# Patient Record
Sex: Female | Born: 1955
Health system: Southern US, Community
[De-identification: ages and names within clinical notes are randomized; demographics above are authoritative.]

## PROBLEM LIST (undated history)

## (undated) DIAGNOSIS — F329 Major depressive disorder, single episode, unspecified: Secondary | ICD-10-CM

## (undated) DIAGNOSIS — K219 Gastro-esophageal reflux disease without esophagitis: Secondary | ICD-10-CM

## (undated) DIAGNOSIS — I509 Heart failure, unspecified: Secondary | ICD-10-CM

## (undated) DIAGNOSIS — E785 Hyperlipidemia, unspecified: Secondary | ICD-10-CM

## (undated) DIAGNOSIS — G8929 Other chronic pain: Secondary | ICD-10-CM

## (undated) DIAGNOSIS — I2510 Atherosclerotic heart disease of native coronary artery without angina pectoris: Secondary | ICD-10-CM

## (undated) DIAGNOSIS — Z720 Tobacco use: Secondary | ICD-10-CM

## (undated) DIAGNOSIS — D649 Anemia, unspecified: Secondary | ICD-10-CM

## (undated) DIAGNOSIS — Z9981 Dependence on supplemental oxygen: Secondary | ICD-10-CM

## (undated) DIAGNOSIS — C55 Malignant neoplasm of uterus, part unspecified: Secondary | ICD-10-CM

## (undated) DIAGNOSIS — J40 Bronchitis, not specified as acute or chronic: Secondary | ICD-10-CM

## (undated) DIAGNOSIS — I429 Cardiomyopathy, unspecified: Secondary | ICD-10-CM

## (undated) DIAGNOSIS — R55 Syncope and collapse: Secondary | ICD-10-CM

## (undated) DIAGNOSIS — I739 Peripheral vascular disease, unspecified: Secondary | ICD-10-CM

## (undated) DIAGNOSIS — I10 Essential (primary) hypertension: Secondary | ICD-10-CM

## (undated) DIAGNOSIS — J189 Pneumonia, unspecified organism: Secondary | ICD-10-CM

## (undated) DIAGNOSIS — E039 Hypothyroidism, unspecified: Secondary | ICD-10-CM

## (undated) DIAGNOSIS — Z9889 Other specified postprocedural states: Secondary | ICD-10-CM

## (undated) DIAGNOSIS — F419 Anxiety disorder, unspecified: Secondary | ICD-10-CM

## (undated) DIAGNOSIS — G43909 Migraine, unspecified, not intractable, without status migrainosus: Secondary | ICD-10-CM

## (undated) DIAGNOSIS — J449 Chronic obstructive pulmonary disease, unspecified: Secondary | ICD-10-CM

## (undated) DIAGNOSIS — C801 Malignant (primary) neoplasm, unspecified: Secondary | ICD-10-CM

## (undated) DIAGNOSIS — F32A Depression, unspecified: Secondary | ICD-10-CM

## (undated) HISTORY — PX: NASAL SINUS SURGERY: SHX719

## (undated) HISTORY — DX: Depression, unspecified: F32.A

## (undated) HISTORY — DX: Syncope and collapse: R55

## (undated) HISTORY — DX: Tobacco use: Z72.0

## (undated) HISTORY — DX: Hyperlipidemia, unspecified: E78.5

## (undated) HISTORY — PX: CARPAL TUNNEL RELEASE: SHX101

## (undated) HISTORY — DX: Gastro-esophageal reflux disease without esophagitis: K21.9

## (undated) HISTORY — PX: DILATION AND CURETTAGE OF UTERUS: SHX78

## (undated) HISTORY — PX: LUMBAR LAMINECTOMY: SHX95

## (undated) HISTORY — PX: LAPAROTOMY: SHX154

## (undated) HISTORY — PX: BACK SURGERY: SHX140

## (undated) HISTORY — DX: Essential (primary) hypertension: I10

## (undated) HISTORY — DX: Anxiety disorder, unspecified: F41.9

## (undated) HISTORY — DX: Malignant neoplasm of uterus, part unspecified: C55

## (undated) HISTORY — DX: Other chronic pain: G89.29

## (undated) HISTORY — DX: Peripheral vascular disease, unspecified: I73.9

## (undated) HISTORY — DX: Cardiomyopathy, unspecified: I42.9

## (undated) HISTORY — DX: Other specified postprocedural states: Z98.890

## (undated) HISTORY — DX: Hypothyroidism, unspecified: E03.9

## (undated) HISTORY — DX: Anemia, unspecified: D64.9

## (undated) HISTORY — DX: Heart failure, unspecified: I50.9

## (undated) HISTORY — DX: Malignant (primary) neoplasm, unspecified: C80.1

## (undated) HISTORY — DX: Major depressive disorder, single episode, unspecified: F32.9

## (undated) HISTORY — DX: Migraine, unspecified, not intractable, without status migrainosus: G43.909

## (undated) HISTORY — PX: ABDOMINAL HYSTERECTOMY: SHX81

---
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
## 1997-09-01 ENCOUNTER — Ambulatory Visit (HOSPITAL_COMMUNITY): Admission: RE | Admit: 1997-09-01 | Discharge: 1997-09-01 | Payer: Self-pay | Admitting: Neurological Surgery

## 1997-09-08 ENCOUNTER — Ambulatory Visit (HOSPITAL_COMMUNITY): Admission: RE | Admit: 1997-09-08 | Discharge: 1997-09-08 | Payer: Self-pay | Admitting: Neurological Surgery

## 1997-09-22 ENCOUNTER — Encounter: Payer: Self-pay | Admitting: Neurological Surgery

## 1997-09-22 ENCOUNTER — Ambulatory Visit (HOSPITAL_COMMUNITY): Admission: RE | Admit: 1997-09-22 | Discharge: 1997-09-22 | Payer: Self-pay | Admitting: Neurological Surgery

## 1999-01-03 ENCOUNTER — Ambulatory Visit (HOSPITAL_COMMUNITY): Admission: RE | Admit: 1999-01-03 | Discharge: 1999-01-03 | Payer: Self-pay | Admitting: Neurological Surgery

## 1999-01-03 ENCOUNTER — Encounter: Payer: Self-pay | Admitting: Neurological Surgery

## 2000-09-03 ENCOUNTER — Ambulatory Visit (HOSPITAL_COMMUNITY): Admission: RE | Admit: 2000-09-03 | Discharge: 2000-09-03 | Payer: Self-pay | Admitting: Internal Medicine

## 2001-11-28 ENCOUNTER — Encounter: Payer: Self-pay | Admitting: Neurological Surgery

## 2001-11-28 ENCOUNTER — Ambulatory Visit (HOSPITAL_COMMUNITY): Admission: RE | Admit: 2001-11-28 | Discharge: 2001-11-28 | Payer: Self-pay | Admitting: Neurological Surgery

## 2002-10-13 ENCOUNTER — Ambulatory Visit (HOSPITAL_COMMUNITY): Admission: RE | Admit: 2002-10-13 | Discharge: 2002-10-13 | Payer: Self-pay | Admitting: Internal Medicine

## 2002-10-13 ENCOUNTER — Encounter: Payer: Self-pay | Admitting: Internal Medicine

## 2003-03-28 ENCOUNTER — Ambulatory Visit (HOSPITAL_COMMUNITY): Admission: RE | Admit: 2003-03-28 | Discharge: 2003-03-28 | Payer: Self-pay | Admitting: Internal Medicine

## 2003-04-27 ENCOUNTER — Encounter (HOSPITAL_COMMUNITY): Admission: RE | Admit: 2003-04-27 | Discharge: 2003-05-27 | Payer: Self-pay | Admitting: Orthopedic Surgery

## 2003-05-30 ENCOUNTER — Ambulatory Visit (HOSPITAL_COMMUNITY): Admission: RE | Admit: 2003-05-30 | Discharge: 2003-05-30 | Payer: Self-pay | Admitting: Orthopedic Surgery

## 2003-07-22 ENCOUNTER — Ambulatory Visit (HOSPITAL_COMMUNITY): Admission: RE | Admit: 2003-07-22 | Discharge: 2003-07-22 | Payer: Self-pay | Admitting: Anesthesiology

## 2003-08-24 ENCOUNTER — Ambulatory Visit (HOSPITAL_COMMUNITY): Admission: RE | Admit: 2003-08-24 | Discharge: 2003-08-24 | Payer: Self-pay | Admitting: Anesthesiology

## 2004-11-21 ENCOUNTER — Ambulatory Visit (HOSPITAL_COMMUNITY): Admission: RE | Admit: 2004-11-21 | Discharge: 2004-11-21 | Payer: Self-pay | Admitting: Internal Medicine

## 2005-01-02 ENCOUNTER — Ambulatory Visit: Payer: Self-pay | Admitting: Psychiatry

## 2005-01-14 ENCOUNTER — Ambulatory Visit: Payer: Self-pay | Admitting: Psychiatry

## 2005-02-04 HISTORY — PX: CHOLECYSTECTOMY: SHX55

## 2005-04-01 ENCOUNTER — Emergency Department (HOSPITAL_COMMUNITY): Admission: EM | Admit: 2005-04-01 | Discharge: 2005-04-01 | Payer: Self-pay | Admitting: Emergency Medicine

## 2005-04-09 ENCOUNTER — Ambulatory Visit (HOSPITAL_COMMUNITY): Payer: Self-pay | Admitting: Psychiatry

## 2005-08-15 ENCOUNTER — Emergency Department (HOSPITAL_COMMUNITY): Admission: EM | Admit: 2005-08-15 | Discharge: 2005-08-15 | Payer: Self-pay | Admitting: Emergency Medicine

## 2005-11-13 ENCOUNTER — Encounter (INDEPENDENT_AMBULATORY_CARE_PROVIDER_SITE_OTHER): Payer: Self-pay | Admitting: *Deleted

## 2005-11-13 ENCOUNTER — Observation Stay (HOSPITAL_COMMUNITY): Admission: RE | Admit: 2005-11-13 | Discharge: 2005-11-14 | Payer: Self-pay | Admitting: General Surgery

## 2006-01-30 ENCOUNTER — Ambulatory Visit: Payer: Self-pay | Admitting: Gastroenterology

## 2006-03-20 ENCOUNTER — Ambulatory Visit (HOSPITAL_COMMUNITY): Admission: RE | Admit: 2006-03-20 | Discharge: 2006-03-20 | Payer: Self-pay | Admitting: Gastroenterology

## 2006-11-03 ENCOUNTER — Ambulatory Visit (HOSPITAL_COMMUNITY): Admission: RE | Admit: 2006-11-03 | Discharge: 2006-11-03 | Payer: Self-pay | Admitting: Internal Medicine

## 2006-11-11 ENCOUNTER — Ambulatory Visit (HOSPITAL_COMMUNITY): Admission: RE | Admit: 2006-11-11 | Discharge: 2006-11-11 | Payer: Self-pay | Admitting: Neurological Surgery

## 2006-11-11 ENCOUNTER — Encounter (INDEPENDENT_AMBULATORY_CARE_PROVIDER_SITE_OTHER): Payer: Self-pay | Admitting: Neurological Surgery

## 2006-11-11 ENCOUNTER — Ambulatory Visit: Payer: Self-pay | Admitting: Vascular Surgery

## 2006-11-18 ENCOUNTER — Emergency Department (HOSPITAL_COMMUNITY): Admission: EM | Admit: 2006-11-18 | Discharge: 2006-11-18 | Payer: Self-pay | Admitting: Emergency Medicine

## 2007-06-02 ENCOUNTER — Other Ambulatory Visit: Payer: Self-pay

## 2007-06-02 ENCOUNTER — Ambulatory Visit: Payer: Self-pay | Admitting: *Deleted

## 2007-06-02 ENCOUNTER — Inpatient Hospital Stay (HOSPITAL_COMMUNITY): Admission: RE | Admit: 2007-06-02 | Discharge: 2007-06-05 | Payer: Self-pay | Admitting: Psychiatry

## 2007-06-19 ENCOUNTER — Ambulatory Visit (HOSPITAL_COMMUNITY): Payer: Self-pay | Admitting: Endocrinology

## 2007-06-19 ENCOUNTER — Encounter (HOSPITAL_COMMUNITY): Admission: RE | Admit: 2007-06-19 | Discharge: 2007-07-19 | Payer: Self-pay | Admitting: Oncology

## 2007-09-02 ENCOUNTER — Ambulatory Visit (HOSPITAL_COMMUNITY): Admission: RE | Admit: 2007-09-02 | Discharge: 2007-09-02 | Payer: Self-pay | Admitting: Internal Medicine

## 2007-12-18 ENCOUNTER — Ambulatory Visit (HOSPITAL_COMMUNITY): Admission: RE | Admit: 2007-12-18 | Discharge: 2007-12-18 | Payer: Self-pay | Admitting: Internal Medicine

## 2008-03-23 ENCOUNTER — Ambulatory Visit (HOSPITAL_COMMUNITY): Admission: RE | Admit: 2008-03-23 | Discharge: 2008-03-23 | Payer: Self-pay | Admitting: Internal Medicine

## 2008-08-14 ENCOUNTER — Emergency Department (HOSPITAL_COMMUNITY): Admission: EM | Admit: 2008-08-14 | Discharge: 2008-08-14 | Payer: Self-pay | Admitting: Emergency Medicine

## 2008-08-23 ENCOUNTER — Emergency Department (HOSPITAL_COMMUNITY): Admission: EM | Admit: 2008-08-23 | Discharge: 2008-08-23 | Payer: Self-pay | Admitting: Emergency Medicine

## 2008-10-03 ENCOUNTER — Emergency Department (HOSPITAL_COMMUNITY): Admission: EM | Admit: 2008-10-03 | Discharge: 2008-10-03 | Payer: Self-pay | Admitting: Emergency Medicine

## 2010-04-20 ENCOUNTER — Ambulatory Visit: Payer: Self-pay | Admitting: Cardiology

## 2010-04-20 ENCOUNTER — Encounter: Payer: Self-pay | Admitting: Cardiology

## 2010-05-12 LAB — DIFFERENTIAL
Basophils Absolute: 0.1 10*3/uL (ref 0.0–0.1)
Basophils Relative: 1 % (ref 0–1)
Eosinophils Absolute: 0.2 10*3/uL (ref 0.0–0.7)
Eosinophils Relative: 2 % (ref 0–5)
Lymphocytes Relative: 24 % (ref 12–46)
Lymphs Abs: 2.5 10*3/uL (ref 0.7–4.0)
Monocytes Absolute: 0.9 10*3/uL (ref 0.1–1.0)
Monocytes Relative: 9 % (ref 3–12)
Neutro Abs: 6.8 10*3/uL (ref 1.7–7.7)
Neutrophils Relative %: 65 % (ref 43–77)

## 2010-05-12 LAB — URINALYSIS, ROUTINE W REFLEX MICROSCOPIC
Bilirubin Urine: NEGATIVE
Hgb urine dipstick: NEGATIVE
Ketones, ur: NEGATIVE mg/dL
Nitrite: NEGATIVE
Protein, ur: NEGATIVE mg/dL
Specific Gravity, Urine: 1.015 (ref 1.005–1.030)
Urobilinogen, UA: 0.2 mg/dL (ref 0.0–1.0)
pH: 6 (ref 5.0–8.0)

## 2010-05-12 LAB — BASIC METABOLIC PANEL
BUN: 8 mg/dL (ref 6–23)
CO2: 32 mEq/L (ref 19–32)
Calcium: 8.6 mg/dL (ref 8.4–10.5)
Chloride: 98 mEq/L (ref 96–112)
Creatinine, Ser: 0.9 mg/dL (ref 0.4–1.2)
GFR calc Af Amer: >60 mL/min (ref >60)
GFR calc non Af Amer: >60 mL/min (ref >60)
Glucose, Bld: 92 mg/dL (ref 70–99)
Potassium: 3.5 mEq/L (ref 3.5–5.1)
Sodium: 137 mEq/L (ref 135–145)

## 2010-05-12 LAB — RAPID URINE DRUG SCREEN, HOSP PERFORMED
Amphetamines: NOT DETECTED
Barbiturates: NOT DETECTED
Benzodiazepines: POSITIVE — AB
Cocaine: NOT DETECTED
Opiates: POSITIVE — AB
Tetrahydrocannabinol: NOT DETECTED

## 2010-05-12 LAB — PROTIME-INR
INR: 1 (ref 0.00–1.49)
Prothrombin Time: 13.1 seconds (ref 11.6–15.2)

## 2010-05-12 LAB — CBC
HCT: 33.2 % — ABNORMAL LOW (ref 36.0–46.0)
Hemoglobin: 11.8 g/dL — ABNORMAL LOW (ref 12.0–15.0)
MCHC: 35.5 g/dL (ref 30.0–36.0)
MCV: 93.3 fL (ref 78.0–100.0)
Platelets: 290 10*3/uL (ref 150–400)
RBC: 3.56 MIL/uL — ABNORMAL LOW (ref 3.87–5.11)
RDW: 12.8 % (ref 11.5–15.5)
WBC: 10.4 10*3/uL (ref 4.0–10.5)

## 2010-05-12 LAB — ETHANOL: Alcohol, Ethyl (B): <5 mg/dL (ref 0–10)

## 2010-05-13 LAB — RAPID URINE DRUG SCREEN, HOSP PERFORMED
Amphetamines: NOT DETECTED
Barbiturates: NOT DETECTED
Benzodiazepines: POSITIVE — AB
Cocaine: NOT DETECTED
Opiates: NOT DETECTED
Tetrahydrocannabinol: NOT DETECTED

## 2010-05-13 LAB — URINALYSIS, ROUTINE W REFLEX MICROSCOPIC
Bilirubin Urine: NEGATIVE
Glucose, UA: NEGATIVE mg/dL
Hgb urine dipstick: NEGATIVE
Ketones, ur: NEGATIVE mg/dL
Nitrite: NEGATIVE
Protein, ur: NEGATIVE mg/dL
Specific Gravity, Urine: 1.01 (ref 1.005–1.030)
pH: 5.5 (ref 5.0–8.0)

## 2010-05-13 LAB — DIFFERENTIAL
Basophils Absolute: 0.1 10*3/uL (ref 0.0–0.1)
Basophils Relative: 1 % (ref 0–1)
Eosinophils Absolute: 0.1 10*3/uL (ref 0.0–0.7)
Eosinophils Relative: 0 % (ref 0–5)
Lymphocytes Relative: 35 % (ref 12–46)
Lymphs Abs: 4.9 10*3/uL — ABNORMAL HIGH (ref 0.7–4.0)
Monocytes Absolute: 1.4 10*3/uL — ABNORMAL HIGH (ref 0.1–1.0)
Monocytes Relative: 10 % (ref 3–12)
Neutro Abs: 7.8 10*3/uL — ABNORMAL HIGH (ref 1.7–7.7)
Neutrophils Relative %: 55 % (ref 43–77)

## 2010-05-13 LAB — BASIC METABOLIC PANEL
BUN: 5 mg/dL — ABNORMAL LOW (ref 6–23)
CO2: 33 mEq/L — ABNORMAL HIGH (ref 19–32)
Calcium: 9.4 mg/dL (ref 8.4–10.5)
Chloride: 100 mEq/L (ref 96–112)
Creatinine, Ser: 0.9 mg/dL (ref 0.4–1.2)
GFR calc Af Amer: >60 mL/min (ref >60)
GFR calc non Af Amer: >60 mL/min (ref >60)
Glucose, Bld: 87 mg/dL (ref 70–99)
Potassium: 3.9 mEq/L (ref 3.5–5.1)
Sodium: 139 mEq/L (ref 135–145)

## 2010-05-13 LAB — CBC
HCT: 36.5 % (ref 36.0–46.0)
Hemoglobin: 12.8 g/dL (ref 12.0–15.0)
MCHC: 35 g/dL (ref 30.0–36.0)
MCV: 93.9 fL (ref 78.0–100.0)
Platelets: 350 10*3/uL (ref 150–400)
RBC: 3.89 MIL/uL (ref 3.87–5.11)
RDW: 14 % (ref 11.5–15.5)
WBC: 14.3 10*3/uL — ABNORMAL HIGH (ref 4.0–10.5)

## 2010-05-13 LAB — ETHANOL: Alcohol, Ethyl (B): <5 mg/dL (ref 0–10)

## 2010-05-14 ENCOUNTER — Ambulatory Visit (INDEPENDENT_AMBULATORY_CARE_PROVIDER_SITE_OTHER): Payer: Medicare Other | Admitting: Cardiology

## 2010-05-14 ENCOUNTER — Encounter: Payer: Self-pay | Admitting: *Deleted

## 2010-05-14 ENCOUNTER — Encounter: Payer: Self-pay | Admitting: Cardiology

## 2010-05-14 ENCOUNTER — Other Ambulatory Visit: Payer: Self-pay | Admitting: Cardiology

## 2010-05-14 DIAGNOSIS — Z720 Tobacco use: Secondary | ICD-10-CM

## 2010-05-14 DIAGNOSIS — R0989 Other specified symptoms and signs involving the circulatory and respiratory systems: Secondary | ICD-10-CM

## 2010-05-14 DIAGNOSIS — I700 Atherosclerosis of aorta: Secondary | ICD-10-CM

## 2010-05-14 DIAGNOSIS — I10 Essential (primary) hypertension: Secondary | ICD-10-CM

## 2010-05-14 DIAGNOSIS — E039 Hypothyroidism, unspecified: Secondary | ICD-10-CM

## 2010-05-14 DIAGNOSIS — E785 Hyperlipidemia, unspecified: Secondary | ICD-10-CM

## 2010-05-14 DIAGNOSIS — R55 Syncope and collapse: Secondary | ICD-10-CM | POA: Insufficient documentation

## 2010-05-14 DIAGNOSIS — F17210 Nicotine dependence, cigarettes, uncomplicated: Secondary | ICD-10-CM | POA: Insufficient documentation

## 2010-05-14 MED ORDER — FLUOXETINE HCL 20 MG PO TABS: 20.0000 mg | ORAL_TABLET | Freq: Every day | ORAL | Status: DC

## 2010-05-14 NOTE — Patient Instructions (Addendum)
Your physician recommends that you schedule a follow-up appointment in:1 month  Your physician has requested that you have a carotid duplex. This test is an ultrasound of the carotid arteries in your neck. It looks at blood flow through these arteries that supply the brain with blood. Allow one hour for this exam. There are no restrictions or special instructions.  Your physician has requested that you have an abdominal aorta duplex. During this test, an ultrasound is used to evaluate the aorta. Allow 30 minutes for this exam. Do not eat after midnight the day before and avoid carbonated beverages  Your physician recommends that you return for lab work in: next 1 month Your physician has recommended that you wear an event monitor. Event monitors are medical devices that record the heart's electrical activity. Doctors most often [place] these monitors to diagnose arrhythmias. Arrhythmias are problems with the speed or rhythm of the heartbeat. The monitor is a small, portable device. You can wear one while you do your normal daily activities. This is usually used to diagnose what is causing palpitations/syncope (passing out).   Your physician has recommended you make the following change in your medication: decrease prozac to 20mg  daily

## 2010-05-14 NOTE — Progress Notes (Signed)
HPI:  Ms. Seiler is seen at the kind request of Dr. Sherwood Gambler for evaluation of apparent episodes of syncope.  This nice woman has suffered from anxiety and depression for quite some time and was followed by a psychiatrist, but has not been seen by a behavioral health specialist for some time.  She experiences sudden falls with apparent loss of consciousness, typically without warning, although there is a suggestion of dizziness, at least some of the time.  She has not yet sustained significant injury.  Episodes occur on the average of perhaps once per week.  She associates spells with concomitant "panic attacks" and dates the onset as around the time of the death of her mother one year ago.  She has no known cardiovascular disease and does not recall any significant past cardiovascular testing.  Current Outpatient Prescriptions on File Prior to Visit  Medication Sig Dispense Refill  . ALPRAZolam (XANAX) 1 MG tablet Take 1 mg by mouth 4 (four) times daily.        Marland Kitchen levothyroxine (LEVOTHROID) 25 MCG tablet Take 25 mcg by mouth daily.        . Magnesium 250 MG TABS Take 1 tablet by mouth daily.        Marland Kitchen oxycodone (OXYCONTIN) 30 MG TB12 Take 30 mg by mouth every 12 (twelve) hours.        Marland Kitchen DISCONTD: FLUoxetine (PROZAC) 20 MG tablet Take 20 mg by mouth daily.       Marland Kitchen DISCONTD: mirtazapine (REMERON) 15 MG tablet Take 15 mg by mouth at bedtime.          Allergies  Allergen Reactions  . Aspirin   . Celecoxib   . Latex   . Tylenol-Codeine       Past Medical History  Diagnosis Date  . Near syncope   . Anxiety and depression   . Hyperlipidemia   . Hypertension   . Hypothyroid      Past Surgical History  Procedure Date  . Cholecystectomy 2007  . Abdominal hysterectomy   . Nasal sinus surgery   . Dilation and curettage of uterus   . Lumbar laminectomy x3    Left; complicated by neurologic dysfunction  . Laparotomy      History reviewed. No pertinent family history.   History   Social  History  . Marital Status: Legally Separated    Spouse Name: N/A    Number of Children: 2  . Years of Education: N/A   Occupational History  . Not on file.   Social History Main Topics  . Smoking status: Current Everyday Smoker  . Smokeless tobacco: Never Used  . Alcohol Use: No  . Drug Use: No  . Sexually Active:    Other Topics Concern  . Not on file   Social History Narrative  . No narrative on file     ROS:  Frequent and intermittently severe headaches; corrective lenses required for near vision; upper and lower dentures; intermittent mild palpitations; diffuse arthritic discomfort; intermittent mild pedal edema.  All other systems reviewed and are negative.  PHYSICAL EXAM: BP 89/59  Pulse 74  Ht 5\' 2"  (1.575 m)  Wt 131 lb (59.421 kg)  BMI 23.96 kg/m2  SpO2 94%  No consistent orthostatic change in blood pressure General-Well-developed; no acute distress HEENT-[place]/AT; PERRL; EOM intact; conjunctiva and lids nl Neck-No JVD; no carotid bruits Endocrine-No thyromegaly Lungs-No tachypnea, clear without rales, rhonchi or wheezes Cardiovascular- normal PMI; normal S1 and S2 Abdomen-BS normal; soft and  non-tender without masses or organomegaly Musculoskeletal-No deformities, cyanosis or clubbing Neurologic-Tremulous; nl cranial nerves; symmetric strength and tone Skin- Warm, no sig. Lesions Extremities-Nl distal pulses; no edema  EKG:   Normal sinus rhythm; delayed R wave progression-cannot exclude prior anteroseptal MI; no previous tracing for comparison.  ASSESSMENT AND PLAN:

## 2010-05-14 NOTE — Assessment & Plan Note (Signed)
No recent lipid profile is available for review; one will be ordered.

## 2010-05-14 NOTE — Assessment & Plan Note (Signed)
Association of episodes of apparent loss of consciousness with episodes of severe anxiety including palpitations raises the question of a psychogenic cause, although arrhythmia is certainly possible.  Blood pressure is quite low at this visit, an additional possible etiology.  Dose of fluoxetine will be decreased as noted above with reassessment orthostatic blood pressures and 3 days.  Patient will carry an event recorder for 3 weeks and return to see me in one month.

## 2010-05-14 NOTE — Assessment & Plan Note (Signed)
Despite a history of hypertension, blood pressure is on the low side in the absence of antihypertensive medication.  Fluoxetine can cause orthostatic hypotension; dosage will be reduced to 20 mg q.d. For the time being.

## 2010-05-14 NOTE — Assessment & Plan Note (Signed)
Patient advised to discontinue cigarette smoking completely.

## 2010-05-21 ENCOUNTER — Ambulatory Visit (INDEPENDENT_AMBULATORY_CARE_PROVIDER_SITE_OTHER): Payer: Medicare Other

## 2010-05-21 ENCOUNTER — Ambulatory Visit (HOSPITAL_COMMUNITY)
Admission: RE | Admit: 2010-05-21 | Discharge: 2010-05-21 | Disposition: A | Discharge status: Home / Self Care | Payer: Medicare Other | Source: Ambulatory Visit | Attending: Cardiology | Admitting: Cardiology

## 2010-05-21 DIAGNOSIS — R55 Syncope and collapse: Secondary | ICD-10-CM

## 2010-05-21 DIAGNOSIS — R0989 Other specified symptoms and signs involving the circulatory and respiratory systems: Secondary | ICD-10-CM

## 2010-05-21 DIAGNOSIS — E785 Hyperlipidemia, unspecified: Secondary | ICD-10-CM

## 2010-05-21 DIAGNOSIS — F17200 Nicotine dependence, unspecified, uncomplicated: Secondary | ICD-10-CM | POA: Insufficient documentation

## 2010-05-21 DIAGNOSIS — R42 Dizziness and giddiness: Secondary | ICD-10-CM | POA: Insufficient documentation

## 2010-05-21 NOTE — Progress Notes (Signed)
**Note De-Identified  Obfuscation** S: Pt. arrives in office for a 1 week BP check/nurse visit B: On last OV with Dr. Dietrich Pates on 05-14-10 pt. was advised to decrease Fluoxetine from 80mg   to 20mg  daily, to have a carotid U.S.,  Abdominal Aorta duplex and wear event monitor for 21 days. A: Pt. decreased dose of Fluoxetine to 40mg  (not 20mg  as advised). Her BP today is 92/64, at last OV BP = 89/59. She c/o dizziness (orthostatic BP recorded in chart).  R: Pt. Advised to decrease Fluoxetine to 20mg  daily and that we will call her with Dr. Marvel Plan recommendations, if any.  05/26/10 Recheck BP and symptoms in 2 weeks--no charge.  Solano Bing, M.D.

## 2010-05-24 DIAGNOSIS — R55 Syncope and collapse: Secondary | ICD-10-CM

## 2010-05-27 ENCOUNTER — Encounter: Payer: Self-pay | Admitting: *Deleted

## 2010-05-28 ENCOUNTER — Telehealth: Payer: Self-pay

## 2010-05-28 NOTE — Progress Notes (Signed)
Pt. has f/u appt. scheduled with Dr. Dietrich Pates on 06-13-10 @ 2:45. She states that if she has to come to town for any reason between now and then she will stop by office to have BP checked.

## 2010-05-30 ENCOUNTER — Encounter: Payer: Self-pay | Admitting: *Deleted

## 2010-06-06 ENCOUNTER — Telehealth: Payer: Self-pay | Admitting: Cardiology

## 2010-06-06 NOTE — Telephone Encounter (Signed)
Patient states that she is allergic to adhesive on patches for event monitor / states that she was advised by [employer] to take monitor off and call nurse Knute Neu

## 2010-06-06 NOTE — Telephone Encounter (Addendum)
S: pt is having allergic reaction to the patches, advised by [employer] to call our office for advise      Pt was given the sensitive skin patches but it did not improve. B:pt was baselined on 05/24/10 for a 21 day mcot monitor dx: syncope A: tracing are in your box R: Per Dr. Dietrich Pates, pt may discontinue heart  monitor, we have enough tracing for him to make a dx  Spoke with pt to return monitor, cardiology has enough tracing.

## 2010-06-13 ENCOUNTER — Other Ambulatory Visit: Payer: Self-pay | Admitting: Cardiology

## 2010-06-13 ENCOUNTER — Ambulatory Visit: Payer: Medicare Other | Admitting: Cardiology

## 2010-06-13 DIAGNOSIS — R52 Pain, unspecified: Secondary | ICD-10-CM

## 2010-06-13 DIAGNOSIS — R0989 Other specified symptoms and signs involving the circulatory and respiratory systems: Secondary | ICD-10-CM

## 2010-06-19 ENCOUNTER — Ambulatory Visit (HOSPITAL_COMMUNITY)
Admission: RE | Admit: 2010-06-19 | Discharge: 2010-06-19 | Payer: Medicare Other | Source: Ambulatory Visit | Attending: Cardiology | Admitting: Cardiology

## 2010-06-19 NOTE — Discharge Summary (Signed)
[name], [name]             ACCOUNT NO.:  [id number]   MEDICAL RECORD NO.:  [id number]          PATIENT TYPE:  IPS   LOCATION:  0301                          FACILITY:  [facility]   PHYSICIAN:  Jasmine Pang, M.D. DATE OF BIRTH:  04-Jan-1956   DATE OF ADMISSION:  06/02/2007  DATE OF DISCHARGE:  06/05/2007                               DISCHARGE SUMMARY   IDENTIFICATION:  This is the 55 year old married  and separated white  female who was admitted on a voluntary basis.   HISTORY OF PRESENT ILLNESS:  The patient presented with suicidal  thoughts of possibly running into a tree with her car.  She had fleeting  thoughts of harming her adult daughter.  She has chronic conflict with  her daughter and husband.  She denies actual intent to harm herself or  anyone else.  Her appetite has been decreased for the past 3 weeks.  Her  sleep has been poor.  Thoughts are racing.   PAST PSYCHIATRIC HISTORY:  The patient sees Dr. Delight Smith of the [facility].  This is her first [facility] admission for the  patient.  She has a history of an OD at age 35 on pills.   MEDICAL HISTORY:  History of multiple back surgeries.   MEDICATIONS:  1. Alprazolam 1 mg q.i.d.  2. Fluoxetine 40 mg q.a.m.  3. Albuterol 2 mg p.o. t.i.d.  4. Oxycodone 30 mg q.i.d.  5. Levothyroxine 25 mcg daily.  6. Nexium 20 mg daily.   DRUG ALLERGIES:  No known drug allergies.   PHYSICAL FINDINGS:  Physical exam was done in the emergency department.  There were no acute physical or medical problems noted.   ADMISSION LABORATORIES:  The CBC was grossly within normal limits.  Chemistry panel was within normal limits with a calcium of 9.7.  Alcohol  level was less than 5.  UDS was positive for benzodiazepines.   HOSPITAL COURSE:  Upon admission, the patient was restarted on  fluoxetine 40 mg daily, albuterol sulfate 2 mg p.o. t.i.d., oxycodone 30  mg p.o. q.i.d. p.r.n., levothyroxine 25 mcg p.o. q.day,  omeprazole 20 mg  p.o. q.day.  The patient was also started on alprazolam 1 mg p.o. q.i.d.  p.r.n. anxiety.  The patient had a rash on chest, which was treated with  some Benadryl 50 mg and triamcinolone 1% cream b.i.d.  The patient  tolerated her medications well with no significant side effects.  In  individual sessions with me, the patient was reserved, but cooperative.  Her speech was soft and slow.  There was psychomotor retardation.  She  was separated from her husband who is a substance abuser.  There is  conflict with her daughter.  She did participate appropriately in unit  therapeutic groups and activities.  As hospitalization progressed, she  became less depressed and less anxious.  She began to anticipate  discharge.  On Jun 05, 2007, mental status had improved markedly from  admission status.  Sleep was good.  Appetite was good.  Mood was less  depressed and less anxious.  Affect consistent with mood.  There was no  suicidal or homicidal ideation.  No auditory or visual hallucinations.  No paranoia or delusions.  Thoughts were logical and goal-directed.  Thought content, no predominant theme.  Cognitive was grossly back to  baseline.  She was felt to be ready for discharge and safe to leave the  hospital.   DISCHARGE DIAGNOSES:  Axis I:  Major depression, recurrent severe.  Axis II:  None.  Axis III:  Rash on chest, not otherwise specified.  Asthma and  gastroesophageal reflux disease.  Axis IV:  Moderate (chronic marital discord and burden of psychiatric  illness).  Axis V:  Global assessment of functioning was 55 upon discharge.  GAF  was 49 upon admission.  GAF highest past year was 97.   DISCHARGE PLANS:  There was no specific activity level or dietary  restrictions.   POSTHOSPITAL CARE PLANS:  The patient will go to the [facility] on Jun 17, 2007, at 2 p.m.   DISCHARGE MEDICATIONS:  1. Benadryl 25 mg use according to package directions  for itching from      rash on chest.  2. Fluoxetine 40 mg daily.  3. Albuterol 2 mg take tablets 3 times a day.  4. Omeprazole 20 mg daily.  5. Levothyroxine 25 mcg daily.  6. Oxycodone 30 mg 4 times a day as needed for pain.  7. Alprazolam 1 mg up to 4 times a day, if needed for anxiety.  8. Triamcinolone cream twice daily to rash.   The followup will be with her primary care doctor for medical problems.      Jasmine Pang, M.D.  Electronically Signed     BHS/MEDQ  D:  07/08/2007  T:  07/09/2007  Job:  [id number]

## 2010-06-20 LAB — LIPID PANEL
Cholesterol: 254 mg/dL — ABNORMAL HIGH (ref 0–200)
HDL: 66 mg/dL (ref >39)
LDL Cholesterol: 160 mg/dL — ABNORMAL HIGH (ref 0–99)
Triglycerides: 142 mg/dL (ref <150)
VLDL: 28 mg/dL (ref 0–40)

## 2010-06-22 ENCOUNTER — Ambulatory Visit (HOSPITAL_COMMUNITY)
Admission: RE | Admit: 2010-06-22 | Discharge: 2010-06-22 | Disposition: A | Discharge status: Home / Self Care | Payer: Medicare Other | Source: Ambulatory Visit | Attending: Cardiology | Admitting: Cardiology

## 2010-06-22 ENCOUNTER — Other Ambulatory Visit: Payer: Self-pay | Admitting: Cardiology

## 2010-06-22 ENCOUNTER — Ambulatory Visit (HOSPITAL_COMMUNITY): Admission: RE | Admit: 2010-06-22 | Payer: Medicare Other | Source: Ambulatory Visit

## 2010-06-22 DIAGNOSIS — I714 Abdominal aortic aneurysm, without rupture, unspecified: Secondary | ICD-10-CM

## 2010-06-22 NOTE — Op Note (Signed)
[name], [name]             ACCOUNT NO.:  [id number]   MEDICAL RECORD NO.:  [id number]          PATIENT TYPE:  OBV   LOCATION:  A339                          FACILITY:  [facility]   PHYSICIAN:  Barbaraann Barthel, M.D. DATE OF BIRTH:  [birth year]   DATE OF PROCEDURE:  11/13/2005  DATE OF DISCHARGE:  11/14/2005                                 OPERATIVE REPORT   NOTE:  This is a 55 year old white female who was admitted through the  outpatient department for an elective laparoscopic cholecystectomy.  She had  presented with right upper quadrant discomfort with some nausea and  vomiting.  No real right upper quadrant pain. The sonogram revealed the  polyp within the gallbladder possibly a stone. We discussed surgery with  her. She had undergone upper GI endoscopy in 2000 and she had other  complaints for chronic pain.  We discussed the need for a laparoscopic  gallbladder with her to rule out the stone and also this finding of polyp  within her gallbladder.  We discussed complications not limited to but  including bleeding, infection, damage to bile duct, perforation of organs  and transitory diarrhea.  Informed consent was obtained.   GROSS OPERATIVE FINDINGS:  Minimal adhesions about the gallbladder, a small  cystic duct which was not cannulated.  No other abnormalities in the right  upper quadrant.   Gallbladder was the specimen.   TECHNIQUE:  The patient was placed in supine position.  We positioned her  legs for comfort as this patient has some radiculopathy from previous back  surgeries.  We placed a Foley aseptically and then after prepping with  Betadine solution and draping in the usual manner.  A periumbilical incision  was carried out over the superior aspect of the umbilicus and with the  patient in Trendelenburg the fascia was grasped with a sharp towel clip and  Veress needle was inserted and confirmed in position with a saline drop  test.  We then insufflated the abdomen  with approximately 3.5 liters of CO2  and then placed 11 mm cannula using the Visiport technique in the area of  the umbilicus and then under direct vision 11 mm cannula was placed in the  epigastrium and two 5-mm cannulas in the right upper quadrant laterally.  The gallbladder was grasped.  Adhesions were taken down. The cystic artery  and cystic duct were clearly visualized.  These were triply silver clipped  and divided. The gallbladder was then removed uneventfully from the liver  bed using the cautery device.  I irrigated with normal saline solution.  After removing the gallbladder through with the Endo sac device and elected  to leave a Jackson-Pratt drain and Surgicel on the liver bed.  The abdomen  was then desufflated.  Prior to closure all sponge, needle, instrument  counts found to be correct.  Estimated blood loss was minimal.  The patient  received 900 mL of crystalloids intraoperatively and there were no  complications.  The fascia was closed in the area of the epigastrium and the  umbilicus with an 0 Polysorb and I used approximately 18 mL  of 0.5%  Sensorcaine around all port sites to add to postoperative comfort.  The  drain was  sutured in place with 3-0 nylon.  Prior to closure all sponge, needle,  instrument counts found to be correct.  Estimated blood loss was minimal.  The patient received 900 mL of crystalloids intraoperatively.  There were no  complications.      Barbaraann Barthel, M.D.  Electronically Signed     WB/MEDQ  D:  11/13/2005  T:  11/14/2005  Job:  [id number]   cc:   Madelin Rear. Sherwood Gambler, MD  Fax: [phone]

## 2010-06-22 NOTE — Consult Note (Signed)
[name], [name]             ACCOUNT NO.:  [id number]   MEDICAL RECORD NO.:  [id number]            PATIENT TYPE:   LOCATION:                                 FACILITY:   PHYSICIAN:  Kassie Mends, M.D.      DATE OF BIRTH:  17-Apr-1955   DATE OF CONSULTATION:  01/30/2006  DATE OF DISCHARGE:                                 CONSULTATION   REFERRING PHYSICIAN:  Dr. Sherwood Gambler.   REASON FOR VISIT:  Rectal bleeding and diarrhea.   HISTORY OF PRESENT ILLNESS:  [name] is a 55 year old female with a  significant past surgical history of a cholecystectomy 2-1/2 months ago.  Cholecystectomy was performed because she was having persistent vomiting  and was found to have a poorly functioning gallbladder.  She states that  since her gallbladder was taken out 2-1/2 months ago she periodically  has explosive diarrhea.  She has no ability to predict when she has the  diarrhea.  She does occasionally see blood when she wipes on the tissue.  Prior to her gallbladder being taken out she used to have problems with  constipation and required stool softeners because she is on high doses  of narcotics for her back pain.  She has diarrhea once every 2-3 weeks.  Sometimes she may have diarrhea twice a week.  It is watery and she has  a big blowout.  It may last up to 2-3 days.  She denies any fever.  She is not lightheaded or dizzy.  She denies any labs drawn, but she is  supposed to have them drawn on tomorrow, January 31, 2006.  She bakes a  lot, but her husband prefers fried food.  Sometimes her episodes are  triggered by fried foods.  She occasionally drinks a cold glass of milk,  but denies any significant consumption of cheese or ice cream.  A  typical episode occurs when she gets a sharp abdominal pain and then she  has to go to the bathroom.  She gets a big whoosh and then watery  diarrhea.  She may have to keep going back to the bathroom back and  forth for 20-30 minutes or she may remain on  the toilet for 20-30  minutes.  She denies any nausea, vomiting, difficulty swallowing.  She  does not have well water.  She has not been on any antibiotics.  She may  still have problems with constipation once twice a month.  She reports  an unintentional 7 pound weight loss.   PAST MEDICAL HISTORY:  1..  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  1. Anxiety and panic attacks.  2. Acid reflux.  3. Depression.  4. Sleep apnea requiring CPAP.  5. Asthma.   PAST SURGICAL HISTORY:  1. Back surgery x3.  2. Hysterectomy.  3. Sinus surgery.  4. D and C.  5. Exploratory surgery.   ALLERGIES:  ASPIRIN, CELEBREX, TYLENOL #3, ANTIBIOTIC CREAM, AND LATEX.   MEDICATIONS:  1. Oxycodone 30 mg one every 4 hours as needed.  2. Xanax 1 mg one 4x a day as needed, usually  2 mg at night time 4      days out of the week.  3. Nexium 40 mg twice a day most days of the week.  4. Effexor XR 75 mg twice a day.  5. Albuterol 2 mg 3x a day as needed.  6. Black cohosh.  7. B-12, 500 mcg, two daily.  8. Diphenhydramine 25 mg tablets, two q.h.s. most days of the week.   FAMILY HISTORY:  She has no family history of colon cancer.  Her mother  had colon polyps at an unknown age.  She is currently 72.   SOCIAL HISTORY:  She is married and has two children.  She has been  undergoing a lot of psychosocial stressors because her husband was  addicted to pain killers.  Her marriage was under great deal of stress  because of that.  She also had a very close relative who died because of  his being on methadone.  She has a grandchild who may go and live with  her daughter-in-law's parents loss who may have a questionable social  environment.  She smokes, but does not drink any alcohol.  She is  disabled.   REVIEW OF SYSTEMS:  Per the HPI, otherwise all systems are negative.   PHYSICAL EXAMINATION:  Weight 140.5 pounds, height 5 feet 2 inches, BMI  25.6 (slightly overweight), temperature 98.3, blood pressure  142/60,  pulse 84.  GENERAL:  She is in no apparent distress, alert and oriented  x4.  HEENT:  Atraumatic, normocephalic.  Pupils equal and react to  light.  Mouth:  No oral lesions.  Posterior pharynx without erythema or  exudate.  NECK:  Full range of motion.  No lymphadenopathy.  LUNGS:  Clear to auscultation bilaterally.  CARDIOVASCULAR:  Shows regular  rhythm, no murmur.  Normal S1 and S2.  ABDOMEN:  Bowel sounds are  present, soft, nontender, nondistended.  No rebound or guarding.  She  has a right renal bruit.  No hepatosplenomegaly.  EXTREMITIES:  Have no  cyanosis, clubbing or edema.  No femoral bruit.  NEUROLOGIC:  No focal neurologic deficits.   ASSESSMENT:  [name] is a 55 year old female with chronic watery  diarrhea with blood on the tissue when she wipes.  She reports a 7 pound  weight loss over the last month but from her notes she has had an 8  pound weight gain in the last 10 days.  The most likely cause for her  diarrhea is irritable bowel with a post cholecystectomy component.  The  occasional blood that she sees on the tissue may be due to hemorrhoids  or a colorectal polyp.  She has a low likelihood of having inflammatory  bowel disease.   Thank you for allowing me to see Ms. Lovelace in consultation.  My  recommendations follow.   RECOMMENDATIONS:  1. [name] should be scheduled for a colonoscopy.  I will      schedule her with propofol because of her chronic use of high dose      of oxycodone and Xanax.  2. I have asked her to chew two Tums if she is going to consume a      fatty meal.  This will help alleviate bile salt      induced diarrhea.  3. She has a return patient visit to see me in 6 weeks.  If her      symptoms are not improved with the Tums then we may discuss the  addition of fiber and an anti cholinergic.      Kassie Mends, M.D.  Electronically Signed    SM/MEDQ  D:  01/30/2006  T:  01/31/2006  Job:  [id number]   cc:    Madelin Rear. Sherwood Gambler, MD  Fax: [phone]

## 2010-06-25 ENCOUNTER — Encounter: Payer: Self-pay | Admitting: *Deleted

## 2010-06-29 DIAGNOSIS — F329 Major depressive disorder, single episode, unspecified: Secondary | ICD-10-CM

## 2010-06-29 DIAGNOSIS — F419 Anxiety disorder, unspecified: Secondary | ICD-10-CM | POA: Insufficient documentation

## 2010-07-04 ENCOUNTER — Encounter: Payer: Medicare Other | Admitting: Cardiology

## 2010-07-04 ENCOUNTER — Encounter: Payer: Self-pay | Admitting: Cardiology

## 2010-07-12 NOTE — Progress Notes (Signed)
This encounter was created in error - please disregard.

## 2010-07-16 ENCOUNTER — Encounter: Payer: Self-pay | Admitting: Cardiology

## 2010-07-24 ENCOUNTER — Encounter: Payer: Self-pay | Admitting: Cardiology

## 2010-07-24 ENCOUNTER — Ambulatory Visit (INDEPENDENT_AMBULATORY_CARE_PROVIDER_SITE_OTHER): Payer: Medicare Other | Admitting: Cardiology

## 2010-07-24 DIAGNOSIS — E039 Hypothyroidism, unspecified: Secondary | ICD-10-CM

## 2010-07-24 DIAGNOSIS — E785 Hyperlipidemia, unspecified: Secondary | ICD-10-CM

## 2010-07-24 DIAGNOSIS — Z720 Tobacco use: Secondary | ICD-10-CM

## 2010-07-24 DIAGNOSIS — R42 Dizziness and giddiness: Secondary | ICD-10-CM

## 2010-07-24 DIAGNOSIS — I679 Cerebrovascular disease, unspecified: Secondary | ICD-10-CM

## 2010-07-24 DIAGNOSIS — R55 Syncope and collapse: Secondary | ICD-10-CM

## 2010-07-24 DIAGNOSIS — F17200 Nicotine dependence, unspecified, uncomplicated: Secondary | ICD-10-CM

## 2010-07-24 MED ORDER — PRAVASTATIN SODIUM 80 MG PO TABS: 80.0000 mg | ORAL_TABLET | Freq: Every evening | ORAL | Status: DC

## 2010-07-24 NOTE — Assessment & Plan Note (Signed)
Patient has no neurologic symptoms, no significant obstruction but has early atherosclerotic changes.  Continued risk factor management will be stressed.

## 2010-07-24 NOTE — Assessment & Plan Note (Signed)
There has been no recurrence of syncope, and blood pressure measurements are improved with adjustment of her medication.  She will call for any recurrent loss of consciousness.

## 2010-07-24 NOTE — Assessment & Plan Note (Signed)
Patient reports feeling cold when others are warm.  I find no recent thyroid function studies, and a TSH level will be obtained.

## 2010-07-24 NOTE — Assessment & Plan Note (Signed)
Patient has moderately elevated cholesterol with nonobstructive atherosclerotic disease on carotid ultrasound.  She has been treated with atorvastatin the past, but could no longer afford that medication.  We will start pravastatin and a dose of 80 mg q.d. And recheck a lipid profile in one month.

## 2010-07-24 NOTE — Assessment & Plan Note (Addendum)
Patient is proud that she has reduced consumption to 1/2 pack per day.  She initially attempted complete abstinence, but resume smoking after after a day or 2 due to anxiety.  I suspect that she will ultimately require nicotine replacement therapy.  She is unable to tolerate the patches due to skin irritation, but an alternate dosage form could be used.  I will address that issue at her next office visit in 6 months.

## 2010-07-24 NOTE — Progress Notes (Signed)
HPI : [name] returns to the office for continued assessment and treatment of syncope.  Since her last visit, she has had no falls nor loss of consciousness, but has had spells of dizziness.  She immediately assumes a sitting or lying position with resolution of symptoms.  She has tolerated a decrease in her dose of fluoxetine without mood disturbance.  She feels cold all the time and has noticed the onset of a pruritic rash over her arms and legs.  Current Outpatient Prescriptions on File Prior to Visit  Medication Sig Dispense Refill  . albuterol (PROVENTIL) 2 MG tablet Take 2 mg by mouth as needed.        . ALPRAZolam (XANAX) 1 MG tablet Take 1 mg by mouth 4 (four) times daily.        Marland Kitchen FLUoxetine (PROZAC) 20 MG tablet Take 20 mg by mouth daily.        Marland Kitchen levothyroxine (LEVOTHROID) 25 MCG tablet Take 25 mcg by mouth daily.        . Magnesium 250 MG TABS Take 1 tablet by mouth daily.        Marland Kitchen oxycodone (OXYCONTIN) 30 MG TB12 Take 30 mg by mouth every 12 (twelve) hours.        . pravastatin (PRAVACHOL) 80 MG tablet Take 1 tablet (80 mg total) by mouth every evening.  30 tablet  6     Allergies  Allergen Reactions  . Aspirin   . Celecoxib   . Latex   . Tylenol-Codeine       Past medical history, social history, and family history reviewed and updated.  ROS: Denies orthopnea, PND or pedal edema.  Occasional mild chest discomfort.  No dyspnea with a sedentary lifestyle.  PHYSICAL EXAM: BP 110/75  Pulse 84  Ht 5\' 2"  (1.575 m)  Wt 126 lb (57.153 kg)  BMI 23.05 kg/m2  SpO2 95%  General-Well developed; no acute distress Body habitus-proportionate weight and height Neck-No JVD; no carotid bruits Lungs-clear lung fields; resonant to percussion Cardiovascular-normal PMI; normal S1 and S2 Abdomen-normal bowel sounds; soft and non-tender without masses or organomegaly Musculoskeletal-No deformities, no cyanosis or clubbing Neurologic-Normal cranial nerves; symmetric strength and  tone Skin-Warm, erythema over her inner arms without frank skin eruption Extremities-distal pulses intact; no edema  Event Recorder:  No significant arrhythmias.  During 6 symptomatic spells there was no correlation between symptoms and cardiac rhythm.  ASSESSMENT AND PLAN:

## 2010-07-24 NOTE — Patient Instructions (Signed)
**Note De-Identified  Obfuscation** Your physician has recommended you make the following change in your medication: start taking Pravachol 80mg  at bedtime  Your physician discussed the hazards of tobacco use. Tobacco use cessation is recommended and techniques and options to help you quit were discussed.  Your physician recommends that you return for lab work in: today and in 1 month  Your physician recommends that you schedule a follow-up appointment in: 6 months

## 2010-09-03 IMAGING — CT CT HEAD W/O CM
1 series · 16 of 30 positions shown, 20 images · non-contrast
Comparison: None

CLINICAL DATA: Blunt head trauma.  Headache and visual
disturbance.  Blurred vision.

CT HEAD WITHOUT CONTRAST
TECHNIQUE: Contiguous axial images were obtained from the base of
the skull through the vertex without contrast

[Series 2: headtrauma 4.8 h37s · axial · 0.43mm/px · z∈[+136,+268]mm · 16 of 30 slices shown, 20 images]
[im 2/30  brain]
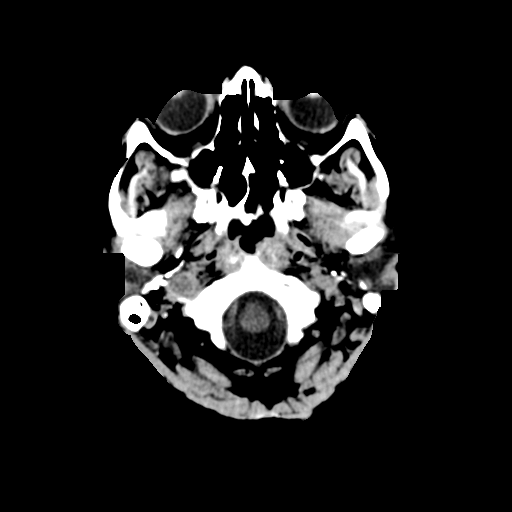
[im 2/30  bone]
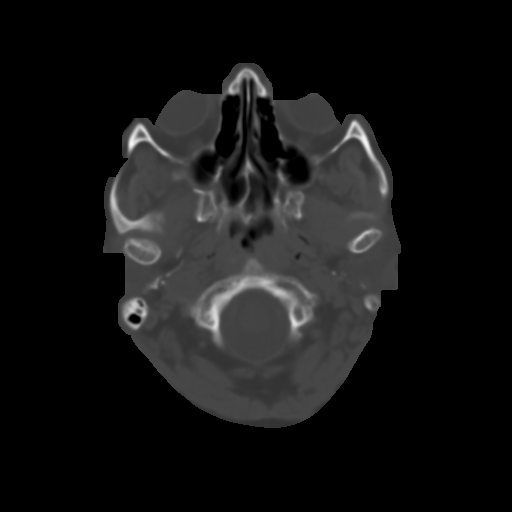
[im 4/30  brain]
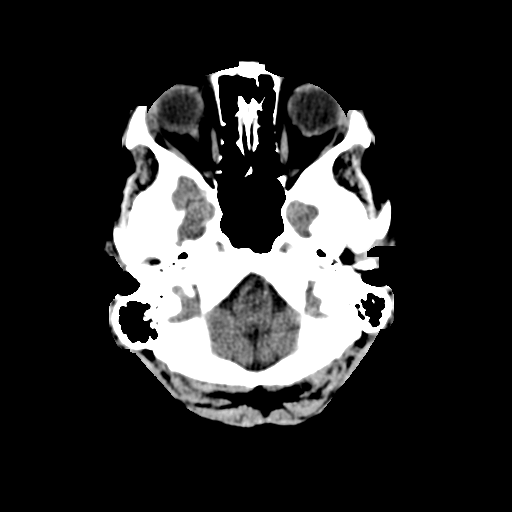
[im 6/30  brain]
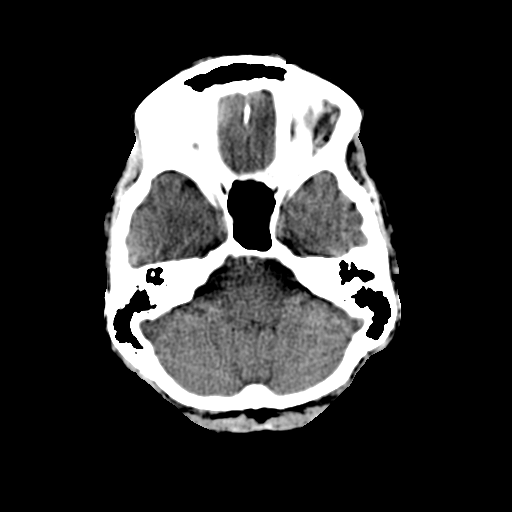
[im 8/30  brain]
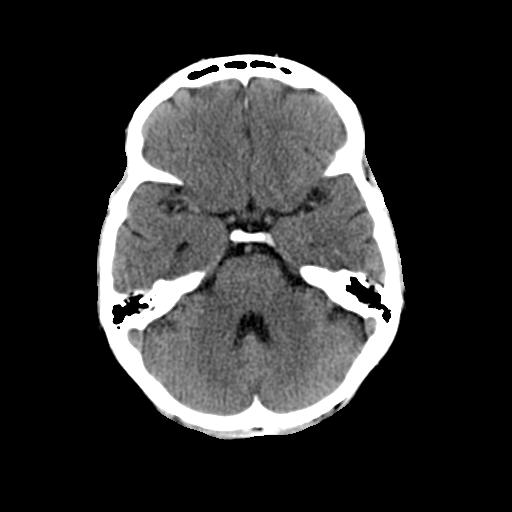
[im 9/30  brain]
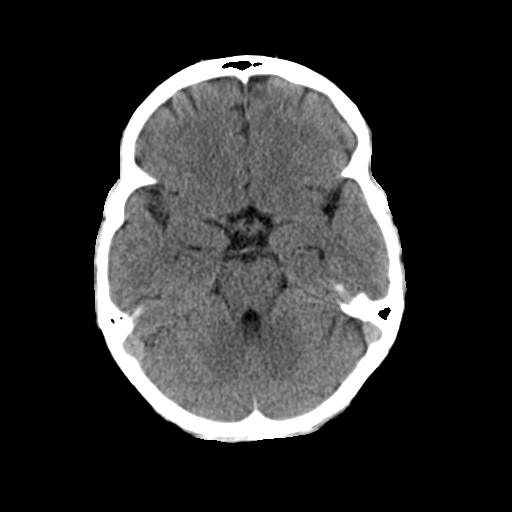
[im 9/30  bone]
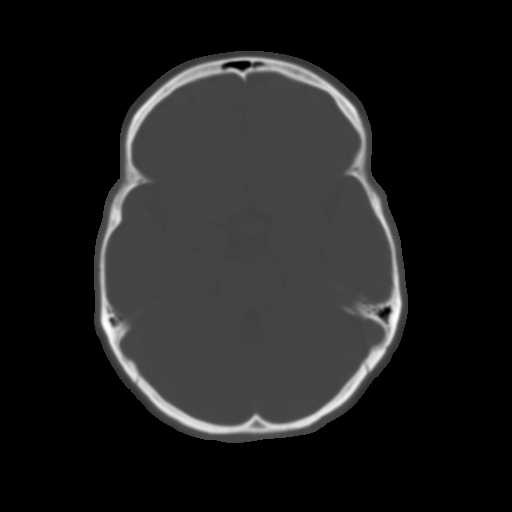
[im 11/30  brain]
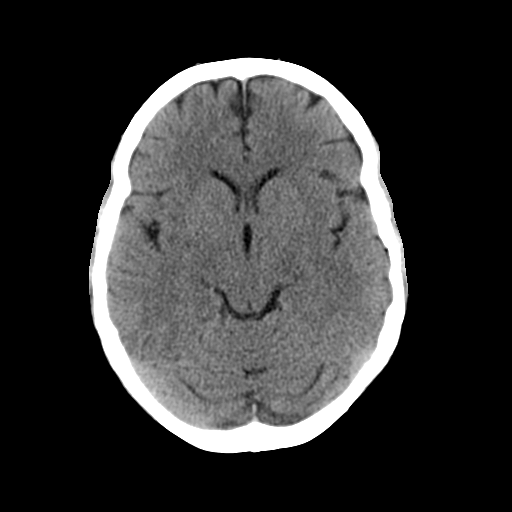
[im 13/30  brain]
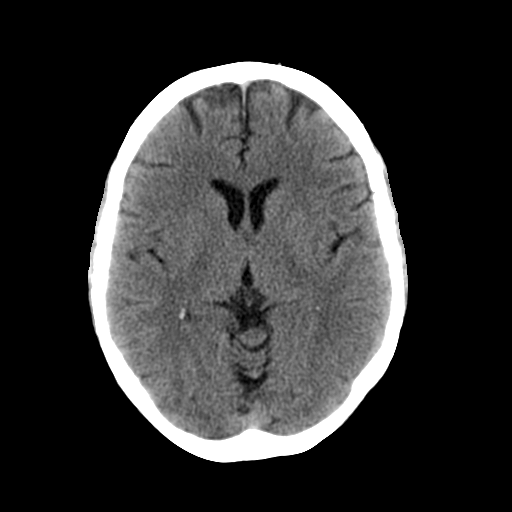
[im 15/30  brain]
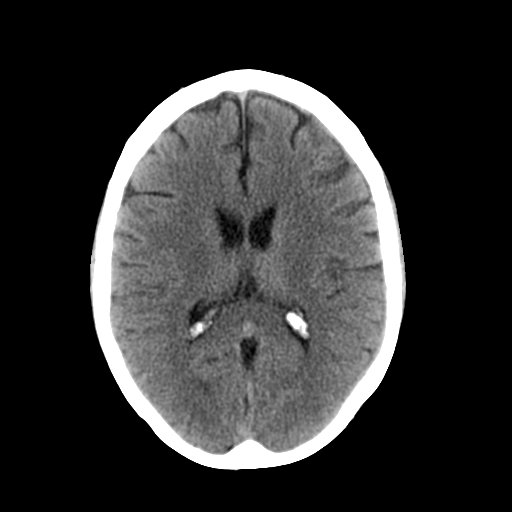
[im 16/30  brain]
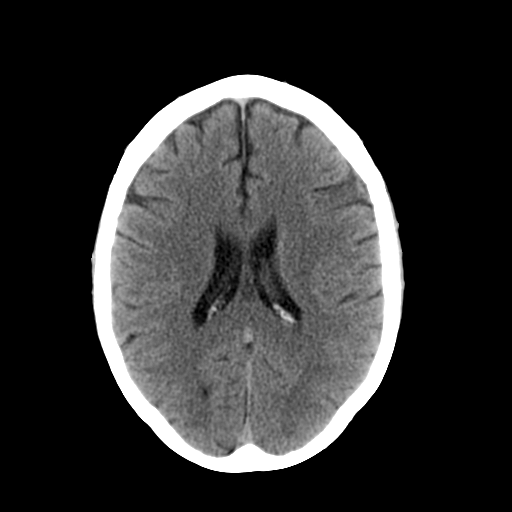
[im 16/30  bone]
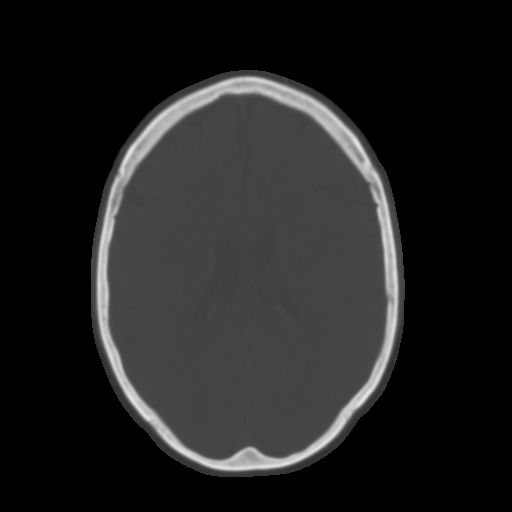
[im 18/30  brain]
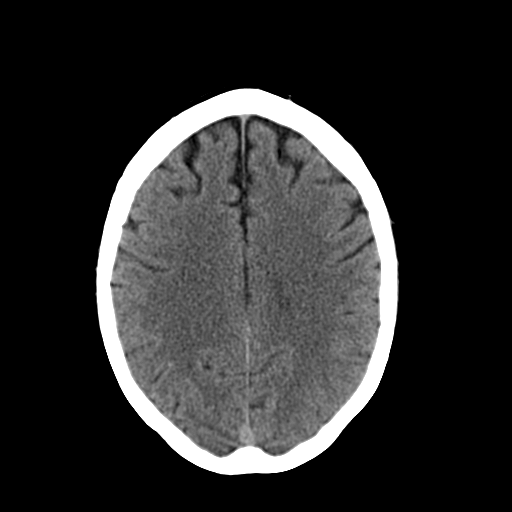
[im 20/30  brain]
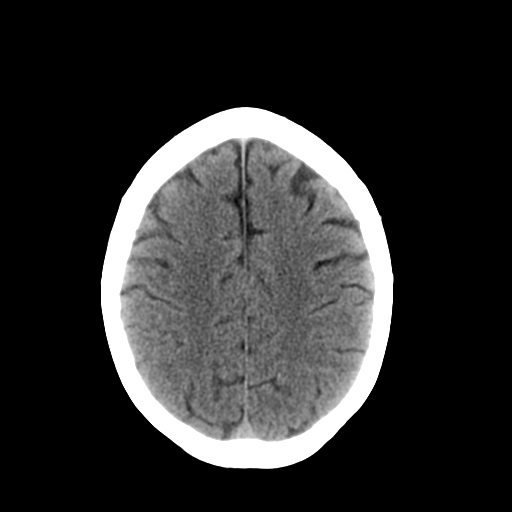
[im 22/30  brain]
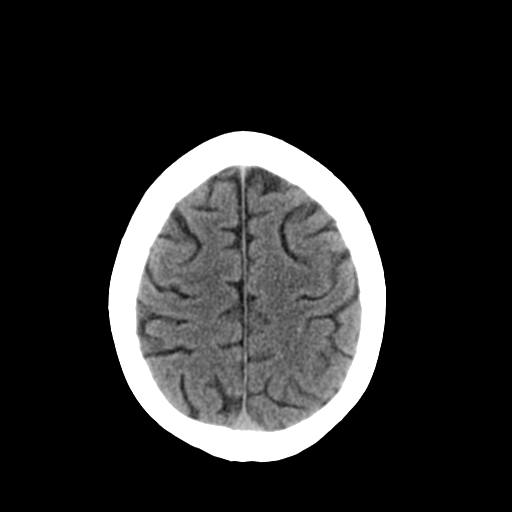
[im 23/30  brain]
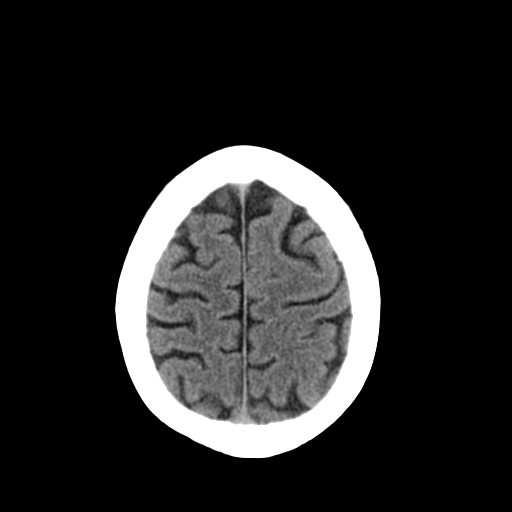
[im 23/30  bone]
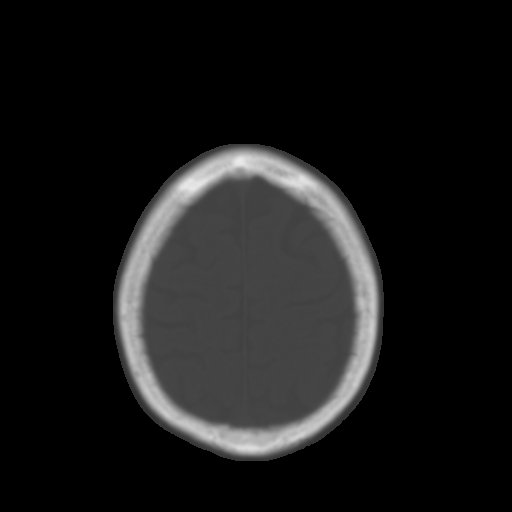
[im 25/30  brain]
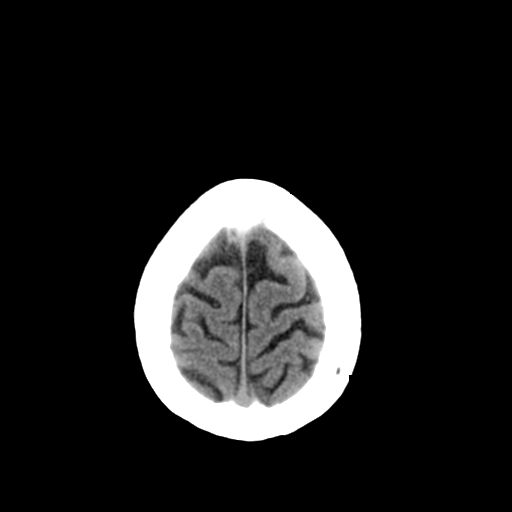
[im 27/30  brain]
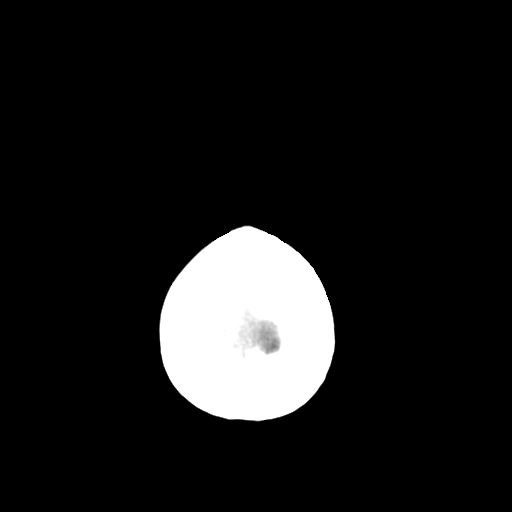
[im 29/30  brain]
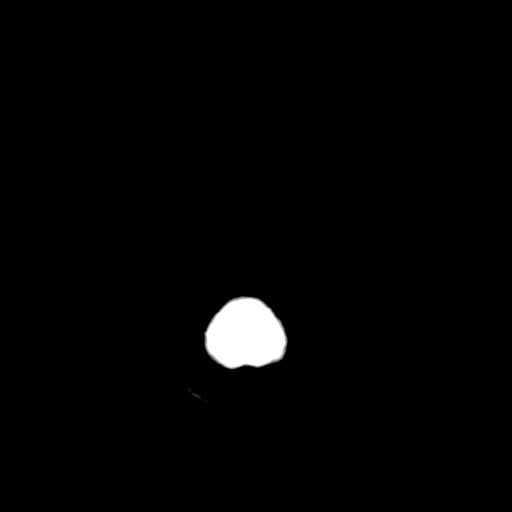

[16 of 30 positions shown; findings below may reference images not displayed]

FINDINGS: There is no evidence of intracranial hemorrhage, brain
edema, or other signs of acute infarction.  There is no evidence of
intracranial mass lesions, or mass effect.  No abnormal extraaxial
fluid collections are identified.  There is no evidence of
hydrocephalus, or other significant intracranial abnormality.  No
skull abnormality identified.
IMPRESSION: Negative non-contrast head CT.

## 2010-09-06 ENCOUNTER — Ambulatory Visit (HOSPITAL_COMMUNITY)
Admission: RE | Admit: 2010-09-06 | Discharge: 2010-09-06 | Disposition: A | Discharge status: Home / Self Care | Payer: Medicare Other | Source: Ambulatory Visit | Attending: Internal Medicine | Admitting: Internal Medicine

## 2010-09-06 ENCOUNTER — Other Ambulatory Visit (HOSPITAL_COMMUNITY): Payer: Self-pay | Admitting: Internal Medicine

## 2010-09-06 DIAGNOSIS — M7120 Synovial cyst of popliteal space [Baker], unspecified knee: Secondary | ICD-10-CM | POA: Insufficient documentation

## 2010-09-06 DIAGNOSIS — M7989 Other specified soft tissue disorders: Secondary | ICD-10-CM | POA: Insufficient documentation

## 2010-09-06 DIAGNOSIS — R609 Edema, unspecified: Secondary | ICD-10-CM

## 2010-09-06 DIAGNOSIS — M79609 Pain in unspecified limb: Secondary | ICD-10-CM | POA: Insufficient documentation

## 2010-10-30 LAB — CBC
HCT: 41.3
Hemoglobin: 13.9
MCHC: 33.6
MCV: 92.8
Platelets: 345
RDW: 13.3
WBC: 11.1 — ABNORMAL HIGH

## 2010-10-30 LAB — DIFFERENTIAL
Basophils Absolute: 0.1
Basophils Relative: 1
Eosinophils Absolute: 0.1
Eosinophils Relative: 1
Lymphocytes Relative: 41
Lymphs Abs: 4.6 — ABNORMAL HIGH
Monocytes Absolute: 0.7
Monocytes Relative: 6
Neutro Abs: 5.6
Neutrophils Relative %: 50

## 2010-10-30 LAB — ETHANOL: Alcohol, Ethyl (B): <5

## 2010-10-30 LAB — BASIC METABOLIC PANEL
BUN: 2 — ABNORMAL LOW
CO2: 36 — ABNORMAL HIGH
Calcium: 9.7
Chloride: 96
Creatinine, Ser: 0.84
GFR calc non Af Amer: >60
Glucose, Bld: 98
Potassium: 3.7
Sodium: 137

## 2010-10-30 LAB — RAPID URINE DRUG SCREEN, HOSP PERFORMED
Amphetamines: NOT DETECTED
Barbiturates: NOT DETECTED
Benzodiazepines: POSITIVE — AB
Cocaine: NOT DETECTED
Opiates: NOT DETECTED
Tetrahydrocannabinol: NOT DETECTED

## 2010-10-30 LAB — TRICYCLICS SCREEN, URINE: TCA Scrn: NOT DETECTED

## 2010-10-31 LAB — CORTISOL
Cortisol, Plasma: 25.6
Cortisol, Plasma: 30.6
Cortisol, Plasma: 8.3

## 2010-11-15 LAB — RAPID URINE DRUG SCREEN, HOSP PERFORMED
Amphetamines: NOT DETECTED
Barbiturates: NOT DETECTED
Benzodiazepines: POSITIVE — AB
Cocaine: NOT DETECTED
Opiates: POSITIVE — AB
Tetrahydrocannabinol: NOT DETECTED

## 2010-11-15 LAB — DIFFERENTIAL
Basophils Absolute: 0
Basophils Relative: 1
Eosinophils Absolute: 0.1
Eosinophils Relative: 2
Lymphocytes Relative: 36
Lymphs Abs: 3.4 — ABNORMAL HIGH
Monocytes Absolute: 0.7
Monocytes Relative: 7
Neutro Abs: 5.1
Neutrophils Relative %: 54

## 2010-11-15 LAB — BASIC METABOLIC PANEL
BUN: 6
CO2: 34 — ABNORMAL HIGH
Calcium: 10
Chloride: 101
Creatinine, Ser: 0.83
GFR calc Af Amer: >60
GFR calc non Af Amer: >60
Glucose, Bld: 91
Potassium: 3.4 — ABNORMAL LOW
Sodium: 138

## 2010-11-15 LAB — URINALYSIS, ROUTINE W REFLEX MICROSCOPIC
Bilirubin Urine: NEGATIVE
Glucose, UA: NEGATIVE
Ketones, ur: NEGATIVE
Leukocytes, UA: NEGATIVE
Nitrite: NEGATIVE
Protein, ur: NEGATIVE
Specific Gravity, Urine: <1.005 — ABNORMAL LOW
Urobilinogen, UA: 0.2
pH: 5.5

## 2010-11-15 LAB — ETHANOL: Alcohol, Ethyl (B): <5

## 2010-11-15 LAB — CBC
HCT: 36.8
Hemoglobin: 12.6
MCHC: 34.1
MCV: 92.3
Platelets: 394
RBC: 3.98
RDW: 12.5
WBC: 9.3

## 2010-11-15 LAB — URINE MICROSCOPIC-ADD ON

## 2010-11-22 ENCOUNTER — Encounter: Payer: Self-pay | Admitting: Cardiology

## 2010-11-22 DIAGNOSIS — Z7189 Other specified counseling: Secondary | ICD-10-CM | POA: Insufficient documentation

## 2010-11-22 DIAGNOSIS — G8929 Other chronic pain: Secondary | ICD-10-CM | POA: Insufficient documentation

## 2010-11-22 DIAGNOSIS — Z0189 Encounter for other specified special examinations: Secondary | ICD-10-CM | POA: Insufficient documentation

## 2010-11-22 DIAGNOSIS — G43909 Migraine, unspecified, not intractable, without status migrainosus: Secondary | ICD-10-CM

## 2010-11-22 DIAGNOSIS — K219 Gastro-esophageal reflux disease without esophagitis: Secondary | ICD-10-CM

## 2010-11-22 HISTORY — DX: Other chronic pain: G89.29

## 2010-11-22 HISTORY — DX: Migraine, unspecified, not intractable, without status migrainosus: G43.909

## 2010-11-22 HISTORY — DX: Gastro-esophageal reflux disease without esophagitis: K21.9

## 2011-05-08 ENCOUNTER — Other Ambulatory Visit (HOSPITAL_COMMUNITY): Payer: Self-pay | Admitting: Internal Medicine

## 2011-05-08 DIAGNOSIS — Z139 Encounter for screening, unspecified: Secondary | ICD-10-CM

## 2011-05-13 ENCOUNTER — Ambulatory Visit (HOSPITAL_COMMUNITY): Payer: Medicare Other

## 2011-05-20 ENCOUNTER — Ambulatory Visit (HOSPITAL_COMMUNITY)
Admission: RE | Admit: 2011-05-20 | Discharge: 2011-05-20 | Disposition: A | Discharge status: Home / Self Care | Payer: Medicare Other | Source: Ambulatory Visit | Attending: Internal Medicine | Admitting: Internal Medicine

## 2011-05-20 DIAGNOSIS — Z1231 Encounter for screening mammogram for malignant neoplasm of breast: Secondary | ICD-10-CM | POA: Insufficient documentation

## 2011-05-20 DIAGNOSIS — Z139 Encounter for screening, unspecified: Secondary | ICD-10-CM

## 2011-05-22 ENCOUNTER — Telehealth: Payer: Self-pay

## 2011-05-22 NOTE — Telephone Encounter (Signed)
LMOM for pt to call. ( See med list) might need OV first.

## 2011-06-03 NOTE — Telephone Encounter (Signed)
VM not set up on [phone].  LMOM at [phone] to call.

## 2011-06-04 NOTE — Telephone Encounter (Signed)
LMOM on [phone] for a return call.

## 2011-08-19 ENCOUNTER — Other Ambulatory Visit (HOSPITAL_COMMUNITY): Payer: Self-pay | Admitting: Internal Medicine

## 2011-08-19 ENCOUNTER — Ambulatory Visit (HOSPITAL_COMMUNITY)
Admission: RE | Admit: 2011-08-19 | Discharge: 2011-08-19 | Disposition: A | Discharge status: Home / Self Care | Payer: Medicare Other | Source: Ambulatory Visit | Attending: Internal Medicine | Admitting: Internal Medicine

## 2011-08-19 DIAGNOSIS — M545 Low back pain, unspecified: Secondary | ICD-10-CM | POA: Insufficient documentation

## 2011-08-19 DIAGNOSIS — M549 Dorsalgia, unspecified: Secondary | ICD-10-CM

## 2011-09-05 ENCOUNTER — Ambulatory Visit: Payer: Medicare Other | Admitting: Adult Health

## 2012-01-08 ENCOUNTER — Telehealth: Payer: Self-pay

## 2012-01-08 NOTE — Telephone Encounter (Signed)
Pt has hemorrhoids and was scheduled OV appt on 01/13/2012 at 1:30 PM with Lorenza Burton, NP.

## 2012-01-13 ENCOUNTER — Telehealth: Payer: Self-pay | Admitting: Urgent Care

## 2012-01-13 ENCOUNTER — Ambulatory Visit: Payer: Medicare Other | Admitting: Urgent Care

## 2012-01-13 NOTE — Telephone Encounter (Signed)
Please offer reschedule Thanks 

## 2012-01-13 NOTE — Telephone Encounter (Signed)
Pt was a no show

## 2012-02-06 ENCOUNTER — Other Ambulatory Visit (HOSPITAL_COMMUNITY): Payer: Self-pay | Admitting: Family Medicine

## 2012-02-06 ENCOUNTER — Ambulatory Visit (HOSPITAL_COMMUNITY)
Admission: RE | Admit: 2012-02-06 | Discharge: 2012-02-06 | Disposition: A | Discharge status: Home / Self Care | Payer: Medicare Other | Source: Ambulatory Visit | Attending: Family Medicine | Admitting: Family Medicine

## 2012-02-06 DIAGNOSIS — R05 Cough: Secondary | ICD-10-CM | POA: Insufficient documentation

## 2012-02-06 DIAGNOSIS — R059 Cough, unspecified: Secondary | ICD-10-CM | POA: Insufficient documentation

## 2012-02-06 DIAGNOSIS — J159 Unspecified bacterial pneumonia: Secondary | ICD-10-CM

## 2012-02-06 DIAGNOSIS — R0989 Other specified symptoms and signs involving the circulatory and respiratory systems: Secondary | ICD-10-CM | POA: Insufficient documentation

## 2012-02-20 ENCOUNTER — Inpatient Hospital Stay (HOSPITAL_COMMUNITY)
Admission: EM | Admit: 2012-02-20 | Discharge: 2012-02-22 | DRG: 194 | Disposition: A | Discharge status: Home / Self Care | Payer: Medicare Other | Attending: Emergency Medicine | Admitting: Emergency Medicine

## 2012-02-20 ENCOUNTER — Emergency Department (HOSPITAL_COMMUNITY): Payer: Medicare Other

## 2012-02-20 ENCOUNTER — Encounter (HOSPITAL_COMMUNITY): Payer: Self-pay | Admitting: *Deleted

## 2012-02-20 DIAGNOSIS — F17200 Nicotine dependence, unspecified, uncomplicated: Secondary | ICD-10-CM | POA: Diagnosis present

## 2012-02-20 DIAGNOSIS — Z886 Allergy status to analgesic agent status: Secondary | ICD-10-CM

## 2012-02-20 DIAGNOSIS — F17210 Nicotine dependence, cigarettes, uncomplicated: Secondary | ICD-10-CM | POA: Diagnosis present

## 2012-02-20 DIAGNOSIS — I679 Cerebrovascular disease, unspecified: Secondary | ICD-10-CM

## 2012-02-20 DIAGNOSIS — Z9089 Acquired absence of other organs: Secondary | ICD-10-CM

## 2012-02-20 DIAGNOSIS — E860 Dehydration: Secondary | ICD-10-CM | POA: Diagnosis present

## 2012-02-20 DIAGNOSIS — Z79899 Other long term (current) drug therapy: Secondary | ICD-10-CM

## 2012-02-20 DIAGNOSIS — Z882 Allergy status to sulfonamides status: Secondary | ICD-10-CM

## 2012-02-20 DIAGNOSIS — A419 Sepsis, unspecified organism: Secondary | ICD-10-CM

## 2012-02-20 DIAGNOSIS — I10 Essential (primary) hypertension: Secondary | ICD-10-CM | POA: Diagnosis present

## 2012-02-20 DIAGNOSIS — N19 Unspecified kidney failure: Secondary | ICD-10-CM

## 2012-02-20 DIAGNOSIS — I959 Hypotension, unspecified: Secondary | ICD-10-CM | POA: Diagnosis present

## 2012-02-20 DIAGNOSIS — Z9181 History of falling: Secondary | ICD-10-CM

## 2012-02-20 DIAGNOSIS — IMO0002 Reserved for concepts with insufficient information to code with codable children: Secondary | ICD-10-CM

## 2012-02-20 DIAGNOSIS — F32A Depression, unspecified: Secondary | ICD-10-CM

## 2012-02-20 DIAGNOSIS — R079 Chest pain, unspecified: Secondary | ICD-10-CM | POA: Diagnosis present

## 2012-02-20 DIAGNOSIS — G8929 Other chronic pain: Secondary | ICD-10-CM

## 2012-02-20 DIAGNOSIS — N179 Acute kidney failure, unspecified: Secondary | ICD-10-CM | POA: Diagnosis present

## 2012-02-20 DIAGNOSIS — Z8543 Personal history of malignant neoplasm of ovary: Secondary | ICD-10-CM

## 2012-02-20 DIAGNOSIS — E785 Hyperlipidemia, unspecified: Secondary | ICD-10-CM

## 2012-02-20 DIAGNOSIS — Z8701 Personal history of pneumonia (recurrent): Secondary | ICD-10-CM

## 2012-02-20 DIAGNOSIS — E039 Hypothyroidism, unspecified: Secondary | ICD-10-CM

## 2012-02-20 DIAGNOSIS — F3289 Other specified depressive episodes: Secondary | ICD-10-CM | POA: Diagnosis present

## 2012-02-20 DIAGNOSIS — Z0189 Encounter for other specified special examinations: Secondary | ICD-10-CM

## 2012-02-20 DIAGNOSIS — Z90710 Acquired absence of both cervix and uterus: Secondary | ICD-10-CM

## 2012-02-20 DIAGNOSIS — Z23 Encounter for immunization: Secondary | ICD-10-CM

## 2012-02-20 DIAGNOSIS — F411 Generalized anxiety disorder: Secondary | ICD-10-CM | POA: Diagnosis present

## 2012-02-20 DIAGNOSIS — E2740 Unspecified adrenocortical insufficiency: Secondary | ICD-10-CM

## 2012-02-20 DIAGNOSIS — F419 Anxiety disorder, unspecified: Secondary | ICD-10-CM

## 2012-02-20 DIAGNOSIS — R579 Shock, unspecified: Secondary | ICD-10-CM

## 2012-02-20 DIAGNOSIS — Z885 Allergy status to narcotic agent status: Secondary | ICD-10-CM

## 2012-02-20 DIAGNOSIS — G458 Other transient cerebral ischemic attacks and related syndromes: Secondary | ICD-10-CM

## 2012-02-20 DIAGNOSIS — Z888 Allergy status to other drugs, medicaments and biological substances status: Secondary | ICD-10-CM

## 2012-02-20 DIAGNOSIS — F329 Major depressive disorder, single episode, unspecified: Secondary | ICD-10-CM | POA: Diagnosis present

## 2012-02-20 DIAGNOSIS — E2749 Other adrenocortical insufficiency: Secondary | ICD-10-CM | POA: Diagnosis present

## 2012-02-20 DIAGNOSIS — Z91040 Latex allergy status: Secondary | ICD-10-CM

## 2012-02-20 DIAGNOSIS — G43909 Migraine, unspecified, not intractable, without status migrainosus: Secondary | ICD-10-CM

## 2012-02-20 DIAGNOSIS — K219 Gastro-esophageal reflux disease without esophagitis: Secondary | ICD-10-CM

## 2012-02-20 DIAGNOSIS — J189 Pneumonia, unspecified organism: Principal | ICD-10-CM

## 2012-02-20 DIAGNOSIS — Z720 Tobacco use: Secondary | ICD-10-CM

## 2012-02-20 HISTORY — DX: Pneumonia, unspecified organism: J18.9

## 2012-02-20 HISTORY — DX: Bronchitis, not specified as acute or chronic: J40

## 2012-02-20 LAB — BASIC METABOLIC PANEL
BUN: 15 mg/dL (ref 6–23)
CO2: 29 mEq/L (ref 19–32)
Chloride: 89 mEq/L — ABNORMAL LOW (ref 96–112)
Creatinine, Ser: 1.45 mg/dL — ABNORMAL HIGH (ref 0.50–1.10)
GFR calc Af Amer: 46 mL/min — ABNORMAL LOW (ref >90)
Glucose, Bld: 102 mg/dL — ABNORMAL HIGH (ref 70–99)
Potassium: 3.8 mEq/L (ref 3.5–5.1)

## 2012-02-20 LAB — CBC WITH DIFFERENTIAL/PLATELET
HCT: 36.7 % (ref 36.0–46.0)
Hemoglobin: 12.4 g/dL (ref 12.0–15.0)
Lymphocytes Relative: 12 % (ref 12–46)
Lymphs Abs: 3 10*3/uL (ref 0.7–4.0)
Monocytes Absolute: 1.9 10*3/uL — ABNORMAL HIGH (ref 0.1–1.0)
Monocytes Relative: 8 % (ref 3–12)
Neutro Abs: 19.8 10*3/uL — ABNORMAL HIGH (ref 1.7–7.7)
Neutrophils Relative %: 80 % — ABNORMAL HIGH (ref 43–77)
RBC: 3.91 MIL/uL (ref 3.87–5.11)

## 2012-02-20 MED ORDER — IBUPROFEN 800 MG PO TABS
800.0000 mg | ORAL_TABLET | Freq: Once | ORAL | Status: AC
  Administered 2012-02-20: 800 mg via ORAL
  Filled 2012-02-20: qty 1

## 2012-02-20 MED ORDER — SODIUM CHLORIDE 0.9 % IV SOLN: INTRAVENOUS | Status: DC

## 2012-02-20 MED ORDER — SODIUM CHLORIDE 0.9 % IV SOLN
Freq: Once | INTRAVENOUS | Status: AC
  Administered 2012-02-20: 23:00:00 via INTRAVENOUS

## 2012-02-20 NOTE — ED Notes (Signed)
Pt with fever "103", pt with recent pna, denies taking any tylenol or motrin

## 2012-02-20 NOTE — ED Notes (Signed)
Pt not in waiting room x 1 

## 2012-02-21 ENCOUNTER — Encounter (HOSPITAL_COMMUNITY): Payer: Self-pay | Admitting: *Deleted

## 2012-02-21 ENCOUNTER — Inpatient Hospital Stay (HOSPITAL_COMMUNITY): Payer: Medicare Other

## 2012-02-21 DIAGNOSIS — E2740 Unspecified adrenocortical insufficiency: Secondary | ICD-10-CM | POA: Diagnosis present

## 2012-02-21 DIAGNOSIS — N19 Unspecified kidney failure: Secondary | ICD-10-CM

## 2012-02-21 DIAGNOSIS — A419 Sepsis, unspecified organism: Secondary | ICD-10-CM | POA: Diagnosis present

## 2012-02-21 DIAGNOSIS — J189 Pneumonia, unspecified organism: Principal | ICD-10-CM

## 2012-02-21 DIAGNOSIS — F17200 Nicotine dependence, unspecified, uncomplicated: Secondary | ICD-10-CM

## 2012-02-21 DIAGNOSIS — G458 Other transient cerebral ischemic attacks and related syndromes: Secondary | ICD-10-CM | POA: Diagnosis present

## 2012-02-21 LAB — URINALYSIS, MICROSCOPIC ONLY
Glucose, UA: NEGATIVE mg/dL
Ketones, ur: NEGATIVE mg/dL
Leukocytes, UA: NEGATIVE
Nitrite: NEGATIVE
Protein, ur: NEGATIVE mg/dL
Urobilinogen, UA: 0.2 mg/dL (ref 0.0–1.0)

## 2012-02-21 LAB — PHOSPHORUS: Phosphorus: 3.4 mg/dL (ref 2.3–4.6)

## 2012-02-21 LAB — CBC
HCT: 31.3 % — ABNORMAL LOW (ref 36.0–46.0)
Hemoglobin: 10.4 g/dL — ABNORMAL LOW (ref 12.0–15.0)
MCH: 31.5 pg (ref 26.0–34.0)
MCHC: 33.2 g/dL (ref 30.0–36.0)
RDW: 13.1 % (ref 11.5–15.5)

## 2012-02-21 LAB — CREATININE, SERUM
Creatinine, Ser: 1.23 mg/dL — ABNORMAL HIGH (ref 0.50–1.10)
GFR calc non Af Amer: 48 mL/min — ABNORMAL LOW (ref >90)

## 2012-02-21 LAB — MAGNESIUM: Magnesium: 1.5 mg/dL (ref 1.5–2.5)

## 2012-02-21 MED ORDER — SODIUM CHLORIDE 0.9 % IV SOLN
INTRAVENOUS | Status: AC
  Administered 2012-02-22: 05:00:00 via INTRAVENOUS

## 2012-02-21 MED ORDER — ALBUTEROL SULFATE (5 MG/ML) 0.5% IN NEBU
2.5000 mg | INHALATION_SOLUTION | RESPIRATORY_TRACT | Status: DC | PRN
  Administered 2012-02-21: 2.5 mg via RESPIRATORY_TRACT
  Filled 2012-02-21: qty 0.5

## 2012-02-21 MED ORDER — DULOXETINE HCL 60 MG PO CPEP
90.0000 mg | ORAL_CAPSULE | Freq: Every day | ORAL | Status: DC
  Administered 2012-02-21 – 2012-02-22 (×2): 90 mg via ORAL
  Filled 2012-02-21 (×2): qty 1

## 2012-02-21 MED ORDER — ALUM & MAG HYDROXIDE-SIMETH 200-200-20 MG/5ML PO SUSP: 30.0000 mL | ORAL | Status: DC | PRN

## 2012-02-21 MED ORDER — ONDANSETRON HCL 4 MG/2ML IJ SOLN: 4.0000 mg | Freq: Four times a day (QID) | INTRAMUSCULAR | Status: DC | PRN

## 2012-02-21 MED ORDER — PNEUMOCOCCAL VAC POLYVALENT 25 MCG/0.5ML IJ INJ
0.5000 mL | INJECTION | INTRAMUSCULAR | Status: AC
  Administered 2012-02-22: 0.5 mL via INTRAMUSCULAR
  Filled 2012-02-21: qty 0.5

## 2012-02-21 MED ORDER — DEXTROSE 5 % IV SOLN
INTRAVENOUS | Status: AC
  Filled 2012-02-21: qty 10

## 2012-02-21 MED ORDER — DEXTROSE 5 % IV SOLN
INTRAVENOUS | Status: AC
  Filled 2012-02-21: qty 500

## 2012-02-21 MED ORDER — PIPERACILLIN-TAZOBACTAM 3.375 G IVPB
3.3750 g | Freq: Once | INTRAVENOUS | Status: DC
  Filled 2012-02-21: qty 50

## 2012-02-21 MED ORDER — LEVOTHYROXINE SODIUM 25 MCG PO TABS
25.0000 ug | ORAL_TABLET | Freq: Every day | ORAL | Status: DC
  Administered 2012-02-21 – 2012-02-22 (×2): 25 ug via ORAL
  Filled 2012-02-21 (×2): qty 1

## 2012-02-21 MED ORDER — ACETAMINOPHEN 325 MG PO TABS: 650.0000 mg | ORAL_TABLET | Freq: Four times a day (QID) | ORAL | Status: DC | PRN

## 2012-02-21 MED ORDER — SODIUM CHLORIDE 0.9 % IV BOLUS (SEPSIS)
1000.0000 mL | Freq: Once | INTRAVENOUS | Status: AC
  Administered 2012-02-21: 1000 mL via INTRAVENOUS

## 2012-02-21 MED ORDER — DEXTROSE 5 % IV SOLN
1.0000 g | INTRAVENOUS | Status: DC
  Administered 2012-02-21 – 2012-02-22 (×2): 1 g via INTRAVENOUS
  Filled 2012-02-21 (×4): qty 10

## 2012-02-21 MED ORDER — OXYCODONE HCL 5 MG PO TABS
10.0000 mg | ORAL_TABLET | Freq: Four times a day (QID) | ORAL | Status: DC | PRN
  Administered 2012-02-21: 10 mg via ORAL
  Filled 2012-02-21: qty 2

## 2012-02-21 MED ORDER — ASPIRIN 325 MG PO TABS: 325.0000 mg | ORAL_TABLET | Freq: Every day | ORAL | Status: DC

## 2012-02-21 MED ORDER — LEVOFLOXACIN IN D5W 750 MG/150ML IV SOLN
750.0000 mg | Freq: Once | INTRAVENOUS | Status: AC
  Administered 2012-02-21: 750 mg via INTRAVENOUS
  Filled 2012-02-21: qty 150

## 2012-02-21 MED ORDER — OXYCODONE HCL 5 MG PO TABS
20.0000 mg | ORAL_TABLET | Freq: Four times a day (QID) | ORAL | Status: DC | PRN
  Administered 2012-02-21 – 2012-02-22 (×4): 20 mg via ORAL
  Filled 2012-02-21 (×3): qty 4
  Filled 2012-02-21: qty 6
  Filled 2012-02-21: qty 4

## 2012-02-21 MED ORDER — CLOPIDOGREL BISULFATE 75 MG PO TABS
75.0000 mg | ORAL_TABLET | Freq: Every day | ORAL | Status: DC
  Administered 2012-02-21 – 2012-02-22 (×2): 75 mg via ORAL
  Filled 2012-02-21 (×2): qty 1

## 2012-02-21 MED ORDER — PANTOPRAZOLE SODIUM 40 MG PO TBEC
40.0000 mg | DELAYED_RELEASE_TABLET | Freq: Every day | ORAL | Status: DC
  Administered 2012-02-21 – 2012-02-22 (×2): 40 mg via ORAL
  Filled 2012-02-21 (×2): qty 1

## 2012-02-21 MED ORDER — SIMVASTATIN 20 MG PO TABS
20.0000 mg | ORAL_TABLET | Freq: Every day | ORAL | Status: DC
  Administered 2012-02-21: 20 mg via ORAL
  Filled 2012-02-21: qty 1

## 2012-02-21 MED ORDER — DIPHENHYDRAMINE HCL 25 MG PO CAPS: 25.0000 mg | ORAL_CAPSULE | ORAL | Status: DC | PRN

## 2012-02-21 MED ORDER — DULOXETINE HCL 60 MG PO CPEP
60.0000 mg | ORAL_CAPSULE | Freq: Every morning | ORAL | Status: DC
Start: 2012-02-21 — End: 2012-02-21

## 2012-02-21 MED ORDER — OXYCODONE HCL ER 15 MG PO T12A
30.0000 mg | EXTENDED_RELEASE_TABLET | Freq: Two times a day (BID) | ORAL | Status: DC
  Administered 2012-02-21: 30 mg via ORAL
  Filled 2012-02-21: qty 2

## 2012-02-21 MED ORDER — VANCOMYCIN HCL IN DEXTROSE 1-5 GM/200ML-% IV SOLN
1000.0000 mg | Freq: Once | INTRAVENOUS | Status: AC
  Administered 2012-02-21: 1000 mg via INTRAVENOUS
  Filled 2012-02-21: qty 200

## 2012-02-21 MED ORDER — DIPHENHYDRAMINE HCL 50 MG/ML IJ SOLN
INTRAMUSCULAR | Status: AC
  Administered 2012-02-21: 25 mg
  Filled 2012-02-21: qty 1

## 2012-02-21 MED ORDER — VANCOMYCIN HCL 500 MG IV SOLR
500.0000 mg | Freq: Two times a day (BID) | INTRAVENOUS | Status: DC
  Administered 2012-02-21 – 2012-02-22 (×2): 500 mg via INTRAVENOUS
  Filled 2012-02-21 (×7): qty 500

## 2012-02-21 MED ORDER — OXYCODONE HCL ER 15 MG PO T12A
30.0000 mg | EXTENDED_RELEASE_TABLET | Freq: Three times a day (TID) | ORAL | Status: DC
  Administered 2012-02-21 – 2012-02-22 (×2): 30 mg via ORAL
  Filled 2012-02-21 (×2): qty 2

## 2012-02-21 MED ORDER — ALPRAZOLAM 1 MG PO TABS
1.0000 mg | ORAL_TABLET | Freq: Three times a day (TID) | ORAL | Status: DC | PRN
  Administered 2012-02-21 – 2012-02-22 (×4): 1 mg via ORAL
  Filled 2012-02-21 (×2): qty 1
  Filled 2012-02-21: qty 2
  Filled 2012-02-21: qty 1

## 2012-02-21 MED ORDER — DULOXETINE HCL 30 MG PO CPEP
30.0000 mg | ORAL_CAPSULE | Freq: Every morning | ORAL | Status: DC
Start: 2012-02-21 — End: 2012-02-21

## 2012-02-21 MED ORDER — DEXTROSE 5 % IV SOLN
500.0000 mg | INTRAVENOUS | Status: DC
  Administered 2012-02-21 – 2012-02-22 (×2): 500 mg via INTRAVENOUS
  Filled 2012-02-21 (×4): qty 500

## 2012-02-21 MED ORDER — ENOXAPARIN SODIUM 40 MG/0.4ML ~~LOC~~ SOLN
40.0000 mg | SUBCUTANEOUS | Status: DC
  Administered 2012-02-21 – 2012-02-22 (×2): 40 mg via SUBCUTANEOUS
  Filled 2012-02-21 (×2): qty 0.4

## 2012-02-21 MED ORDER — HYDROCORTISONE SOD SUCCINATE 100 MG IJ SOLR
50.0000 mg | Freq: Three times a day (TID) | INTRAMUSCULAR | Status: DC
  Administered 2012-02-21: 50 mg via INTRAVENOUS
  Filled 2012-02-21: qty 2

## 2012-02-21 MED ORDER — IPRATROPIUM BROMIDE 0.02 % IN SOLN
0.5000 mg | RESPIRATORY_TRACT | Status: DC | PRN
  Administered 2012-02-21: 0.5 mg via RESPIRATORY_TRACT
  Filled 2012-02-21: qty 2.5

## 2012-02-21 NOTE — ED Notes (Signed)
Blood pressure still low. Of 74/57. Second bolus of fluids given

## 2012-02-21 NOTE — ED Notes (Signed)
Pt eating

## 2012-02-21 NOTE — ED Provider Notes (Signed)
57 year old female who presents approximately 2 weeks after being diagnosed with and treated for a pneumonia. She states that she has had fevers over 103F, increased shortness of breath and cough she feels like this is mildly improving. She has had multiple different treatment regimens prior to arrival including 2 rounds of steroids and antibiotics but has continued to decline. Her symptoms are severe, nothing seems to make it better or worseand she denies any rashes, diarrhea, swelling or headaches. She does admit to feeling severely weak and to having dysuria.  Review of systems is positive for shortness of breath, cough, fever and myalgias, negative for diarrhea, rashes, swelling, headaches. A full 10 review of systems was reviewed and negative other than that stated above.  Physical exam  General: The patient appears in mild distress with increased work of breathing, tachypnea but speaking in full sentence.  The patient's neck is supple without any lymphadenopathy, erythema or stiffness. No meningismus and no torticollis, no thyromegaly.  Heart sounds are regular, pulse is in the normal range, no murmurs rubs or gallops, strong pulses at the radial arteries bilaterally, normal capillary refill, no JVD but the patient is significantly hypotensive with blood pressures between 70 systolic and 90 systolic.Soft and nontender abdomen without any focal tenderness to palpation. No peritoneal signs, normal bowel sounds, no guarding, no masses palpated. No hernias visualized or palpatedThe patient is in mild acute distress, has clear lung sounds, no wheezing rhonchi or rales, increased work of breathing,  No peripheral edema, normal range of motion of the major joints without pain or tenderness. No obvious deformity or trauma, The patient has normal speech, normal memory, normal coordination of all 4 extremities without any limb ataxia. Normal strength  Laboratory workup shows that the patient has a significant  leukocytosis greater than 20,000, persistently hypotensive despite receiving multiple boluses of IV fluids. I suspect that the patient is significantly dehydrated as well as being septic. I have personally interpreted the chest x-ray and find her to be a right middle lobe pneumonia. The patient was treated for healthcare associated pneumonia as she recently had a community-acquired pneumonia. She was given broad-spectrum antibiotics in the emergency department but remained hypotensive. The hospitalist was consult and for admission, the patient will be placed in a step down unit\ICU overnight. She appears hemodynamically compromised and will require ongoing IV fluids and broad-spectrum antibiotics. Because of the patient's fever prior to arrival, hypotension, leukocytosis and the source of the fever being in the lung I suspect that the patient has early sepsis with septic shock.  Critical care has been provided secondary to broad-spectrum antibiotics, multiple IV fluid boluses were persistent hypotension, consultation with the internist for admission to the hospital to a high acuity area. When the patient was released from the emergency department entrance to the floor she had good mental status, was responding to questions appropriately.  CRITICAL CARE Performed by: Vida Roller   Total critical care time: 30  Critical care time was exclusive of separately billable procedures and treating other patients.  Critical care was necessary to treat or prevent imminent or life-threatening deterioration.  Critical care was time spent personally by me on the following activities: development of treatment plan with patient and/or surrogate as well as nursing, discussions with consultants, evaluation of patient's response to treatment, examination of patient, obtaining history from patient or surrogate, ordering and performing treatments and interventions, ordering and review of laboratory studies, ordering and  review of radiographic studies, pulse oximetry and re-evaluation of patient's  condition.   Filed Vitals:   02/21/12 0436 02/21/12 0442 02/21/12 0443 02/21/12 0509  BP: 110/53 78/45 113/52 107/50  Pulse: 73 70 74 68  Temp:    98.2 F (36.8 C)  TempSrc:    Oral  Resp:    16  Height:    5\' 2"  (1.575 m)  Weight:    138 lb 3.2 oz (62.687 kg)  SpO2: 98% 98% 98% 95%   Medical screening examination/treatment/procedure(s) were conducted as a shared visit with non-physician practitioner(s) and myself.  I personally evaluated the patient during the encounter    Vida Roller, MD 02/21/12 [phone]

## 2012-02-21 NOTE — Progress Notes (Signed)
Called by RN re: persistently low blood pressure. RN moved cuff to patient's right arm and obtained a significantly different blood pressure reading- 113/52 which would be much more appropriate given her clinical presentation which did not appear to be that of someone in florid sepsis. She nonetheless has a significant RML PNA. I also reviewed her past records in detail and she had a CT scan in 2010 that showed a possible left Subclavian stenosis. Her story of hanging a shower curtain (arms above her head) is consistent with symptomatic subclavian steal syndrome and she may have actually had a syncopal episode at the time of her fall -it also explains the well demarcated hyperemia on her anterior upper chest and neck when she "gets stressed" or upset.  Assessment- addendum to original H&P  1. Subclavian Steal Syndrome, High Probability  Check Left upper extremity venous and arterial dopplers and carotid dopplers, may need to order MRA or CT angio to confirm diagnosis once renal failure has improved.  Start Plavix-she has an Aspirin allergy?  Start Statin  Need full evaluation for CAD, high risk for advanced atherosclerotic disease  Tobacco cessation counseling  Will need evaluation by vascular surgery once diagnosis confirmed with duplex studies for surgical and endovascular intervention.

## 2012-02-21 NOTE — Progress Notes (Signed)
     Subjective: This lady was admitted with a clinical diagnosis of right middle lobe pneumonia and she has symptoms consistent with this. She also was hypotensive and in acute renal failure, now this is improved somewhat. Blood pressure in both her arms were different. My partner Dr. Phillips Odor thinks she may have subclavian steal  syndrome and she is going to get bilateral carotid Dopplers to further evaluate this.           Physical Exam: Blood pressure 107/50, pulse 68, temperature 98.2 F (36.8 C), temperature source Oral, resp. rate 16, height 5\' 2"  (1.575 m), weight 62.687 kg (138 lb 3.2 oz), SpO2 95.00%. She looks systemically well. She is not toxic or septic. Lung fields show reduced air entry in the right middle and lower zones. There is no bronchial breathing there scattered crackles. She does have bilateral carotid bruits. Heart sounds are present without murmurs. She is alert and oriented.   Investigations:  Recent Results (from the past 240 hour(s))  CULTURE, BLOOD (ROUTINE X 2)     Status: Normal (Preliminary result)   Collection Time   02/20/12 11:40 PM      Component Value Range Status Comment   Specimen Description Blood LEFT ANTECUBITAL   Final    Special Requests BOTTLES DRAWN AEROBIC AND ANAEROBIC 8CC EACH   Final    Culture PENDING   Incomplete    Report Status PENDING   Incomplete   CULTURE, BLOOD (ROUTINE X 2)     Status: Normal (Preliminary result)   Collection Time   02/20/12 11:41 PM      Component Value Range Status Comment   Specimen Description BLOOD LEFT ARM   Final    Special Requests BOTTLES DRAWN AEROBIC AND ANAEROBIC 8CC EACH   Final    Culture PENDING   Incomplete    Report Status PENDING   Incomplete      Basic Metabolic Panel:  Basename 02/21/12 0433 02/20/12 2259  NA -- 126*  K -- 3.8  CL -- 89*  CO2 -- 29  GLUCOSE -- 102*  BUN -- 15  CREATININE 1.23* 1.45*  CALCIUM -- 10.2  MG 1.5 --  PHOS 3.4 --       CBC:  Basename  02/21/12 0433 02/20/12 2259  WBC 16.1* 24.8*  NEUTROABS -- 19.8*  HGB 10.4* 12.4  HCT 31.3* 36.7  MCV 94.8 93.9  PLT 327 407*    Dg Chest 2 View  02/20/2012  *RADIOLOGY REPORT*  Clinical Data: Fever, cough.  CHEST - 2 VIEW  Comparison: 02/06/2012  Findings: New right middle lobe airspace disease.  Coarse lower lobe interstitial markings with mild hyperinflation.  No effusion. Heart size normal.  Vascular clips in the upper abdomen.  Regional bones unremarkable.  Atheromatous aorta.  IMPRESSION:  New right middle lobe airspace consolidation suggesting pneumonia.   Original Report Authenticated By: D. Andria Rhein, MD       Medications: I have reviewed the patient's current medications.  Impression: 1. Right middle lobe pneumonia. 2. Ongoing tobacco abuse. 3. Possibility of subclavian steal syndrome. 4. Hypothyroidism. 5. Dehydration/acute renal failure, improving.     Plan: 1. Continue with intravenous antibiotics. 2. Continue with intravenous fluids. 3. Bilateral carotid Dopplers.     LOS: 1 day   Wilson Singer Pager [phone]  02/21/2012, 8:10 AM

## 2012-02-21 NOTE — H&P (Addendum)
[facility] History and Physical  [name] ZOX:[id number] DOB: Oct 31, 1955 DOA: 02/20/2012  Referring physician: Antonieta Smith PCP: [name]., MD  Specialists: none  Chief Complaint: Fever, Cough, Rib pain  HPI: [name] is a 57 y.o. female with a PMH significant for anxiety/depression, HTN, HLD and ongoing tobacco abuse who presented to ED complaining of two-three week history of progressively worsening respiratory symptoms with development of fever of 103 today and severe weakness. Pertinent history goes back 4 weeks ago when she fell on the edge of her tub while hanging a shower curtain and injured her left side-she has trouble with the history details, but she states she was seen by her PCP and was told she had a rib fracture. She has had severe pain and was fairly immobile after her injury-was "splinting" and has had to hold a pillow to stabilize her left rib region with movement and coughing since her fall. About 1 week after her fall she developed URI symptoms, cough with productive sputum and was treated with "3 different antibiotics" by her PCP-she also states that she has been on steroids for the past month but stopped taking them about 1 week ago because she completed them.   In the ED she has a low grade fever, RML PNA on CXR, significant WBC elevation, hypotension and acute renal failure. Admission requested for sepsis, hypotension and PNA.  Review of Systems:   Past Medical History  Diagnosis Date  . Near syncope   . Anxiety and depression   . Hyperlipidemia   . Hypertension   . Hypothyroid   . Tobacco abuse     1/2 pack per day  . Chronic pain 11/22/2010  . Migraine headache 11/22/2010  . Gastroesophageal reflux disease 11/22/2010  . Pneumonia   . Bronchitis    Past Surgical History  Procedure Date  . Cholecystectomy 2007  . Abdominal hysterectomy     without oophorectomy for neoplastic disease  . Nasal sinus surgery   . Dilation and  curettage of uterus   . Lumbar laminectomy x3    Left; complicated by neurologic dysfunction  . Laparotomy   . Carpal tunnel release     Left   Social History:  reports that she has been smoking Cigarettes.  She has a 20 pack-year smoking history. She has never used smokeless tobacco. She reports that she does not drink alcohol or use illicit drugs.  Allergies  Allergen Reactions  . Acetaminophen-Codeine Nausea And Vomiting  . Aspirin Nausea And Vomiting  . Celecoxib   . Sulfa Antibiotics   . Latex Rash    Family History  Problem Relation Age of Onset  . Colon polyps Mother     Prior to Admission medications   Medication Sig Start Date End Date Taking? Authorizing Provider  albuterol (PROVENTIL) 2 MG tablet Take 2 mg by mouth daily as needed. For shortness of breath   Yes Historical Provider, MD  ALPRAZolam Prudy Feeler) 1 MG tablet Take 1 mg by mouth 4 (four) times daily as needed.    Yes Historical Provider, MD  CYMBALTA 30 MG capsule Take 30 mg by mouth every morning. Takes one capsule with Cymbalta 60mg  for a total of 90mg  daily in the morning 12/27/11  Yes Historical Provider, MD  DULoxetine (CYMBALTA) 60 MG capsule Take 60 mg by mouth every morning. Takes one capsule with Cymbalta 30mg  for a total of 90mg  daily in the morning   Yes Historical Provider, MD  levothyroxine (LEVOTHROID) 25 MCG  tablet Take 25 mcg by mouth every morning.    Yes Historical Provider, MD  Magnesium 250 MG TABS Take 1 tablet by mouth daily.     Yes Historical Provider, MD  NEXIUM 40 MG capsule Take 40 mg by mouth every morning.  12/23/11  Yes Historical Provider, MD  oxycodone (OXYCONTIN) 30 MG TB12 Take 30 mg by mouth every 12 (twelve) hours.     Yes Historical Provider, MD   Physical Exam: Filed Vitals:   02/20/12 2033 02/21/12 0023  BP: 86/64 86/55  Pulse: 109 77  Temp: 99.1 F (37.3 C) 98.3 F (36.8 C)  TempSrc: Oral Oral  Resp: 22   Height: 5\' 2"  (1.575 m)   Weight: 56.7 kg (125 lb)   SpO2:  96% 98%     General:  Not toxic appearing, talkative AOX3-appears much better than her labs, inaging and vitals suggest. She is holding a stuffed animal pillow tightly to her left ribs when she moves.  Eyes: normal  ENT: poor dentition, dry MM  Neck: no adenopathy or JVD  Cardiovascular: Tachycardic, regular no mrg  Respiratory: Scattered rhonchi, crackles >R than L, no echymosis on CW  Abdomen: soft nontender  Skin: Hyperemia on her neck and chest, no petichia or purpura, scabs on her face-Chest wall extremely tender with light palpation of left lateral rib cage, but no skin eruptions or history of shingles.  Musculoskeletal: OA changes  Psychiatric: anxious but appropriate  Neurologic: non focal  Labs on Admission:  Basic Metabolic Panel:  Lab 02/20/12 0981  NA 126*  K 3.8  CL 89*  CO2 29  GLUCOSE 102*  BUN 15  CREATININE 1.45*  CALCIUM 10.2  MG --  PHOS --    Lab 02/20/12 2259  WBC 24.8*  NEUTROABS 19.8*  HGB 12.4  HCT 36.7  MCV 93.9  PLT 407*    Radiological Exams on Admission: Dg Chest 2 View  02/20/2012  *RADIOLOGY REPORT*  Clinical Data: Fever, cough.  CHEST - 2 VIEW  Comparison: 02/06/2012  Findings: New right middle lobe airspace disease.  Coarse lower lobe interstitial markings with mild hyperinflation.  No effusion. Heart size normal.  Vascular clips in the upper abdomen.  Regional bones unremarkable.  Atheromatous aorta.  IMPRESSION:  New right middle lobe airspace consolidation suggesting pneumonia.   Original Report Authenticated By: D. Andria Rhein, MD     [id number] on admission, order placed   Assessment/Plan 1. Sepsis, secondary to RML PNA refractory to outpatient treatment-fever, leukocytosis, hypotension, CXR pos. 2. Acute Renal Failure, pre-renal due to dehydration in acute illness 3. Hypotension secondary to volume depletion, sepsis and possible adrenal insufficiancy 4. Hyperlipidemia 5. Hypothyroid 6. Tobacco abuse 7.  Anxiety  and depression 8. Chronic pain 9. Gastroesophageal reflux disease 10. S/P Fall accidental- no evidence of rib fracture on CXR but need rib detail done-on exam it is allodynia/hyperalgesic pain- almost like zoster pain but there is no skin erruption or history of skin eruption and has been going on since her fall- may be trauma related and given her chronic pain issues the potential for RSD may be present.  Ms. Bow has several chronic medical problems including HTN, HLD, tobacco abuse and chronic pain issues but is primarily being admitted for a Right Middle Lobe PNA that has failed outpatient treatment with oral antibiotics. She is now septic with hypotension, dehydrated and has acute renal failure -No records from outpatient pracice available for review. She gives a history of recent steroid use. She  has also been staying in the hospital with sick relatives recently.  Plan: 1. Admit to inpatient, telemetry v. No step down beds available-stable in ED 2. PNA core measures initiated-await sputum and blood culture data 3. Azithro/Rocephin and Vancomycin given severity of her presentation. 4. Hydrocortisone stress dose steroids for possible AI (checked cortisol-hyponatremia present) related hypotension. 5. Aggressive Volume- suspect pre-renal failure from dehydration/sepsis, repeat BMET in AM 6. If renal function improves may consider CT chest to assess her PNA given heavy tobacco history and also can determine is there are rib fractures or bony/soft tissue trauma to the chest wall on her left side. 7. For her chronic pain I continued her home regimen of Oxycontin but added on Oxy IR for breakthrough pain.     Code Status:Full Code Family Communication: Discussed plan with patient at bedside Disposition Plan: 2-3 days, home when medically stable Time spent: 70 minutes  [facility] Pager [phone]  If 7PM-7AM, please contact night-coverage [email or web address] Password  [facility] 02/21/2012, 2:43 AM

## 2012-02-21 NOTE — ED Notes (Signed)
Fluids wide open per dr.golding due to low blood pressure

## 2012-02-21 NOTE — Progress Notes (Signed)
UR Chart Review Completed  

## 2012-02-21 NOTE — Progress Notes (Signed)
INITIAL NUTRITION ASSESSMENT  DOCUMENTATION CODES Per approved criteria  -Not Applicable   INTERVENTION: Regular diet offer snacks and hydration between meals  NUTRITION DIAGNOSIS: None at this time  Goal: Pt to meet >/= 90% of their estimated nutrition needs;  met  Monitor:  Meals, snacks, labs and wt changes  Reason for Assessment: Malnutrition Screen  57 y.o. female  Admitting Dx: Sepsis  ASSESSMENT: Pt receiving Regular diet good appetite and po intake 90% recent meal. She does not meet criteria for malnutrition at this time.  Height: Ht Readings from Last 1 Encounters:  02/21/12 5\' 2"  (1.575 m)    Weight: Wt Readings from Last 1 Encounters:  02/21/12 138 lb 3.2 oz (62.687 kg)    Ideal Body Weight: 110# (50 kg)  % Ideal Body Weight: 125%  Wt Readings from Last 10 Encounters:  02/21/12 138 lb 3.2 oz (62.687 kg)  07/24/10 126 lb (57.153 kg)  07/04/10 125 lb (56.7 kg)  05/21/10 126 lb (57.153 kg)  05/14/10 131 lb (59.421 kg)    Usual Body Weight: 125-135#  % Usual Body Weight: 102%  BMI:  Body mass index is 25.28 kg/(m^2). Overweight  Estimated Nutritional Needs: Kcal: 1400-1650 Protein: 60-70 gr Fluid: 1 ml/kcal  Skin: no issues noted  Diet Order: General  EDUCATION NEEDS: -No education needs identified at this time   Intake/Output Summary (Last 24 hours) at 02/21/12 0945 Last data filed at 02/21/12 0800  Gross per 24 hour  Intake    360 ml  Output      0 ml  Net    360 ml    Last BM:  02/20/12  Labs:   Lab 02/21/12 0433 02/20/12 2259  NA -- 126*  K -- 3.8  CL -- 89*  CO2 -- 29  BUN -- 15  CREATININE 1.23* 1.45*  CALCIUM -- 10.2  MG 1.5 --  PHOS 3.4 --  GLUCOSE -- 102*    CBG (last 3)  No results found for this basename: GLUCAP:3 in the last 72 hours  Scheduled Meds:   . azithromycin  500 mg Intravenous Q24H  . cefTRIAXone (ROCEPHIN)  IV  1 g Intravenous Q24H  . clopidogrel  75 mg Oral Q breakfast  . DULoxetine  90  mg Oral Daily  . enoxaparin (LOVENOX) injection  40 mg Subcutaneous Q24H  . levothyroxine  25 mcg Oral QAC breakfast  . OxyCODONE  30 mg Oral Q12H  . pantoprazole  40 mg Oral Daily  . piperacillin-tazobactam  3.375 g Intravenous Once  . pneumococcal 23 valent vaccine  0.5 mL Intramuscular Tomorrow-1000  . simvastatin  20 mg Oral q1800  . vancomycin  500 mg Intravenous Q12H    Continuous Infusions:   . sodium chloride 999 mL/hr at 02/21/12 0426  . sodium chloride 200 mL/hr at 02/21/12 1610    Past Medical History  Diagnosis Date  . Near syncope   . Anxiety and depression   . Hyperlipidemia   . Hypertension   . Hypothyroid   . Tobacco abuse     1/2 pack per day  . Chronic pain 11/22/2010  . Migraine headache 11/22/2010  . Gastroesophageal reflux disease 11/22/2010  . Pneumonia   . Bronchitis     Past Surgical History  Procedure Date  . Cholecystectomy 2007  . Abdominal hysterectomy     without oophorectomy for neoplastic disease  . Nasal sinus surgery   . Dilation and curettage of uterus   . Lumbar laminectomy x3  Left; complicated by neurologic dysfunction  . Laparotomy   . Carpal tunnel release     Left    [phone]

## 2012-02-21 NOTE — Progress Notes (Signed)
ANTIBIOTIC CONSULT NOTE - INITIAL  Pharmacy Consult for Vancomycin and Renal Dosing of Antibiotics Indication: pneumonia  Allergies  Allergen Reactions  . Acetaminophen-Codeine Nausea And Vomiting  . Aspirin Nausea And Vomiting  . Celecoxib   . Levaquin (Levofloxacin In D5w) Itching  . Sulfa Antibiotics   . Latex Rash    Patient Measurements: Height: 5\' 2"  (157.5 cm) Weight: 138 lb 3.2 oz (62.687 kg) IBW/kg (Calculated) : 50.1   Vital Signs: Temp: 98.2 F (36.8 C) (01/17 0509) Temp src: Oral (01/17 0509) BP: 107/50 mmHg (01/17 0509) Pulse Rate: 68  (01/17 0509) Intake/Output from previous day:   Intake/Output from this shift:    Labs:  Basename 02/21/12 0433 02/20/12 2259  WBC 16.1* 24.8*  HGB 10.4* 12.4  PLT 327 407*  LABCREA -- --  CREATININE 1.23* 1.45*   Estimated Creatinine Clearance: 45 ml/min (by C-G formula based on Cr of 1.23). No results found for this basename: VANCOTROUGH:2,VANCOPEAK:2,VANCORANDOM:2,GENTTROUGH:2,GENTPEAK:2,GENTRANDOM:2,TOBRATROUGH:2,TOBRAPEAK:2,TOBRARND:2,AMIKACINPEAK:2,AMIKACINTROU:2,AMIKACIN:2, in the last 72 hours   Microbiology: Recent Results (from the past 720 hour(s))  CULTURE, BLOOD (ROUTINE X 2)     Status: Normal (Preliminary result)   Collection Time   02/20/12 11:40 PM      Component Value Range Status Comment   Specimen Description Blood LEFT ANTECUBITAL   Final    Special Requests BOTTLES DRAWN AEROBIC AND ANAEROBIC 8CC EACH   Final    Culture PENDING   Incomplete    Report Status PENDING   Incomplete   CULTURE, BLOOD (ROUTINE X 2)     Status: Normal (Preliminary result)   Collection Time   02/20/12 11:41 PM      Component Value Range Status Comment   Specimen Description BLOOD LEFT ARM   Final    Special Requests BOTTLES DRAWN AEROBIC AND ANAEROBIC [facility] EACH   Final    Culture PENDING   Incomplete    Report Status PENDING   Incomplete     Medical History: Past Medical History  Diagnosis Date  . Near syncope     . Anxiety and depression   . Hyperlipidemia   . Hypertension   . Hypothyroid   . Tobacco abuse     1/2 pack per day  . Chronic pain 11/22/2010  . Migraine headache 11/22/2010  . Gastroesophageal reflux disease 11/22/2010  . Pneumonia   . Bronchitis     Medications:  Scheduled:    . [COMPLETED] sodium chloride   Intravenous Once  . azithromycin  500 mg Intravenous Q24H  . cefTRIAXone (ROCEPHIN)  IV  1 g Intravenous Q24H  . clopidogrel  75 mg Oral Q breakfast  . [COMPLETED] diphenhydrAMINE      . DULoxetine  90 mg Oral Daily  . enoxaparin (LOVENOX) injection  40 mg Subcutaneous Q24H  . [COMPLETED] ibuprofen  800 mg Oral Once  . [COMPLETED] levofloxacin (LEVAQUIN) IV  750 mg Intravenous Once  . levothyroxine  25 mcg Oral QAC breakfast  . OxyCODONE  30 mg Oral Q12H  . pantoprazole  40 mg Oral Daily  . piperacillin-tazobactam  3.375 g Intravenous Once  . pneumococcal 23 valent vaccine  0.5 mL Intramuscular Tomorrow-1000  . simvastatin  20 mg Oral q1800  . [COMPLETED] sodium chloride  1,000 mL Intravenous Once  . [COMPLETED] vancomycin  1,000 mg Intravenous Once  . [DISCONTINUED] aspirin  325 mg Oral Daily  . [DISCONTINUED] DULoxetine  30 mg Oral q morning - 10a  . [DISCONTINUED] DULoxetine  60 mg Oral q morning - 10a  . [  DISCONTINUED] hydrocortisone sod succinate (SOLU-CORTEF) injection  50 mg Intravenous Q8H   Assessment: Okay for Protocol Also on Azithromycin and Rocephin Estimated Creatinine Clearance: 45 ml/min (by C-G formula based on Cr of 1.23). Received Vancomycin 1000mg  IV in ED early this AM.  Goal of Therapy:  Vancomycin trough level 15-20 mcg/ml  Plan:  Vancomycin 500mg  IV every 12 hours. Measure antibiotic drug levels at steady state Follow up culture results  Mady Gemma 02/21/2012,8:28 AM

## 2012-02-21 NOTE — ED Provider Notes (Signed)
History     CSN: [id number]  Arrival date & time 02/20/12  2014   First MD Initiated Contact with Patient 02/20/12 2252      Chief Complaint  Patient presents with  . Fever    (Consider location/radiation/quality/duration/timing/severity/associated sxs/prior treatment) HPI Comments: Patient is a 57 year old female patient of Dr. Regino Schultze who presents to the emergency department with cough fever and generally not feeling well. The patient states that approximately 2-1/2 weeks ago she was diagnosed with pneumonia. She was treated in the pneumonia cleared within approximately a week but she was left with" bronchitis". The patient was treated with 2 different rounds of steroids. The patient states she developed a rib fracture on the left because of coughing so hard. She is treated for that. Today the patient was noted to have a temperature elevation of 103. She has not taken any Tylenol or Motrin for this her temperature upon arrival to the emergency department was 99.1. Patient denies any lung disease, there's been no diarrhea, no hematemesis, and no rash. The patient states that when she coughed she has a" milky looking mucus". Patient also states that she has to" suppress her bladder" in order to urinate and at times has some discomfort with this. The patient presents to the emergency department at this time for additional evaluation of these problems.  Patient is a 57 y.o. female presenting with fever. The history is provided by the patient and a friend.  Fever Primary symptoms of the febrile illness include fever, fatigue, cough and dysuria. Primary symptoms do not include wheezing, shortness of breath, abdominal pain or arthralgias. The current episode started more than 1 week ago. This is a new problem. The problem has been gradually worsening.  The dysuria is not associated with hematuria or frequency.  Risk factors: Smoker, recent pneumonia.   Past Medical History  Diagnosis Date  .  Near syncope   . Anxiety and depression   . Hyperlipidemia   . Hypertension   . Hypothyroid   . Tobacco abuse     1/2 pack per day  . Chronic pain 11/22/2010  . Migraine headache 11/22/2010  . Gastroesophageal reflux disease 11/22/2010  . Pneumonia   . Bronchitis     Past Surgical History  Procedure Date  . Cholecystectomy 2007  . Abdominal hysterectomy     without oophorectomy for neoplastic disease  . Nasal sinus surgery   . Dilation and curettage of uterus   . Lumbar laminectomy x3    Left; complicated by neurologic dysfunction  . Laparotomy   . Carpal tunnel release     Left    Family History  Problem Relation Age of Onset  . Colon polyps Mother     History  Substance Use Topics  . Smoking status: Current Every Day Smoker -- 0.5 packs/day for 40 years    Types: Cigarettes  . Smokeless tobacco: Never Used  . Alcohol Use: No    OB History    Grav Para Term Preterm Abortions TAB SAB Ect Mult Living                  Review of Systems  Constitutional: Positive for fever and fatigue. Negative for activity change.       All ROS Neg except as noted in HPI  HENT: Negative for nosebleeds and neck pain.   Eyes: Negative for photophobia and discharge.  Respiratory: Positive for cough. Negative for shortness of breath and wheezing.   Cardiovascular: Negative for chest pain  and palpitations.  Gastrointestinal: Negative for abdominal pain and blood in stool.  Genitourinary: Positive for dysuria. Negative for frequency and hematuria.  Musculoskeletal: Negative for back pain and arthralgias.  Skin: Negative.   Neurological: Negative for dizziness, seizures and speech difficulty.  Psychiatric/Behavioral: Negative for hallucinations and confusion. The patient is nervous/anxious.     Allergies  Acetaminophen-codeine; Aspirin; Celecoxib; Sulfa antibiotics; and Latex  Home Medications   Current Outpatient Rx  Name  Route  Sig  Dispense  Refill  . ALBUTEROL SULFATE 2  MG PO TABS   Oral   Take 2 mg by mouth daily as needed. For shortness of breath         . ALPRAZOLAM 1 MG PO TABS   Oral   Take 1 mg by mouth 4 (four) times daily as needed.          . CYMBALTA 30 MG PO CPEP   Oral   Take 30 mg by mouth every morning. Takes one capsule with Cymbalta 60mg  for a total of 90mg  daily in the morning         . DULOXETINE HCL 60 MG PO CPEP   Oral   Take 60 mg by mouth every morning. Takes one capsule with Cymbalta 30mg  for a total of 90mg  daily in the morning         . LEVOTHYROXINE SODIUM 25 MCG PO TABS   Oral   Take 25 mcg by mouth every morning.          Marland Kitchen MAGNESIUM 250 MG PO TABS   Oral   Take 1 tablet by mouth daily.           Marland Kitchen NEXIUM 40 MG PO CPDR   Oral   Take 40 mg by mouth every morning.          . OXYCODONE HCL ER 30 MG PO TB12   Oral   Take 30 mg by mouth every 12 (twelve) hours.             BP 86/55  Pulse 77  Temp 98.3 F (36.8 C) (Oral)  Resp 22  Ht 5\' 2"  (1.575 m)  Wt 125 lb (56.7 kg)  BMI 22.86 kg/m2  SpO2 98%  Physical Exam  Nursing note and vitals reviewed. Constitutional: She is oriented to person, place, and time. She appears well-developed and well-nourished.  Non-toxic appearance.  HENT:  Head: Normocephalic.  Right Ear: Tympanic membrane and external ear normal.  Left Ear: Tympanic membrane and external ear normal.  Eyes: EOM and lids are normal. Pupils are equal, round, and reactive to light.  Neck: Normal range of motion. Neck supple. Carotid bruit is not present.  Cardiovascular: Regular rhythm, normal heart sounds, intact distal pulses and normal pulses.  Tachycardia present.   Pulmonary/Chest: No respiratory distress. She has no rales.       Left mid rib area tender to palpation and change of position. Course breath sounds bilat. Decrease breath soun ad at the right base.  Abdominal: Soft. Bowel sounds are normal. There is no tenderness. There is no guarding.  Musculoskeletal: Normal  range of motion.  Lymphadenopathy:       Head (right side): No submandibular adenopathy present.       Head (left side): No submandibular adenopathy present.    She has no cervical adenopathy.  Neurological: She is alert and oriented to person, place, and time. She has normal strength. No cranial nerve deficit or sensory deficit. She exhibits normal muscle  tone. Coordination normal.  Skin: Skin is warm and dry. No rash noted.  Psychiatric: She has a normal mood and affect. Her speech is normal.    ED Course  CRITICAL CARE Performed by: Kathie Dike Authorized by: Kathie Dike Total critical care time: 45 minutes Critical care was necessary to treat or prevent imminent or life-threatening deterioration of the following conditions: sepsis and shock. Critical care was time spent personally by me on the following activities: development of treatment plan with patient or surrogate, evaluation of patient's response to treatment, examination of patient, obtaining history from patient or surrogate, ordering and performing treatments and interventions, ordering and review of laboratory studies, ordering and review of radiographic studies, pulse oximetry, re-evaluation of patient's condition and review of old charts.   (including critical care time)  Labs Reviewed  CBC WITH DIFFERENTIAL - Abnormal; Notable for the following:    WBC 24.8 (*)     Platelets 407 (*)     Neutrophils Relative 80 (*)     Neutro Abs 19.8 (*)     Monocytes Absolute 1.9 (*)     All other components within normal limits  BASIC METABOLIC PANEL - Abnormal; Notable for the following:    Sodium 126 (*)     Chloride 89 (*)     Glucose, Bld 102 (*)     Creatinine, Ser 1.45 (*)     GFR calc non Af Amer 40 (*)     GFR calc Af Amer 46 (*)     All other components within normal limits  LACTIC ACID, PLASMA  CULTURE, BLOOD (ROUTINE X 2)  CULTURE, BLOOD (ROUTINE X 2)  URINALYSIS, MICROSCOPIC ONLY   Dg Chest 2  View  02/20/2012  *RADIOLOGY REPORT*  Clinical Data: Fever, cough.  CHEST - 2 VIEW  Comparison: 02/06/2012  Findings: New right middle lobe airspace disease.  Coarse lower lobe interstitial markings with mild hyperinflation.  No effusion. Heart size normal.  Vascular clips in the upper abdomen.  Regional bones unremarkable.  Atheromatous aorta.  IMPRESSION:  New right middle lobe airspace consolidation suggesting pneumonia.   Original Report Authenticated By: D. Andria Rhein, MD      No diagnosis found.    MDM  I have reviewed nursing notes, vital signs, and all appropriate lab and imaging results for this patient. Patient reports a temperature max today of 103. Patient has not taken Tylenol or Motrin.Pt states she was diagnosed with pneumonia that cleared after treatment. Pt states she has a left rib fx due to cough. I have reviewed old records and recent xrays, but not documented rib fracture noted. Temperature upon arrival in the emergency department was 99.1, with a pulse rate of 109, and a blood pressure of" 86/64". IV fluids started. Blood pressure came up to 91 systolic. Labs obtained. Upon return from x-ray the patient's systolic blood pressure is now 79. IV fluid bolus given. Pt seen wth me by Dr Hyacinth Meeker.   Heart rate has improved to 92 after IV fluids. The complete blood count shows an elevated WBC of 24,800 with 80 neutrophils. The basic metabolic panel shows a slight decrease in sodium of 126, the creatinine is elevated at 145, the glomerular filtration rate is low at 40, and the glucose is elevated at 102.  At this point a second IV is obtained. Blood cultures have been ordered. Patient has been made aware of lab results up to this point. The chest x-ray returns and shows a new right  middle lobe airspace disease. IV vancomycin, Zosyn, and Levaquin have been ordered. The lactic acid is normal at 1. UA wnl.  Case discussed with Dr Phillips Odor (hospitalist). Pt to be admitted. Blood pressure  remains 86 systolic after 1st bolus of IV fluids. 2nd liter ordered. Pt having itching with levaquin. IV benadryl given.      Kathie Dike, [place] 02/21/12 [phone]

## 2012-02-22 DIAGNOSIS — G458 Other transient cerebral ischemic attacks and related syndromes: Secondary | ICD-10-CM

## 2012-02-22 DIAGNOSIS — E039 Hypothyroidism, unspecified: Secondary | ICD-10-CM

## 2012-02-22 LAB — CBC
Hemoglobin: 10.5 g/dL — ABNORMAL LOW (ref 12.0–15.0)
MCH: 31.3 pg (ref 26.0–34.0)
Platelets: 324 10*3/uL (ref 150–400)
RBC: 3.35 MIL/uL — ABNORMAL LOW (ref 3.87–5.11)

## 2012-02-22 LAB — COMPREHENSIVE METABOLIC PANEL
ALT: 9 U/L (ref 0–35)
AST: 11 U/L (ref 0–37)
Alkaline Phosphatase: 75 U/L (ref 39–117)
CO2: 28 mEq/L (ref 19–32)
Calcium: 8.5 mg/dL (ref 8.4–10.5)
GFR calc non Af Amer: 57 mL/min — ABNORMAL LOW (ref >90)
Glucose, Bld: 79 mg/dL (ref 70–99)
Potassium: 3.7 mEq/L (ref 3.5–5.1)
Sodium: 142 mEq/L (ref 135–145)
Total Protein: 5.3 g/dL — ABNORMAL LOW (ref 6.0–8.3)

## 2012-02-22 LAB — EXPECTORATED SPUTUM ASSESSMENT W GRAM STAIN, RFLX TO RESP C

## 2012-02-22 LAB — STREP PNEUMONIAE URINARY ANTIGEN: Strep Pneumo Urinary Antigen: NEGATIVE

## 2012-02-22 MED ORDER — CEFUROXIME AXETIL 500 MG PO TABS: 500.0000 mg | ORAL_TABLET | Freq: Two times a day (BID) | ORAL | Status: DC

## 2012-02-22 MED ORDER — SIMVASTATIN 20 MG PO TABS: 20.0000 mg | ORAL_TABLET | Freq: Every day | ORAL | Status: DC

## 2012-02-22 MED ORDER — AZITHROMYCIN 500 MG PO TABS: 500.0000 mg | ORAL_TABLET | Freq: Every day | ORAL | Status: DC

## 2012-02-22 MED ORDER — CLOPIDOGREL BISULFATE 75 MG PO TABS: 75.0000 mg | ORAL_TABLET | Freq: Every day | ORAL | Status: DC

## 2012-02-22 NOTE — Progress Notes (Signed)
Patient states her pain never drops or is lowered because she is staying in the bed that walking around helps. I did offer her to walk the halls several times and she refused because she did not want to walk eith her foley although i offered to carry it. She is also wanting the foley tobe removed today and plans to ask about it.

## 2012-02-22 NOTE — Discharge Summary (Signed)
Physician Discharge Summary  ABCDE ONEIL JXB:[id number] DOB: December 10, 1955 DOA: 02/20/2012  PCP: Cassell Smiles., MD  Admit date: 02/20/2012 Discharge date: 02/22/2012  Time spent: Greater than 30 minutes  Recommendations for Outpatient Follow-up:  1. Followup with primary care physician in one to 2 weeks. 2. Follow with vascular surgery for subclavian steal syndrome.   Discharge Diagnoses:  1. Community-acquired pneumonia, right middle lobe, clinically improving. 2. Subclavian steal syndrome. 3. Hypothyroidism. 4. Ongoing tobacco abuse.   Discharge Condition: Stable and improved.  Diet recommendation: Heart healthy.  Filed Weights   02/20/12 2033 02/21/12 0509  Weight: 56.7 kg (125 lb) 62.687 kg (138 lb 3.2 oz)    History of present illness:  This lady presented to the hospital with symptoms of fever, cough and rib pain on the left side. Please see initial history as outlined below: HPI: [name] is a 57 y.o. female with a PMH significant for anxiety/depression, HTN, HLD and ongoing tobacco abuse who presented to ED complaining of two-three week history of progressively worsening respiratory symptoms with development of fever of 103 today and severe weakness. Pertinent history goes back 4 weeks ago when she fell on the edge of her tub while hanging a shower curtain and injured her left side-she has trouble with the history details, but she states she was seen by her PCP and was told she had a rib fracture. She has had severe pain and was fairly immobile after her injury-was "splinting" and has had to hold a pillow to stabilize her left rib region with movement and coughing since her fall. About 1 week after her fall she developed URI symptoms, cough with productive sputum and was treated with "3 different antibiotics" by her PCP-she also states that she has been on steroids for the past month but stopped taking them about 1 week ago because she completed them.  In the ED she  has a low grade fever, RML PNA on CXR, significant WBC elevation, hypotension and acute renal failure. Admission requested for sepsis, hypotension and PNA.  Hospital Course:  The patient was admitted and treated for community-acquired pneumonia with intravenous Rocephin and Zithromax. During hospitalization she was noted to have different blood pressures on both her arms and this led to the possibility of subclavian steal syndrome. Therefore she underwent bilateral carotid Dopplers and indeed this showed a she probably did have this condition. I've spoken with vascular surgery, who want to see her as an outpatient. In the meantime we will initiate risk modification and she has been counseled against tobacco abuse. Plavix has been added as well as a statin. Vascular surgery will call her to schedule an appointment. She is doing well, does not require supplemental oxygen in the hospital. She still has left-sided rib pain from her fall, this is helped by analgesics.  Procedures:  None.   Consultations:  None.  Discharge Exam: Filed Vitals:   02/21/12 0509 02/21/12 1512 02/21/12 2218 02/22/12 0500  BP: 107/50 94/60 137/68 131/63  Pulse: 68 83 88 86  Temp: 98.2 F (36.8 C) 98.2 F (36.8 C) 98 F (36.7 C) 97.7 F (36.5 C)  TempSrc: Oral Oral Oral Oral  Resp: 16 18 18 18   Height: 5\' 2"  (1.575 m)     Weight: 62.687 kg (138 lb 3.2 oz)     SpO2: 95% 99% 98%     General: She looks systemically well. She is not toxic or septic. Cardiovascular: Heart sounds are present without murmurs or gallop rhythm. She  does have bilateral carotid bruits. Respiratory: Lung fields are entirely clear with no bronchial breathing or crackles.  Discharge Instructions  Discharge Orders    Future Orders Please Complete By Expires   Diet - low sodium heart healthy      Increase activity slowly          Medication List     As of 02/22/2012 11:06 AM    TAKE these medications         albuterol 2 MG tablet     Commonly known as: PROVENTIL   Take 2 mg by mouth daily as needed. For shortness of breath      ALPRAZolam 1 MG tablet   Commonly known as: XANAX   Take 1 mg by mouth 4 (four) times daily as needed.      azithromycin 500 MG tablet   Commonly known as: ZITHROMAX   Take 1 tablet (500 mg total) by mouth daily.      cefUROXime 500 MG tablet   Commonly known as: CEFTIN   Take 1 tablet (500 mg total) by mouth 2 (two) times daily.      clopidogrel 75 MG tablet   Commonly known as: PLAVIX   Take 1 tablet (75 mg total) by mouth daily with breakfast.      DULoxetine 60 MG capsule   Commonly known as: CYMBALTA   Take 60 mg by mouth every morning. Takes one capsule with Cymbalta 30mg  for a total of 90mg  daily in the morning      CYMBALTA 30 MG capsule   Generic drug: DULoxetine   Take 30 mg by mouth every morning. Takes one capsule with Cymbalta 60mg  for a total of 90mg  daily in the morning      LEVOTHROID 25 MCG tablet   Generic drug: levothyroxine   Take 25 mcg by mouth every morning.      Magnesium 250 MG Tabs   Take 1 tablet by mouth daily.      NEXIUM 40 MG capsule   Generic drug: esomeprazole   Take 40 mg by mouth every morning.      oxycodone 30 MG Tb12   Commonly known as: OXYCONTIN   Take 30 mg by mouth every 12 (twelve) hours.      simvastatin 20 MG tablet   Commonly known as: ZOCOR   Take 1 tablet (20 mg total) by mouth daily at 6 PM.          The results of significant diagnostics from this hospitalization (including imaging, microbiology, ancillary and laboratory) are listed below for reference.    Significant Diagnostic Studies: Dg Chest 2 View  02/20/2012  *RADIOLOGY REPORT*  Clinical Data: Fever, cough.  CHEST - 2 VIEW  Comparison: 02/06/2012  Findings: New right middle lobe airspace disease.  Coarse lower lobe interstitial markings with mild hyperinflation.  No effusion. Heart size normal.  Vascular clips in the upper abdomen.  Regional bones  unremarkable.  Atheromatous aorta.  IMPRESSION:  New right middle lobe airspace consolidation suggesting pneumonia.   Original Report Authenticated By: D. Andria Rhein, MD    Dg Chest 2 View  02/06/2012  *RADIOLOGY REPORT*  Clinical Data: Cough and congestion.  CHEST - 2 VIEW  Comparison: 10/03/2008.  Findings: The cardiac silhouette, mediastinal and hilar contours are within normal limits and stable.  There are chronic emphysematous and bronchitic type lung changes but no acute pulmonary findings.  Minimal streaky basilar scarring changes.  The bony thorax is intact.  IMPRESSION: Chronic  lung changes but no acute pulmonary findings.   Original Report Authenticated By: Rudie Meyer, M.D.    US Carotid Duplex Bilateral  02/21/2012  *RADIOLOGY REPORT*  Clinical Data: Possible subclavian steal syndrome.  BILATERAL CAROTID DUPLEX ULTRASOUND  Technique: Wallace Cullens scale imaging, color Doppler and duplex ultrasound was performed of bilateral carotid and vertebral arteries in the neck.  Comparison:  None.  Criteria:  Quantification of carotid stenosis is based on velocity parameters that correlate the residual internal carotid diameter with NASCET-based stenosis levels, using the diameter of the distal internal carotid lumen as the denominator for stenosis measurement.  The following velocity measurements were obtained:                   PEAK SYSTOLIC/END DIASTOLIC RIGHT ICA:                        120cm/sec CCA:                        136cm/sec SYSTOLIC ICA/CCA RATIO:     0.9 DIASTOLIC ICA/CCA RATIO:    1.0 ECA:                        282cm/sec  LEFT ICA:                        185cm/sec CCA:                        140cm/sec SYSTOLIC ICA/CCA RATIO:     1.3 DIASTOLIC ICA/CCA RATIO:    1.5 ECA:                        232cm/sec  Findings:  RIGHT CAROTID ARTERY: Intimal thickening in the distal common carotid artery and carotid bulb.  Elevated velocity in the external carotid artery.  Normal peak systolic velocity waveforms in  the right internal carotid artery.  RIGHT VERTEBRAL ARTERY:  Antegrade flow and normal waveform in the right vertebral artery.  LEFT CAROTID ARTERY: Intimal thickening in the left common carotid artery.  There is plaque at the left carotid bulb along with intimal thickening.  Slightly elevated velocity in the external carotid artery.  There is mild plaque in the proximal internal carotid artery.  Peak systolic velocity in the distal internal carotid artery is elevated measuring 185 cm/sec.  LEFT VERTEBRAL ARTERY:  There is retrograde flow in the left vertebral artery.  IMPRESSION:  Retrograde flow in the left vertebral artery.  This finding can be associated with central left subclavian artery stenosis and subclavian steal syndrome.  Mild-moderate plaque in the carotid arteries.  The estimated degree of stenosis in the right internal carotid artery is less than 50%.  The estimated degree of stenosis in the left distal internal carotid artery may be 50-69% based on the peak systolic velocity. However, the estimated degree of stenosis in the left internal carotid artery is likely an overestimated because the left common carotid artery velocity is also elevated.  Probable stenosis involving the external carotid arteries.   Original Report Authenticated By: Richarda Overlie, M.D.     Microbiology: Recent Results (from the past 240 hour(s))  CULTURE, BLOOD (ROUTINE X 2)     Status: Normal (Preliminary result)   Collection Time   02/20/12 11:40 PM      Component Value Range Status Comment  Specimen Description Blood LEFT ANTECUBITAL   Final    Special Requests BOTTLES DRAWN AEROBIC AND ANAEROBIC 8CC EACH   Final    Culture NO GROWTH 2 DAYS   Final    Report Status PENDING   Incomplete   CULTURE, BLOOD (ROUTINE X 2)     Status: Normal (Preliminary result)   Collection Time   02/20/12 11:41 PM      Component Value Range Status Comment   Specimen Description BLOOD LEFT ARM   Final    Special Requests BOTTLES DRAWN  AEROBIC AND ANAEROBIC 8CC EACH   Final    Culture NO GROWTH 2 DAYS   Final    Report Status PENDING   Incomplete   CULTURE, BLOOD (ROUTINE X 2)     Status: Normal (Preliminary result)   Collection Time   02/21/12  4:33 AM      Component Value Range Status Comment   Specimen Description Blood   Final    Special Requests NONE   Final    Culture NO GROWTH 1 DAY   Final    Report Status PENDING   Incomplete   CULTURE, BLOOD (ROUTINE X 2)     Status: Normal (Preliminary result)   Collection Time   02/21/12  4:41 AM      Component Value Range Status Comment   Specimen Description Blood   Final    Special Requests NONE   Final    Culture NO GROWTH 1 DAY   Final    Report Status PENDING   Incomplete      Labs: Basic Metabolic Panel:  Lab 02/22/12 4540 02/21/12 0433 02/20/12 2259  NA 142 -- 126*  K 3.7 -- 3.8  CL 108 -- 89*  CO2 28 -- 29  GLUCOSE 79 -- 102*  BUN 11 -- 15  CREATININE 1.08 1.23* 1.45*  CALCIUM 8.5 -- 10.2  MG -- 1.5 --  PHOS -- 3.4 --   Liver Function Tests:  Lab 02/22/12 0419  AST 11  ALT 9  ALKPHOS 75  BILITOT 0.1*  PROT 5.3*  ALBUMIN 2.3*     CBC:  Lab 02/22/12 0419 02/21/12 0433 02/20/12 2259  WBC 11.9* 16.1* 24.8*  NEUTROABS -- -- 19.8*  HGB 10.5* 10.4* 12.4  HCT 32.4* 31.3* 36.7  MCV 96.7 94.8 93.9  PLT 324 327 407*       Signed:  Phoebie Shad C  [facility] 02/22/2012, 11:06 AM

## 2012-02-22 NOTE — Progress Notes (Signed)
Patient discharged with instructions, prescriptions and care notes.  Pt verbalized understanding.  Dennie Bible has voided since foley has been d/c'd today and her O2 sats were 95% room air after O2 of x40 mins.  She has ambulated greater than 200 feet without any problems voiced or difficulty.  She left the floor via w/c with staff and family in stable condition.

## 2012-02-25 LAB — LEGIONELLA ANTIGEN, URINE

## 2012-02-25 LAB — CULTURE, BLOOD (ROUTINE X 2): Culture: NO GROWTH

## 2012-02-26 LAB — CULTURE, BLOOD (ROUTINE X 2): Culture: NO GROWTH

## 2012-03-05 ENCOUNTER — Encounter: Payer: Self-pay | Admitting: Vascular Surgery

## 2012-03-06 ENCOUNTER — Encounter: Payer: Self-pay | Admitting: Vascular Surgery

## 2012-03-06 ENCOUNTER — Ambulatory Visit (INDEPENDENT_AMBULATORY_CARE_PROVIDER_SITE_OTHER): Payer: Medicare Other | Admitting: Vascular Surgery

## 2012-03-06 ENCOUNTER — Encounter: Payer: Medicare Other | Admitting: Vascular Surgery

## 2012-03-06 VITALS — BP 150/70 | HR 91 | Resp 18 | Ht 62.5 in | Wt 130.0 lb

## 2012-03-06 DIAGNOSIS — G458 Other transient cerebral ischemic attacks and related syndromes: Secondary | ICD-10-CM

## 2012-03-06 DIAGNOSIS — I658 Occlusion and stenosis of other precerebral arteries: Secondary | ICD-10-CM

## 2012-03-06 DIAGNOSIS — I6523 Occlusion and stenosis of bilateral carotid arteries: Secondary | ICD-10-CM

## 2012-03-06 NOTE — Progress Notes (Signed)
[facility]  New Carotid Patient  Referred by:  Wilson Singer, MD [address] [place], [place] [address]  Reason for referral: L subclavian steal and B carotid stenosis  History of Present Illness  [name] is a 57 y.o. (05/02/1955) female who presents with chief complaint: shortness of breath.  This patient was recently hospitalized for pneumonia.  During her admission differential blood pressures were noted in both arms.  Subsequently a carotid duplex was obtained: RICA <50% stenosis, LICA 50-69% stenosis.  Patient has also had retrograde flow in L [place].  The patient has no history of TIA or stroke symptom.  The patient has never had amaurosis fugax or monocular blindness.  The patient has never had facial drooping or hemiplegia.  The patient has never had receptive or expressive aphasia.   The patient's risks factors for carotid disease include: HTN, hyperlipidemia, and smoking.  She notes mild numbness in L hand that is intermittent, but notes no difficulties with completing ADLs with her left hand.  She has no claudication analogous sx in the L hand.  Past Medical History  Diagnosis Date  . Near syncope   . Anxiety and depression   . Hyperlipidemia   . Hypertension   . Hypothyroid   . Tobacco abuse     1/2 pack per day  . Chronic pain 11/22/2010  . Migraine headache 11/22/2010  . Gastroesophageal reflux disease 11/22/2010  . Pneumonia   . Bronchitis     Past Surgical History  Procedure Date  . Cholecystectomy 2007  . Abdominal hysterectomy     without oophorectomy for neoplastic disease  . Nasal sinus surgery   . Dilation and curettage of uterus   . Lumbar laminectomy x3    Left; complicated by neurologic dysfunction  . Laparotomy   . Carpal tunnel release     Left    History   Social History  . Marital Status: Married    Spouse Name: N/A    Number of Children: 2  . Years of Education: N/A   Occupational History  .       Disabled   Social History Main Topics  . Smoking status: Current Every Day Smoker -- 0.5 packs/day for 40 years    Types: Cigarettes  . Smokeless tobacco: Never Used  . Alcohol Use: No  . Drug Use: No  . Sexually Active: Not Currently   Other Topics Concern  . Not on file   Social History Narrative  . No narrative on file    Family History  Problem Relation Age of Onset  . Colon polyps Mother     Current Outpatient Prescriptions on File Prior to Visit  Medication Sig Dispense Refill  . albuterol (PROVENTIL) 2 MG tablet Take 2 mg by mouth daily as needed. For shortness of breath      . ALPRAZolam (XANAX) 1 MG tablet Take 1 mg by mouth 4 (four) times daily as needed.       Marland Kitchen azithromycin (ZITHROMAX) 500 MG tablet Take 1 tablet (500 mg total) by mouth daily.  5 tablet  0  . cefUROXime (CEFTIN) 500 MG tablet Take 1 tablet (500 mg total) by mouth 2 (two) times daily.  14 tablet  0  . clopidogrel (PLAVIX) 75 MG tablet Take 1 tablet (75 mg total) by mouth daily with breakfast.  30 tablet  0  . CYMBALTA 30 MG capsule Take 30 mg by mouth every morning. Takes one capsule  with Cymbalta 60mg  for a total of 90mg  daily in the morning      . DULoxetine (CYMBALTA) 60 MG capsule Take 60 mg by mouth every morning. Takes one capsule with Cymbalta 30mg  for a total of 90mg  daily in the morning      . levothyroxine (LEVOTHROID) 25 MCG tablet Take 25 mcg by mouth every morning.       . Magnesium 250 MG TABS Take 1 tablet by mouth daily.        Marland Kitchen NEXIUM 40 MG capsule Take 40 mg by mouth every morning.       Marland Kitchen oxycodone (OXYCONTIN) 30 MG TB12 Take 30 mg by mouth every 12 (twelve) hours.        . simvastatin (ZOCOR) 20 MG tablet Take 1 tablet (20 mg total) by mouth daily at 6 PM.  30 tablet  0  . XOPENEX HFA 45 MCG/ACT inhaler         Allergies  Allergen Reactions  . Acetaminophen-Codeine Nausea And Vomiting  . Adhesive (Tape)   . Aspirin Nausea And Vomiting  . Celecoxib   . Levaquin  (Levofloxacin In D5w) Itching  . Sulfa Antibiotics   . Latex Rash    REVIEW OF SYSTEMS:  (Positives checked otherwise negative)  CARDIOVASCULAR:  [ ]  chest pain, [ ]  chest pressure, [ ]  palpitations, [ ]  shortness of breath when laying flat, [ ]  shortness of breath with exertion,   [ ]  pain in feet when walking, [ ]  pain in feet when laying flat, [ ]  history of blood clot in veins (DVT), [ ]  history of phlebitis, [x]  swelling in legs, [ ]  varicose veins  PULMONARY:  [x]  productive cough, [ ]  asthma, [ ]  wheezing  NEUROLOGIC:  [x]  weakness in arms or legs, [x]  numbness in arms or legs, [ ]  difficulty speaking or slurred speech, [ ]  temporary loss of vision in one eye, [x]  dizziness  HEMATOLOGIC:  [ ]  bleeding problems, [ ]  problems with blood clotting too easily  MUSCULOSKEL:  [ ]  joint pain, [ ]  joint swelling  GASTROINTEST:  [ ]   Vomiting blood, [ ]   Blood in stool     GENITOURINARY:  [ ]   Burning with urination, [ ]   Blood in urine  PSYCHIATRIC:  [x]  history of major depression  INTEGUMENTARY:  [ ]  rashes, [ ]  ulcers  CONSTITUTIONAL:  [ ]  fever, [ ]  chills  Physical Examination  Filed Vitals:   03/06/12 1351 03/06/12 1352  BP: 126/76 150/70  Pulse: 79 91  Resp: 18   Height: 5' 2.5" (1.588 m)   Weight: 130 lb (58.968 kg)    Body mass index is 23.40 kg/(m^2).  General: A&O x 3, WDWN, strong odor of cigarettes  Head: [place]/AT  Ear/Nose/Throat: Hearing grossly intact, nares w/o erythema or drainage, oropharynx w/o Erythema/Exudate  Eyes: PERRLA, EOMI  Neck: Supple, no nuchal rigidity, no palpable LAD  Pulmonary: Sym exp, good air movt, CTAB, no rales, rhonchi, & wheezing  Cardiac: RRR, Nl S1, S2, no Murmurs, rubs or gallops  Vascular: Vessel Right Left  Radial Palpable Not Palpable  Ulnar Not Palpable Not Palpable  Brachial Palpable Not Palpable  Carotid Palpable, with bruit Palpable, without bruit  Aorta Not palpable N/A  Femoral Palpable Palpable  Popliteal  Not palpable Not palpable  PT Not Palpable  Not Palpable  DP Faintly Palpable Faintly Palpable   Gastrointestinal: soft, NTND, -G/R, - HSM, - masses, - CVAT B  Musculoskeletal: M/S 5/5 throughout ,  Extremities without ischemic changes   Neurologic: CN 2-12 intact , Pain and light touch intact in extremities , Motor exam as listed above  Psychiatric: Judgment intact, Mood & affect appropriate for pt's clinical situation  Dermatologic: See M/S exam for extremity exam, no rashes otherwise noted  Lymph : No Cervical, Axillary, or Inguinal lymphadenopathy   Outside Studies/Documentation 6 pages of outside documents were reviewed including: recent admission and recent carotid duplex.  Medical Decision Making  [name] is a 57 y.o. female who presents with: asx B ICA stenosis, likely L SCA stenosis.   Based on the patient's vascular studies and examination, I have offered the patient: q6 mon B carotid duplexes.  No intervention on the L SCA stenosis vs occlusion needs to be completed immediately.  I discussed in depth with the patient the nature of atherosclerosis, and emphasized the importance of maximal medical management including strict control of blood pressure, blood glucose, and lipid levels, obtaining regular exercise, antiplatelet agents, and cessation of smoking.    The patient is aware that without maximal medical management the underlying atherosclerotic disease process will progress, limiting the benefit of any interventions.  Thank you for allowing [place] to participate in this patient's care.  Leonides Sake, MD [facility] Office: [phone] Pager: [phone]  03/06/2012, 5:07 PM

## 2012-03-09 NOTE — Addendum Note (Signed)
Addended by: Sharee Pimple on: 03/09/2012 08:36 AM   Modules accepted: Orders

## 2012-03-12 ENCOUNTER — Other Ambulatory Visit (HOSPITAL_COMMUNITY): Payer: Self-pay | Admitting: Family Medicine

## 2012-03-12 ENCOUNTER — Ambulatory Visit (HOSPITAL_COMMUNITY)
Admission: RE | Admit: 2012-03-12 | Discharge: 2012-03-12 | Disposition: A | Discharge status: Home / Self Care | Payer: Medicare Other | Source: Ambulatory Visit | Attending: Family Medicine | Admitting: Family Medicine

## 2012-03-12 DIAGNOSIS — S2239XA Fracture of one rib, unspecified side, initial encounter for closed fracture: Secondary | ICD-10-CM | POA: Insufficient documentation

## 2012-03-12 DIAGNOSIS — X58XXXA Exposure to other specified factors, initial encounter: Secondary | ICD-10-CM | POA: Insufficient documentation

## 2012-03-12 DIAGNOSIS — R079 Chest pain, unspecified: Secondary | ICD-10-CM

## 2012-05-25 ENCOUNTER — Other Ambulatory Visit (HOSPITAL_COMMUNITY): Payer: Self-pay | Admitting: Internal Medicine

## 2012-05-25 DIAGNOSIS — Z0000 Encounter for general adult medical examination without abnormal findings: Secondary | ICD-10-CM

## 2012-05-28 ENCOUNTER — Ambulatory Visit (HOSPITAL_COMMUNITY): Payer: Medicare Other

## 2012-05-28 ENCOUNTER — Ambulatory Visit (HOSPITAL_COMMUNITY)
Admission: RE | Admit: 2012-05-28 | Discharge: 2012-05-28 | Disposition: A | Discharge status: Home / Self Care | Payer: Medicare Other | Source: Ambulatory Visit | Attending: Internal Medicine | Admitting: Internal Medicine

## 2012-05-28 ENCOUNTER — Other Ambulatory Visit (HOSPITAL_COMMUNITY): Payer: Medicare Other

## 2012-05-28 DIAGNOSIS — Z780 Asymptomatic menopausal state: Secondary | ICD-10-CM | POA: Insufficient documentation

## 2012-05-28 DIAGNOSIS — Z0000 Encounter for general adult medical examination without abnormal findings: Secondary | ICD-10-CM

## 2012-05-28 DIAGNOSIS — M899 Disorder of bone, unspecified: Secondary | ICD-10-CM | POA: Insufficient documentation

## 2012-05-28 DIAGNOSIS — Z13820 Encounter for screening for osteoporosis: Secondary | ICD-10-CM | POA: Insufficient documentation

## 2012-05-28 DIAGNOSIS — M949 Disorder of cartilage, unspecified: Secondary | ICD-10-CM | POA: Insufficient documentation

## 2012-05-29 ENCOUNTER — Inpatient Hospital Stay (HOSPITAL_COMMUNITY): Admission: RE | Admit: 2012-05-29 | Payer: Medicare Other | Source: Ambulatory Visit

## 2012-07-28 ENCOUNTER — Ambulatory Visit (HOSPITAL_COMMUNITY): Payer: Medicare Other

## 2012-10-06 ENCOUNTER — Telehealth: Payer: Self-pay

## 2012-10-06 NOTE — Telephone Encounter (Signed)
Pt was referred by Dr. Sherwood Gambler for screening colonoscopy. OV due to meds on 11/04/2012 with AS at 2:30 PM.

## 2012-10-08 ENCOUNTER — Other Ambulatory Visit (HOSPITAL_COMMUNITY): Payer: Self-pay | Admitting: Neurological Surgery

## 2012-10-08 DIAGNOSIS — M545 Low back pain, unspecified: Secondary | ICD-10-CM

## 2012-10-14 ENCOUNTER — Ambulatory Visit (HOSPITAL_COMMUNITY)
Admission: RE | Admit: 2012-10-14 | Discharge: 2012-10-14 | Disposition: A | Discharge status: Home / Self Care | Payer: Medicare Other | Source: Ambulatory Visit | Attending: Neurological Surgery | Admitting: Neurological Surgery

## 2012-10-14 ENCOUNTER — Other Ambulatory Visit (HOSPITAL_COMMUNITY): Payer: Self-pay | Admitting: Internal Medicine

## 2012-10-14 DIAGNOSIS — M545 Low back pain, unspecified: Secondary | ICD-10-CM

## 2012-10-14 DIAGNOSIS — M542 Cervicalgia: Secondary | ICD-10-CM | POA: Insufficient documentation

## 2012-10-14 DIAGNOSIS — M5380 Other specified dorsopathies, site unspecified: Secondary | ICD-10-CM | POA: Insufficient documentation

## 2012-10-14 DIAGNOSIS — Z981 Arthrodesis status: Secondary | ICD-10-CM | POA: Insufficient documentation

## 2012-10-14 DIAGNOSIS — Z09 Encounter for follow-up examination after completed treatment for conditions other than malignant neoplasm: Secondary | ICD-10-CM

## 2012-10-14 DIAGNOSIS — R209 Unspecified disturbances of skin sensation: Secondary | ICD-10-CM | POA: Insufficient documentation

## 2012-10-14 MED ORDER — GADOBENATE DIMEGLUMINE 529 MG/ML IV SOLN
10.0000 mL | Freq: Once | INTRAVENOUS | Status: AC | PRN
  Administered 2012-10-14: 10 mL via INTRAVENOUS

## 2012-10-19 ENCOUNTER — Ambulatory Visit (HOSPITAL_COMMUNITY)
Admission: RE | Admit: 2012-10-19 | Discharge: 2012-10-19 | Disposition: A | Discharge status: Home / Self Care | Payer: Medicare Other | Source: Ambulatory Visit | Attending: Internal Medicine | Admitting: Internal Medicine

## 2012-10-19 DIAGNOSIS — Z09 Encounter for follow-up examination after completed treatment for conditions other than malignant neoplasm: Secondary | ICD-10-CM

## 2012-11-04 ENCOUNTER — Telehealth: Payer: Self-pay | Admitting: Gastroenterology

## 2012-11-04 ENCOUNTER — Ambulatory Visit: Payer: Medicare Other | Admitting: Gastroenterology

## 2012-11-04 NOTE — Telephone Encounter (Signed)
Pt was a no show

## 2012-11-04 NOTE — Telephone Encounter (Signed)
Second no-show since Dec 2013. Please offer reschedule.

## 2012-11-05 NOTE — Telephone Encounter (Signed)
Mailed letter °

## 2012-11-25 ENCOUNTER — Ambulatory Visit (HOSPITAL_COMMUNITY)
Admission: RE | Admit: 2012-11-25 | Discharge: 2012-11-25 | Disposition: A | Discharge status: Home / Self Care | Payer: Medicare Other | Source: Ambulatory Visit | Attending: Internal Medicine | Admitting: Internal Medicine

## 2012-11-25 ENCOUNTER — Other Ambulatory Visit (HOSPITAL_COMMUNITY): Payer: Self-pay | Admitting: Internal Medicine

## 2012-11-25 ENCOUNTER — Ambulatory Visit: Payer: Medicare Other | Admitting: Gastroenterology

## 2012-11-25 DIAGNOSIS — M549 Dorsalgia, unspecified: Secondary | ICD-10-CM

## 2012-11-25 DIAGNOSIS — R0789 Other chest pain: Secondary | ICD-10-CM | POA: Insufficient documentation

## 2012-12-03 ENCOUNTER — Encounter: Payer: Self-pay | Admitting: Cardiovascular Disease

## 2012-12-03 ENCOUNTER — Ambulatory Visit (INDEPENDENT_AMBULATORY_CARE_PROVIDER_SITE_OTHER): Payer: Medicare Other | Admitting: Cardiovascular Disease

## 2012-12-03 ENCOUNTER — Encounter: Payer: Self-pay | Admitting: *Deleted

## 2012-12-03 VITALS — BP 110/74 | HR 77 | Ht 62.0 in | Wt 135.0 lb

## 2012-12-03 DIAGNOSIS — F17200 Nicotine dependence, unspecified, uncomplicated: Secondary | ICD-10-CM

## 2012-12-03 DIAGNOSIS — I6529 Occlusion and stenosis of unspecified carotid artery: Secondary | ICD-10-CM

## 2012-12-03 DIAGNOSIS — R079 Chest pain, unspecified: Secondary | ICD-10-CM

## 2012-12-03 DIAGNOSIS — I708 Atherosclerosis of other arteries: Secondary | ICD-10-CM

## 2012-12-03 DIAGNOSIS — I6523 Occlusion and stenosis of bilateral carotid arteries: Secondary | ICD-10-CM

## 2012-12-03 DIAGNOSIS — E785 Hyperlipidemia, unspecified: Secondary | ICD-10-CM

## 2012-12-03 DIAGNOSIS — I658 Occlusion and stenosis of other precerebral arteries: Secondary | ICD-10-CM

## 2012-12-03 DIAGNOSIS — R55 Syncope and collapse: Secondary | ICD-10-CM

## 2012-12-03 DIAGNOSIS — Z7189 Other specified counseling: Secondary | ICD-10-CM

## 2012-12-03 DIAGNOSIS — Z716 Tobacco abuse counseling: Secondary | ICD-10-CM

## 2012-12-03 DIAGNOSIS — I771 Stricture of artery: Secondary | ICD-10-CM

## 2012-12-03 DIAGNOSIS — I10 Essential (primary) hypertension: Secondary | ICD-10-CM

## 2012-12-03 MED ORDER — ATORVASTATIN CALCIUM 20 MG PO TABS: 20.0000 mg | ORAL_TABLET | Freq: Every evening | ORAL | Status: DC

## 2012-12-03 NOTE — Patient Instructions (Signed)
Your physician has requested that you have a dobutamine myoview. For furth information please visit [email or web address] Please follow instruction sheet, as given. Office will contact with results via phone or letter.   Begin Lipitor 20mg  every evening - new sent to pharm Continue all other medications.   Follow up in  3 months

## 2012-12-03 NOTE — Progress Notes (Signed)
Patient ID: [name], female   DOB: Nov 08, 1955, 57 y.o.   MRN: [id number]      SUBJECTIVE: [name] has a h/o bilateral carotid artery stenosis, left subclavian artery stenosis, HTN (although she denies this), hyperlipidemia (not on a statin), tobacco abuse, and syncope in the past. She denies chest pain with exertion. When she swallows, she gets a shooting pain in the right side of her neck. She has chronic dyspnea from COPD, and tells me she has sleep apnea, but does not use CPAP. She smokes about one pack over the course of 3 days. She passed out last week after getting out of a warm shower, and hit her head. She thinks she loses consciousness for a minute. She has no warning symptoms. There was some suggestion of psychogenic causes as per prior office visits. She also has a h/o migraines.  She has left arm numbness about twice a week. She has chest pain which she attributes to GERD. She has a h/o MI and CVA in her family, particularly with her mother and 2 brothers who have CAD. She gets leg cramps primarily at night, and not much with exertion. She's under a lot of stress due to her husband being in poor health.  She is very close friends with [name], who is also my patient.    Allergies  Allergen Reactions  . Acetaminophen-Codeine Nausea And Vomiting  . Adhesive [Tape]   . Aspirin Nausea And Vomiting  . Celecoxib   . Levaquin [Levofloxacin In D5w] Itching  . Sulfa Antibiotics   . Latex Rash    Current Outpatient Prescriptions  Medication Sig Dispense Refill  . albuterol (PROVENTIL) 2 MG tablet Take 2 mg by mouth daily as needed. For shortness of breath      . ALPRAZolam (XANAX) 1 MG tablet Take 1 mg by mouth 4 (four) times daily as needed.       . DULoxetine (CYMBALTA) 60 MG capsule Take 60 mg by mouth every morning. Takes one capsule with Cymbalta 30mg  for a total of 90mg  daily in the morning      . levothyroxine (LEVOTHROID) 25 MCG tablet Take 25 mcg  by mouth every morning.       . Magnesium 250 MG TABS Take 1 tablet by mouth daily.        Marland Kitchen NEXIUM 40 MG capsule Take 40 mg by mouth every morning.       . nitrofurantoin, macrocrystal-monohydrate, (MACROBID) 100 MG capsule       . oxycodone (OXYCONTIN) 30 MG TB12 Take 30 mg by mouth every 12 (twelve) hours.        Marland Kitchen oxycodone (ROXICODONE) 30 MG immediate release tablet       . Oxycodone HCl 20 MG TABS       . predniSONE (DELTASONE) 10 MG tablet       . simvastatin (ZOCOR) 20 MG tablet Take 1 tablet (20 mg total) by mouth daily at 6 PM.  30 tablet  0  . XOPENEX HFA 45 MCG/ACT inhaler        No current facility-administered medications for this visit.    Past Medical History  Diagnosis Date  . Near syncope   . Anxiety and depression   . Hyperlipidemia   . Hypertension   . Hypothyroid   . Tobacco abuse     1/2 pack per day  . Chronic pain 11/22/2010  . Migraine headache 11/22/2010  . Gastroesophageal reflux disease 11/22/2010  . Pneumonia   .  Bronchitis     Past Surgical History  Procedure Laterality Date  . Cholecystectomy  2007  . Abdominal hysterectomy      without oophorectomy for neoplastic disease  . Nasal sinus surgery    . Dilation and curettage of uterus    . Lumbar laminectomy  x3    Left; complicated by neurologic dysfunction  . Laparotomy    . Carpal tunnel release      Left    History   Social History  . Marital Status: Married    Spouse Name: N/A    Number of Children: 2  . Years of Education: N/A   Occupational History  .      Disabled   Social History Main Topics  . Smoking status: Current Every Day Smoker -- 0.50 packs/day for 40 years    Types: Cigarettes  . Smokeless tobacco: Never Used  . Alcohol Use: No  . Drug Use: No  . Sexual Activity: Not Currently   Other Topics Concern  . Not on file   Social History Narrative  . No narrative on file     Filed Vitals:   12/03/12 1414  Height: 5\' 2"  (1.575 m)  Weight: 135 lb (61.236  kg)    PHYSICAL EXAM General: NAD Neck: No JVD, no thyromegaly or thyroid nodule.  Lungs: Clear to auscultation bilaterally with normal respiratory effort. CV: Nondisplaced PMI.  Heart regular S1/S2, no S3/S4, no murmur.  No peripheral edema.  Bilateral carotid bruits.  Normal pedal pulses.  Abdomen: Soft, nontender, no hepatosplenomegaly, no distention.  Neurologic: Alert and oriented x 3.  Psych: Normal affect. Extremities: No clubbing or cyanosis.   ECG: reviewed and available in electronic records.   Carotid US (02/2012): IMPRESSION:  Retrograde flow in the left vertebral artery. This finding can be associated with central left subclavian artery stenosis and subclavian steal syndrome.  Mild-moderate plaque in the carotid arteries. The estimated degree of stenosis in the right internal carotid artery is less than 50%.  The estimated degree of stenosis in the left distal internal carotid artery may be 50-69% based on the peak systolic velocity. However, the estimated degree of stenosis in the left internal carotid artery is likely an overestimated because the left common carotid artery velocity is also elevated.  Probable stenosis involving the external carotid arteries.     ASSESSMENT AND PLAN: 1. HTN: denies having a h/o of this, and it is presently normal. 2. Hyperlipidemia: restart Lipitor and check lipids in 3 months along with LFT's. 3. Carotid artery stenosis: mild to moderate bilaterally. 4. Subclavian artery stenosis: no indication for intervention. Follows up with vascular surgery. 5. Tobacco abuse: cessation counseling given. 6. Chest pain: given her hyperlipidemia, tobacco use history, and strong family history, I will proceed with a dobutamine Cardiolite stress test to evaluate for occult ischemic CAD. 7. Syncope: unclear etiology. I may consider an event monitor in the future. Again, there was some suggestion that this may be due to psychogenic causes from  prior office visit. For the time being, I will proceed with a stress test.   Prentice Docker, M.D., F.A.C.C.

## 2012-12-10 ENCOUNTER — Ambulatory Visit: Payer: Medicare Other | Admitting: Gastroenterology

## 2012-12-15 ENCOUNTER — Inpatient Hospital Stay (HOSPITAL_COMMUNITY): Admission: RE | Admit: 2012-12-15 | Payer: Medicare Other | Source: Ambulatory Visit

## 2012-12-15 ENCOUNTER — Encounter (HOSPITAL_COMMUNITY): Payer: Medicare Other

## 2012-12-15 ENCOUNTER — Ambulatory Visit (HOSPITAL_COMMUNITY): Payer: Medicare Other

## 2012-12-21 ENCOUNTER — Other Ambulatory Visit (HOSPITAL_COMMUNITY): Payer: Self-pay | Admitting: Internal Medicine

## 2012-12-21 DIAGNOSIS — R609 Edema, unspecified: Secondary | ICD-10-CM

## 2012-12-21 DIAGNOSIS — S8990XA Unspecified injury of unspecified lower leg, initial encounter: Secondary | ICD-10-CM

## 2012-12-22 ENCOUNTER — Ambulatory Visit: Payer: Medicaid Other | Admitting: Orthopedic Surgery

## 2012-12-22 ENCOUNTER — Ambulatory Visit: Payer: Medicare Other | Admitting: Gastroenterology

## 2012-12-22 ENCOUNTER — Ambulatory Visit (HOSPITAL_COMMUNITY)
Admission: RE | Admit: 2012-12-22 | Discharge: 2012-12-22 | Disposition: A | Discharge status: Home / Self Care | Payer: Medicare Other | Source: Ambulatory Visit | Attending: Internal Medicine | Admitting: Internal Medicine

## 2012-12-22 ENCOUNTER — Telehealth: Payer: Self-pay | Admitting: Gastroenterology

## 2012-12-22 DIAGNOSIS — R609 Edema, unspecified: Secondary | ICD-10-CM

## 2012-12-22 DIAGNOSIS — M79609 Pain in unspecified limb: Secondary | ICD-10-CM | POA: Insufficient documentation

## 2012-12-22 DIAGNOSIS — M7120 Synovial cyst of popliteal space [Baker], unspecified knee: Secondary | ICD-10-CM | POA: Insufficient documentation

## 2012-12-22 DIAGNOSIS — S8990XA Unspecified injury of unspecified lower leg, initial encounter: Secondary | ICD-10-CM

## 2012-12-22 NOTE — Telephone Encounter (Signed)
Patient has no-showed twice this year. Please send letter "do not neglect your health".

## 2012-12-22 NOTE — Telephone Encounter (Signed)
Pt was a no show

## 2012-12-23 ENCOUNTER — Encounter: Payer: Self-pay | Admitting: Gastroenterology

## 2012-12-23 NOTE — Telephone Encounter (Signed)
Mailed letter and PCP is aware °

## 2012-12-28 ENCOUNTER — Other Ambulatory Visit: Payer: Self-pay | Admitting: Vascular Surgery

## 2012-12-28 DIAGNOSIS — G458 Other transient cerebral ischemic attacks and related syndromes: Secondary | ICD-10-CM

## 2013-01-11 ENCOUNTER — Encounter (HOSPITAL_COMMUNITY): Payer: Self-pay

## 2013-01-11 ENCOUNTER — Encounter (HOSPITAL_COMMUNITY)
Admission: RE | Admit: 2013-01-11 | Discharge: 2013-01-11 | Disposition: A | Discharge status: Home / Self Care | Payer: Medicare Other | Source: Ambulatory Visit | Attending: Cardiovascular Disease | Admitting: Cardiovascular Disease

## 2013-01-11 DIAGNOSIS — R079 Chest pain, unspecified: Secondary | ICD-10-CM

## 2013-01-11 DIAGNOSIS — I259 Chronic ischemic heart disease, unspecified: Secondary | ICD-10-CM | POA: Insufficient documentation

## 2013-01-11 DIAGNOSIS — R9431 Abnormal electrocardiogram [ECG] [EKG]: Secondary | ICD-10-CM | POA: Insufficient documentation

## 2013-01-11 DIAGNOSIS — E785 Hyperlipidemia, unspecified: Secondary | ICD-10-CM | POA: Insufficient documentation

## 2013-01-11 DIAGNOSIS — I10 Essential (primary) hypertension: Secondary | ICD-10-CM

## 2013-01-11 MED ORDER — DOBUTAMINE IN D5W 4-5 MG/ML-% IV SOLN
INTRAVENOUS | Status: AC
  Administered 2013-01-11: 10 ug/kg/min via INTRAVENOUS
  Filled 2013-01-11: qty 250

## 2013-01-11 MED ORDER — TECHNETIUM TC 99M SESTAMIBI GENERIC - CARDIOLITE
10.0000 | Freq: Once | INTRAVENOUS | Status: AC | PRN
  Administered 2013-01-11: 10 via INTRAVENOUS

## 2013-01-11 MED ORDER — ATROPINE SULFATE 1 MG/ML IJ SOLN
INTRAMUSCULAR | Status: AC
  Administered 2013-01-11: 1 mg via INTRAVENOUS
  Filled 2013-01-11: qty 1

## 2013-01-11 MED ORDER — TECHNETIUM TC 99M SESTAMIBI - CARDIOLITE
30.0000 | Freq: Once | INTRAVENOUS | Status: AC | PRN
  Administered 2013-01-11: 12:00:00 30 via INTRAVENOUS

## 2013-01-11 NOTE — Progress Notes (Signed)
Stress Lab Nurses Notes - Jeani Hawking  HIRAL LUKASIEWICZ 01/11/2013 Reason for doing test: CAD and Chest Pain Type of test: Dobutamine Cardiolite Nurse performing test: Parke Poisson, RN Nuclear Medicine Tech: Lyndel Pleasure Echo Tech: Not Applicable MD performing test: S. McDowell/K.Lawrence NP Family MD: Sherwood Gambler Test explained and consent signed: yes IV started: 22g jelco, Saline lock flushed, No redness or edema and Saline lock started in radiology Symptoms: None Treatment/Intervention: Atropine 1 mg IV Reason test stopped: reached target HR After recovery IV was: Discontinued via X-ray tech and No redness or edema Patient to return to Nuc. Med at : 12:00 Patient discharged: Home Patient's Condition upon discharge was: stable Comments: During test peak BP 91/51 & HR 139 . Recovery BP 104/74 & HR 100 . Symptoms resolved in recovery. Erskine Speed T

## 2013-01-15 ENCOUNTER — Other Ambulatory Visit (HOSPITAL_COMMUNITY): Payer: Self-pay | Admitting: Internal Medicine

## 2013-01-15 DIAGNOSIS — Z139 Encounter for screening, unspecified: Secondary | ICD-10-CM

## 2013-01-19 ENCOUNTER — Ambulatory Visit (HOSPITAL_COMMUNITY): Payer: Medicare Other

## 2013-01-25 ENCOUNTER — Ambulatory Visit: Payer: Medicare Other | Admitting: Gastroenterology

## 2013-01-25 ENCOUNTER — Telehealth: Payer: Self-pay | Admitting: Gastroenterology

## 2013-01-25 NOTE — Telephone Encounter (Signed)
Please send letter for follow-up.  

## 2013-01-25 NOTE — Telephone Encounter (Signed)
Pt was a no show

## 2013-01-26 ENCOUNTER — Encounter: Payer: Self-pay | Admitting: Gastroenterology

## 2013-01-26 NOTE — Telephone Encounter (Signed)
Mailed letter °

## 2013-02-01 ENCOUNTER — Telehealth: Payer: Self-pay | Admitting: *Deleted

## 2013-02-01 MED ORDER — ASPIRIN EC 81 MG PO TBEC: 81.0000 mg | DELAYED_RELEASE_TABLET | Freq: Every day | ORAL | Status: DC

## 2013-02-01 NOTE — Telephone Encounter (Signed)
Message copied by Lesle Chris on Mon Feb 01, 2013  1:51 PM ------      Message from: Prentice Docker A      Created: Tue Jan 12, 2013  9:24 AM       Would it be possible to get her in sooner?            ----- Message -----         From: Lesle Chris, LPN         Sent: 01/11/2013   4:53 PM           To: Laqueta Linden, MD            She is scheduled for 03/03/2013.       ------

## 2013-02-01 NOTE — Telephone Encounter (Signed)
Notes Recorded by Lesle Chris, LPN on 16/11/9602 at 1:40 PM Patient notified and verbalized understanding. States that ASA upsets her stomach. Advised to try the ASA 81mg  daily "coated". OV moved up from 03/03/13 to 02/23/2013. ------ Notes Recorded by Lesle Chris, LPN on 54/10/8117 at 9:11 AM Left message to return call. ------ Notes Recorded by Lesle Chris, LPN on 14/78/2956 at 11:47 AM Left message to return call. ------ Notes Recorded by Lesle Chris, LPN on 21/04/863 at 4:53 PM She is scheduled for 03/03/2013. ----- Notes Recorded by Laqueta Linden, MD on 01/11/2013 at 4:13 PM I will need to initiate cardioprotective meds. When is she scheduled for a f/u visit? I can discuss stress test results with her at that time. I would recommend she start ASA 81 mg daily to begin with.

## 2013-02-23 ENCOUNTER — Encounter: Payer: Self-pay | Admitting: Cardiovascular Disease

## 2013-02-23 ENCOUNTER — Ambulatory Visit (INDEPENDENT_AMBULATORY_CARE_PROVIDER_SITE_OTHER): Payer: Medicare Other | Admitting: Cardiovascular Disease

## 2013-02-23 VITALS — BP 118/79 | HR 88 | Ht 62.5 in | Wt 127.0 lb

## 2013-02-23 DIAGNOSIS — Z7189 Other specified counseling: Secondary | ICD-10-CM

## 2013-02-23 DIAGNOSIS — R079 Chest pain, unspecified: Secondary | ICD-10-CM

## 2013-02-23 DIAGNOSIS — E785 Hyperlipidemia, unspecified: Secondary | ICD-10-CM

## 2013-02-23 DIAGNOSIS — I708 Atherosclerosis of other arteries: Secondary | ICD-10-CM

## 2013-02-23 DIAGNOSIS — F17200 Nicotine dependence, unspecified, uncomplicated: Secondary | ICD-10-CM

## 2013-02-23 DIAGNOSIS — R55 Syncope and collapse: Secondary | ICD-10-CM

## 2013-02-23 DIAGNOSIS — I6529 Occlusion and stenosis of unspecified carotid artery: Secondary | ICD-10-CM

## 2013-02-23 DIAGNOSIS — I771 Stricture of artery: Secondary | ICD-10-CM

## 2013-02-23 DIAGNOSIS — I2510 Atherosclerotic heart disease of native coronary artery without angina pectoris: Secondary | ICD-10-CM

## 2013-02-23 DIAGNOSIS — Z716 Tobacco abuse counseling: Secondary | ICD-10-CM

## 2013-02-23 DIAGNOSIS — I10 Essential (primary) hypertension: Secondary | ICD-10-CM

## 2013-02-23 MED ORDER — ISOSORBIDE MONONITRATE ER 30 MG PO TB24: 30.0000 mg | ORAL_TABLET | Freq: Every day | ORAL | Status: DC

## 2013-02-23 NOTE — Progress Notes (Signed)
Patient ID: [name], female   DOB: [birth year], 58 y.o.   MRN: [id number]      SUBJECTIVE: [name] has a h/o bilateral carotid artery stenosis, COPD, sleep apnea, left subclavian artery stenosis, HTN (although she denies this), hyperlipidemia (not on a statin), tobacco abuse, and syncope in the past. She underwent a dobutamine Cardiolite stress test in December 2014 which revealed anteroseptal infarct with a mild degree of peri-infarct ischemia. For this reason I started aspirin 81 mg daily. LVEF 50%. ECG today shows normal sinus rhythm at a rate of 80 beats per minute with some mild initial slurring of the QRS complex the QRS width of 96 ms, PR 126 ms. There is a possible old anteroseptal infarct. She has continued to experience some chest pain. She smoking one half pack per day cigarettes and is trying to quit. She may have had another near syncopal episode but the details are not completely clear.      Allergies  Allergen Reactions  . Acetaminophen-Codeine Nausea And Vomiting  . Adhesive [Tape]   . Aspirin Nausea And Vomiting  . Celecoxib   . Levaquin [Levofloxacin In D5w] Itching  . Sulfa Antibiotics   . Latex Rash    Current Outpatient Prescriptions  Medication Sig Dispense Refill  . albuterol (PROVENTIL) 2 MG tablet Take 2 mg by mouth daily as needed. For shortness of breath      . ALPRAZolam (XANAX) 1 MG tablet Take 1 mg by mouth 4 (four) times daily as needed.       Marland Kitchen aspirin EC 81 MG tablet Take 1 tablet (81 mg total) by mouth daily.      Marland Kitchen atorvastatin (LIPITOR) 20 MG tablet Take 1 tablet (20 mg total) by mouth every evening.  30 tablet  6  . busPIRone (BUSPAR) 15 MG tablet Take 15 mg by mouth 2 (two) times daily.      . DULoxetine (CYMBALTA) 60 MG capsule Take 120 mg by mouth daily.       Marland Kitchen levothyroxine (LEVOTHROID) 25 MCG tablet Take 25 mcg by mouth every morning.       Marland Kitchen NEXIUM 40 MG capsule Take 40 mg by mouth every morning.       Marland Kitchen oxycodone (OXYCONTIN) 30  MG TB12 Take 30-60 mg by mouth every 4 (four) hours as needed.        No current facility-administered medications for this visit.    Past Medical History  Diagnosis Date  . Near syncope   . Anxiety and depression   . Hyperlipidemia   . Hypertension   . Hypothyroid   . Tobacco abuse     1/2 pack per day  . Chronic pain 11/22/2010  . Migraine headache 11/22/2010  . Gastroesophageal reflux disease 11/22/2010  . Pneumonia   . Bronchitis     Past Surgical History  Procedure Laterality Date  . Cholecystectomy  2007  . Abdominal hysterectomy      without oophorectomy for neoplastic disease  . Nasal sinus surgery    . Dilation and curettage of uterus    . Lumbar laminectomy  x3    Left; complicated by neurologic dysfunction  . Laparotomy    . Carpal tunnel release      Left    History   Social History  . Marital Status: Married    Spouse Name: N/A    Number of Children: 2  . Years of Education: N/A   Occupational History  .  Disabled   Social History Main Topics  . Smoking status: Current Every Day Smoker -- 0.50 packs/day for 40 years    Types: Cigarettes  . Smokeless tobacco: Never Used  . Alcohol Use: No  . Drug Use: No  . Sexual Activity: Not Currently   Other Topics Concern  . Not on file   Social History Narrative  . No narrative on file     BP 118/79 Pulse 88   PHYSICAL EXAM General: NAD Neck: No JVD, no thyromegaly or thyroid nodule.  Lungs: Clear to auscultation bilaterally with normal respiratory effort. CV: Nondisplaced PMI.  Heart regular S1/S2, no S3/S4, no murmur.  No peripheral edema.  No carotid bruit.  Normal pedal pulses.  Abdomen: Soft, nontender, no hepatosplenomegaly, no distention.  Neurologic: Alert and oriented x 3.  Psych: Normal affect. Extremities: No clubbing or cyanosis.   ECG: reviewed and available in electronic records.  Stress test (01/2013): Tomographic views were obtained using the short axis, vertical  long axis, and horizontal long axis planes. There is a moderate-sized, moderate intensity, anteroseptal defect from apex to base that exhibits reversibility at the base, otherwise fixed. This is suggestive of scar with mild peri-infarct ischemia.  Gated imaging reveals an EDV of 100, ESV of 50, TID ratio 0.94, and LVEF of 50%. Possible inferior septal hypokinesis noted. This does not correspond well with the perfusion defect however.  IMPRESSION: Abnormal, low to intermediate risk dobutamine Cardiolite. ECG portion of the study was abnormal, although with reduced specificity due to development of IVCD. Chest pain was not reported. Baseline tracing suggest possible old anteroseptal infarct. Perfusion imaging demonstrates probable scar with peri-infarct ischemia involving the anteroseptal wall, LVEF is 50% with normal volumes.     ASSESSMENT AND PLAN: 1. HTN: denies having a h/o of this, and it is presently normal.  2. Hyperlipidemia: continue Lipitor and check lipids in near future along with LFT's.  3. Carotid artery stenosis: mild to moderate bilaterally. Due for f/u Dopplers. 4. Subclavian artery stenosis: no indication for intervention. Follows up with vascular surgery.  5. Tobacco abuse: cessation counseling given.  6. Chest pain: as stated previously, her dobutamine Cardiolite stress test revealed anteroseptal infarct with a mild degree of peri-infarct ischemia, indicative of probable ischemic CAD. LVEF 50%. Already on statin therapy. Will not prescribe beta blockers due to significant COPD. Will start Imdur 30 mg daily and closely monitor. 7. Syncope: unclear etiology. ECG showed mild slurring of QRS complex. I will monitor and repeat ECG at next visit. I may consider an event monitor in the future. Again, there was some suggestion that this may be due to psychogenic causes from a prior office visit.  Dispo: f/u 4-6 weeks.  Kate Sable, M.D., F.A.C.C.

## 2013-02-23 NOTE — Patient Instructions (Addendum)
Your physician recommends that you schedule a follow-up appointment in: 1 month. Your physician has recommended you make the following change in your medication: Start isosorbide mononitrate 30 mg daily. Your new prescription has been sent to your pharmacy. All other medications will remain the same.

## 2013-02-23 NOTE — Addendum Note (Signed)
Addended by: Merlene Laughter on: 02/23/2013 03:30 PM   Modules accepted: Orders

## 2013-03-03 ENCOUNTER — Ambulatory Visit: Payer: Medicare Other | Admitting: Cardiovascular Disease

## 2013-03-11 ENCOUNTER — Encounter: Payer: Self-pay | Admitting: Vascular Surgery

## 2013-03-12 ENCOUNTER — Ambulatory Visit: Payer: Medicare Other | Admitting: Vascular Surgery

## 2013-03-12 ENCOUNTER — Other Ambulatory Visit (HOSPITAL_COMMUNITY): Payer: Medicare Other

## 2013-03-29 ENCOUNTER — Emergency Department (HOSPITAL_COMMUNITY)
Admission: EM | Admit: 2013-03-29 | Discharge: 2013-03-29 | Disposition: A | Discharge status: Home / Self Care | Payer: Medicare Other | Attending: Emergency Medicine | Admitting: Emergency Medicine

## 2013-03-29 ENCOUNTER — Encounter (HOSPITAL_COMMUNITY): Payer: Self-pay | Admitting: Emergency Medicine

## 2013-03-29 DIAGNOSIS — Z79899 Other long term (current) drug therapy: Secondary | ICD-10-CM | POA: Insufficient documentation

## 2013-03-29 DIAGNOSIS — E785 Hyperlipidemia, unspecified: Secondary | ICD-10-CM | POA: Insufficient documentation

## 2013-03-29 DIAGNOSIS — G8929 Other chronic pain: Secondary | ICD-10-CM | POA: Insufficient documentation

## 2013-03-29 DIAGNOSIS — T368X5A Adverse effect of other systemic antibiotics, initial encounter: Secondary | ICD-10-CM | POA: Insufficient documentation

## 2013-03-29 DIAGNOSIS — F3289 Other specified depressive episodes: Secondary | ICD-10-CM | POA: Insufficient documentation

## 2013-03-29 DIAGNOSIS — Z8709 Personal history of other diseases of the respiratory system: Secondary | ICD-10-CM | POA: Insufficient documentation

## 2013-03-29 DIAGNOSIS — Z8701 Personal history of pneumonia (recurrent): Secondary | ICD-10-CM | POA: Insufficient documentation

## 2013-03-29 DIAGNOSIS — Z91040 Latex allergy status: Secondary | ICD-10-CM | POA: Insufficient documentation

## 2013-03-29 DIAGNOSIS — F17200 Nicotine dependence, unspecified, uncomplicated: Secondary | ICD-10-CM | POA: Insufficient documentation

## 2013-03-29 DIAGNOSIS — K219 Gastro-esophageal reflux disease without esophagitis: Secondary | ICD-10-CM | POA: Insufficient documentation

## 2013-03-29 DIAGNOSIS — Z7982 Long term (current) use of aspirin: Secondary | ICD-10-CM | POA: Insufficient documentation

## 2013-03-29 DIAGNOSIS — R209 Unspecified disturbances of skin sensation: Secondary | ICD-10-CM | POA: Insufficient documentation

## 2013-03-29 DIAGNOSIS — Z9089 Acquired absence of other organs: Secondary | ICD-10-CM | POA: Insufficient documentation

## 2013-03-29 DIAGNOSIS — L259 Unspecified contact dermatitis, unspecified cause: Secondary | ICD-10-CM

## 2013-03-29 DIAGNOSIS — F411 Generalized anxiety disorder: Secondary | ICD-10-CM | POA: Insufficient documentation

## 2013-03-29 DIAGNOSIS — F329 Major depressive disorder, single episode, unspecified: Secondary | ICD-10-CM | POA: Insufficient documentation

## 2013-03-29 DIAGNOSIS — E039 Hypothyroidism, unspecified: Secondary | ICD-10-CM | POA: Insufficient documentation

## 2013-03-29 DIAGNOSIS — I10 Essential (primary) hypertension: Secondary | ICD-10-CM | POA: Insufficient documentation

## 2013-03-29 MED ORDER — HYDROXYZINE HCL 25 MG PO TABS: 25.0000 mg | ORAL_TABLET | Freq: Four times a day (QID) | ORAL | Status: DC | PRN

## 2013-03-29 MED ORDER — HYDROXYZINE HCL 25 MG PO TABS
50.0000 mg | ORAL_TABLET | Freq: Once | ORAL | Status: AC
  Administered 2013-03-29: 50 mg via ORAL
  Filled 2013-03-29: qty 2

## 2013-03-29 MED ORDER — PREDNISONE 10 MG PO TABS: ORAL_TABLET | ORAL | Status: DC

## 2013-03-29 MED ORDER — DEXAMETHASONE SODIUM PHOSPHATE 10 MG/ML IJ SOLN
10.0000 mg | Freq: Once | INTRAMUSCULAR | Status: AC
  Administered 2013-03-29: 10 mg via INTRAMUSCULAR
  Filled 2013-03-29: qty 1

## 2013-03-29 NOTE — ED Provider Notes (Signed)
CSN: [id number]     Arrival date & time 03/29/13  1617 History   First MD Initiated Contact with Patient 03/29/13 1723 This chart was scribed for non-physician practitioner Evalee Jefferson, PA-C working with Janice Norrie, MD by Anastasia Pall, ED scribe. This patient was seen in room APFT22/APFT22 and the patient's care was started at 5:44 PM.     Chief Complaint  Patient presents with  . Facial Swelling   (Consider location/radiation/quality/duration/timing/severity/associated sxs/prior Treatment) The history is provided by the patient. No language interpreter was used.   HPI Comments: [name] is a 58 y.o. female who presents to the Emergency Department complaining of itchy, painful, facial swelling, onset last night. She was over at a friends house for dinner. She states the friend applied Neosporin triple antibiotic ointment over her nose and cheeks for pt's chronic nose wound which was caused by using breath right strips, and pt broke out with rash and swelling soon after. She reports the blisters around her nose will break, discharge, and return similar to poison oak. She reports bilateral itchiness around her eyes, with associated tingling. She reports trouble sleeping due to her symptoms. She reports taking 8 Benadryl today, without relief. She denies any other symptoms.   PCP - Glo Herring., MD  Past Medical History  Diagnosis Date  . Near syncope   . Anxiety and depression   . Hyperlipidemia   . Hypertension   . Hypothyroid   . Tobacco abuse     1/2 pack per day  . Chronic pain 11/22/2010  . Migraine headache 11/22/2010  . Gastroesophageal reflux disease 11/22/2010  . Pneumonia   . Bronchitis    Past Surgical History  Procedure Laterality Date  . Cholecystectomy  2007  . Abdominal hysterectomy      without oophorectomy for neoplastic disease  . Nasal sinus surgery    . Dilation and curettage of uterus    . Lumbar laminectomy  x3    Left; complicated by neurologic  dysfunction  . Laparotomy    . Carpal tunnel release      Left   Family History  Problem Relation Age of Onset  . Colon polyps Mother    History  Substance Use Topics  . Smoking status: Current Every Day Smoker -- 0.50 packs/day for 40 years    Types: Cigarettes  . Smokeless tobacco: Never Used  . Alcohol Use: No   OB History   Grav Para Term Preterm Abortions TAB SAB Ect Mult Living                 Review of Systems  Constitutional: Negative for fever.  HENT: Positive for facial swelling (itchy, painful).   Eyes: Positive for redness and itching. Negative for pain and visual disturbance.  Respiratory: Negative for chest tightness, shortness of breath and wheezing.     Allergies  Acetaminophen-codeine; Adhesive; Aspirin; Celecoxib; Levaquin; Sulfa antibiotics; and Latex  Home Medications   Current Outpatient Rx  Name  Route  Sig  Dispense  Refill  . albuterol (PROVENTIL) 2 MG tablet   Oral   Take 2 mg by mouth daily as needed. For shortness of breath         . ALPRAZolam (XANAX) 1 MG tablet   Oral   Take 1 mg by mouth 4 (four) times daily as needed.          Marland Kitchen aspirin EC 81 MG tablet   Oral   Take 1 tablet (81 mg total)  by mouth daily.         Marland Kitchen atorvastatin (LIPITOR) 20 MG tablet   Oral   Take 1 tablet (20 mg total) by mouth every evening.   30 tablet   6   . busPIRone (BUSPAR) 15 MG tablet   Oral   Take 15 mg by mouth 2 (two) times daily.         . DULoxetine (CYMBALTA) 60 MG capsule   Oral   Take 120 mg by mouth daily.          . isosorbide mononitrate (IMDUR) 30 MG 24 hr tablet   Oral   Take 1 tablet (30 mg total) by mouth daily.   90 tablet   3     New   . levothyroxine (LEVOTHROID) 25 MCG tablet   Oral   Take 25 mcg by mouth every morning.          Marland Kitchen NEXIUM 40 MG capsule   Oral   Take 40 mg by mouth every morning.          Marland Kitchen oxycodone (OXYCONTIN) 30 MG TB12   Oral   Take 30-60 mg by mouth every 4 (four) hours as needed  (pain).          . hydrOXYzine (ATARAX/VISTARIL) 25 MG tablet   Oral   Take 1-2 tablets (25-50 mg total) by mouth every 6 (six) hours as needed for itching.   20 tablet   0   . predniSONE (DELTASONE) 10 MG tablet      6, 5, 4, 3, 2 then 1 tablet by mouth daily for 6 days total.   21 tablet   0    BP 117/67  Pulse 72  Temp(Src) 98.1 F (36.7 C) (Oral)  Resp 18  Ht 5\' 2"  (1.575 m)  Wt 125 lb (56.7 kg)  BMI 22.86 kg/m2  SpO2 95%  Physical Exam  Nursing note and vitals reviewed. Constitutional: She is oriented to person, place, and time. She appears well-developed and well-nourished. No distress.  HENT:  Head: Normocephalic and atraumatic.  Bilateral cheeks including infraorbital areas. Edematous, boggy appearance to infraorbital skin, several areas of excoriation, erythematous rash with several areas of vesicles. Few of them are weeping clear  fluid. Old appearing healed ulceration right lateral nose.   Eyes: EOM are normal.  Neck: Neck supple.  Cardiovascular: Normal rate.   Pulmonary/Chest: Effort normal. No respiratory distress.  Musculoskeletal: Normal range of motion.  Neurological: She is alert and oriented to person, place, and time.  Skin: Skin is warm and dry.  Psychiatric: Her behavior is normal. Her mood appears anxious.   ED Course  Procedures (including critical care time)  DIAGNOSTIC STUDIES: Oxygen Saturation is 95% on room air, normal by my interpretation.    COORDINATION OF CARE: 5:50 PM-Discussed treatment plan which includes Vistaril and Decadron with pt at bedside and pt agreed to plan. Advised pt symptoms may take 1 week to subside, and to follow up with Dr. Gerarda Fraction.   Labs Review Labs Reviewed - No data to display Imaging Review No results found.  EKG Interpretation   None      Medications  hydrOXYzine (ATARAX/VISTARIL) tablet 50 mg (50 mg Oral Given 03/29/13 1822)  dexamethasone (DECADRON) injection 10 mg (10 mg Intramuscular Given  03/29/13 1822)    MDM   Final diagnoses:  Contact dermatitis    Pt with contact allergy to neosporin. She was given atarax in place of benadryl which did not seem  to help her sx at home.  Decadron IM here, prednisone course.  Cool compresses to face, avoid scratching, no signs of infected skin. Advised close f/u with recheck by pcp in 2 days.  Pt agrees with plan.  I personally performed the services described in this documentation, which was scribed in my presence. The recorded information has been reviewed and is accurate.   Evalee Jefferson, PA-C 03/30/13 1430

## 2013-03-29 NOTE — ED Notes (Signed)
Reports has had "Spot" on nose x 2 yrs - states put abx cream on last night to try to get it to heal and woke up with morning with itching and facial swelling to nose and under bil eyes.  Reports drainage from eyes.  Also reports blisters to face.  Redness and swelling noted to face/eyes with blisters.  No drainage noted.  Wounds noted to bridge of nose.  Denies difficulty breathing.  Reports headaches and itching.  Taking OTC benadryl with no relief.

## 2013-03-29 NOTE — Discharge Instructions (Signed)
Contact Dermatitis Contact dermatitis is a rash that happens when something touches the skin. You touched something that irritates your skin, or you have allergies to something you touched. HOME CARE   Avoid the thing that caused your rash.  Keep your rash away from hot water, soap, sunlight, chemicals, and other things that might bother it.  Do not scratch your rash.  You can take cool baths to help stop itching.  Only take medicine as told by your doctor.  Keep all doctor visits as told. GET HELP RIGHT AWAY IF:   Your rash is not better after 3 days.  Your rash gets worse.  Your rash is puffy (swollen), tender, red, sore, or warm.  You have problems with your medicine. MAKE SURE YOU:   Understand these instructions.  Will watch your condition.  Will get help right away if you are not doing well or get worse. Document Released: 11/18/2008 Document Revised: 04/15/2011 Document Reviewed: 06/26/2010 [facility] Patient Information 2014 [place], [place].   Take your first dose of prednisone tomorrow as the shot you received tonight will cover you for tonight.  Continue using cool compresses which will help with itching and swelling.  You are allergic to neosporin and should not use this antibiotic.  Call your doctor for a recheck of your rash in 2 days.

## 2013-03-29 NOTE — ED Notes (Signed)
Pt c/o swelling under eyes with redness and blisters x 2 days.

## 2013-04-02 NOTE — ED Provider Notes (Signed)
Medical screening examination/treatment/procedure(s) were performed by non-physician practitioner and as supervising physician I was immediately available for consultation/collaboration.  EKG Interpretation  None  Rolland Porter, MD, Abram Sander   Janice Norrie, MD 04/02/13 1314

## 2013-04-05 ENCOUNTER — Ambulatory Visit: Payer: Medicare Other | Admitting: Cardiovascular Disease

## 2013-04-05 ENCOUNTER — Encounter: Payer: Self-pay | Admitting: Cardiovascular Disease

## 2013-04-22 ENCOUNTER — Encounter: Payer: Self-pay | Admitting: Cardiovascular Disease

## 2013-04-22 ENCOUNTER — Ambulatory Visit (INDEPENDENT_AMBULATORY_CARE_PROVIDER_SITE_OTHER): Payer: Medicare Other | Admitting: Cardiovascular Disease

## 2013-04-22 VITALS — BP 93/63 | HR 96 | Ht 62.5 in | Wt 131.0 lb

## 2013-04-22 DIAGNOSIS — I10 Essential (primary) hypertension: Secondary | ICD-10-CM

## 2013-04-22 DIAGNOSIS — I771 Stricture of artery: Secondary | ICD-10-CM

## 2013-04-22 DIAGNOSIS — I708 Atherosclerosis of other arteries: Secondary | ICD-10-CM

## 2013-04-22 DIAGNOSIS — R079 Chest pain, unspecified: Secondary | ICD-10-CM

## 2013-04-22 DIAGNOSIS — I2510 Atherosclerotic heart disease of native coronary artery without angina pectoris: Secondary | ICD-10-CM

## 2013-04-22 DIAGNOSIS — E785 Hyperlipidemia, unspecified: Secondary | ICD-10-CM

## 2013-04-22 DIAGNOSIS — I6529 Occlusion and stenosis of unspecified carotid artery: Secondary | ICD-10-CM

## 2013-04-22 MED ORDER — ISOSORBIDE MONONITRATE ER 60 MG PO TB24: 60.0000 mg | ORAL_TABLET | Freq: Every day | ORAL | Status: DC

## 2013-04-22 NOTE — Patient Instructions (Signed)
   Stop Lipitor  Increase Indur to 60mg  daily - new sent to pharm Continue all other medications.   Follow up in 3-4 weeks
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
## 2013-04-29 ENCOUNTER — Encounter: Payer: Self-pay | Admitting: Vascular Surgery

## 2013-04-30 ENCOUNTER — Ambulatory Visit (HOSPITAL_COMMUNITY)
Admission: RE | Admit: 2013-04-30 | Discharge: 2013-04-30 | Disposition: A | Discharge status: Home / Self Care | Payer: Medicare Other | Source: Ambulatory Visit | Attending: Vascular Surgery | Admitting: Vascular Surgery

## 2013-04-30 ENCOUNTER — Encounter: Payer: Self-pay | Admitting: Vascular Surgery

## 2013-04-30 ENCOUNTER — Ambulatory Visit (INDEPENDENT_AMBULATORY_CARE_PROVIDER_SITE_OTHER): Payer: Medicare Other | Admitting: Vascular Surgery

## 2013-04-30 VITALS — BP 116/65 | HR 86 | Ht 62.5 in | Wt 127.9 lb

## 2013-04-30 DIAGNOSIS — I658 Occlusion and stenosis of other precerebral arteries: Secondary | ICD-10-CM | POA: Insufficient documentation

## 2013-04-30 DIAGNOSIS — G458 Other transient cerebral ischemic attacks and related syndromes: Secondary | ICD-10-CM

## 2013-04-30 DIAGNOSIS — I6529 Occlusion and stenosis of unspecified carotid artery: Secondary | ICD-10-CM

## 2013-04-30 NOTE — Progress Notes (Signed)
    Established Carotid Patient  History of Present Illness  [name] is a 58 y.o. ([birth year]) female h/o multiple spinal procedures who presents with chief complaint: left leg radicular pain.  Previous carotid studies demonstrated: RICA <78% stenosis, LICA <29% stenosis, and known L SCA stenosis.  Patient has no history of TIA or stroke symptom.  The patient has never had amaurosis fugax or monocular blindness.  The patient has never had facial drooping or hemiplegia.  The patient has never had receptive or expressive aphasia.  The patient has some numbness and weakness in the left hand but nothing that prevents her from completing her ADL.  The patient has had multiple psychosocial stressors that have aggravated her generalized anxiety disorder.  The patient's PMH, PSH, SH, FamHx, Med, and Allergies are unchanged from 03/06/12.  On ROS today: increased tremor due to GAD, no stroke or TIA sx  Physical Examination  Filed Vitals:   04/30/13 1524 04/30/13 1526  BP: 126/62 116/65  Pulse: 86   Height: 5' 2.5" (1.588 m)   Weight: 127 lb 14.4 oz (58.015 kg)   SpO2: 98%    Body mass index is 23.01 kg/(m^2).  General: A&O x 3, WDWN  Eyes: PERRLA, EOMI  Neck: Supple, no nuchal rigidity, no palpable LAD  Pulmonary: Sym exp, good air movt, CTAB, no rales, rhonchi, & wheezing  Cardiac: RRR, Nl S1, S2, no Murmurs, rubs or gallops  Vascular: Vessel Right Left  Radial Palpable Not Palpable  Brachial Palpable Not Palpable  Carotid Palpable, without bruit Palpable, without bruit  Aorta Not palpable N/A  Femoral Palpable Palpable  Popliteal Not palpable Not palpable  PT Not Palpable Not Palpable  DP Faintly Palpable Faintly Palpable   Gastrointestinal: soft, NTND, -G/R, - HSM, - masses, - CVAT B  Musculoskeletal: M/S 5/5 throughout including L hand, Extremities without ischemic changes , Cap. Refill B hand < 2 sec  Neurologic: CN 2-12 intact , Pain and light touch intact in  extremities including left hand, increased R>L arm tremor , Motor exam as listed above  Non-Invasive Vascular Imaging  CAROTID DUPLEX (Date: 04/30/2013 ):   R ICA stenosis: <40%  R VA: patent and antegrade  L ICA stenosis: <40%  L VA: patent and retrograde  Medical Decision Making  [name] is a 58 y.o. female who presents with: asx BICA stenosis <40%., L SCA stenosis with minimal sx   Based on the patient's vascular studies and examination, I have offered the patient: CTA neck to better evaluate the L SCA stenosis.  At this point, the patient does not think her left arm sx are significant enough to further evaluate the stenosis, so she declines the CTA..  I discussed in depth with the patient the nature of atherosclerosis, and emphasized the importance of maximal medical management including strict control of blood pressure, blood glucose, and lipid levels, antiplatelet agents, obtaining regular exercise, and cessation of smoking.    The patient is aware that without maximal medical management the underlying atherosclerotic disease process will progress, limiting the benefit of any interventions. The patient is currently not on a statin due allergy. The patient is currently not on an anti-platelet due to allergy.  Thank you for allowing [place] to participate in this patient's care.  Adele Barthel, MD [facility] Office: [phone] Pager: [phone]  04/30/2013, 3:58 PM

## 2013-05-03 ENCOUNTER — Other Ambulatory Visit: Payer: Self-pay | Admitting: Dermatology

## 2013-05-03 NOTE — Addendum Note (Signed)
Addended by: Mena Goes on: 05/03/2013 05:35 PM   Modules accepted: Orders

## 2013-05-20 ENCOUNTER — Ambulatory Visit: Payer: Medicare Other | Admitting: Cardiovascular Disease

## 2013-05-24 ENCOUNTER — Ambulatory Visit: Payer: Medicare Other | Admitting: Cardiovascular Disease

## 2013-05-25 ENCOUNTER — Ambulatory Visit (INDEPENDENT_AMBULATORY_CARE_PROVIDER_SITE_OTHER): Payer: Medicare Other | Admitting: Cardiovascular Disease

## 2013-05-25 ENCOUNTER — Encounter: Payer: Self-pay | Admitting: Cardiovascular Disease

## 2013-05-25 VITALS — BP 96/64 | HR 88 | Ht 62.5 in | Wt 134.5 lb

## 2013-05-25 DIAGNOSIS — F17200 Nicotine dependence, unspecified, uncomplicated: Secondary | ICD-10-CM

## 2013-05-25 DIAGNOSIS — R079 Chest pain, unspecified: Secondary | ICD-10-CM

## 2013-05-25 DIAGNOSIS — Z716 Tobacco abuse counseling: Secondary | ICD-10-CM

## 2013-05-25 DIAGNOSIS — I2510 Atherosclerotic heart disease of native coronary artery without angina pectoris: Secondary | ICD-10-CM

## 2013-05-25 DIAGNOSIS — I708 Atherosclerosis of other arteries: Secondary | ICD-10-CM

## 2013-05-25 DIAGNOSIS — I10 Essential (primary) hypertension: Secondary | ICD-10-CM

## 2013-05-25 DIAGNOSIS — E785 Hyperlipidemia, unspecified: Secondary | ICD-10-CM

## 2013-05-25 DIAGNOSIS — I6529 Occlusion and stenosis of unspecified carotid artery: Secondary | ICD-10-CM

## 2013-05-25 DIAGNOSIS — Z7189 Other specified counseling: Secondary | ICD-10-CM

## 2013-05-25 DIAGNOSIS — G8929 Other chronic pain: Secondary | ICD-10-CM

## 2013-05-25 DIAGNOSIS — F411 Generalized anxiety disorder: Secondary | ICD-10-CM

## 2013-05-25 DIAGNOSIS — I771 Stricture of artery: Secondary | ICD-10-CM

## 2013-05-25 DIAGNOSIS — R55 Syncope and collapse: Secondary | ICD-10-CM

## 2013-05-25 MED ORDER — OXYCODONE HCL 30 MG PO TABS: 30.0000 mg | ORAL_TABLET | Freq: Two times a day (BID) | ORAL | Status: DC | PRN

## 2013-05-25 MED ORDER — ISOSORBIDE MONONITRATE ER 30 MG PO TB24: 30.0000 mg | ORAL_TABLET | Freq: Every day | ORAL | Status: DC

## 2013-05-25 NOTE — Progress Notes (Signed)
Patient ID: [name], female   DOB: 1955-02-09, 58 y.o.   MRN: [id number]      SUBJECTIVE: The patient is a 58 year old woman who is here to followup for chest pain in the setting of a previously abnormal nuclear stress test as well as syncope. She also has a h/o bilateral carotid artery stenosis, COPD, sleep apnea, left subclavian artery stenosis, HTN (although she denies this), hyperlipidemia (not on a statin), and tobacco abuse. She underwent a dobutamine Cardiolite stress test in December 2014 which revealed anteroseptal infarct with a mild degree of peri-infarct ischemia, LVEF 50%.   At her last visit, I increased Imdur to 60 mg daily.  She sees Dr. Bridgett Larsson of [facility]. She was offered a CT to better delineate her left subclavian artery stenosis but she declined.  She tells me that she was fired from Dr. Nolon Rod practice for going through the back door.  She is very anxious and tearful, and says she may have a panic attack. She is in extreme pain and has nearly run out of her pain meds (Oxycontin). She says Tramadol has not helped.     Allergies  Allergen Reactions  . Acetaminophen-Codeine Nausea And Vomiting  . Adhesive [Tape]   . Aspirin Nausea And Vomiting  . Celecoxib   . Levaquin [Levofloxacin In D5w] Itching  . Lipitor [Atorvastatin] Other (See Comments)    Myalgias   . Sulfa Antibiotics   . Latex Rash    Current Outpatient Prescriptions  Medication Sig Dispense Refill  . albuterol (PROAIR HFA) 108 (90 BASE) MCG/ACT inhaler Inhale 2 puffs into the lungs every 6 (six) hours as needed for wheezing or shortness of breath.      Marland Kitchen albuterol (PROVENTIL) 2 MG tablet Take 2 mg by mouth 3 (three) times daily. For shortness of breath      . ALPRAZolam (XANAX) 1 MG tablet Take 1 mg by mouth 4 (four) times daily as needed.       Marland Kitchen aspirin EC 81 MG tablet Take 1 tablet (81 mg total) by mouth daily.      . busPIRone (BUSPAR) 15 MG tablet Take 15 mg by mouth 2 (two)  times daily.      . diphenhydrAMINE (BENADRYL) 25 MG tablet Take 25 mg by mouth every 6 (six) hours as needed.      . DULoxetine (CYMBALTA) 60 MG capsule Take 120 mg by mouth daily.       . hydrOXYzine (ATARAX/VISTARIL) 25 MG tablet Take 1-2 tablets (25-50 mg total) by mouth every 6 (six) hours as needed for itching.  20 tablet  0  . isosorbide mononitrate (IMDUR) 60 MG 24 hr tablet Take 1 tablet (60 mg total) by mouth daily.  90 tablet  3  . levothyroxine (LEVOTHROID) 25 MCG tablet Take 25 mcg by mouth every morning.       . methocarbamol (ROBAXIN) 500 MG tablet Take 500 mg by mouth every 8 (eight) hours as needed for muscle spasms.      Marland Kitchen NEXIUM 40 MG capsule Take 40 mg by mouth every morning.       Marland Kitchen oxycodone (OXYCONTIN) 30 MG TB12 Take 30-60 mg by mouth every 4 (four) hours as needed (pain).       Marland Kitchen tiZANidine (ZANAFLEX) 4 MG tablet Take 4 mg by mouth every 8 (eight) hours as needed for muscle spasms.       No current facility-administered medications for this visit.    Past Medical History  Diagnosis Date  . Near syncope   . Anxiety and depression   . Hyperlipidemia   . Hypertension   . Hypothyroid   . Tobacco abuse     1/2 pack per day  . Chronic pain 11/22/2010  . Migraine headache 11/22/2010  . Gastroesophageal reflux disease 11/22/2010  . Pneumonia   . Bronchitis   . CHF (congestive heart failure)   . Anemia   . Myocardial infarction   . Peripheral vascular disease   . Cancer     uterus    Past Surgical History  Procedure Laterality Date  . Cholecystectomy  2007  . Abdominal hysterectomy      without oophorectomy for neoplastic disease  . Nasal sinus surgery    . Dilation and curettage of uterus    . Lumbar laminectomy  x3    Left; complicated by neurologic dysfunction  . Laparotomy    . Carpal tunnel release      Left    History   Social History  . Marital Status: Married    Spouse Name: N/A    Number of Children: 2  . Years of Education: N/A    Occupational History  .      Disabled   Social History Main Topics  . Smoking status: Current Every Day Smoker -- 0.50 packs/day for 40 years    Types: Cigarettes  . Smokeless tobacco: Never Used  . Alcohol Use: No  . Drug Use: No  . Sexual Activity: Not Currently   Other Topics Concern  . Not on file   Social History Narrative  . No narrative on file   BP 96/64  Pulse 88    PHYSICAL EXAM General: NAD, tearful, anxious. Neck: No JVD, no thyromegaly. Lungs: Clear to auscultation bilaterally with normal respiratory effort. CV: Nondisplaced PMI.  Regular rate and rhythm, normal S1/S2, no S3/S4, soft I/VI systolic murmur at LLSB. No pretibial or periankle edema.   Abdomen: Soft, nontender, no hepatosplenomegaly, no distention.  Neurologic: Alert and oriented x 3.  Psych: Tearful. Extremities: No clubbing or cyanosis.   ECG: reviewed and available in electronic records.   Stress test (01/2013):  Tomographic views were obtained using the short axis, vertical long axis, and horizontal long axis planes. There is a moderate-sized, moderate intensity, anteroseptal defect from apex to base that exhibits reversibility at the base, otherwise fixed. This is suggestive of scar with mild peri-infarct ischemia.  Gated imaging reveals an EDV of 100, ESV of 50, TID ratio 0.94, and LVEF of 50%. Possible inferior septal hypokinesis noted. This does not correspond well with the perfusion defect however.  IMPRESSION: Abnormal, low to intermediate risk dobutamine Cardiolite. ECG portion of the study was abnormal, although with reduced specificity due to development of IVCD. Chest pain was not reported. Baseline tracing suggest possible old anteroseptal infarct. Perfusion imaging demonstrates probable scar with peri-infarct ischemia involving the anteroseptal wall, LVEF is 50% with normal volumes.     ASSESSMENT AND PLAN: 1. HTN: Denies having a h/o of this, and it is presently  low normal.  2. Hyperlipidemia: Due to significant myalgias, I previously d/c Lipitor. I may try pravastatin in the future. Will plan to check lipids in near future along with LFT's.  3. Carotid artery stenosis: Mild to moderate bilaterally. Less than 40% b/l on 3/27. Known subclavian steal. 4. Subclavian artery stenosis: Declined CT. Follows up with vascular surgery.  5. Tobacco abuse: now trying to quit.  6. Chest pain: Dobutamine Cardiolite stress test  revealed anteroseptal infarct with a mild degree of peri-infarct ischemia, indicative of probable ischemic CAD. LVEF 50%. Previously d/c statin therapy due to myalgias. Will not prescribe beta blockers due to significant COPD. Increase of Imdur to 60 mg daily did not make much of a difference, and I now feel her symptoms may have been due to anxiety. Will reduce back to 30 mg daily. 7. Syncope: unclear etiology. ECG showed mild slurring of QRS complex. I may consider repeating an ECG at next visit. I may consider an event monitor in the future. Again, there was some suggestion that this may be due to psychogenic causes from a prior office visit, and this idea is only reinforced today.  8. Chronic pain: I have recommended she try asking Dr. Gerarda Fraction to accept her back. Otherwise, she will have to establish with another PCP. I recommended she go back to the pain clinic, and that she seek psychiatric help, possibly at the [facility] in [place], which she has gone to before. I will prescribe (one time only) 30 pills of Oxycontin 30 mg to be used twice daily as needed for pain, with no refills.  Dispo: f/u 6 months.  Kate Sable, M.D., F.A.C.C.

## 2013-05-25 NOTE — Patient Instructions (Signed)
   Decrease Imdur to 30mg  daily - may break 60mg  tablet in half till finish current supply   One time script given for Oxycodone 30mg  to be used twice a day as needed for pain   Printed scripts given today  Continue all other medications.   Contact info given for primary care physician - Dr. Buelah Manis [facility]) Your physician wants you to follow up in: 6 months.  You will receive a reminder letter in the mail one-two months in advance.  If you don't receive a letter, please call our office to schedule the follow up appointment

## 2013-05-31 ENCOUNTER — Telehealth: Payer: Self-pay | Admitting: *Deleted

## 2013-05-31 NOTE — Telephone Encounter (Signed)
That would be fine 

## 2013-05-31 NOTE — Telephone Encounter (Signed)
Dr. Bronson Ing, please advise on note below.

## 2013-05-31 NOTE — Telephone Encounter (Signed)
Patient was given a prescription for chantix by Dr. Gerarda Fraction before her discharge from their practice and wants to know if she can take this medication. Please advise.

## 2013-06-01 NOTE — Telephone Encounter (Signed)
Patient notified

## 2013-06-10 ENCOUNTER — Telehealth: Payer: Self-pay | Admitting: Cardiovascular Disease

## 2013-06-10 NOTE — Telephone Encounter (Signed)
Patient wants to know if it is ok to start using Chantix to help stop smoking. Please call [phone]

## 2013-06-11 NOTE — Telephone Encounter (Signed)
Patient notified okay to take Chantix.  Reminded her that we had discussed this already on 06/01/2013.

## 2013-06-17 ENCOUNTER — Encounter: Payer: Self-pay | Admitting: Internal Medicine

## 2013-06-18 ENCOUNTER — Encounter: Payer: Self-pay | Admitting: Cardiovascular Disease

## 2013-06-30 ENCOUNTER — Ambulatory Visit (INDEPENDENT_AMBULATORY_CARE_PROVIDER_SITE_OTHER): Payer: Medicare Other | Admitting: Cardiovascular Disease

## 2013-06-30 ENCOUNTER — Other Ambulatory Visit: Payer: Self-pay | Admitting: Cardiovascular Disease

## 2013-06-30 ENCOUNTER — Encounter: Payer: Self-pay | Admitting: Cardiovascular Disease

## 2013-06-30 VITALS — BP 91/66 | HR 99 | Ht 62.5 in | Wt 129.0 lb

## 2013-06-30 DIAGNOSIS — I447 Left bundle-branch block, unspecified: Secondary | ICD-10-CM

## 2013-06-30 DIAGNOSIS — I708 Atherosclerosis of other arteries: Secondary | ICD-10-CM

## 2013-06-30 DIAGNOSIS — E785 Hyperlipidemia, unspecified: Secondary | ICD-10-CM

## 2013-06-30 DIAGNOSIS — I6529 Occlusion and stenosis of unspecified carotid artery: Secondary | ICD-10-CM

## 2013-06-30 DIAGNOSIS — G8929 Other chronic pain: Secondary | ICD-10-CM

## 2013-06-30 DIAGNOSIS — R079 Chest pain, unspecified: Secondary | ICD-10-CM

## 2013-06-30 DIAGNOSIS — I771 Stricture of artery: Secondary | ICD-10-CM

## 2013-06-30 DIAGNOSIS — Z716 Tobacco abuse counseling: Secondary | ICD-10-CM

## 2013-06-30 DIAGNOSIS — Z7189 Other specified counseling: Secondary | ICD-10-CM

## 2013-06-30 DIAGNOSIS — I2510 Atherosclerotic heart disease of native coronary artery without angina pectoris: Secondary | ICD-10-CM

## 2013-06-30 DIAGNOSIS — R9439 Abnormal result of other cardiovascular function study: Secondary | ICD-10-CM

## 2013-06-30 DIAGNOSIS — R931 Abnormal findings on diagnostic imaging of heart and coronary circulation: Secondary | ICD-10-CM

## 2013-06-30 DIAGNOSIS — I10 Essential (primary) hypertension: Secondary | ICD-10-CM

## 2013-06-30 DIAGNOSIS — F411 Generalized anxiety disorder: Secondary | ICD-10-CM

## 2013-06-30 DIAGNOSIS — R55 Syncope and collapse: Secondary | ICD-10-CM

## 2013-06-30 DIAGNOSIS — F17200 Nicotine dependence, unspecified, uncomplicated: Secondary | ICD-10-CM

## 2013-06-30 MED ORDER — ALPRAZOLAM 1 MG PO TABS: 1.0000 mg | ORAL_TABLET | Freq: Two times a day (BID) | ORAL | Status: DC | PRN

## 2013-06-30 NOTE — Patient Instructions (Signed)
   One time prescription given for Xanax 1mg  twice a day as needed (#25 with no refills) Continue all other medications.   Please keep upcoming visit for June 9 as discussed with provider today Your physician has requested that you have a cardiac catheterization. Cardiac catheterization is used to diagnose and/or treat various heart conditions. Doctors may recommend this procedure for a number of different reasons. The most common reason is to evaluate chest pain. Chest pain can be a symptom of coronary artery disease (CAD), and cardiac catheterization can show whether plaque is narrowing or blocking your heart's arteries. This procedure is also used to evaluate the valves, as well as measure the blood flow and oxygen levels in different parts of your heart. For further information please visit [email or web address]. Please follow instruction sheet, as given. Please call office after discussing with husband regarding date & time  Follow up will be given after discharge from procedure above

## 2013-06-30 NOTE — Progress Notes (Signed)
Patient ID: [name], female   DOB: 12/01/1955, 58 y.o.   MRN: [id number]      SUBJECTIVE: The patient is a 58-year-old woman who is here to followup for chest pain in the setting of a previously abnormal nuclear stress test as well as syncope.  She also has a h/o bilateral carotid artery stenosis, COPD, sleep apnea, left subclavian artery stenosis, HTN (although she denies this), hyperlipidemia (not on a statin), and tobacco abuse. She underwent a dobutamine Cardiolite stress test in December 2014 which revealed anteroseptal infarct with a mild degree of peri-infarct ischemia, LVEF 50%.   She sees Dr. Chen of [facility]. She was offered a CT to better delineate her left subclavian artery stenosis but she declined.  She told me at her last visit that she was fired from Dr. Fusco's practice for going through the back door.   She was recently evaluated at [facility] for chest pain. Her troponin was normal and her pain appeared atypical. ECG demonstrated a left bundle branch block, QRS duration 132 ms. ECG from January 2015 demonstrated QRS 96 ms. Her d-dimer was 0.45, sodium 137, potassium low at 2.9, bicarbonate 27, and CBC was reportedly normal. Followup potassium was 3.7. BUN was 10 and creatinine 0.85.  She has run out of her Xanax and has been struggling with anxiety and panic attacks. She has had recurrent left precordial chest pain, and is uncertain if it is from her heart or from panic attacks.  She has reduced her cigarette smoking.  She has found a PCP whom she is scheduled to see on 6/9.   Allergies  Allergen Reactions  . Acetaminophen-Codeine Nausea And Vomiting  . Adhesive [Tape]   . Aspirin Nausea And Vomiting  . Celecoxib   . Levaquin [Levofloxacin In D5w] Itching  . Lipitor [Atorvastatin] Other (See Comments)    Myalgias   . Sulfa Antibiotics   . Latex Rash    Current Outpatient Prescriptions  Medication Sig Dispense Refill  . albuterol (PROAIR HFA)  108 (90 BASE) MCG/ACT inhaler Inhale 2 puffs into the lungs every 6 (six) hours as needed for wheezing or shortness of breath.      . albuterol (PROVENTIL) 2 MG tablet Take 2 mg by mouth 3 (three) times daily. For shortness of breath      . ALPRAZolam (XANAX) 1 MG tablet Take 1 mg by mouth 4 (four) times daily as needed.       . aspirin EC 81 MG tablet Take 1 tablet (81 mg total) by mouth daily.      . busPIRone (BUSPAR) 15 MG tablet Take 15 mg by mouth 2 (two) times daily.      . diphenhydrAMINE (BENADRYL) 25 MG tablet Take 25 mg by mouth every 6 (six) hours as needed.      . doxycycline (VIBRA-TABS) 100 MG tablet Take 100 mg by mouth 2 (two) times daily.      . DULoxetine (CYMBALTA) 60 MG capsule Take 120 mg by mouth daily.       . hydrOXYzine (ATARAX/VISTARIL) 25 MG tablet Take 1-2 tablets (25-50 mg total) by mouth every 6 (six) hours as needed for itching.  20 tablet  0  . isosorbide mononitrate (IMDUR) 30 MG 24 hr tablet Take 1 tablet (30 mg total) by mouth daily.  30 tablet  6  . levothyroxine (LEVOTHROID) 25 MCG tablet Take 25 mcg by mouth every morning.       . methocarbamol (ROBAXIN) 500 MG   tablet Take 500 mg by mouth every 8 (eight) hours as needed for muscle spasms.      . NEXIUM 40 MG capsule Take 40 mg by mouth every morning.       . oxycodone (ROXICODONE) 30 MG immediate release tablet Take 1 tablet (30 mg total) by mouth 2 (two) times daily as needed for pain.  30 tablet  0  . tiZANidine (ZANAFLEX) 4 MG tablet Take 4 mg by mouth every 8 (eight) hours as needed for muscle spasms.      . traMADol (ULTRAM) 50 MG tablet Take 50 mg by mouth every 6 (six) hours as needed.       No current facility-administered medications for this visit.    Past Medical History  Diagnosis Date  . Near syncope   . Anxiety and depression   . Hyperlipidemia   . Hypertension   . Hypothyroid   . Tobacco abuse     1/2 pack per day  . Chronic pain 11/22/2010  . Migraine headache 11/22/2010  .  Gastroesophageal reflux disease 11/22/2010  . Pneumonia   . Bronchitis   . CHF (congestive heart failure)   . Anemia   . Myocardial infarction   . Peripheral vascular disease   . Cancer     uterus    Past Surgical History  Procedure Laterality Date  . Cholecystectomy  2007  . Abdominal hysterectomy      without oophorectomy for neoplastic disease  . Nasal sinus surgery    . Dilation and curettage of uterus    . Lumbar laminectomy  x3    Left; complicated by neurologic dysfunction  . Laparotomy    . Carpal tunnel release      Left    History   Social History  . Marital Status: Married    Spouse Name: N/A    Number of Children: 2  . Years of Education: N/A   Occupational History  .      Disabled   Social History Main Topics  . Smoking status: Current Every Day Smoker -- 0.50 packs/day for 40 years    Types: Cigarettes    Start date: 09/06/1972  . Smokeless tobacco: Never Used  . Alcohol Use: No  . Drug Use: No  . Sexual Activity: Not Currently   Other Topics Concern  . Not on file   Social History Narrative  . No narrative on file     Filed Vitals:   06/30/13 1543  BP: 91/66  Pulse: 99  Height: 5' 2.5" (1.588 m)  Weight: 129 lb (58.514 kg)  SpO2: 97%    PHYSICAL EXAM General: NAD, anxious Neck: No JVD, no thyromegaly. Lungs: Clear to auscultation bilaterally with normal respiratory effort. CV: Nondisplaced PMI.  Regular rate and rhythm, normal S1/S2, no S3/S4, no murmur. No pretibial or periankle edema.  Abdomen: Soft, nontender, no hepatosplenomegaly, no distention.  Neurologic: Alert and oriented x 3.  Psych: Anxious. Extremities: No clubbing or cyanosis.   ECG: reviewed and available in electronic records.      ASSESSMENT AND PLAN: 1. HTN: Denies having a h/o of this, and it is presently low normal.  2. Hyperlipidemia: Due to significant myalgias, I previously d/c Lipitor. I may try pravastatin in the future. Will plan to check lipids  in near future along with LFT's.  3. Carotid artery stenosis: Mild to moderate bilaterally. Less than 40% b/l on 3/27. Known subclavian steal.  4. Subclavian artery stenosis: Declined CT. Follows up with vascular   surgery.  5. Tobacco abuse: now trying to quit.  6. Chest pain: Dobutamine Cardiolite stress test revealed anteroseptal infarct with a mild degree of peri-infarct ischemia, indicative of probable ischemic CAD. LVEF 50%. Previously d/c statin therapy due to myalgias. Will not prescribe beta blockers due to significant COPD. Increase of Imdur to 60 mg daily did not make much of a difference, and I reduced it back to 30 mg daily at her last visit.  As she has had recurrent chest pain, now in the setting of a LBBB with a prior abnormal stress test, will proceed with coronary angiography. While some of her symptoms may in fact be related to her anxiety and panic attacks, I do not want to overlook potential ischemic heart disease. 7. Syncope: No further recurrences, unclear etiology. I may consider an event monitor in the future. Again, there was some suggestion that this may be due to psychogenic causes from a prior office visit, and this idea is only reinforced today.  8. Chronic pain: I have recommended she try asking Dr. Fusco to accept her back. Otherwise, she plans to establish with another PCP. I recommended she go back to the pain clinic, and that she seek psychiatric help, possibly at [facility] [place] or in [place] where she previously saw a psychiatrist, which she has gone to before. 9. Anxiety and panic: She will see her new PCP on 6/9. I will prescribe Xanax 1 mg bid prn with 25 pills and no refills.  Dispo: f/u after cath.  Suresh Koneswaran, M.D., F.A.C.C.  

## 2013-07-05 HISTORY — PX: CARDIAC CATHETERIZATION: SHX172

## 2013-07-08 ENCOUNTER — Telehealth: Payer: Self-pay | Admitting: Cardiovascular Disease

## 2013-07-08 ENCOUNTER — Encounter: Payer: Self-pay | Admitting: *Deleted

## 2013-07-08 NOTE — Telephone Encounter (Signed)
Left heart cath - Dr. Ellyn Hack - 7:30 - Monday, June 8

## 2013-07-12 ENCOUNTER — Encounter (HOSPITAL_COMMUNITY)
Admission: RE | Disposition: A | Discharge status: Home / Self Care | Payer: Self-pay | Source: Ambulatory Visit | Attending: Cardiology

## 2013-07-12 ENCOUNTER — Ambulatory Visit (HOSPITAL_COMMUNITY)
Admission: RE | Admit: 2013-07-12 | Discharge: 2013-07-12 | Disposition: A | Discharge status: Home / Self Care | Payer: Medicare Other | Source: Ambulatory Visit | Attending: Cardiology | Admitting: Cardiology

## 2013-07-12 DIAGNOSIS — F329 Major depressive disorder, single episode, unspecified: Secondary | ICD-10-CM | POA: Diagnosis not present

## 2013-07-12 DIAGNOSIS — I10 Essential (primary) hypertension: Secondary | ICD-10-CM | POA: Diagnosis not present

## 2013-07-12 DIAGNOSIS — E039 Hypothyroidism, unspecified: Secondary | ICD-10-CM | POA: Insufficient documentation

## 2013-07-12 DIAGNOSIS — J449 Chronic obstructive pulmonary disease, unspecified: Secondary | ICD-10-CM | POA: Diagnosis not present

## 2013-07-12 DIAGNOSIS — F411 Generalized anxiety disorder: Secondary | ICD-10-CM | POA: Insufficient documentation

## 2013-07-12 DIAGNOSIS — I252 Old myocardial infarction: Secondary | ICD-10-CM | POA: Diagnosis not present

## 2013-07-12 DIAGNOSIS — I658 Occlusion and stenosis of other precerebral arteries: Secondary | ICD-10-CM | POA: Diagnosis not present

## 2013-07-12 DIAGNOSIS — R079 Chest pain, unspecified: Secondary | ICD-10-CM

## 2013-07-12 DIAGNOSIS — G4730 Sleep apnea, unspecified: Secondary | ICD-10-CM | POA: Insufficient documentation

## 2013-07-12 DIAGNOSIS — R931 Abnormal findings on diagnostic imaging of heart and coronary circulation: Secondary | ICD-10-CM

## 2013-07-12 DIAGNOSIS — I447 Left bundle-branch block, unspecified: Secondary | ICD-10-CM

## 2013-07-12 DIAGNOSIS — I771 Stricture of artery: Secondary | ICD-10-CM | POA: Insufficient documentation

## 2013-07-12 DIAGNOSIS — E785 Hyperlipidemia, unspecified: Secondary | ICD-10-CM | POA: Insufficient documentation

## 2013-07-12 DIAGNOSIS — I509 Heart failure, unspecified: Secondary | ICD-10-CM | POA: Insufficient documentation

## 2013-07-12 DIAGNOSIS — Z8542 Personal history of malignant neoplasm of other parts of uterus: Secondary | ICD-10-CM | POA: Diagnosis not present

## 2013-07-12 DIAGNOSIS — I739 Peripheral vascular disease, unspecified: Secondary | ICD-10-CM | POA: Insufficient documentation

## 2013-07-12 DIAGNOSIS — F410 Panic disorder [episodic paroxysmal anxiety] without agoraphobia: Secondary | ICD-10-CM | POA: Insufficient documentation

## 2013-07-12 DIAGNOSIS — G8929 Other chronic pain: Secondary | ICD-10-CM | POA: Insufficient documentation

## 2013-07-12 DIAGNOSIS — I6529 Occlusion and stenosis of unspecified carotid artery: Secondary | ICD-10-CM | POA: Diagnosis not present

## 2013-07-12 DIAGNOSIS — Z7982 Long term (current) use of aspirin: Secondary | ICD-10-CM | POA: Diagnosis not present

## 2013-07-12 DIAGNOSIS — F17200 Nicotine dependence, unspecified, uncomplicated: Secondary | ICD-10-CM | POA: Insufficient documentation

## 2013-07-12 DIAGNOSIS — F3289 Other specified depressive episodes: Secondary | ICD-10-CM | POA: Insufficient documentation

## 2013-07-12 DIAGNOSIS — J4489 Other specified chronic obstructive pulmonary disease: Secondary | ICD-10-CM | POA: Insufficient documentation

## 2013-07-12 DIAGNOSIS — K219 Gastro-esophageal reflux disease without esophagitis: Secondary | ICD-10-CM | POA: Diagnosis not present

## 2013-07-12 HISTORY — PX: LEFT HEART CATHETERIZATION WITH CORONARY ANGIOGRAM: SHX5451

## 2013-07-12 LAB — BASIC METABOLIC PANEL
BUN: 10 mg/dL (ref 6–23)
CHLORIDE: 103 meq/L (ref 96–112)
CO2: 29 meq/L (ref 19–32)
Calcium: 9.5 mg/dL (ref 8.4–10.5)
Creatinine, Ser: 0.83 mg/dL (ref 0.50–1.10)
GFR calc Af Amer: 90 mL/min — ABNORMAL LOW (ref >90)
GFR calc non Af Amer: 77 mL/min — ABNORMAL LOW (ref >90)
Glucose, Bld: 114 mg/dL — ABNORMAL HIGH (ref 70–99)
Potassium: 3.8 mEq/L (ref 3.7–5.3)
Sodium: 142 mEq/L (ref 137–147)

## 2013-07-12 LAB — CBC
HEMATOCRIT: 39.1 % (ref 36.0–46.0)
Hemoglobin: 13.1 g/dL (ref 12.0–15.0)
MCH: 30.3 pg (ref 26.0–34.0)
MCHC: 33.5 g/dL (ref 30.0–36.0)
MCV: 90.5 fL (ref 78.0–100.0)
Platelets: 299 10*3/uL (ref 150–400)
RBC: 4.32 MIL/uL (ref 3.87–5.11)
RDW: 12.8 % (ref 11.5–15.5)
WBC: 8.5 10*3/uL (ref 4.0–10.5)

## 2013-07-12 LAB — PROTIME-INR
INR: 0.88 (ref 0.00–1.49)
Prothrombin Time: 11.8 seconds (ref 11.6–15.2)

## 2013-07-12 SURGERY — LEFT HEART CATHETERIZATION WITH CORONARY ANGIOGRAM
Anesthesia: LOCAL

## 2013-07-12 MED ORDER — MIDAZOLAM HCL 2 MG/2ML IJ SOLN
INTRAMUSCULAR | Status: AC
  Filled 2013-07-12: qty 2

## 2013-07-12 MED ORDER — VERAPAMIL HCL 2.5 MG/ML IV SOLN
INTRAVENOUS | Status: AC
  Filled 2013-07-12: qty 2

## 2013-07-12 MED ORDER — FENTANYL CITRATE 0.05 MG/ML IJ SOLN
INTRAMUSCULAR | Status: AC
  Filled 2013-07-12: qty 2

## 2013-07-12 MED ORDER — ONDANSETRON HCL 4 MG/2ML IJ SOLN: 4.0000 mg | Freq: Four times a day (QID) | INTRAMUSCULAR | Status: DC | PRN

## 2013-07-12 MED ORDER — SODIUM CHLORIDE 0.9 % IJ SOLN: 3.0000 mL | INTRAMUSCULAR | Status: DC | PRN

## 2013-07-12 MED ORDER — LIDOCAINE HCL (PF) 1 % IJ SOLN
INTRAMUSCULAR | Status: AC
  Filled 2013-07-12: qty 30

## 2013-07-12 MED ORDER — ACETAMINOPHEN 325 MG PO TABS: 650.0000 mg | ORAL_TABLET | ORAL | Status: DC | PRN

## 2013-07-12 MED ORDER — SODIUM CHLORIDE 0.9 % IV SOLN: 250.0000 mL | INTRAVENOUS | Status: DC | PRN

## 2013-07-12 MED ORDER — OXYCODONE HCL 5 MG PO TABS
30.0000 mg | ORAL_TABLET | Freq: Once | ORAL | Status: AC
  Administered 2013-07-12: 30 mg via ORAL
  Filled 2013-07-12 (×2): qty 6

## 2013-07-12 MED ORDER — HEPARIN (PORCINE) IN NACL 2-0.9 UNIT/ML-% IJ SOLN
INTRAMUSCULAR | Status: AC
  Filled 2013-07-12: qty 1000

## 2013-07-12 MED ORDER — SODIUM CHLORIDE 0.9 % IJ SOLN: 3.0000 mL | Freq: Two times a day (BID) | INTRAMUSCULAR | Status: DC

## 2013-07-12 MED ORDER — SODIUM CHLORIDE 0.9 % IV SOLN: INTRAVENOUS | Status: DC

## 2013-07-12 MED ORDER — NITROGLYCERIN 0.2 MG/ML ON CALL CATH LAB
INTRAVENOUS | Status: AC
  Filled 2013-07-12: qty 1

## 2013-07-12 NOTE — CV Procedure (Signed)
    Cardiac Catheterization Procedure Note  Name: [name] MRN: [id number] DOB: 1955-07-05  Procedure: Left Heart Cath, Selective Coronary Angiography, LV angiography  Indication: Chest pain, abnormal Cardiolite.   Procedural Details: The right wrist was prepped, draped, and anesthetized with 1% lidocaine. Using the modified Seldinger technique, a 5 French sheath was introduced into the right radial artery. 3 mg of verapamil was administered through the sheath, weight-based unfractionated heparin was administered intravenously. Standard catheters (JL3.5, 3DRC, pigtail) were used for selective coronary angiography and left ventriculography. Catheter exchanges were performed over an exchange length guidewire. There were no immediate procedural complications. A TR band was used for radial hemostasis at the completion of the procedure.  The patient was transferred to the post catheterization recovery area for further monitoring.  Procedural Findings: Hemodynamics: AO 142/22 LV 143/69  Coronary angiography: Coronary dominance: right  Left mainstem: There was no left main.  The LAD and LCx have separate ostia.   Left anterior descending (LAD): Separate ostium.  No angiographic CAD.  Left circumflex (LCx): Separate ostium.  No angiographic CAD.  Right coronary artery (RCA): No angiographic CAD.  Left ventriculography: Left ventricular systolic function is normal, LVEF is estimated at 60-65%, there is no significant mitral regurgitation   Final Conclusions:  No angiographic CAD, normal LV systolic function.  Suspect false positive Cardiolite.  Followup with Dr. Jacinta Shoe.   Larey Dresser 07/12/2013, 11:45 AM

## 2013-07-12 NOTE — Discharge Instructions (Signed)

## 2013-07-12 NOTE — H&P (View-Only) (Signed)
Patient ID: [name], female   DOB: 1955-02-11, 58 y.o.   MRN: [id number]      SUBJECTIVE: The patient is a 58 year old woman who is here to followup for chest pain in the setting of a previously abnormal nuclear stress test as well as syncope.  She also has a h/o bilateral carotid artery stenosis, COPD, sleep apnea, left subclavian artery stenosis, HTN (although she denies this), hyperlipidemia (not on a statin), and tobacco abuse. She underwent a dobutamine Cardiolite stress test in December 2014 which revealed anteroseptal infarct with a mild degree of peri-infarct ischemia, LVEF 50%.   She sees Dr. Bridgett Larsson of [facility]. She was offered a CT to better delineate her left subclavian artery stenosis but she declined.  She told me at her last visit that she was fired from Dr. Nolon Rod practice for going through the back door.   She was recently evaluated at [facility] for chest pain. Her troponin was normal and her pain appeared atypical. ECG demonstrated a left bundle branch block, QRS duration 132 ms. ECG from January 2015 demonstrated QRS 96 ms. Her d-dimer was 0.45, sodium 137, potassium low at 2.9, bicarbonate 27, and CBC was reportedly normal. Followup potassium was 3.7. BUN was 10 and creatinine 0.85.  She has run out of her Xanax and has been struggling with anxiety and panic attacks. She has had recurrent left precordial chest pain, and is uncertain if it is from her heart or from panic attacks.  She has reduced her cigarette smoking.  She has found a PCP whom she is scheduled to see on 6/9.   Allergies  Allergen Reactions  . Acetaminophen-Codeine Nausea And Vomiting  . Adhesive [Tape]   . Aspirin Nausea And Vomiting  . Celecoxib   . Levaquin [Levofloxacin In D5w] Itching  . Lipitor [Atorvastatin] Other (See Comments)    Myalgias   . Sulfa Antibiotics   . Latex Rash    Current Outpatient Prescriptions  Medication Sig Dispense Refill  . albuterol (PROAIR HFA)  108 (90 BASE) MCG/ACT inhaler Inhale 2 puffs into the lungs every 6 (six) hours as needed for wheezing or shortness of breath.      Marland Kitchen albuterol (PROVENTIL) 2 MG tablet Take 2 mg by mouth 3 (three) times daily. For shortness of breath      . ALPRAZolam (XANAX) 1 MG tablet Take 1 mg by mouth 4 (four) times daily as needed.       Marland Kitchen aspirin EC 81 MG tablet Take 1 tablet (81 mg total) by mouth daily.      . busPIRone (BUSPAR) 15 MG tablet Take 15 mg by mouth 2 (two) times daily.      . diphenhydrAMINE (BENADRYL) 25 MG tablet Take 25 mg by mouth every 6 (six) hours as needed.      . doxycycline (VIBRA-TABS) 100 MG tablet Take 100 mg by mouth 2 (two) times daily.      . DULoxetine (CYMBALTA) 60 MG capsule Take 120 mg by mouth daily.       . hydrOXYzine (ATARAX/VISTARIL) 25 MG tablet Take 1-2 tablets (25-50 mg total) by mouth every 6 (six) hours as needed for itching.  20 tablet  0  . isosorbide mononitrate (IMDUR) 30 MG 24 hr tablet Take 1 tablet (30 mg total) by mouth daily.  30 tablet  6  . levothyroxine (LEVOTHROID) 25 MCG tablet Take 25 mcg by mouth every morning.       . methocarbamol (ROBAXIN) 500 MG  tablet Take 500 mg by mouth every 8 (eight) hours as needed for muscle spasms.      Marland Kitchen NEXIUM 40 MG capsule Take 40 mg by mouth every morning.       Marland Kitchen oxycodone (ROXICODONE) 30 MG immediate release tablet Take 1 tablet (30 mg total) by mouth 2 (two) times daily as needed for pain.  30 tablet  0  . tiZANidine (ZANAFLEX) 4 MG tablet Take 4 mg by mouth every 8 (eight) hours as needed for muscle spasms.      . traMADol (ULTRAM) 50 MG tablet Take 50 mg by mouth every 6 (six) hours as needed.       No current facility-administered medications for this visit.    Past Medical History  Diagnosis Date  . Near syncope   . Anxiety and depression   . Hyperlipidemia   . Hypertension   . Hypothyroid   . Tobacco abuse     1/2 pack per day  . Chronic pain 11/22/2010  . Migraine headache 11/22/2010  .  Gastroesophageal reflux disease 11/22/2010  . Pneumonia   . Bronchitis   . CHF (congestive heart failure)   . Anemia   . Myocardial infarction   . Peripheral vascular disease   . Cancer     uterus    Past Surgical History  Procedure Laterality Date  . Cholecystectomy  2007  . Abdominal hysterectomy      without oophorectomy for neoplastic disease  . Nasal sinus surgery    . Dilation and curettage of uterus    . Lumbar laminectomy  x3    Left; complicated by neurologic dysfunction  . Laparotomy    . Carpal tunnel release      Left    History   Social History  . Marital Status: Married    Spouse Name: N/A    Number of Children: 2  . Years of Education: N/A   Occupational History  .      Disabled   Social History Main Topics  . Smoking status: Current Every Day Smoker -- 0.50 packs/day for 40 years    Types: Cigarettes    Start date: 09/06/1972  . Smokeless tobacco: Never Used  . Alcohol Use: No  . Drug Use: No  . Sexual Activity: Not Currently   Other Topics Concern  . Not on file   Social History Narrative  . No narrative on file     Filed Vitals:   06/30/13 1543  BP: 91/66  Pulse: 99  Height: 5' 2.5" (1.588 m)  Weight: 129 lb (58.514 kg)  SpO2: 97%    PHYSICAL EXAM General: NAD, anxious Neck: No JVD, no thyromegaly. Lungs: Clear to auscultation bilaterally with normal respiratory effort. CV: Nondisplaced PMI.  Regular rate and rhythm, normal S1/S2, no S3/S4, no murmur. No pretibial or periankle edema.  Abdomen: Soft, nontender, no hepatosplenomegaly, no distention.  Neurologic: Alert and oriented x 3.  Psych: Anxious. Extremities: No clubbing or cyanosis.   ECG: reviewed and available in electronic records.      ASSESSMENT AND PLAN: 1. HTN: Denies having a h/o of this, and it is presently low normal.  2. Hyperlipidemia: Due to significant myalgias, I previously d/c Lipitor. I may try pravastatin in the future. Will plan to check lipids  in near future along with LFT's.  3. Carotid artery stenosis: Mild to moderate bilaterally. Less than 40% b/l on 3/27. Known subclavian steal.  4. Subclavian artery stenosis: Declined CT. Follows up with vascular  surgery.  5. Tobacco abuse: now trying to quit.  6. Chest pain: Dobutamine Cardiolite stress test revealed anteroseptal infarct with a mild degree of peri-infarct ischemia, indicative of probable ischemic CAD. LVEF 50%. Previously d/c statin therapy due to myalgias. Will not prescribe beta blockers due to significant COPD. Increase of Imdur to 60 mg daily did not make much of a difference, and I reduced it back to 30 mg daily at her last visit.  As she has had recurrent chest pain, now in the setting of a LBBB with a prior abnormal stress test, will proceed with coronary angiography. While some of her symptoms may in fact be related to her anxiety and panic attacks, I do not want to overlook potential ischemic heart disease. 7. Syncope: No further recurrences, unclear etiology. I may consider an event monitor in the future. Again, there was some suggestion that this may be due to psychogenic causes from a prior office visit, and this idea is only reinforced today.  8. Chronic pain: I have recommended she try asking Dr. Gerarda Fraction to accept her back. Otherwise, she plans to establish with another PCP. I recommended she go back to the pain clinic, and that she seek psychiatric help, possibly at the [facility] in [place] or in [place] where she previously saw a psychiatrist, which she has gone to before. 9. Anxiety and panic: She will see her new PCP on 6/9. I will prescribe Xanax 1 mg bid prn with 25 pills and no refills.  Dispo: f/u after cath.  Kate Sable, M.D., F.A.C.C.

## 2013-07-12 NOTE — Interval H&P Note (Signed)
Cath Lab Visit (complete for each Cath Lab visit)  Clinical Evaluation Leading to the Procedure:   ACS: no  Non-ACS:    Anginal Classification: CCS III  Anti-ischemic medical therapy: No Therapy  Non-Invasive Test Results: Intermediate-risk stress test findings: cardiac mortality 1-3%/year  Prior CABG: Previous CABG      History and Physical Interval Note:  07/12/2013 11:04 AM  [name]  has presented today for surgery, with the diagnosis of cp  The various methods of treatment have been discussed with the patient and family. After consideration of risks, benefits and other options for treatment, the patient has consented to  Procedure(s): LEFT HEART CATHETERIZATION WITH CORONARY ANGIOGRAM (N/A) as a surgical intervention .  The patient's history has been reviewed, patient examined, no change in status, stable for surgery.  I have reviewed the patient's chart and labs.  Questions were answered to the patient's satisfaction.     Vanetta Rule Claris Gladden

## 2013-07-15 ENCOUNTER — Emergency Department (HOSPITAL_COMMUNITY)
Admission: EM | Admit: 2013-07-15 | Discharge: 2013-07-15 | Disposition: A | Discharge status: Home / Self Care | Payer: Medicare Other | Attending: Emergency Medicine | Admitting: Emergency Medicine

## 2013-07-15 ENCOUNTER — Encounter (HOSPITAL_COMMUNITY): Payer: Self-pay | Admitting: Emergency Medicine

## 2013-07-15 DIAGNOSIS — I509 Heart failure, unspecified: Secondary | ICD-10-CM | POA: Insufficient documentation

## 2013-07-15 DIAGNOSIS — F3289 Other specified depressive episodes: Secondary | ICD-10-CM | POA: Diagnosis not present

## 2013-07-15 DIAGNOSIS — Z91040 Latex allergy status: Secondary | ICD-10-CM | POA: Diagnosis not present

## 2013-07-15 DIAGNOSIS — I10 Essential (primary) hypertension: Secondary | ICD-10-CM | POA: Diagnosis not present

## 2013-07-15 DIAGNOSIS — K219 Gastro-esophageal reflux disease without esophagitis: Secondary | ICD-10-CM | POA: Diagnosis not present

## 2013-07-15 DIAGNOSIS — Z862 Personal history of diseases of the blood and blood-forming organs and certain disorders involving the immune mechanism: Secondary | ICD-10-CM | POA: Diagnosis not present

## 2013-07-15 DIAGNOSIS — Z8542 Personal history of malignant neoplasm of other parts of uterus: Secondary | ICD-10-CM | POA: Diagnosis not present

## 2013-07-15 DIAGNOSIS — Z760 Encounter for issue of repeat prescription: Secondary | ICD-10-CM

## 2013-07-15 DIAGNOSIS — F411 Generalized anxiety disorder: Secondary | ICD-10-CM | POA: Diagnosis not present

## 2013-07-15 DIAGNOSIS — G608 Other hereditary and idiopathic neuropathies: Secondary | ICD-10-CM | POA: Diagnosis not present

## 2013-07-15 DIAGNOSIS — Z8701 Personal history of pneumonia (recurrent): Secondary | ICD-10-CM | POA: Diagnosis not present

## 2013-07-15 DIAGNOSIS — Z79899 Other long term (current) drug therapy: Secondary | ICD-10-CM | POA: Diagnosis not present

## 2013-07-15 DIAGNOSIS — G8929 Other chronic pain: Secondary | ICD-10-CM | POA: Diagnosis not present

## 2013-07-15 DIAGNOSIS — F329 Major depressive disorder, single episode, unspecified: Secondary | ICD-10-CM | POA: Diagnosis not present

## 2013-07-15 DIAGNOSIS — Z9889 Other specified postprocedural states: Secondary | ICD-10-CM | POA: Insufficient documentation

## 2013-07-15 DIAGNOSIS — F17200 Nicotine dependence, unspecified, uncomplicated: Secondary | ICD-10-CM | POA: Diagnosis not present

## 2013-07-15 DIAGNOSIS — I252 Old myocardial infarction: Secondary | ICD-10-CM | POA: Insufficient documentation

## 2013-07-15 DIAGNOSIS — Z7982 Long term (current) use of aspirin: Secondary | ICD-10-CM | POA: Insufficient documentation

## 2013-07-15 DIAGNOSIS — G8918 Other acute postprocedural pain: Secondary | ICD-10-CM | POA: Diagnosis present

## 2013-07-15 DIAGNOSIS — Z8709 Personal history of other diseases of the respiratory system: Secondary | ICD-10-CM | POA: Diagnosis not present

## 2013-07-15 DIAGNOSIS — E039 Hypothyroidism, unspecified: Secondary | ICD-10-CM | POA: Insufficient documentation

## 2013-07-15 MED ORDER — LORAZEPAM 1 MG PO TABS: 1.0000 mg | ORAL_TABLET | Freq: Two times a day (BID) | ORAL | Status: DC | PRN

## 2013-07-15 NOTE — ED Provider Notes (Signed)
CSN: [id number]     Arrival date & time 07/15/13  1803 History   First MD Initiated Contact with Patient 07/15/13 1855     Chief Complaint  Patient presents with  . Post-op Problem     HPI Pt was seen at 1910.  Per pt, c/o gradual onset and persistence of constant "bruise" to right volar wrist for the past 3 days. Pt is s/p cardiac catheterization 3 days ago, access site was right volar wrist. Pt states she continues to "have a bruise on my wrist" as well as intermittent "shooting pains" and mild "tingling" into her index, middle, ring, and little fingers. Denies injury, no bleeding, no fevers, no swelling, no rash. Pt also c/o "running out of my xanax" and is requesting a refill. Denies SI, no HI, no psychosis.    Past Medical History  Diagnosis Date  . Near syncope   . Anxiety and depression   . Hyperlipidemia   . Hypertension   . Hypothyroid   . Tobacco abuse     1/2 pack per day  . Chronic pain 11/22/2010  . Migraine headache 11/22/2010  . Gastroesophageal reflux disease 11/22/2010  . Pneumonia   . Bronchitis   . CHF (congestive heart failure)   . Anemia   . Myocardial infarction   . Peripheral vascular disease   . Cancer     uterus   Past Surgical History  Procedure Laterality Date  . Cholecystectomy  2007  . Abdominal hysterectomy      without oophorectomy for neoplastic disease  . Nasal sinus surgery    . Dilation and curettage of uterus    . Lumbar laminectomy  x3    Left; complicated by neurologic dysfunction  . Laparotomy    . Carpal tunnel release      Left  . Cardiac catheterization  07/2013    normal coronary arteries   Family History  Problem Relation Age of Onset  . Colon polyps Mother   . Deep vein thrombosis Mother   . Heart disease Mother   . Hypertension Mother   . Bleeding Disorder Mother   . Hypertension Sister   . Heart disease Brother     before age 56  . Hypertension Brother    History  Substance Use Topics  . Smoking status:  Current Every Day Smoker -- 0.50 packs/day for 40 years    Types: Cigarettes    Start date: 09/06/1972  . Smokeless tobacco: Never Used  . Alcohol Use: No    Review of Systems ROS: Statement: All systems negative except as marked or noted in the HPI; Constitutional: Negative for fever and chills. ; ; Eyes: Negative for eye pain, redness and discharge. ; ; ENMT: Negative for ear pain, hoarseness, nasal congestion, sinus pressure and sore throat. ; ; Cardiovascular: Negative for chest pain, palpitations, diaphoresis, dyspnea and peripheral edema. ; ; Respiratory: Negative for cough, wheezing and stridor. ; ; Gastrointestinal: Negative for nausea, vomiting, diarrhea, abdominal pain, blood in stool, hematemesis, jaundice and rectal bleeding. ; ; Genitourinary: Negative for dysuria, flank pain and hematuria. ; ; Musculoskeletal: Negative for back pain and neck pain. Negative for swelling and trauma.; ; Skin: +bruising. Negative for pruritus, rash, abrasions, blisters, and skin lesion.; ; Neuro: +paresthesias. Negative for headache, lightheadedness and neck stiffness. Negative for weakness, altered level of consciousness , altered mental status, extremity weakness, involuntary movement, seizure and syncope.; Psych:  No SI, no SA, no HI, no hallucinations.      Allergies  Acetaminophen-codeine; Aspirin; Celecoxib; Levaquin; Lipitor; Sulfa antibiotics; Adhesive; and Latex  Home Medications   Prior to Admission medications   Medication Sig Start Date End Date Taking? Authorizing Provider  albuterol (PROAIR HFA) 108 (90 BASE) MCG/ACT inhaler Inhale 2 puffs into the lungs every 6 (six) hours as needed for wheezing or shortness of breath.   Yes Historical Provider, MD  albuterol (PROVENTIL) 2 MG tablet Take 2 mg by mouth 3 (three) times daily. For shortness of breath   Yes Historical Provider, MD  ALPRAZolam Duanne Moron) 1 MG tablet Take 1 mg by mouth 4 (four) times daily.    Yes Historical Provider, MD   aspirin EC 81 MG tablet Take 81 mg by mouth at bedtime.   Yes Historical Provider, MD  BIOTIN PO Take 1 capsule by mouth daily.   Yes Historical Provider, MD  busPIRone (BUSPAR) 15 MG tablet Take 15 mg by mouth 2 (two) times daily.   Yes Historical Provider, MD  Cholecalciferol (VITAMIN D PO) Take 1 tablet by mouth daily.   Yes Historical Provider, MD  diphenhydrAMINE (BENADRYL) 25 MG tablet Take 25 mg by mouth at bedtime as needed for sleep.    Yes Historical Provider, MD  DULoxetine (CYMBALTA) 60 MG capsule Take 120 mg by mouth daily.    Yes Historical Provider, MD  EVENING PRIMROSE OIL PO Take 1 capsule by mouth daily.   Yes Historical Provider, MD  isosorbide mononitrate (IMDUR) 30 MG 24 hr tablet Take 1 tablet (30 mg total) by mouth daily. 05/25/13  Yes Herminio Commons, MD  levothyroxine (LEVOTHROID) 25 MCG tablet Take 25 mcg by mouth every morning.    Yes Historical Provider, MD  methocarbamol (ROBAXIN) 500 MG tablet Take 500 mg by mouth every 8 (eight) hours as needed for muscle spasms.   Yes Historical Provider, MD  NEXIUM 40 MG capsule Take 40 mg by mouth 2 (two) times daily before a meal.  12/23/11  Yes Historical Provider, MD  oxycodone (ROXICODONE) 30 MG immediate release tablet Take 1 tablet (30 mg total) by mouth 2 (two) times daily as needed for pain. 05/25/13  Yes Herminio Commons, MD  Potassium 99 MG TABS Take 99 mg by mouth daily.   Yes Historical Provider, MD  tiZANidine (ZANAFLEX) 4 MG tablet Take 4 mg by mouth every 8 (eight) hours as needed for muscle spasms.   Yes Historical Provider, MD  vitamin C (ASCORBIC ACID) 500 MG tablet Take 500 mg by mouth daily.   Yes Historical Provider, MD  VITAMIN E PO Take 1 capsule by mouth daily.   Yes Historical Provider, MD  hydrOXYzine (ATARAX/VISTARIL) 25 MG tablet Take 1-2 tablets (25-50 mg total) by mouth every 6 (six) hours as needed for itching. 03/29/13   Evalee Jefferson, PA-C  LORazepam (ATIVAN) 1 MG tablet Take 1 tablet (1 mg total)  by mouth 2 (two) times daily as needed for anxiety. 07/15/13   Alfonzo Feller, DO   BP 157/70  Pulse 82  Temp(Src) 97.7 F (36.5 C) (Oral)  Resp 18  Ht 5\' 2"  (1.575 m)  Wt 120 lb (54.432 kg)  BMI 21.94 kg/m2  SpO2 98% Physical Exam 1915:  Physical examination:  Nursing notes reviewed; Vital signs and O2 SAT reviewed;  Constitutional: Well developed, Well nourished, Well hydrated, In no acute distress; Head:  Normocephalic, atraumatic; Eyes: EOMI, PERRL, No scleral icterus; ENMT: Mouth and pharynx normal, Mucous membranes moist; Neck: Supple, Full range of motion, No lymphadenopathy; Cardiovascular: Regular rate and  rhythm, No murmur, rub, or gallop; Respiratory: Breath sounds clear & equal bilaterally, No rales, rhonchi, wheezes.  Speaking full sentences with ease, Normal respiratory effort/excursion; Chest: Nontender, Movement normal; Abdomen: Soft, Nontender, Nondistended, Normal bowel sounds; Genitourinary: No CVA tenderness; Extremities: Pulses normal, +mild right lateral volar wrist tenderness to palp with localized fading ecchymosis, no open wounds, no erythema, no deformity, no edema, no hematoma. Strong right radial pulse, no palp thrill. Right hand and all fingers W/D/good color with brisk cap refill in fingertips. Distal NMS intact with right hand having intact and equal strength in the distribution of the median, radial, and ulnar nerve function compared to opposite side. Subjective mild decreased sensation 2nd though 5th fingers (not including thumb).;;  Neuro: AA&Ox3, Major CN grossly intact.  Speech clear. No gross focal motor or sensory deficits in extremities. Climbs on and off stretcher easily by herself. Gait steady.; Skin: Color normal, Warm, Dry.; Psych:  Anxious.    ED Course  Procedures     EKG Interpretation None      MDM  MDM Reviewed: previous chart, nursing note and vitals Reviewed previous: labs     1945   T/C to Cardiology Dr. Bronson Ing, case  discussed, including:  HPI, pertinent PM/SHx, VS/PE, dx testing, ED course and treatment: states he knows pt very well, she has significant hx of anxiety, physical exam today c/w normal post-op course, reassure pt and have her f/u with PMD regarding her anxiety. Dx, as well as d/w Cards MD, d/w pt and family.  Questions answered.  Verb understanding, agreeable to d/c home with outpt f/u.     Alfonzo Feller, DO 07/18/13 1341

## 2013-07-15 NOTE — ED Notes (Signed)
Pt states she had cardiac cath at [facility] on Monday. States bruising and pain to right wrist with shooting pain radiating to fingers of same hand.

## 2013-07-15 NOTE — ED Notes (Signed)
Patient given discharge instruction, verbalized understand. Patient ambulatory out of the department.  

## 2013-07-15 NOTE — ED Notes (Signed)
Had heart cath on Monday about 9am via Dr in [place].  Bruise to right wrist has gotten worse since yesterday from Cath site to right wrist.

## 2013-07-15 NOTE — Discharge Instructions (Signed)
°Emergency Department Resource Guide °1) Find a Doctor and Pay Out of Pocket °Although you won't have to find out who is covered by your insurance plan, it is a good idea to ask around and get recommendations. You will then need to call the office and see if the doctor you have chosen will accept you as a new patient and what types of options they offer for patients who are self-pay. Some doctors offer discounts or will set up payment plans for their patients who do not have insurance, but you will need to ask so you aren't surprised when you get to your appointment. ° °2) Contact Your Local Health Department °Not all health departments have doctors that can see patients for sick visits, but many do, so it is worth a call to see if yours does. If you don't know where your local health department is, you can check in your phone book. The CDC also has a tool to help you locate your state's health department, and many state websites also have listings of all of their local health departments. ° °3) Find a Walk-in Clinic °If your illness is not likely to be very severe or complicated, you may want to try a walk in clinic. These are popping up all over the country in pharmacies, drugstores, and shopping centers. They're usually staffed by nurse practitioners or physician assistants that have been trained to treat common illnesses and complaints. They're usually fairly quick and inexpensive. However, if you have serious medical issues or chronic medical problems, these are probably not your best option. ° °No Primary Care Doctor: °- Call Health Connect at  [phone] - they can help you locate a primary care doctor that  accepts your insurance, provides certain services, etc. °- Physician Referral Service- 1-800-533-3463 ° °Chronic Pain Problems: °Organization         Address  Phone   Notes  °[facility]  ([phone]) [phone] Patients need to be referred by their primary care doctor.  ° °Medication  Assistance: °Organization         Address  Phone   Notes  °[place] [facility] [address] °[place], [place] [address] (336) 641-8030 --Must be a resident of [place] °-- Must have NO insurance coverage whatsoever (no Medicaid/ Medicare, etc.) °-- The pt. MUST have a primary care doctor that directs their care regularly and follows them in the community °  °[facility]  (866) 331-1348   °[employer]  (888) 892-1162   ° °Agencies that provide inexpensive medical care: °Organization         Address  Phone   Notes  °[facility]  (336) 832-8035   ° Internal Medicine    (336) 832-7272   °[facility] [address] °[place], [place] [address] (336) 832-4777   °[facility] [address], °[place] (336) 271-4999   °[facility]    (336) 373-0678   °[facility]    (336) 272-1050   °[facility] ° [address], [place] Phone:  (336) 832-4444, Fax:  (336) 832-4440 Hours of Operation:  9 am - 6 pm, M-F.  Also accepts Medicaid/Medicare and self-pay.  °[facility] ° [address], [place] Phone: (336) [phone], Fax: (336) [phone]. Hours of Operation:  8:30 am - 5:30 pm, M-F.  Also accepts Medicaid and self-pay.  °[facility] [place] [address], [place] Phone: (336) 878-6027   °[facility] [address], [place], [place] (336)723-1848, Ext. 123 Mondays & Thursdays: 7-9 AM.  First 15 patients are seen on a first come, first serve basis. °  ° °Medicaid-accepting [place] Providers: ° °Organization         Address  Phone   Notes  °[facility] [address], [place] (336) 641-2100 Also accepts self-pay patients.  °[facility] [address], [place] ° (336) 856-9996   °[facility] [address], [place]  (336) 288-8857   °[facility] [address] [place] [address], [place] (336) 299-7000   °Veita Bland [address], [place]  ° (336) 373-1557 Only accepts [place] Medicaid patients after they have their name applied to their card.  ° °Self-Pay (no insurance) in [place]: ° °Organization         Address  Phone   Notes  °Sickle Cell Patients, [facility] [address], [place] (336) 832-1970   °[facility] [address], [place] (336) 832-4400   °[facility] ° [address], [place] (336) 992-4800   °[facility]/Dr. Osei-Bonsu ° [address] [place] [address], [place] or [address], [place] (336) [phone] Phone number for both [place] and [place] locations is the same.  °[facility] [address], [place] (336) 299-0000   °[facility] [address] [place] [address], [place] or [address] (336) 852-7530 °(336) 878-2260   °[facility] [address], [place] (336) 350-1642, phone; (336) 294-5005, fax Sees patients 1st and 3rd Saturday of every month.  Must not qualify for public or private insurance (i.e. Medicaid, Medicare, [place] Health Choice, Veterans' Benefits) • Household income should be no more than 200% of the poverty level •The clinic cannot treat you if you are pregnant or think you are pregnant • Sexually transmitted diseases are not treated at the clinic.  ° ° °Dental Care: °Organization         Address  Phone  Notes  °[place] [facility] [address], [place] (336) 641-6152 Accepts children up to age 21 who are enrolled in Medicaid or [place] Health Choice; pregnant women with a Medicaid card; and children who have applied for Medicaid or [place] Health Choice, but were declined, whose parents can pay a reduced fee at time of service.  °[place]  [facility] [place]  [address], [place] (336) 641-7733 Accepts children up to age 21 who are enrolled in Medicaid or [place] Health Choice; pregnant women with a Medicaid card; and children who have applied for Medicaid or [place] Health Choice, but were declined, whose parents can pay a reduced fee at time of service.  °[facility] ° [address], [place] (336) 641-4533 Patients are seen by appointment only. Walk-ins are not accepted. [facility] will see patients 18 years of age and older. °Monday - Tuesday (8am-5pm) °Most Wednesdays (8:30-5pm) °$30 per visit, cash only  °[facility] ° [address], [place] (336) [phone] Patients are seen by appointment only. Walk-ins are not accepted. [facility] will see patients 18 years of age and older. °One   Wednesday Evening (Monthly: Volunteer Based).  $30 per visit, cash only  °[facility]  (919) 537-3737 for adults; Children under age 4, call Graduate Pediatric Dentistry at ([phone]) [phone]. Children aged 4-14, please call ([phone]) [phone] to request a pediatric application. ° Dental services are provided in all areas of dental care including fillings, crowns and bridges, complete and partial dentures, implants, gum treatment, root canals, and extractions. Preventive care is also provided. Treatment is provided to both adults and children. °Patients are selected via a lottery and there is often a waiting list. °  °[facility] [address], °[place] ° (336) 763-8833 [email or web address] °  °[facility] [address], [place], [place] (336)723-1848, Ext. 123 Second and Fourth Thursday of each month, opens at 6:30 AM; Clinic ends at 9 AM.  Patients are seen on a first-come first-served basis, and a limited number are seen during each clinic.  ° °[facility] ° [address], [place], [place] (336) [phone]    Eligibility Requirements °You must have lived in [place], [place], or [place] counties for at least the last three months. °  You cannot be eligible for state or federal sponsored healthcare insurance, including Veterans Administration, Medicaid, or Medicare. °  You generally cannot be eligible for healthcare insurance through your employer.  °  How to apply: °Eligibility screenings are held every Tuesday and Wednesday afternoon from 1:00 pm until 4:00 pm. You do not need an appointment for the interview!  °[facility] [address], [place], [place] 336-631-2330   °[facility]  336-342-8273   °[facility]  336-703-3100   °[facility]  336-570-6415   ° °Behavioral Health Resources in the Community: °Intensive Outpatient Programs °Organization         Address  Phone  Notes  °[facility] [address], [place], [place] 336-878-6098   °[facility] [address], [place], [place] 336-832-9800   °[facility] [address], [place], [place] ° 336-882-2125   °[facility] [address],  °[place], [place] 1-800-853-5163 or 336-641-4981   °Substance Abuse Resources °Organization         Address  Phone  Notes  °[facility]  336-882-2125   °[facility]  336-784-9470   °[facility]  336-285-9073   °[facility]  336-845-3988   °Residential & Outpatient Substance Abuse Program  1-800-659-3381   °Psychological Services °Organization         Address  Phone  Notes  ° Health  336- 832-9600   °[facility]  336- 378-7881   °[facility] [address], [place] 1-800-853-5163 or 336-641-4981   ° °Mobile Crisis Teams °Organization         Address  Phone  Notes  °[facility], Mobile Crisis Care Unit  1-877-626-1772   °Assertive °[facility] ° [address]  [place], [place] 336-834-9664   °Sharon DeEsch [address] °[place] 336-554-5454   ° °Self-Help/Support Groups °Organization         Address  Phone             Notes  °[facility] [place] - variety of support groups  336- 373-1402 Call for more information  °Narcotics Anonymous (NA), [facility] [address], °[place]  2 meetings at this location  ° °  Residential Treatment Programs Organization         Address  Phone  Notes  [facility] [address]    [place]  1-867-737-0284   [facility]  [address], [place] [address], [place], [place]   [place], [place] (404) 372-4921 Admissions: 8am-3pm M-F  [facility] [place] [address],    [place], [place] 103-159-4585   [facility] [address] [place], [place], [place]   [facility] [address],  [place], [place]   [facility] - Intensive Outpatient [address], [place], [place]   [facility] ([place] [place]  [place], [place] 1-680-682-5427 or (717)759-0102   [facility] ([facility] [address] [place], [place] Accepts Medicaid  [facility] [address],  [place] 1-316-300-6890 Substance Abuse/Addiction Treatment   [facility]         Address  Phone  Notes  [facility]  [phone]   Domenic Schwab, PhD [address] [place], [place]   667-861-7826 or [phone]   [place], [place] 831-306-4595   [facility] [address] [place], [place] 432-292-5304 Insurance/Medicaid/sponsorship through [facility] [address] [place]                                    [place], [place] [phone] [place] [address], [place] ([phone]    Dr. Adele Schilder  ([phone]   [facility] Dept. 1) [address] [place] 2) [place] 3)  [place] [address], [place] [phone] [phone]  [phone]   [place] [phone] or ([phone] (After Hours)       Take the prescription as directed.  Call your regular medical doctor and your mental health provider tomorrow to schedule a follow up appointment within the next week. Call your Cardiologist to schedule a follow up appointment if you are not improving within the next several days. Return to the Emergency Department immediately sooner if worsening.

## 2013-07-21 NOTE — Telephone Encounter (Signed)
[employer] TJLL#[id number] exp 08-22-13

## 2013-07-28 ENCOUNTER — Telehealth: Payer: Self-pay | Admitting: *Deleted

## 2013-07-28 NOTE — Telephone Encounter (Signed)
Patient called to request if you would give her one more refill on her Xanax.  Asking for #20 tabs.  Stated she has filled out paper work for [employer] & is awaiting a call back from them regarding a new patient appointment.  Please advise.

## 2013-07-29 NOTE — Telephone Encounter (Signed)
Left message to return call 

## 2013-07-29 NOTE — Telephone Encounter (Signed)
She will have to f/u with new PCP at [facility], or seek behavioral health care at a specialized clinic.

## 2013-07-30 NOTE — Telephone Encounter (Signed)
Patient notified via voice mail.   Follow up scheduled for 08/04/13 for post cath visit.

## 2013-08-02 ENCOUNTER — Ambulatory Visit: Payer: Medicare Other | Admitting: Cardiovascular Disease

## 2013-08-04 ENCOUNTER — Ambulatory Visit (INDEPENDENT_AMBULATORY_CARE_PROVIDER_SITE_OTHER): Payer: Medicare Other | Admitting: Cardiovascular Disease

## 2013-08-04 VITALS — BP 127/90 | HR 92 | Ht 62.5 in | Wt 129.0 lb

## 2013-08-04 DIAGNOSIS — G8929 Other chronic pain: Secondary | ICD-10-CM

## 2013-08-04 DIAGNOSIS — R079 Chest pain, unspecified: Secondary | ICD-10-CM

## 2013-08-04 DIAGNOSIS — F411 Generalized anxiety disorder: Secondary | ICD-10-CM

## 2013-08-04 NOTE — Progress Notes (Signed)
Patient ID: [name], female   DOB: 28-Oct-1955, 58 y.o.   MRN: [id number]      SUBJECTIVE: The patient underwent coronary angiography which demonstrated no left main coronary artery with separate ostia for the LAD and circumflex. There was no angiographic evidence of coronary artery disease. She is scheduled to see a new PCP next week.    Allergies  Allergen Reactions  . Acetaminophen-Codeine Nausea And Vomiting  . Aspirin Nausea And Vomiting    (can take coated aspirin)  . Celecoxib Other (See Comments)    "does the opposite of what it is suppose to do"  . Levaquin [Levofloxacin In D5w] Itching  . Lipitor [Atorvastatin] Other (See Comments)    Myalgias   . Sulfa Antibiotics Nausea And Vomiting  . Adhesive [Tape] Rash  . Latex Rash    Current Outpatient Prescriptions  Medication Sig Dispense Refill  . albuterol (PROAIR HFA) 108 (90 BASE) MCG/ACT inhaler Inhale 2 puffs into the lungs every 6 (six) hours as needed for wheezing or shortness of breath.      Marland Kitchen albuterol (PROVENTIL) 2 MG tablet Take 2 mg by mouth 3 (three) times daily. For shortness of breath      . ALPRAZolam (XANAX) 1 MG tablet Take 1 mg by mouth 4 (four) times daily.       Marland Kitchen aspirin EC 81 MG tablet Take 81 mg by mouth at bedtime.      Marland Kitchen BIOTIN PO Take 1 capsule by mouth daily.      . busPIRone (BUSPAR) 15 MG tablet Take 15 mg by mouth 2 (two) times daily.      . Cholecalciferol (VITAMIN D PO) Take 1 tablet by mouth daily.      . diphenhydrAMINE (BENADRYL) 25 MG tablet Take 25 mg by mouth at bedtime as needed for sleep.       . DULoxetine (CYMBALTA) 60 MG capsule Take 120 mg by mouth daily.       Marland Kitchen EVENING PRIMROSE OIL PO Take 1 capsule by mouth daily.      . hydrOXYzine (ATARAX/VISTARIL) 25 MG tablet Take 1-2 tablets (25-50 mg total) by mouth every 6 (six) hours as needed for itching.  20 tablet  0  . isosorbide mononitrate (IMDUR) 30 MG 24 hr tablet Take 1 tablet (30 mg total) by mouth daily.  30 tablet  6    . levothyroxine (LEVOTHROID) 25 MCG tablet Take 25 mcg by mouth every morning.       Marland Kitchen LORazepam (ATIVAN) 1 MG tablet Take 1 tablet (1 mg total) by mouth 2 (two) times daily as needed for anxiety.  10 tablet  0  . methocarbamol (ROBAXIN) 500 MG tablet Take 500 mg by mouth every 8 (eight) hours as needed for muscle spasms.      Marland Kitchen NEXIUM 40 MG capsule Take 40 mg by mouth 2 (two) times daily before a meal.       . oxycodone (ROXICODONE) 30 MG immediate release tablet Take 1 tablet (30 mg total) by mouth 2 (two) times daily as needed for pain.  30 tablet  0  . Potassium 99 MG TABS Take 99 mg by mouth daily.      Marland Kitchen tiZANidine (ZANAFLEX) 4 MG tablet Take 4 mg by mouth every 8 (eight) hours as needed for muscle spasms.      . vitamin C (ASCORBIC ACID) 500 MG tablet Take 500 mg by mouth daily.      Marland Kitchen VITAMIN E PO Take 1  capsule by mouth daily.       No current facility-administered medications for this visit.    Past Medical History  Diagnosis Date  . Near syncope   . Anxiety and depression   . Hyperlipidemia   . Hypertension   . Hypothyroid   . Tobacco abuse     1/2 pack per day  . Chronic pain 11/22/2010  . Migraine headache 11/22/2010  . Gastroesophageal reflux disease 11/22/2010  . Pneumonia   . Bronchitis   . CHF (congestive heart failure)   . Anemia   . Myocardial infarction   . Peripheral vascular disease   . Cancer     uterus    Past Surgical History  Procedure Laterality Date  . Cholecystectomy  2007  . Abdominal hysterectomy      without oophorectomy for neoplastic disease  . Nasal sinus surgery    . Dilation and curettage of uterus    . Lumbar laminectomy  x3    Left; complicated by neurologic dysfunction  . Laparotomy    . Carpal tunnel release      Left  . Cardiac catheterization  07/2013    normal coronary arteries    History   Social History  . Marital Status: Married    Spouse Name: N/A    Number of Children: 2  . Years of Education: N/A    Occupational History  .      Disabled   Social History Main Topics  . Smoking status: Current Every Day Smoker -- 0.50 packs/day for 40 years    Types: Cigarettes    Start date: 09/06/1972  . Smokeless tobacco: Never Used  . Alcohol Use: No  . Drug Use: No  . Sexual Activity: Not Currently   Other Topics Concern  . Not on file   Social History Narrative  . No narrative on file     Filed Vitals:   08/04/13 1449  BP: 127/90  Pulse: 92  Height: 5' 2.5" (1.588 m)  Weight: 129 lb (58.514 kg)    PHYSICAL EXAM General: NAD, anxious Neck: No JVD, no thyromegaly. Lungs: Clear to auscultation bilaterally with normal respiratory effort. CV: Nondisplaced PMI.  Regular rate and rhythm, normal S1/S2, no S3/S4, no murmur. No pretibial or periankle edema.  No carotid bruit.  Normal pedal pulses.  Abdomen: Soft, nontender, no hepatosplenomegaly, no distention.  Neurologic: Alert and oriented x 3.  Psych: Anxious. Extremities: No clubbing or cyanosis.   ECG: reviewed and available in electronic records.      ASSESSMENT AND PLAN: 1. Chest pain: Noncardiac in etiology, and likely secondary to panic attacks. Normal coronary angiography. False positive stress test. 2. PVD: Followed by [facility].  Dispo: f/u prn.   Kate Sable, M.D., F.A.C.C.

## 2013-08-04 NOTE — Patient Instructions (Signed)
Continue all current medications. Follow up as needed  

## 2013-09-03 ENCOUNTER — Other Ambulatory Visit (HOSPITAL_COMMUNITY): Payer: Self-pay | Admitting: Neurological Surgery

## 2013-09-03 DIAGNOSIS — M545 Low back pain: Secondary | ICD-10-CM

## 2013-09-08 ENCOUNTER — Ambulatory Visit (HOSPITAL_COMMUNITY)
Admission: RE | Admit: 2013-09-08 | Discharge: 2013-09-08 | Disposition: A | Discharge status: Home / Self Care | Payer: Medicare Other | Source: Ambulatory Visit | Attending: Neurological Surgery | Admitting: Neurological Surgery

## 2013-09-08 DIAGNOSIS — M545 Low back pain, unspecified: Secondary | ICD-10-CM | POA: Insufficient documentation

## 2013-09-08 DIAGNOSIS — R209 Unspecified disturbances of skin sensation: Secondary | ICD-10-CM | POA: Diagnosis not present

## 2013-09-08 DIAGNOSIS — Z981 Arthrodesis status: Secondary | ICD-10-CM | POA: Diagnosis not present

## 2013-09-08 DIAGNOSIS — M48061 Spinal stenosis, lumbar region without neurogenic claudication: Secondary | ICD-10-CM | POA: Diagnosis not present

## 2013-09-08 LAB — POCT I-STAT CREATININE: Creatinine, Ser: 1.1 mg/dL (ref 0.50–1.10)

## 2013-09-08 MED ORDER — GADOBENATE DIMEGLUMINE 529 MG/ML IV SOLN
10.0000 mL | Freq: Once | INTRAVENOUS | Status: AC | PRN
  Administered 2013-09-08: 10 mL via INTRAVENOUS

## 2013-12-01 ENCOUNTER — Emergency Department (HOSPITAL_COMMUNITY): Payer: Medicare Other

## 2013-12-01 ENCOUNTER — Emergency Department (HOSPITAL_COMMUNITY)
Admission: EM | Admit: 2013-12-01 | Discharge: 2013-12-01 | Disposition: A | Discharge status: Home / Self Care | Payer: Medicare Other | Attending: Emergency Medicine | Admitting: Emergency Medicine

## 2013-12-01 ENCOUNTER — Encounter (HOSPITAL_COMMUNITY): Payer: Self-pay | Admitting: Emergency Medicine

## 2013-12-01 DIAGNOSIS — Z862 Personal history of diseases of the blood and blood-forming organs and certain disorders involving the immune mechanism: Secondary | ICD-10-CM | POA: Insufficient documentation

## 2013-12-01 DIAGNOSIS — I509 Heart failure, unspecified: Secondary | ICD-10-CM | POA: Insufficient documentation

## 2013-12-01 DIAGNOSIS — Z91040 Latex allergy status: Secondary | ICD-10-CM | POA: Diagnosis not present

## 2013-12-01 DIAGNOSIS — M25572 Pain in left ankle and joints of left foot: Secondary | ICD-10-CM

## 2013-12-01 DIAGNOSIS — F419 Anxiety disorder, unspecified: Secondary | ICD-10-CM | POA: Insufficient documentation

## 2013-12-01 DIAGNOSIS — Z8709 Personal history of other diseases of the respiratory system: Secondary | ICD-10-CM | POA: Diagnosis not present

## 2013-12-01 DIAGNOSIS — E039 Hypothyroidism, unspecified: Secondary | ICD-10-CM | POA: Diagnosis not present

## 2013-12-01 DIAGNOSIS — F329 Major depressive disorder, single episode, unspecified: Secondary | ICD-10-CM | POA: Insufficient documentation

## 2013-12-01 DIAGNOSIS — I252 Old myocardial infarction: Secondary | ICD-10-CM | POA: Insufficient documentation

## 2013-12-01 DIAGNOSIS — G43909 Migraine, unspecified, not intractable, without status migrainosus: Secondary | ICD-10-CM | POA: Insufficient documentation

## 2013-12-01 DIAGNOSIS — Z8541 Personal history of malignant neoplasm of cervix uteri: Secondary | ICD-10-CM | POA: Diagnosis not present

## 2013-12-01 DIAGNOSIS — Z8701 Personal history of pneumonia (recurrent): Secondary | ICD-10-CM | POA: Diagnosis not present

## 2013-12-01 DIAGNOSIS — K219 Gastro-esophageal reflux disease without esophagitis: Secondary | ICD-10-CM | POA: Insufficient documentation

## 2013-12-01 DIAGNOSIS — I10 Essential (primary) hypertension: Secondary | ICD-10-CM | POA: Diagnosis not present

## 2013-12-01 DIAGNOSIS — E785 Hyperlipidemia, unspecified: Secondary | ICD-10-CM | POA: Insufficient documentation

## 2013-12-01 DIAGNOSIS — M7732 Calcaneal spur, left foot: Secondary | ICD-10-CM

## 2013-12-01 DIAGNOSIS — G8929 Other chronic pain: Secondary | ICD-10-CM | POA: Insufficient documentation

## 2013-12-01 DIAGNOSIS — Z79899 Other long term (current) drug therapy: Secondary | ICD-10-CM | POA: Diagnosis not present

## 2013-12-01 DIAGNOSIS — Z7952 Long term (current) use of systemic steroids: Secondary | ICD-10-CM | POA: Insufficient documentation

## 2013-12-01 DIAGNOSIS — R52 Pain, unspecified: Secondary | ICD-10-CM

## 2013-12-01 MED ORDER — TRAMADOL HCL 50 MG PO TABS: 50.0000 mg | ORAL_TABLET | Freq: Four times a day (QID) | ORAL | Status: DC | PRN

## 2013-12-01 MED ORDER — OXYCODONE-ACETAMINOPHEN 5-325 MG PO TABS
1.0000 | ORAL_TABLET | Freq: Once | ORAL | Status: AC
  Administered 2013-12-01: 1 via ORAL
  Filled 2013-12-01: qty 1

## 2013-12-01 NOTE — Discharge Instructions (Signed)

## 2013-12-01 NOTE — ED Notes (Signed)
Pain lt foot and ankle since Sunday, NO known injury.

## 2013-12-02 ENCOUNTER — Ambulatory Visit (INDEPENDENT_AMBULATORY_CARE_PROVIDER_SITE_OTHER): Payer: 59 | Admitting: Cardiovascular Disease

## 2013-12-02 ENCOUNTER — Encounter: Payer: Self-pay | Admitting: Cardiovascular Disease

## 2013-12-02 VITALS — BP 155/77 | HR 102 | Ht 62.0 in | Wt 130.0 lb

## 2013-12-02 DIAGNOSIS — R030 Elevated blood-pressure reading, without diagnosis of hypertension: Secondary | ICD-10-CM

## 2013-12-02 DIAGNOSIS — I708 Atherosclerosis of other arteries: Secondary | ICD-10-CM | POA: Diagnosis not present

## 2013-12-02 DIAGNOSIS — R079 Chest pain, unspecified: Secondary | ICD-10-CM

## 2013-12-02 DIAGNOSIS — F411 Generalized anxiety disorder: Secondary | ICD-10-CM | POA: Diagnosis not present

## 2013-12-02 DIAGNOSIS — I771 Stricture of artery: Secondary | ICD-10-CM

## 2013-12-02 DIAGNOSIS — IMO0001 Reserved for inherently not codable concepts without codable children: Secondary | ICD-10-CM

## 2013-12-02 DIAGNOSIS — Z720 Tobacco use: Secondary | ICD-10-CM | POA: Diagnosis not present

## 2013-12-02 DIAGNOSIS — S99921A Unspecified injury of right foot, initial encounter: Secondary | ICD-10-CM

## 2013-12-02 NOTE — Progress Notes (Signed)
Patient ID: [name], female   DOB: Aug 30, 1955, 58 y.o.   MRN: [id number]      SUBJECTIVE: The patient has a h/o noncardiac chest pain, HTN, hyperlipidemia, tobacco abuse, anxiety, and depression. She also has a h/o bilateral carotid artery stenosis, COPD, sleep apnea, left subclavian artery stenosis, chronic pain, and panic disorder. She recently injured her left foot but she isn't certain how. She denies trauma. She said she has a heel spur. Her BP only becomes elevated when she is in pain, as it is low otherwise. She is quitting smoking on 10/31 and has Chantix.   Review of Systems: As per "subjective", otherwise negative.  Allergies  Allergen Reactions  . Acetaminophen-Codeine Nausea And Vomiting  . Aspirin Nausea And Vomiting    (can take coated aspirin)  . Celecoxib Other (See Comments)    "does the opposite of what it is suppose to do"  . Levaquin [Levofloxacin In D5w] Itching  . Lipitor [Atorvastatin] Other (See Comments)    Myalgias   . Sulfa Antibiotics Nausea And Vomiting  . Adhesive [Tape] Rash  . Latex Rash    Current Outpatient Prescriptions  Medication Sig Dispense Refill  . albuterol (PROAIR HFA) 108 (90 BASE) MCG/ACT inhaler Inhale 2 puffs into the lungs every 6 (six) hours as needed for wheezing or shortness of breath.      Marland Kitchen albuterol (PROVENTIL) 2 MG tablet Take 2 mg by mouth 3 (three) times daily. For shortness of breath      . ALPRAZolam (XANAX) 1 MG tablet Take 1 mg by mouth 4 (four) times daily.       Marland Kitchen aspirin EC 81 MG tablet Take 81 mg by mouth at bedtime.      Marland Kitchen BIOTIN PO Take 1 capsule by mouth daily.      . busPIRone (BUSPAR) 15 MG tablet Take 15 mg by mouth 2 (two) times daily.      . Cholecalciferol (VITAMIN D PO) Take 1 tablet by mouth daily.      . diphenhydrAMINE (BENADRYL) 25 MG tablet Take 25 mg by mouth at bedtime as needed for sleep.       . DULoxetine (CYMBALTA) 60 MG capsule Take 120 mg by mouth daily.       Marland Kitchen EVENING PRIMROSE OIL  PO Take 1 capsule by mouth daily.      . hydrOXYzine (ATARAX/VISTARIL) 25 MG tablet Take 1-2 tablets (25-50 mg total) by mouth every 6 (six) hours as needed for itching.  20 tablet  0  . isosorbide mononitrate (IMDUR) 30 MG 24 hr tablet Take 1 tablet (30 mg total) by mouth daily.  30 tablet  6  . levothyroxine (LEVOTHROID) 25 MCG tablet Take 25 mcg by mouth every morning.       Marland Kitchen LORazepam (ATIVAN) 1 MG tablet Take 1 tablet (1 mg total) by mouth 2 (two) times daily as needed for anxiety.  10 tablet  0  . methocarbamol (ROBAXIN) 500 MG tablet Take 500 mg by mouth every 8 (eight) hours as needed for muscle spasms.      Marland Kitchen NEXIUM 40 MG capsule Take 40 mg by mouth 2 (two) times daily before a meal.       . oxycodone (ROXICODONE) 30 MG immediate release tablet Take 1 tablet (30 mg total) by mouth 2 (two) times daily as needed for pain.  30 tablet  0  . Potassium 99 MG TABS Take 99 mg by mouth daily.      Marland Kitchen  tiZANidine (ZANAFLEX) 4 MG tablet Take 4 mg by mouth every 8 (eight) hours as needed for muscle spasms.      . traMADol (ULTRAM) 50 MG tablet Take 1 tablet (50 mg total) by mouth every 6 (six) hours as needed.  15 tablet  0  . vitamin C (ASCORBIC ACID) 500 MG tablet Take 500 mg by mouth daily.      Marland Kitchen VITAMIN E PO Take 1 capsule by mouth daily.       No current facility-administered medications for this visit.    Past Medical History  Diagnosis Date  . Near syncope   . Anxiety and depression   . Hyperlipidemia   . Hypertension   . Hypothyroid   . Tobacco abuse     1/2 pack per day  . Chronic pain 11/22/2010  . Migraine headache 11/22/2010  . Gastroesophageal reflux disease 11/22/2010  . Pneumonia   . Bronchitis   . CHF (congestive heart failure)   . Anemia   . Myocardial infarction   . Peripheral vascular disease   . Cancer     uterus    Past Surgical History  Procedure Laterality Date  . Cholecystectomy  2007  . Abdominal hysterectomy      without oophorectomy for neoplastic  disease  . Nasal sinus surgery    . Dilation and curettage of uterus    . Lumbar laminectomy  x3    Left; complicated by neurologic dysfunction  . Laparotomy    . Carpal tunnel release      Left  . Cardiac catheterization  07/2013    normal coronary arteries  . Back surgery      History   Social History  . Marital Status: Married    Spouse Name: N/A    Number of Children: 2  . Years of Education: N/A   Occupational History  .      Disabled   Social History Main Topics  . Smoking status: Current Every Day Smoker -- 0.50 packs/day for 40 years    Types: Cigarettes    Start date: 09/06/1972  . Smokeless tobacco: Never Used  . Alcohol Use: No  . Drug Use: No  . Sexual Activity: Not Currently    Birth Control/ Protection: Surgical   Other Topics Concern  . Not on file   Social History Narrative  . No narrative on file    BP 155/77  Pulse 102 Weight 130 lb (58.968 kg) Height 5\' 2"  (1.575 m)    PHYSICAL EXAM General: NAD HEENT: Normal. Neck: No JVD, no thyromegaly. Lungs: Clear to auscultation bilaterally with normal respiratory effort. CV: Nondisplaced PMI.  Regular rate and rhythm, normal S1/S2, no S3/S4, no murmur.  Abdomen: Soft, no distention.  Neurologic: Alert and oriented x 3.  Psych: Normal affect. Skin: Normal. Musculoskeletal: Left foot in boot. Extremities: No clubbing or cyanosis.   ECG: Most recent ECG reviewed.    ASSESSMENT AND PLAN: 1. Chest pain: Related to anxiety per patient. Noncardiac. No further workup indicated. 2. PVD: Followed by [facility].  3. Elevated BP: Moderately elevated, and only becomes this way when she is in pain. Would recommend continued monitoring. 4. Foot injury: Recommended she f/u with ortho v podiatry. 5. Tobacco abuse: Encouraged her on cessation.  Dispo: f/u prn.   Kate Sable, M.D., F.A.C.C.

## 2013-12-02 NOTE — Patient Instructions (Signed)
Continue all current medications. Follow up as needed  

## 2013-12-04 NOTE — ED Provider Notes (Signed)
Medical screening examination/treatment/procedure(s) were performed by non-physician practitioner and as supervising physician I was immediately available for consultation/collaboration.   EKG Interpretation None        Maudry Diego, MD 12/04/13 1515

## 2013-12-04 NOTE — ED Provider Notes (Signed)
CSN: [id number]     Arrival date & time 12/01/13  1653 History   First MD Initiated Contact with Patient 12/01/13 1804     Chief Complaint  Patient presents with  . Ankle Pain     (Consider location/radiation/quality/duration/timing/severity/associated sxs/prior Treatment) HPI  [name] is a 58 y.o. female who presents to the Emergency Department complaining of pain to her left foot and ankle for 3 days.  Pain is worse with weight bearing.  She denies redness, swelling, numbness or pain proximal to the ankle.  No known injury.  She has taken her oxycodone without relief.    Past Medical History  Diagnosis Date  . Near syncope   . Anxiety and depression   . Hyperlipidemia   . Hypertension   . Hypothyroid   . Tobacco abuse     1/2 pack per day  . Chronic pain 11/22/2010  . Migraine headache 11/22/2010  . Gastroesophageal reflux disease 11/22/2010  . Pneumonia   . Bronchitis   . CHF (congestive heart failure)   . Anemia   . Myocardial infarction   . Peripheral vascular disease   . Cancer     uterus   Past Surgical History  Procedure Laterality Date  . Cholecystectomy  2007  . Abdominal hysterectomy      without oophorectomy for neoplastic disease  . Nasal sinus surgery    . Dilation and curettage of uterus    . Lumbar laminectomy  x3    Left; complicated by neurologic dysfunction  . Laparotomy    . Carpal tunnel release      Left  . Cardiac catheterization  07/2013    normal coronary arteries  . Back surgery     Family History  Problem Relation Age of Onset  . Colon polyps Mother   . Deep vein thrombosis Mother   . Heart disease Mother   . Hypertension Mother   . Bleeding Disorder Mother   . Hypertension Sister   . Heart disease Brother     before age 49  . Hypertension Brother    History  Substance Use Topics  . Smoking status: Current Every Day Smoker -- 0.50 packs/day for 40 years    Types: Cigarettes    Start date: 09/06/1972  . Smokeless  tobacco: Never Used  . Alcohol Use: No   OB History   Grav Para Term Preterm Abortions TAB SAB Ect Mult Living                 Review of Systems  Constitutional: Negative for fever and chills.  Genitourinary: Negative for dysuria and difficulty urinating.  Musculoskeletal: Positive for arthralgias. Negative for joint swelling.  Skin: Negative for color change and wound.  Neurological: Negative for weakness and numbness.  All other systems reviewed and are negative.     Allergies  Acetaminophen-codeine; Aspirin; Celecoxib; Levaquin; Lipitor; Sulfa antibiotics; Adhesive; and Latex  Home Medications   Prior to Admission medications   Medication Sig Start Date End Date Taking? Authorizing Provider  AFLURIA SUSP  10/19/13   Historical Provider, MD  albuterol (PROAIR HFA) 108 (90 BASE) MCG/ACT inhaler Inhale 2 puffs into the lungs every 6 (six) hours as needed for wheezing or shortness of breath.    Historical Provider, MD  albuterol (PROVENTIL) 2 MG tablet Take 2 mg by mouth 3 (three) times daily. For shortness of breath    Historical Provider, MD  ALPRAZolam Duanne Moron) 1 MG tablet Take 1 mg by mouth as needed.  Take 10 mg as needed.    Historical Provider, MD  aspirin EC 81 MG tablet Take 81 mg by mouth at bedtime.    Historical Provider, MD  BIOTIN PO Take 1 capsule by mouth daily.    Historical Provider, MD  busPIRone (BUSPAR) 15 MG tablet Take 15 mg by mouth 2 (two) times daily.    Historical Provider, MD  CHANTIX 0.5 MG tablet  11/26/13   Historical Provider, MD  Cholecalciferol (VITAMIN D PO) Take 1 tablet by mouth daily.    Historical Provider, MD  diphenhydrAMINE (BENADRYL) 25 MG tablet Take 25 mg by mouth at bedtime as needed for sleep.     Historical Provider, MD  DULoxetine (CYMBALTA) 60 MG capsule Take 120 mg by mouth daily.     Historical Provider, MD  EVENING PRIMROSE OIL PO Take 1 capsule by mouth daily.    Historical Provider, MD  fluticasone Asencion Islam) 50 MCG/ACT nasal  spray  11/26/13   Historical Provider, MD  hydrOXYzine (ATARAX/VISTARIL) 25 MG tablet Take 1-2 tablets (25-50 mg total) by mouth every 6 (six) hours as needed for itching. 03/29/13   Evalee Jefferson, PA-C  isosorbide mononitrate (IMDUR) 30 MG 24 hr tablet Take 1 tablet (30 mg total) by mouth daily. 05/25/13   Herminio Commons, MD  levothyroxine (LEVOTHROID) 25 MCG tablet Take 25 mcg by mouth every morning.     Historical Provider, MD  LORazepam (ATIVAN) 1 MG tablet Take 1 tablet (1 mg total) by mouth 2 (two) times daily as needed for anxiety. 07/15/13   Francine Graven, DO  methocarbamol (ROBAXIN) 500 MG tablet Take 500 mg by mouth every 8 (eight) hours as needed for muscle spasms.    Historical Provider, MD  NEXIUM 40 MG capsule Take 40 mg by mouth 2 (two) times daily before a meal.  12/23/11   Historical Provider, MD  oxycodone (ROXICODONE) 30 MG immediate release tablet Take 1 tablet (30 mg total) by mouth 2 (two) times daily as needed for pain. 05/25/13   Herminio Commons, MD  Potassium 99 MG TABS Take 99 mg by mouth daily.    Historical Provider, MD  tiZANidine (ZANAFLEX) 4 MG tablet Take 4 mg by mouth every 8 (eight) hours as needed for muscle spasms.    Historical Provider, MD  topiramate (TOPAMAX) 25 MG tablet  11/26/13   Historical Provider, MD  traMADol (ULTRAM) 50 MG tablet Take 1 tablet (50 mg total) by mouth every 6 (six) hours as needed. 12/01/13   Shimeka Bacot L. Matthan Sledge, PA-C  vitamin C (ASCORBIC ACID) 500 MG tablet Take 500 mg by mouth daily.    Historical Provider, MD  VITAMIN E PO Take 1 capsule by mouth daily.    Historical Provider, MD   BP 104/62  Pulse 86  Temp(Src) 98.5 F (36.9 C) (Oral)  Resp 20  Ht 5\' 2"  (1.575 m)  Wt 130 lb (58.968 kg)  BMI 23.77 kg/m2  SpO2 100% Physical Exam  Nursing note and vitals reviewed. Constitutional: She is oriented to person, place, and time. She appears well-developed and well-nourished. No distress.  Cardiovascular: Normal rate, regular  rhythm, normal heart sounds and intact distal pulses.   No murmur heard. Pulmonary/Chest: Effort normal and breath sounds normal. No respiratory distress.  Musculoskeletal: Normal range of motion. She exhibits tenderness.  ttp of the medial and lateral hindfoot.  No erythema or edema.  DP pulse and sensation intact.  No proximal tenderness.   Neurological: She is alert and oriented  to person, place, and time. She exhibits normal muscle tone. Coordination normal.  Skin: Skin is warm and dry.    ED Course  Procedures (including critical care time) Labs Review Labs Reviewed - No data to display  Imaging Review Dg Ankle Complete Left  12/01/2013   CLINICAL DATA:  Posterior and lateral ankle pain since Sunday. No known injury.  EXAM: LEFT ANKLE COMPLETE - 3+ VIEW  COMPARISON:  None.  FINDINGS: There is no evidence of fracture, dislocation, or joint effusion. Mild insertional changes to the dorsal and plantar calcaneus, without erosion. The Achilles shadow is difficult to clearly visualize, but appears continuous. No ankle degenerative changes.  IMPRESSION: 1. No acute findings. 2. Minimal calcaneus spurring.   Electronically Signed   By: Jorje Guild M.D.   On: 12/01/2013 17:26    EKG Interpretation None      MDM   Final diagnoses:  Ankle pain, left  Calcaneal spur of foot, left    Pt with tenderness to the left hindfoot and ankle.  No clinical signs of infection, no hx of gout.  Spurring noted on XR.  Pt agrees to close f/u with her PMD, RX for ultram.      Demarius Archila L. Cashis Rill, PA-C 12/04/13 1230

## 2013-12-22 ENCOUNTER — Emergency Department (HOSPITAL_COMMUNITY)
Admission: EM | Admit: 2013-12-22 | Discharge: 2013-12-22 | Disposition: A | Discharge status: Home / Self Care | Payer: 59 | Attending: Emergency Medicine | Admitting: Emergency Medicine

## 2013-12-22 ENCOUNTER — Encounter (HOSPITAL_COMMUNITY): Payer: Self-pay | Admitting: *Deleted

## 2013-12-22 DIAGNOSIS — Z760 Encounter for issue of repeat prescription: Secondary | ICD-10-CM | POA: Diagnosis not present

## 2013-12-22 DIAGNOSIS — K219 Gastro-esophageal reflux disease without esophagitis: Secondary | ICD-10-CM | POA: Diagnosis not present

## 2013-12-22 DIAGNOSIS — I252 Old myocardial infarction: Secondary | ICD-10-CM | POA: Insufficient documentation

## 2013-12-22 DIAGNOSIS — Z8701 Personal history of pneumonia (recurrent): Secondary | ICD-10-CM | POA: Insufficient documentation

## 2013-12-22 DIAGNOSIS — E785 Hyperlipidemia, unspecified: Secondary | ICD-10-CM | POA: Diagnosis not present

## 2013-12-22 DIAGNOSIS — Z7951 Long term (current) use of inhaled steroids: Secondary | ICD-10-CM | POA: Diagnosis not present

## 2013-12-22 DIAGNOSIS — G8929 Other chronic pain: Secondary | ICD-10-CM | POA: Diagnosis not present

## 2013-12-22 DIAGNOSIS — Z7982 Long term (current) use of aspirin: Secondary | ICD-10-CM | POA: Insufficient documentation

## 2013-12-22 DIAGNOSIS — G43909 Migraine, unspecified, not intractable, without status migrainosus: Secondary | ICD-10-CM | POA: Diagnosis not present

## 2013-12-22 DIAGNOSIS — Z720 Tobacco use: Secondary | ICD-10-CM | POA: Insufficient documentation

## 2013-12-22 DIAGNOSIS — Z79899 Other long term (current) drug therapy: Secondary | ICD-10-CM | POA: Insufficient documentation

## 2013-12-22 DIAGNOSIS — I10 Essential (primary) hypertension: Secondary | ICD-10-CM | POA: Insufficient documentation

## 2013-12-22 DIAGNOSIS — F329 Major depressive disorder, single episode, unspecified: Secondary | ICD-10-CM | POA: Insufficient documentation

## 2013-12-22 DIAGNOSIS — Z8589 Personal history of malignant neoplasm of other organs and systems: Secondary | ICD-10-CM | POA: Insufficient documentation

## 2013-12-22 DIAGNOSIS — F419 Anxiety disorder, unspecified: Secondary | ICD-10-CM

## 2013-12-22 DIAGNOSIS — I509 Heart failure, unspecified: Secondary | ICD-10-CM | POA: Diagnosis not present

## 2013-12-22 MED ORDER — LORAZEPAM 1 MG PO TABS
1.0000 mg | ORAL_TABLET | Freq: Once | ORAL | Status: AC
  Administered 2013-12-22: 1 mg via ORAL
  Filled 2013-12-22: qty 1

## 2013-12-22 MED ORDER — ALPRAZOLAM 1 MG PO TABS: 1.0000 mg | ORAL_TABLET | Freq: Three times a day (TID) | ORAL | Status: DC | PRN

## 2013-12-22 MED ORDER — DULOXETINE HCL 60 MG PO CPEP: 60.0000 mg | ORAL_CAPSULE | Freq: Two times a day (BID) | ORAL | Status: DC

## 2013-12-22 NOTE — ED Provider Notes (Signed)
TIME SEEN: 4:50 PM  CHIEF COMPLAINT: anxiety, medication refill  HPI: Pt is a 58 y.o. F with history of hypertension, hyperlipidemia, hypothyroidism, chronic pain who is followed at a pain management clinic, anxiety and depression who presents to the emergency department with complaints of panic attacks, anxiety and requesting medications to be refilled. She states that she recently had an argument with her primary care physician, Dr. Gerarda Fraction, after she was upset that her husband who recently had a stroke could not be seen for an appointment. She admits that she was very upset, loud, aggressive and angry towards the staff at his office. Because of this incident, patient reports that she was discharged from his practice. She has been trying to establish care with a primary care physician and has been seeing a psychologist Posey Boyer by tele psych.  Was last seen on 11/29/13 and has an appointment on 01/03/14. She reports that she has been out of her Cymbalta 60 mg twice a day and Xanax 1 mg 3 times daily as needed for the past 3 days. She states that she is very anxious, tremulous. Denies any pain, fever, vomiting or diarrhea, cough or shortness of breath. No SI or HI.  She is requesting that we help her establish care with a new PCP.  ROS: See HPI Constitutional: no fever  Eyes: no drainage  ENT: no runny nose   Cardiovascular:  no chest pain  Resp: no SOB  GI: no vomiting GU: no dysuria Integumentary: no rash  Allergy: no hives  Musculoskeletal: no leg swelling  Neurological: no slurred speech ROS otherwise negative  PAST MEDICAL HISTORY/PAST SURGICAL HISTORY:  Past Medical History  Diagnosis Date  . Near syncope   . Anxiety and depression   . Hyperlipidemia   . Hypertension   . Hypothyroid   . Tobacco abuse     1/2 pack per day  . Chronic pain 11/22/2010  . Migraine headache 11/22/2010  . Gastroesophageal reflux disease 11/22/2010  . Pneumonia   . Bronchitis   . CHF (congestive  heart failure)   . Anemia   . Myocardial infarction   . Peripheral vascular disease   . Cancer     uterus    MEDICATIONS:  Prior to Admission medications   Medication Sig Start Date End Date Taking? Authorizing Provider  AFLURIA SUSP  10/19/13   Historical Provider, MD  albuterol (PROAIR HFA) 108 (90 BASE) MCG/ACT inhaler Inhale 2 puffs into the lungs every 6 (six) hours as needed for wheezing or shortness of breath.    Historical Provider, MD  albuterol (PROVENTIL) 2 MG tablet Take 2 mg by mouth 3 (three) times daily. For shortness of breath    Historical Provider, MD  ALPRAZolam Duanne Moron) 1 MG tablet Take 1 mg by mouth as needed. Take 10 mg as needed.    Historical Provider, MD  aspirin EC 81 MG tablet Take 81 mg by mouth at bedtime.    Historical Provider, MD  BIOTIN PO Take 1 capsule by mouth daily.    Historical Provider, MD  busPIRone (BUSPAR) 15 MG tablet Take 15 mg by mouth 2 (two) times daily.    Historical Provider, MD  CHANTIX 0.5 MG tablet  11/26/13   Historical Provider, MD  Cholecalciferol (VITAMIN D PO) Take 1 tablet by mouth daily.    Historical Provider, MD  diphenhydrAMINE (BENADRYL) 25 MG tablet Take 25 mg by mouth at bedtime as needed for sleep.     Historical Provider, MD  DULoxetine (  CYMBALTA) 60 MG capsule Take 120 mg by mouth daily.     Historical Provider, MD  EVENING PRIMROSE OIL PO Take 1 capsule by mouth daily.    Historical Provider, MD  fluticasone Asencion Islam) 50 MCG/ACT nasal spray  11/26/13   Historical Provider, MD  hydrOXYzine (ATARAX/VISTARIL) 25 MG tablet Take 1-2 tablets (25-50 mg total) by mouth every 6 (six) hours as needed for itching. 03/29/13   Evalee Jefferson, PA-C  isosorbide mononitrate (IMDUR) 30 MG 24 hr tablet Take 1 tablet (30 mg total) by mouth daily. 05/25/13   Herminio Commons, MD  levothyroxine (LEVOTHROID) 25 MCG tablet Take 25 mcg by mouth every morning.     Historical Provider, MD  LORazepam (ATIVAN) 1 MG tablet Take 1 tablet (1 mg total) by  mouth 2 (two) times daily as needed for anxiety. 07/15/13   Francine Graven, DO  methocarbamol (ROBAXIN) 500 MG tablet Take 500 mg by mouth every 8 (eight) hours as needed for muscle spasms.    Historical Provider, MD  NEXIUM 40 MG capsule Take 40 mg by mouth 2 (two) times daily before a meal.  12/23/11   Historical Provider, MD  oxycodone (ROXICODONE) 30 MG immediate release tablet Take 1 tablet (30 mg total) by mouth 2 (two) times daily as needed for pain. 05/25/13   Herminio Commons, MD  Potassium 99 MG TABS Take 99 mg by mouth daily.    Historical Provider, MD  tiZANidine (ZANAFLEX) 4 MG tablet Take 4 mg by mouth every 8 (eight) hours as needed for muscle spasms.    Historical Provider, MD  topiramate (TOPAMAX) 25 MG tablet  11/26/13   Historical Provider, MD  traMADol (ULTRAM) 50 MG tablet Take 1 tablet (50 mg total) by mouth every 6 (six) hours as needed. 12/01/13   Tammy L. Triplett, PA-C  vitamin C (ASCORBIC ACID) 500 MG tablet Take 500 mg by mouth daily.    Historical Provider, MD  VITAMIN E PO Take 1 capsule by mouth daily.    Historical Provider, MD    ALLERGIES:  Allergies  Allergen Reactions  . Acetaminophen-Codeine Nausea And Vomiting  . Aspirin Nausea And Vomiting    (can take coated aspirin)  . Celecoxib Other (See Comments)    "does the opposite of what it is suppose to do"  . Levaquin [Levofloxacin In D5w] Itching  . Lipitor [Atorvastatin] Other (See Comments)    Myalgias   . Sulfa Antibiotics Nausea And Vomiting  . Adhesive [Tape] Rash  . Latex Rash    SOCIAL HISTORY:  History  Substance Use Topics  . Smoking status: Current Every Day Smoker -- 0.50 packs/day for 40 years    Types: Cigarettes    Start date: 09/06/1972  . Smokeless tobacco: Never Used  . Alcohol Use: No    FAMILY HISTORY: Family History  Problem Relation Age of Onset  . Colon polyps Mother   . Deep vein thrombosis Mother   . Heart disease Mother   . Hypertension Mother   . Bleeding  Disorder Mother   . Hypertension Sister   . Heart disease Brother     before age 23  . Hypertension Brother     EXAM: BP 142/98 mmHg  Pulse 118  Temp(Src) 98 F (36.7 C) (Oral)  Resp 17  Ht 5\' 2"  (1.575 m)  Wt 130 lb (58.968 kg)  BMI 23.77 kg/m2  SpO2 98% CONSTITUTIONAL: Alert and oriented and responds appropriately to questions. Well-appearing; well-nourished HEAD: Normocephalic EYES: Conjunctivae clear,  PERRL ENT: normal nose; no rhinorrhea; moist mucous membranes; pharynx without lesions noted NECK: Supple, no meningismus, no LAD  CARD: regular and tachycardic; S1 and S2 appreciated; no murmurs, no clicks, no rubs, no gallops RESP: Normal chest excursion without splinting or tachypnea; breath sounds clear and equal bilaterally; no wheezes, no rhonchi, no rales,  ABD/GI: Normal bowel sounds; non-distended; soft, non-tender, no rebound, no guarding BACK:  The back appears normal and is non-tender to palpation, there is no CVA tenderness EXT: Normal ROM in all joints; non-tender to palpation; no edema; normal capillary refill; no cyanosis    SKIN: Normal color for age and race; warm NEURO: Moves all extremities equally; patient is pacing in the room, appears mildly tremulous PSYCH: Patient appears very anxious, tremulous, upset, loud and pressured but organized speech. No SI or HI. No hallucinations.  MEDICAL DECISION MAKING: Pt here with significant anxiety. She states she is out of her Cymbalta and Xanax. Have discussed with patient that I can refill her Cymbalta but I can only give her a short supply of Xanax and she will need to follow-up with the PCP to have these medications refilled. Have given her outpatient resources for both [place] and [place]. She has no current psychiatric safety concerns and I do not feel needs an emergent psychiatric evaluation. She has no other complaints other than feeling anxious. Suspect that her heart rate will improve as her anxiety improves.  I feel she is safe to be discharged home with her family who is at bedside. Have discussed return precautions. She verbalizes understanding and is comfortable with this plan.       [place], DO 12/22/13 [phone]

## 2013-12-22 NOTE — ED Notes (Signed)
Pt states she is out of 60mg  cymbalta, and 1mg  Xanax for 3 days, states she feels very anxious. Unable to see a doctor per pt.

## 2013-12-22 NOTE — Discharge Instructions (Signed)
You may contact [place] and wellness to establish a primary care physician. There are also multiple [place], [place] and [facility] groups that are located in [place] and [place] that you can contact to establish a PCP.   Panic Attacks Panic attacks are sudden, short-livedsurges of severe anxiety, fear, or discomfort. They may occur for no reason when you are relaxed, when you are anxious, or when you are sleeping. Panic attacks may occur for a number of reasons:   Healthy people occasionally have panic attacks in extreme, life-threatening situations, such as war or natural disasters. Normal anxiety is a protective mechanism of the body that helps [place] react to danger (fight or flight response).  Panic attacks are often seen with anxiety disorders, such as panic disorder, social anxiety disorder, generalized anxiety disorder, and phobias. Anxiety disorders cause excessive or uncontrollable anxiety. They may interfere with your relationships or other life activities.  Panic attacks are sometimes seen with other mental illnesses, such as depression and posttraumatic stress disorder.  Certain medical conditions, prescription medicines, and drugs of abuse can cause panic attacks. SYMPTOMS  Panic attacks start suddenly, peak within 20 minutes, and are accompanied by four or more of the following symptoms:  Pounding heart or fast heart rate (palpitations).  Sweating.  Trembling or shaking.  Shortness of breath or feeling smothered.  Feeling choked.  Chest pain or discomfort.  Nausea or strange feeling in your stomach.  Dizziness, light-headedness, or feeling like you will faint.  Chills or hot flushes.  Numbness or tingling in your lips or hands and feet.  Feeling that things are not real or feeling that you are not yourself.  Fear of losing control or going crazy.  Fear of dying. Some of these symptoms can mimic serious medical conditions. For example, you  may think you are having a heart attack. Although panic attacks can be very scary, they are not life threatening. DIAGNOSIS  Panic attacks are diagnosed through an assessment by your health care provider. Your health care provider will ask questions about your symptoms, such as where and when they occurred. Your health care provider will also ask about your medical history and use of alcohol and drugs, including prescription medicines. Your health care provider may order blood tests or other studies to rule out a serious medical condition. Your health care provider may refer you to a mental health professional for further evaluation. TREATMENT   Most healthy people who have one or two panic attacks in an extreme, life-threatening situation will not require treatment.  The treatment for panic attacks associated with anxiety disorders or other mental illness typically involves counseling with a mental health professional, medicine, or a combination of both. Your health care provider will help determine what treatment is best for you.  Panic attacks due to physical illness usually go away with treatment of the illness. If prescription medicine is causing panic attacks, talk with your health care provider about stopping the medicine, decreasing the dose, or substituting another medicine.  Panic attacks due to alcohol or drug abuse go away with abstinence. Some adults need professional help in order to stop drinking or using drugs. HOME CARE INSTRUCTIONS   Take all medicines as directed by your health care provider.   Schedule and attend follow-up visits as directed by your health care provider. It is important to keep all your appointments. SEEK MEDICAL CARE IF:  You are not able to take your medicines as prescribed.  Your symptoms do not  improve or get worse. SEEK IMMEDIATE MEDICAL CARE IF:   You experience panic attack symptoms that are different than your usual symptoms.  You have serious  thoughts about hurting yourself or others.  You are taking medicine for panic attacks and have a serious side effect. MAKE SURE YOU:  Understand these instructions.  Will watch your condition.  Will get help right away if you are not doing well or get worse. Document Released: 01/21/2005 Document Revised: 01/26/2013 Document Reviewed: 09/04/2012 [facility] Patient Information 2015 [place], [place]. This information is not intended to replace advice given to you by your health care provider. Make sure you discuss any questions you have with your health care provider.  Medication Refill, Emergency Department We have refilled your medication today as a courtesy to you. It is best for your medical care, however, to take care of getting refills done through your primary caregiver's office. They have your records and can do a better job of follow-up than we can in the emergency department. On maintenance medications, we often only prescribe enough medications to get you by until you are able to see your regular caregiver. This is a more expensive way to refill medications. In the future, please plan for refills so that you will not have to use the emergency department for this. Thank you for your help. Your help allows [place] to better take care of the daily emergencies that enter our department. Document Released: 05/10/2003 Document Revised: 04/15/2011 Document Reviewed: 04/30/2013 [facility] Patient Information 2015 [place], [place]. This information is not intended to replace advice given to you by your health care provider. Make sure you discuss any questions you have with your health care provider.    Emergency Department Resource Guide 1) Find a Doctor and Pay Out of Pocket Although you won't have to find out who is covered by your insurance plan, it is a good idea to ask around and get recommendations. You will then need to call the office and see if the doctor you have chosen will accept you as a  new patient and what types of options they offer for patients who are self-pay. Some doctors offer discounts or will set up payment plans for their patients who do not have insurance, but you will need to ask so you aren't surprised when you get to your appointment.  2) Contact Your Local Health Department Not all health departments have doctors that can see patients for sick visits, but many do, so it is worth a call to see if yours does. If you don't know where your local health department is, you can check in your phone book. The CDC also has a tool to help you locate your state's health department, and many state websites also have listings of all of their local health departments.  3) Find a [facility] If your illness is not likely to be very severe or complicated, you may want to try a walk in clinic. These are popping up all over the country in pharmacies, drugstores, and shopping centers. They're usually staffed by nurse practitioners or physician assistants that have been trained to treat common illnesses and complaints. They're usually fairly quick and inexpensive. However, if you have serious medical issues or chronic medical problems, these are probably not your best option.  No Primary Care Doctor: - Call Health Connect at  [phone] - they can help you locate a primary care doctor that  accepts your insurance, provides certain services, etc. - Physician Referral Service- [phone]  Chronic Pain Problems: Organization         Address  Phone   Notes  [facility]  [phone] Patients need to be referred by their primary care doctor.   Medication Assistance: Organization         Address  Phone   Notes  [facility] Medication [facility] [address] [place], [place] [address] [phone] --Must be a resident of [facility] -- Must have NO insurance coverage whatsoever (no Medicaid/ Medicare, etc.) -- The pt. MUST have a  primary care doctor that directs their care regularly and follows them in the community   MedAssist  [phone]   [employer]  [phone]    Agencies that provide inexpensive medical care: Organization         Address  Phone   Notes  [place]  [phone]   [facility]    [phone]   [facility] [place], [place] [address] [phone]   [place] [address], [place] [phone]   Planned Parenthood    [phone]   [facility]    [phone]   [place] and [place] [address], [place] Phone:  [phone], Fax:  [phone] Hours of Operation:  9 am - 6 pm, M-F.  Also accepts Medicaid/Medicare and self-pay.  [facility] for [place], [address], [place] Phone: [phone], Fax: [phone]. Hours of Operation:  8:30 am - 5:30 pm, M-F.  Also accepts Medicaid and self-pay.  [facility] [place] [address] [place] Phone: [phone]   [place], [place], [place] [phone], Ext. [phone] Mondays & Thursdays: 7-9 AM.  First 15 patients are seen on a first come, first serve basis.    [place] Providers:  Organization         Address  Phone   Notes  [facility] [address], [place] [phone] Also accepts self-pay patients.  [facility] [address], [place]  [phone]   [place], [address], [place] [phone]   [facility] [address] [place] [phone]   Lucianne Lei [address], [place]   [phone] Only accepts [place] Access [place] patients after they have their name applied to their card.   Self-Pay (no insurance) in [facility]:  Organization         Address  Phone   Notes  Sickle Cell Patients, [facility] [place] [phone]   [facility] [place] [phone]   [facility] [place]  [place], [place], [place] [phone]   [facility]/Dr. Osei-Bonsu  [address] [place] or [address], [place] [phone] Phone number for both [place] and [place] locations is the same.  Urgent Medical and [facility] [address] [place] [phone]   [facility] [address] [place] or [address] [phone]   [facility] [address] [place] [phone], phone; [phone], fax Sees  patients 1st and 3rd Saturday of every month.  Must not qualify for public or private insurance (i.e. Medicaid, Medicare, [place] Health Choice, Veterans' Benefits)  Household income should be no more than 200% of the poverty level The clinic cannot treat you if you are pregnant or think you are pregnant  Sexually transmitted diseases are not treated at the clinic.    Dental Care: Organization         Address  Phone  Notes  [facility] Department of [facility] [place] [phone] Accepts children up to age 63 who are enrolled in [place] or [place]; pregnant women with a Medicaid card; and children who have applied for Medicaid or [place] Health Choice, but were declined, whose parents can pay a reduced fee at time of service.  [facility] Department of [facility]  [address], [place] [phone] Accepts children up to age 40 who are enrolled in [place] or [place]; pregnant women with a Medicaid card; and children who have applied for Medicaid or  Health Choice, but were declined, whose parents can  pay a reduced fee at time of service.  [place] Adult Dental Access PROGRAM  [place] [phone] Patients are seen by appointment only. Walk-ins are not accepted. [place] will see patients 22 years of age and older. Monday - Tuesday (8am-5pm) Most Wednesdays (8:30-5pm) $30 per visit, cash only  [facility] Adult Dental Access PROGRAM  [address], [facility] [phone] Patients are seen by appointment only. Walk-ins are not accepted. [place] will see patients 55 years of age and older. One Wednesday Evening (Monthly: Volunteer Based).  $30 per visit, cash only  [place]  [phone] for adults; Children under age 69, call Graduate Pediatric Dentistry at [phone]. Children aged 48-14, please call [phone] to request a pediatric application.  Dental services are provided in all areas of dental care including fillings, crowns and bridges, complete and partial dentures, implants, gum treatment, root canals, and extractions. Preventive care is also provided. Treatment is provided to both adults and children. Patients are selected via a lottery and there is often a waiting list.   [facility] [address], [place]  [phone] [email or web address]   [facility] [address] [place], [place] [phone], Ext. [phone] Second and Fourth Thursday of each month, opens at 6:30 AM; Clinic ends at 9 AM.  Patients are seen on a first-come first-served basis, and a limited number are seen during each clinic.   [facility]  [address] [place], [place] [phone]   Eligibility Requirements You must have lived in [place], [place], or [place] counties for at least the last three months.   You cannot be eligible for state or federal sponsored [employer], including [employer], [place], or [employer].   You generally cannot be eligible for healthcare  insurance through your employer.    How to apply: Eligibility screenings are held every Tuesday and Wednesday afternoon from 1:00 pm until 4:00 pm. You do not need an appointment for the interview!  [facility] [address] [place], [place]   [place]  [place] Department  [place]  [phone]    Behavioral Health Resources in the Community: Intensive Outpatient Programs  Organization         Address  Phone  Notes  [employer] [facility] [address], [place], [place] [phone]   [facility]patient [address], [place], [place]   ADS: Alcohol [facility] Drug S[facility]cs [address] [place], [place]   [place] [address],  [place], [place] or [phone]   Substance Abuse Resources Organization         Address  Phone  Notes  Alcohol and Drug Services  [phone]   [place]  [phone]   [facility] [place]   Chinita Pester  [phone]   Residential & Outpatient Substance Abuse Program  [phone]   Psychological Services Organization         Address  Phone  Notes  [facility] [place]  [place]  [phone]   [place] [address] [place] or [phone]    Mobile Crisis Teams Organization         Address  Phone  Notes  [facility], Mobile Crisis Care Unit  [phone]   [facility] Psychotherapeutic [facility]  [address] [place], [place]   [place] [address], [place] [phone]    Self-Help/Support Groups Organization         Address  Phone             Notes  [place]. of [place] - variety of support groups  [place] Call for more information  Narcotics Anonymous (NA),  [facility] [address], [employer] [place]  2 meetings at this location   Special educational needs teacher         Address  Phone  Notes  [facility] Residential Treatment [place],    [place]  [phone]-(734)[phone]   [facility]  [address] [place] [phone], [place], [place]   [place], [place] [phone] Admissions: 8am-3pm M-F  Incentives Substance [place] [address]    [place], [place] [phone]   [facility] [address] [place], [place], [place]   [facility] [address]  [place], [place]   [facility] Programs - Intensive Outpatient [address] Kristeen [address], [place], [place]   [facility] [place] ([place].,  [place], [place] [phone] or [phone]   [facility] (RTS) [address], [place], [place] Accepts Medicaid  [facility] [place] [address]  [place] [phone])[phone] Substance Abuse/Addiction Treatment   [facility] Organization         Address  Phone  Notes  [facility]  [phone]   Domenic Schwab, PhD [address] [place], [place]   [phone] or [phone]   [place], [place] [phone]   [place] [address] [place], [place] [phone] Insurance/Medicaid/sponsorship through [facility] and Families [address] [place]                                    [place], [place] [phone] [place] [address], [place] [phone]    Dr. Adele Schilder  [phone]   [facility] Dept. 1) [address] [place] 2) [place] 3)  [place] [address], [place] [phone]  [phone]    [place] [phone] or [phone] (After Hours)

## 2013-12-22 NOTE — ED Notes (Signed)
Patient refusing to change into gown or sit on stretcher.

## 2013-12-22 NOTE — ED Notes (Signed)
Patient with no complaints at this time. Respirations even and unlabored. Skin warm/dry. Discharge instructions reviewed with patient at this time. Patient given opportunity to voice concerns/ask questions. Patient discharged at this time and left Emergency Department with steady gait.   

## 2014-01-13 ENCOUNTER — Encounter (HOSPITAL_COMMUNITY): Payer: Self-pay | Admitting: Cardiology

## 2014-03-09 ENCOUNTER — Other Ambulatory Visit (HOSPITAL_COMMUNITY): Payer: Self-pay | Admitting: Nurse Practitioner

## 2014-03-09 DIAGNOSIS — Z1231 Encounter for screening mammogram for malignant neoplasm of breast: Secondary | ICD-10-CM

## 2014-03-14 ENCOUNTER — Ambulatory Visit (HOSPITAL_COMMUNITY): Payer: Medicaid Other

## 2014-03-17 ENCOUNTER — Inpatient Hospital Stay (HOSPITAL_COMMUNITY)
Admission: EM | Admit: 2014-03-17 | Discharge: 2014-03-22 | DRG: 871 | Disposition: A | Discharge status: Home Health | Payer: Medicare Other | Attending: Internal Medicine | Admitting: Internal Medicine

## 2014-03-17 ENCOUNTER — Encounter (HOSPITAL_COMMUNITY): Payer: Self-pay | Admitting: Emergency Medicine

## 2014-03-17 ENCOUNTER — Emergency Department (HOSPITAL_COMMUNITY): Payer: Medicare Other

## 2014-03-17 DIAGNOSIS — E871 Hypo-osmolality and hyponatremia: Secondary | ICD-10-CM | POA: Diagnosis present

## 2014-03-17 DIAGNOSIS — Z7982 Long term (current) use of aspirin: Secondary | ICD-10-CM

## 2014-03-17 DIAGNOSIS — J441 Chronic obstructive pulmonary disease with (acute) exacerbation: Secondary | ICD-10-CM | POA: Diagnosis present

## 2014-03-17 DIAGNOSIS — I10 Essential (primary) hypertension: Secondary | ICD-10-CM | POA: Diagnosis present

## 2014-03-17 DIAGNOSIS — E039 Hypothyroidism, unspecified: Secondary | ICD-10-CM | POA: Diagnosis present

## 2014-03-17 DIAGNOSIS — E860 Dehydration: Secondary | ICD-10-CM | POA: Diagnosis present

## 2014-03-17 DIAGNOSIS — G8929 Other chronic pain: Secondary | ICD-10-CM | POA: Diagnosis present

## 2014-03-17 DIAGNOSIS — I959 Hypotension, unspecified: Secondary | ICD-10-CM

## 2014-03-17 DIAGNOSIS — N179 Acute kidney failure, unspecified: Secondary | ICD-10-CM | POA: Diagnosis present

## 2014-03-17 DIAGNOSIS — R059 Cough, unspecified: Secondary | ICD-10-CM

## 2014-03-17 DIAGNOSIS — R05 Cough: Secondary | ICD-10-CM

## 2014-03-17 DIAGNOSIS — D649 Anemia, unspecified: Secondary | ICD-10-CM | POA: Diagnosis present

## 2014-03-17 DIAGNOSIS — E785 Hyperlipidemia, unspecified: Secondary | ICD-10-CM | POA: Diagnosis present

## 2014-03-17 DIAGNOSIS — J189 Pneumonia, unspecified organism: Secondary | ICD-10-CM | POA: Diagnosis present

## 2014-03-17 DIAGNOSIS — F419 Anxiety disorder, unspecified: Secondary | ICD-10-CM | POA: Diagnosis present

## 2014-03-17 DIAGNOSIS — A047 Enterocolitis due to Clostridium difficile: Secondary | ICD-10-CM | POA: Diagnosis present

## 2014-03-17 DIAGNOSIS — Z720 Tobacco use: Secondary | ICD-10-CM

## 2014-03-17 DIAGNOSIS — Z79891 Long term (current) use of opiate analgesic: Secondary | ICD-10-CM

## 2014-03-17 DIAGNOSIS — J9601 Acute respiratory failure with hypoxia: Secondary | ICD-10-CM | POA: Diagnosis present

## 2014-03-17 DIAGNOSIS — Z8542 Personal history of malignant neoplasm of other parts of uterus: Secondary | ICD-10-CM

## 2014-03-17 DIAGNOSIS — I252 Old myocardial infarction: Secondary | ICD-10-CM

## 2014-03-17 DIAGNOSIS — F17210 Nicotine dependence, cigarettes, uncomplicated: Secondary | ICD-10-CM | POA: Diagnosis present

## 2014-03-17 DIAGNOSIS — Z7951 Long term (current) use of inhaled steroids: Secondary | ICD-10-CM

## 2014-03-17 DIAGNOSIS — K219 Gastro-esophageal reflux disease without esophagitis: Secondary | ICD-10-CM | POA: Diagnosis present

## 2014-03-17 DIAGNOSIS — F329 Major depressive disorder, single episode, unspecified: Secondary | ICD-10-CM | POA: Diagnosis present

## 2014-03-17 DIAGNOSIS — Z8249 Family history of ischemic heart disease and other diseases of the circulatory system: Secondary | ICD-10-CM

## 2014-03-17 DIAGNOSIS — R0902 Hypoxemia: Secondary | ICD-10-CM

## 2014-03-17 DIAGNOSIS — E876 Hypokalemia: Secondary | ICD-10-CM | POA: Diagnosis present

## 2014-03-17 DIAGNOSIS — A419 Sepsis, unspecified organism: Secondary | ICD-10-CM | POA: Diagnosis present

## 2014-03-17 DIAGNOSIS — I509 Heart failure, unspecified: Secondary | ICD-10-CM | POA: Diagnosis present

## 2014-03-17 LAB — COMPREHENSIVE METABOLIC PANEL
ALT: 22 U/L (ref 0–35)
AST: 48 U/L — AB (ref 0–37)
Albumin: 3.5 g/dL (ref 3.5–5.2)
Alkaline Phosphatase: 60 U/L (ref 39–117)
Anion gap: 10 (ref 5–15)
BUN: 16 mg/dL (ref 6–23)
CHLORIDE: 96 mmol/L (ref 96–112)
CO2: 24 mmol/L (ref 19–32)
Calcium: 8.6 mg/dL (ref 8.4–10.5)
Creatinine, Ser: 1.6 mg/dL — ABNORMAL HIGH (ref 0.50–1.10)
GFR calc Af Amer: 40 mL/min — ABNORMAL LOW (ref >90)
GFR calc non Af Amer: 35 mL/min — ABNORMAL LOW (ref >90)
Glucose, Bld: 140 mg/dL — ABNORMAL HIGH (ref 70–99)
POTASSIUM: 3.2 mmol/L — AB (ref 3.5–5.1)
SODIUM: 130 mmol/L — AB (ref 135–145)
Total Bilirubin: 0.5 mg/dL (ref 0.3–1.2)
Total Protein: 7.2 g/dL (ref 6.0–8.3)

## 2014-03-17 LAB — CBC WITH DIFFERENTIAL/PLATELET
BASOS ABS: 0 10*3/uL (ref 0.0–0.1)
Basophils Relative: 0 % (ref 0–1)
EOS PCT: 0 % (ref 0–5)
Eosinophils Absolute: 0 10*3/uL (ref 0.0–0.7)
HEMATOCRIT: 38.1 % (ref 36.0–46.0)
HEMOGLOBIN: 13.1 g/dL (ref 12.0–15.0)
Lymphocytes Relative: 16 % (ref 12–46)
Lymphs Abs: 1.5 10*3/uL (ref 0.7–4.0)
MCH: 31.7 pg (ref 26.0–34.0)
MCHC: 34.4 g/dL (ref 30.0–36.0)
MCV: 92.3 fL (ref 78.0–100.0)
Monocytes Absolute: 0.5 10*3/uL (ref 0.1–1.0)
Monocytes Relative: 6 % (ref 3–12)
Neutro Abs: 7.3 10*3/uL (ref 1.7–7.7)
Neutrophils Relative %: 78 % — ABNORMAL HIGH (ref 43–77)
Platelets: 199 10*3/uL (ref 150–400)
RBC: 4.13 MIL/uL (ref 3.87–5.11)
RDW: 12.9 % (ref 11.5–15.5)
WBC: 9.4 10*3/uL (ref 4.0–10.5)

## 2014-03-17 LAB — I-STAT CG4 LACTIC ACID, ED
LACTIC ACID, VENOUS: 1.54 mmol/L (ref 0.5–2.0)
Lactic Acid, Venous: 2.57 mmol/L (ref 0.5–2.0)

## 2014-03-17 LAB — BRAIN NATRIURETIC PEPTIDE: B Natriuretic Peptide: 66 pg/mL (ref 0.0–100.0)

## 2014-03-17 MED ORDER — POTASSIUM CHLORIDE CRYS ER 20 MEQ PO TBCR
40.0000 meq | EXTENDED_RELEASE_TABLET | Freq: Once | ORAL | Status: AC
  Administered 2014-03-17: 40 meq via ORAL
  Filled 2014-03-17: qty 2

## 2014-03-17 MED ORDER — PANTOPRAZOLE SODIUM 40 MG PO TBEC
80.0000 mg | DELAYED_RELEASE_TABLET | Freq: Every day | ORAL | Status: DC
  Administered 2014-03-17 – 2014-03-22 (×6): 80 mg via ORAL
  Filled 2014-03-17 (×6): qty 2

## 2014-03-17 MED ORDER — ASPIRIN EC 81 MG PO TBEC
81.0000 mg | DELAYED_RELEASE_TABLET | Freq: Every day | ORAL | Status: DC
  Administered 2014-03-18 – 2014-03-21 (×4): 81 mg via ORAL
  Filled 2014-03-17 (×5): qty 1

## 2014-03-17 MED ORDER — SODIUM CHLORIDE 0.9 % IV BOLUS (SEPSIS)
1000.0000 mL | Freq: Once | INTRAVENOUS | Status: AC
  Administered 2014-03-17: 1000 mL via INTRAVENOUS

## 2014-03-17 MED ORDER — DEXTROSE 5 % IV SOLN
1.0000 g | INTRAVENOUS | Status: DC
  Filled 2014-03-17 (×3): qty 1

## 2014-03-17 MED ORDER — IPRATROPIUM BROMIDE 0.02 % IN SOLN
0.5000 mg | Freq: Four times a day (QID) | RESPIRATORY_TRACT | Status: DC
  Administered 2014-03-17: 0.5 mg via RESPIRATORY_TRACT
  Filled 2014-03-17 (×2): qty 2.5

## 2014-03-17 MED ORDER — ALBUTEROL (5 MG/ML) CONTINUOUS INHALATION SOLN
15.0000 mg/h | INHALATION_SOLUTION | RESPIRATORY_TRACT | Status: DC
  Administered 2014-03-17: 15 mg/h via RESPIRATORY_TRACT
  Filled 2014-03-17: qty 20

## 2014-03-17 MED ORDER — POTASSIUM CHLORIDE 10 MEQ/100ML IV SOLN
10.0000 meq | INTRAVENOUS | Status: AC
  Administered 2014-03-17 – 2014-03-18 (×3): 10 meq via INTRAVENOUS
  Filled 2014-03-17 (×3): qty 100

## 2014-03-17 MED ORDER — TRAMADOL HCL 50 MG PO TABS: 50.0000 mg | ORAL_TABLET | Freq: Four times a day (QID) | ORAL | Status: DC | PRN

## 2014-03-17 MED ORDER — BUSPIRONE HCL 5 MG PO TABS
15.0000 mg | ORAL_TABLET | Freq: Two times a day (BID) | ORAL | Status: DC
Start: 2014-03-17 — End: 2014-03-22
  Administered 2014-03-17 – 2014-03-22 (×10): 15 mg via ORAL
  Filled 2014-03-17 (×10): qty 3

## 2014-03-17 MED ORDER — ONDANSETRON HCL 4 MG/2ML IJ SOLN: 4.0000 mg | Freq: Four times a day (QID) | INTRAMUSCULAR | Status: DC | PRN

## 2014-03-17 MED ORDER — DEXTROSE 5 % IV SOLN
500.0000 mg | Freq: Once | INTRAVENOUS | Status: AC
  Administered 2014-03-17: 500 mg via INTRAVENOUS
  Filled 2014-03-17: qty 500

## 2014-03-17 MED ORDER — ENOXAPARIN SODIUM 40 MG/0.4ML ~~LOC~~ SOLN
40.0000 mg | SUBCUTANEOUS | Status: DC
  Administered 2014-03-17 – 2014-03-19 (×3): 40 mg via SUBCUTANEOUS
  Filled 2014-03-17 (×3): qty 0.4

## 2014-03-17 MED ORDER — METHYLPREDNISOLONE SODIUM SUCC 125 MG IJ SOLR
125.0000 mg | Freq: Once | INTRAMUSCULAR | Status: AC
  Administered 2014-03-17: 125 mg via INTRAVENOUS
  Filled 2014-03-17: qty 2

## 2014-03-17 MED ORDER — LEVOTHYROXINE SODIUM 25 MCG PO TABS
25.0000 ug | ORAL_TABLET | Freq: Every day | ORAL | Status: DC
  Administered 2014-03-18 – 2014-03-22 (×5): 25 ug via ORAL
  Filled 2014-03-17 (×5): qty 1

## 2014-03-17 MED ORDER — SODIUM CHLORIDE 0.9 % IV BOLUS (SEPSIS)
1000.0000 mL | Freq: Once | INTRAVENOUS | Status: DC
  Administered 2014-03-17: 1000 mL via INTRAVENOUS

## 2014-03-17 MED ORDER — DULOXETINE HCL 60 MG PO CPEP
60.0000 mg | ORAL_CAPSULE | Freq: Two times a day (BID) | ORAL | Status: DC
Start: 2014-03-17 — End: 2014-03-22
  Administered 2014-03-17 – 2014-03-22 (×10): 60 mg via ORAL
  Filled 2014-03-17 (×10): qty 1

## 2014-03-17 MED ORDER — DEXTROSE 5 % IV SOLN
1.0000 g | INTRAVENOUS | Status: DC
  Filled 2014-03-17: qty 1

## 2014-03-17 MED ORDER — ALPRAZOLAM 1 MG PO TABS
1.0000 mg | ORAL_TABLET | Freq: Three times a day (TID) | ORAL | Status: DC | PRN
  Administered 2014-03-17 – 2014-03-22 (×11): 1 mg via ORAL
  Filled 2014-03-17: qty 2
  Filled 2014-03-17 (×2): qty 1
  Filled 2014-03-17: qty 2
  Filled 2014-03-17 (×2): qty 1
  Filled 2014-03-17: qty 2
  Filled 2014-03-17: qty 1
  Filled 2014-03-17: qty 2
  Filled 2014-03-17: qty 1
  Filled 2014-03-17: qty 2

## 2014-03-17 MED ORDER — TIZANIDINE HCL 4 MG PO TABS
4.0000 mg | ORAL_TABLET | Freq: Three times a day (TID) | ORAL | Status: DC | PRN
  Filled 2014-03-17: qty 1

## 2014-03-17 MED ORDER — SODIUM CHLORIDE 0.9 % IV SOLN
INTRAVENOUS | Status: DC
  Administered 2014-03-18 – 2014-03-19 (×4): via INTRAVENOUS

## 2014-03-17 MED ORDER — VANCOMYCIN HCL IN DEXTROSE 1-5 GM/200ML-% IV SOLN
1000.0000 mg | Freq: Once | INTRAVENOUS | Status: AC
  Administered 2014-03-17: 1000 mg via INTRAVENOUS
  Filled 2014-03-17: qty 200

## 2014-03-17 MED ORDER — DEXTROSE 5 % IV SOLN
500.0000 mg | INTRAVENOUS | Status: DC
  Administered 2014-03-18 – 2014-03-19 (×2): 500 mg via INTRAVENOUS
  Filled 2014-03-17 (×3): qty 500

## 2014-03-17 MED ORDER — LEVALBUTEROL HCL 0.63 MG/3ML IN NEBU
0.6300 mg | INHALATION_SOLUTION | Freq: Four times a day (QID) | RESPIRATORY_TRACT | Status: DC
  Administered 2014-03-17 – 2014-03-18 (×2): 0.63 mg via RESPIRATORY_TRACT
  Filled 2014-03-17 (×2): qty 3

## 2014-03-17 MED ORDER — VANCOMYCIN HCL IN DEXTROSE 1-5 GM/200ML-% IV SOLN
1000.0000 mg | INTRAVENOUS | Status: DC
  Filled 2014-03-17 (×3): qty 200

## 2014-03-17 MED ORDER — ONDANSETRON HCL 4 MG PO TABS: 4.0000 mg | ORAL_TABLET | Freq: Four times a day (QID) | ORAL | Status: DC | PRN

## 2014-03-17 MED ORDER — METHYLPREDNISOLONE SODIUM SUCC 125 MG IJ SOLR
60.0000 mg | Freq: Four times a day (QID) | INTRAMUSCULAR | Status: DC
  Administered 2014-03-17 – 2014-03-19 (×7): 60 mg via INTRAVENOUS
  Filled 2014-03-17 (×7): qty 2

## 2014-03-17 MED ORDER — LEVALBUTEROL HCL 0.63 MG/3ML IN NEBU
0.6300 mg | INHALATION_SOLUTION | Freq: Four times a day (QID) | RESPIRATORY_TRACT | Status: DC | PRN
  Filled 2014-03-17: qty 3

## 2014-03-17 MED ORDER — DEXTROSE 5 % IV SOLN
2.0000 g | Freq: Once | INTRAVENOUS | Status: AC
  Administered 2014-03-17: 2 g via INTRAVENOUS
  Filled 2014-03-17: qty 2

## 2014-03-17 MED ORDER — IOHEXOL 350 MG/ML SOLN
80.0000 mL | Freq: Once | INTRAVENOUS | Status: AC | PRN
  Administered 2014-03-17: 80 mL via INTRAVENOUS

## 2014-03-17 NOTE — ED Notes (Signed)
Report called to [facility] ICU all questions answered.

## 2014-03-17 NOTE — ED Notes (Addendum)
PT states she was sent from Urgent Care for pneumonia on chest xray. PT c/o non-productive cough and generalized weakness x5 days. PT c/o fever last night at home of 103.0. PT denies any OTC medications for fever today. PT stated she was given an albuterol breathing tx prior to ED arrival.

## 2014-03-17 NOTE — ED Notes (Signed)
CRITICAL VALUE ALERT  Critical value received:  Lactic acid 2.57  Date of notification:  03/17/2014  Time of notification:  1930  Critical value read back:Yes.    Nurse who received alert:  Starlena Beil  MD notified (1st page):  ward   Time MD responded:  [phone]

## 2014-03-17 NOTE — ED Provider Notes (Addendum)
TIME SEEN: 3:50 PM  CHIEF COMPLAINT: Fever, cough, shortness of breath  HPI: Pt is a 59 y.o. female with history of hypertension, hyperlipidemia, hypothyroidism, anxiety and depression, COPD with continued tobacco abuse not on home oxygen who presents to the emergency department with fevers between 102 and 103, productive cough with yellow/green sputum, general as weakness, shortness of breath with exertion for the past 5 days. No chest pain or chest discomfort. Was seen at urgent care earlier today and reportedly was found to have a pneumonia on chest x-ray. States she was given a "pneumonia shot" and sent to the emergency department for evaluation. Denies history of CAD, CHF, PE or DVT.  Denies nausea or vomiting but has had intermittent diarrhea. No recent travel or hospitalization. She states her granddaughter had similar symptoms a week ago.   PCP - [facility]  Pt is a FULL code.   ROS: See HPI Constitutional:  fever  Eyes: no drainage  ENT: no runny nose   Cardiovascular:  no chest pain  Resp: SOB  GI: no vomiting GU: no dysuria Integumentary: no rash  Allergy: no hives  Musculoskeletal: no leg swelling  Neurological: no slurred speech ROS otherwise negative  PAST MEDICAL HISTORY/PAST SURGICAL HISTORY:  Past Medical History  Diagnosis Date  . Near syncope   . Anxiety and depression   . Hyperlipidemia   . Hypertension   . Hypothyroid   . Tobacco abuse     1/2 pack per day  . Chronic pain 11/22/2010  . Migraine headache 11/22/2010  . Gastroesophageal reflux disease 11/22/2010  . Pneumonia   . Bronchitis   . CHF (congestive heart failure)   . Anemia   . Myocardial infarction   . Peripheral vascular disease   . Cancer     uterus    MEDICATIONS:  Prior to Admission medications   Medication Sig Start Date End Date Taking? Authorizing Provider  AFLURIA SUSP  10/19/13   Historical Provider, MD  albuterol (PROAIR HFA) 108 (90 BASE) MCG/ACT inhaler  Inhale 2 puffs into the lungs every 6 (six) hours as needed for wheezing or shortness of breath.    Historical Provider, MD  albuterol (PROVENTIL) 2 MG tablet Take 2 mg by mouth 3 (three) times daily. For shortness of breath    Historical Provider, MD  ALPRAZolam (XANAX) 1 MG tablet Take 1 tablet (1 mg total) by mouth 3 (three) times daily as needed for anxiety. 12/22/13   Bradley Handyside N Meka Lewan, DO  aspirin EC 81 MG tablet Take 81 mg by mouth at bedtime.    Historical Provider, MD  BIOTIN PO Take 1 capsule by mouth daily.    Historical Provider, MD  busPIRone (BUSPAR) 15 MG tablet Take 15 mg by mouth 2 (two) times daily.    Historical Provider, MD  CHANTIX 0.5 MG tablet  11/26/13   Historical Provider, MD  Cholecalciferol (VITAMIN D PO) Take 1 tablet by mouth daily.    Historical Provider, MD  diphenhydrAMINE (BENADRYL) 25 MG tablet Take 25 mg by mouth at bedtime as needed for sleep.     Historical Provider, MD  DULoxetine (CYMBALTA) 60 MG capsule Take 120 mg by mouth daily.     Historical Provider, MD  DULoxetine (CYMBALTA) 60 MG capsule Take 1 capsule (60 mg total) by mouth 2 (two) times daily. 12/22/13   Samone Guhl N Yuna Pizzolato, DO  EVENING PRIMROSE OIL PO Take 1 capsule by mouth daily.    Historical Provider, MD  fluticasone Asencion Islam)  50 MCG/ACT nasal spray  11/26/13   Historical Provider, MD  hydrOXYzine (ATARAX/VISTARIL) 25 MG tablet Take 1-2 tablets (25-50 mg total) by mouth every 6 (six) hours as needed for itching. 03/29/13   Evalee Jefferson, PA-C  isosorbide mononitrate (IMDUR) 30 MG 24 hr tablet Take 1 tablet (30 mg total) by mouth daily. 05/25/13   Herminio Commons, MD  levothyroxine (LEVOTHROID) 25 MCG tablet Take 25 mcg by mouth every morning.     Historical Provider, MD  LORazepam (ATIVAN) 1 MG tablet Take 1 tablet (1 mg total) by mouth 2 (two) times daily as needed for anxiety. 07/15/13   Francine Graven, DO  methocarbamol (ROBAXIN) 500 MG tablet Take 500 mg by mouth every 8 (eight) hours as needed for  muscle spasms.    Historical Provider, MD  NEXIUM 40 MG capsule Take 40 mg by mouth 2 (two) times daily before a meal.  12/23/11   Historical Provider, MD  oxycodone (ROXICODONE) 30 MG immediate release tablet Take 1 tablet (30 mg total) by mouth 2 (two) times daily as needed for pain. 05/25/13   Herminio Commons, MD  Potassium 99 MG TABS Take 99 mg by mouth daily.    Historical Provider, MD  tiZANidine (ZANAFLEX) 4 MG tablet Take 4 mg by mouth every 8 (eight) hours as needed for muscle spasms.    Historical Provider, MD  topiramate (TOPAMAX) 25 MG tablet  11/26/13   Historical Provider, MD  traMADol (ULTRAM) 50 MG tablet Take 1 tablet (50 mg total) by mouth every 6 (six) hours as needed. 12/01/13   Tammy L. Triplett, PA-C  vitamin C (ASCORBIC ACID) 500 MG tablet Take 500 mg by mouth daily.    Historical Provider, MD  VITAMIN E PO Take 1 capsule by mouth daily.    Historical Provider, MD    ALLERGIES:  Allergies  Allergen Reactions  . Acetaminophen-Codeine Nausea And Vomiting  . Aspirin Nausea And Vomiting    (can take coated aspirin)  . Celecoxib Other (See Comments)    "does the opposite of what it is suppose to do"  . Levaquin [Levofloxacin In D5w] Itching  . Lipitor [Atorvastatin] Other (See Comments)    Myalgias   . Sulfa Antibiotics Nausea And Vomiting  . Adhesive [Tape] Rash  . Latex Rash    SOCIAL HISTORY:  History  Substance Use Topics  . Smoking status: Current Every Day Smoker -- 0.50 packs/day for 40 years    Types: Cigarettes    Start date: 09/06/1972  . Smokeless tobacco: Never Used  . Alcohol Use: No    FAMILY HISTORY: Family History  Problem Relation Age of Onset  . Colon polyps Mother   . Deep vein thrombosis Mother   . Heart disease Mother   . Hypertension Mother   . Bleeding Disorder Mother   . Hypertension Sister   . Heart disease Brother     before age 21  . Hypertension Brother     EXAM: BP 94/70 mmHg  Pulse 99  Temp(Src) 97.7 F (36.5 C)  (Oral)  Resp 22  Ht 5\' 2"  (1.575 m)  Wt 130 lb (58.968 kg)  BMI 23.77 kg/m2  SpO2 95% CONSTITUTIONAL: Alert and oriented and responds appropriately to questions. Well-appearing; well-nourished, nontoxic but appears chronically ill and older than stated age HEAD: Normocephalic EYES: Conjunctivae clear, PERRL ENT: normal nose; no rhinorrhea; moist mucous membranes; pharynx without lesions noted NECK: Supple, no meningismus, no LAD  CARD: RRR; S1 and S2 appreciated; no  murmurs, no clicks, no rubs, no gallops RESP: Normal chest excursion without splinting or tachypnea; breath sounds equal bilaterally, no rhonchi, no rales, no hypoxia but patient does become tachypneic and speak short sentences after walking around the bed, diffuse expiratory wheezing, diminished at bases bilaterally ABD/GI: Normal bowel sounds; non-distended; soft, non-tender, no rebound, no guarding BACK:  The back appears normal and is non-tender to palpation, there is no CVA tenderness EXT: Normal ROM in all joints; non-tender to palpation; no edema; normal capillary refill; no cyanosis, no calf tenderness or swelling    SKIN: Normal color for age and race; warm NEURO: Moves all extremities equally PSYCH: The patient's mood and manner are appropriate. Grooming and personal hygiene are appropriate.  MEDICAL DECISION MAKING: Patient here with COPD exacerbation and possible pneumonia. We'll repeat chest x-ray in the emergency department, obtain labs, lactate, blood cultures. We'll give continuous albuterol, Solu-Medrol for her COPD exacerbation. Patient is mildly hypotensive, BP 94/70, but she is a very small patient and appear she has had blood pressures this low in the past.. We'll give IV fluids gently as she has a documented history of CHF although she denies this.  ED PROGRESS: Labs show no leukocytosis. Lactate normal. Potassium is slightly low at 3.2. We'll replace. Creatinine is mildly elevated at 1.6 which she has had in  the past.  Chest x-ray shows diffuse interstitial densities concerning for pulmonary edema. We'll stop IV fluids. Will add on BNP. She has only received 400 mL. There is also nodular densities in the right lung concerning for possible neoplasm or metastatic disease. CT scan recommended for further evaluation. Will obtain a CT of her chest in the ED today. Patient reports some improvement with albuterol.   5:40 PM  Pt's oxygen saturation dropped to 65% after she ambulated to the bathroom. She does not wear oxygen at home. Will admit for hypoxia, COPD exacerbation. CT scan pending.   6:40 PM  Pt's blood pressure is now in the 70 systolic but she is still mentating well. States she does feel slightly lightheaded. Her BNP is normal.  CT scan shows patchy infiltrative changes consistent with atypical pneumonia such as mycoplasma. Given she is hypertensive we'll give broad-spectrum max including bank and cefepime for broad coverage as well as azithromycin for atypical pneumonia. We'll give second IV fluid bolus. No PE seen on CT scan. Patient will need admission.  7:15 PM  Pt's BP has improved to 113/51.  7:20 PM  D/w Dr. Darrick Meigs for admission to stepdown.    [place], DO 03/17/14 1921   7:40 PM  Pt's blood pressure is 109/45. Has been seen by hospitals. Lactate is 2.57.  Has received 3 L of IV fluids.  [place], DO 03/17/14 1942    CRITICAL CARE Performed by: Nyra Jabs   Total critical care time: 45 minutes  Critical care time was exclusive of separately billable procedures and treating other patients.  Critical care was necessary to treat or prevent imminent or life-threatening deterioration.  Critical care was time spent personally by me on the following activities: development of treatment plan with patient and/or surrogate as well as nursing, discussions with consultants, evaluation of patient's response to treatment, examination of patient, obtaining history from  patient or surrogate, ordering and performing treatments and interventions, ordering and review of laboratory studies, ordering and review of radiographic studies, pulse oximetry and re-evaluation of patient's condition.   [place], DO 03/17/14 1942

## 2014-03-17 NOTE — ED Notes (Signed)
Patient was 88% on 2L/[place] and with cont. Neb finishing, patient insisted on ambulating to restroom, stats was 65% with patient winded to the point of having to stop and lean against the wall on the way back to the room. Patient back in room, O2 2L/ placed patient oxygen stats quickly rebounded back to her baseline high 80's.

## 2014-03-17 NOTE — H&P (Addendum)
PCP:   [facility]   Chief Complaint:  Shortness of breath  HPI:  59 year old female who  has a past medical history of Near syncope; Anxiety and depression; Hyperlipidemia; Hypertension; Hypothyroid; Tobacco abuse; Chronic pain (11/22/2010); Migraine headache (11/22/2010); Gastroesophageal reflux disease (11/22/2010); Pneumonia; Bronchitis; CHF (congestive heart failure); Anemia; Myocardial infarction; Peripheral vascular disease; and Cancer. Today presents to the ED with chief complaint of shortness of breath, fever, cough. Patient says that she started feeling sick on Sunday at that time she had temperature 102, dry cough with shortness of breath. Today she was seen at the urgent care where chest x-ray showed pneumonia and patient was sent to the ED for further evaluation. She denies nausea vomiting but had intermittent diarrhea. She had 3 loose bowel movements today. In the ED chest x-ray showed possible lung mass so CT angiogram was done which only showed atypical pneumonia. Patient started on vancomycin, cefepime, Zithromax.  Allergies:   Allergies  Allergen Reactions  . Acetaminophen-Codeine Nausea And Vomiting  . Aspirin Nausea And Vomiting    (can take coated aspirin)  . Celecoxib Other (See Comments)    "does the opposite of what it is suppose to do"  . Levaquin [Levofloxacin In D5w] Itching  . Lipitor [Atorvastatin] Other (See Comments)    Myalgias   . Sulfa Antibiotics Nausea And Vomiting  . Adhesive [Tape] Rash  . Latex Rash      Past Medical History  Diagnosis Date  . Near syncope   . Anxiety and depression   . Hyperlipidemia   . Hypertension   . Hypothyroid   . Tobacco abuse     1/2 pack per day  . Chronic pain 11/22/2010  . Migraine headache 11/22/2010  . Gastroesophageal reflux disease 11/22/2010  . Pneumonia   . Bronchitis   . CHF (congestive heart failure)   . Anemia   . Myocardial infarction   . Peripheral vascular disease   .  Cancer     uterus    Past Surgical History  Procedure Laterality Date  . Cholecystectomy  2007  . Abdominal hysterectomy      without oophorectomy for neoplastic disease  . Nasal sinus surgery    . Dilation and curettage of uterus    . Lumbar laminectomy  x3    Left; complicated by neurologic dysfunction  . Laparotomy    . Carpal tunnel release      Left  . Cardiac catheterization  07/2013    normal coronary arteries  . Back surgery    . Left heart catheterization with coronary angiogram N/A 07/12/2013    Procedure: LEFT HEART CATHETERIZATION WITH CORONARY ANGIOGRAM;  Surgeon: Leonie Man, MD;  Location[facility] C[facility] LAB;  Service: Cardiovascular;  Laterality: N/A;    Prior to Admission medications   Medication Sig Start Date End Date Taking? Authorizing Provider  AFLURIA SUSP  10/19/13   Historical Provider, MD  albuterol (PROAIR HFA) 108 (90 BASE) MCG/ACT inhaler Inhale 2 puffs into the lungs every 6 (six) hours as needed for wheezing or shortness of breath.    Historical Provider, MD  albuterol (PROVENTIL) 2 MG tablet Take 2 mg by mouth 3 (three) times daily. For shortness of breath    Historical Provider, MD  ALPRAZolam (XANAX) 1 MG tablet Take 1 tablet (1 mg total) by mouth 3 (three) times daily as needed for anxiety. 12/22/13   Kristen N Ward, DO  aspirin EC 81 MG tablet Take 81 mg by  mouth at bedtime.    Historical Provider, MD  BIOTIN PO Take 1 capsule by mouth daily.    Historical Provider, MD  busPIRone (BUSPAR) 15 MG tablet Take 15 mg by mouth 2 (two) times daily.    Historical Provider, MD  CHANTIX 0.5 MG tablet  11/26/13   Historical Provider, MD  Cholecalciferol (VITAMIN D PO) Take 1 tablet by mouth daily.    Historical Provider, MD  diphenhydrAMINE (BENADRYL) 25 MG tablet Take 25 mg by mouth at bedtime as needed for sleep.     Historical Provider, MD  DULoxetine (CYMBALTA) 60 MG capsule Take 120 mg by mouth daily.     Historical Provider, MD  DULoxetine (CYMBALTA) 60 MG  capsule Take 1 capsule (60 mg total) by mouth 2 (two) times daily. 12/22/13   Kristen N Ward, DO  EVENING PRIMROSE OIL PO Take 1 capsule by mouth daily.    Historical Provider, MD  fluticasone Asencion Islam) 50 MCG/ACT nasal spray  11/26/13   Historical Provider, MD  hydrOXYzine (ATARAX/VISTARIL) 25 MG tablet Take 1-2 tablets (25-50 mg total) by mouth every 6 (six) hours as needed for itching. 03/29/13   Evalee Jefferson, PA-C  isosorbide mononitrate (IMDUR) 30 MG 24 hr tablet Take 1 tablet (30 mg total) by mouth daily. 05/25/13   Herminio Commons, MD  levothyroxine (LEVOTHROID) 25 MCG tablet Take 25 mcg by mouth every morning.     Historical Provider, MD  LORazepam (ATIVAN) 1 MG tablet Take 1 tablet (1 mg total) by mouth 2 (two) times daily as needed for anxiety. 07/15/13   Francine Graven, DO  methocarbamol (ROBAXIN) 500 MG tablet Take 500 mg by mouth every 8 (eight) hours as needed for muscle spasms.    Historical Provider, MD  NEXIUM 40 MG capsule Take 40 mg by mouth 2 (two) times daily before a meal.  12/23/11   Historical Provider, MD  oxycodone (ROXICODONE) 30 MG immediate release tablet Take 1 tablet (30 mg total) by mouth 2 (two) times daily as needed for pain. 05/25/13   Herminio Commons, MD  Potassium 99 MG TABS Take 99 mg by mouth daily.    Historical Provider, MD  tiZANidine (ZANAFLEX) 4 MG tablet Take 4 mg by mouth every 8 (eight) hours as needed for muscle spasms.    Historical Provider, MD  topiramate (TOPAMAX) 25 MG tablet  11/26/13   Historical Provider, MD  traMADol (ULTRAM) 50 MG tablet Take 1 tablet (50 mg total) by mouth every 6 (six) hours as needed. 12/01/13   Tammy L. Triplett, PA-C  vitamin C (ASCORBIC ACID) 500 MG tablet Take 500 mg by mouth daily.    Historical Provider, MD  VITAMIN E PO Take 1 capsule by mouth daily.    Historical Provider, MD    Social History:  reports that she has been smoking Cigarettes.  She started smoking about 41 years ago. She has a 20 pack-year  smoking history. She has never used smokeless tobacco. She reports that she does not drink alcohol or use illicit drugs.  Family History  Problem Relation Age of Onset  . Colon polyps Mother   . Deep vein thrombosis Mother   . Heart disease Mother   . Hypertension Mother   . Bleeding Disorder Mother   . Hypertension Sister   . Heart disease Brother     before age 50  . Hypertension Brother      All the positives are listed in BOLD  Review of Systems:  HEENT:  Headache, blurred vision, runny nose, sore throat Neck: Hypothyroidism, hyperthyroidism,,lymphadenopathy Chest : Shortness of breath, history of COPD, Asthma Heart : Chest pain, history of coronary arterey disease GI:  Nausea, vomiting, diarrhea, constipation, GERD GU: Dysuria, urgency, frequency of urination, hematuria Neuro: Stroke, seizures, syncope Psych: Depression, anxiety, hallucinations   Physical Exam: Blood pressure 109/45, pulse 92, temperature 97.8 F (36.6 C), temperature source Oral, resp. rate 20, height 5\' 2"  (1.575 m), weight 58.968 kg (130 lb), SpO2 91 %. Constitutional:   Patient is a well-developed and well-nourished female* in no acute distress and cooperative with exam. Head: Normocephalic and atraumatic Mouth: Mucus membranes moist Eyes: PERRL, EOMI, conjunctivae normal Neck: Supple, No Thyromegaly Cardiovascular: RRR, S1 normal, S2 normal Pulmonary/Chest: Decreased breath sounds at lung bases Abdominal: Soft. Non-tender, non-distended, bowel sounds are normal, no masses, organomegaly, or guarding present.  Neurological: A&O x3, Strength is normal and symmetric bilaterally, cranial nerve II-XII are grossly intact, no focal motor deficit, sensory intact to light touch bilaterally.  Extremities : No Cyanosis, Clubbing or Edema  Labs on Admission:  Basic Metabolic Panel:  Recent Labs Lab 03/17/14 1533  NA 130*  K 3.2*  CL 96  CO2 24  GLUCOSE 140*  BUN 16  CREATININE 1.60*  CALCIUM 8.6    Liver Function Tests:  Recent Labs Lab 03/17/14 1533  AST 48*  ALT 22  ALKPHOS 60  BILITOT 0.5  PROT 7.2  ALBUMIN 3.5   No results for input(s): LIPASE, AMYLASE in the last 168 hours. No results for input(s): AMMONIA in the last 168 hours. CBC:  Recent Labs Lab 03/17/14 1533  WBC 9.4  NEUTROABS 7.3  HGB 13.1  HCT 38.1  MCV 92.3  PLT 199   Cardiac Enzymes: No results for input(s): CKTOTAL, CKMB, CKMBINDEX, TROPONINI in the last 168 hours.  BNP (last 3 results)  Recent Labs  03/17/14 1533  BNP 66.0    ProBNP (last 3 results) No results for input(s): PROBNP in the last 8760 hours.  CBG: No results for input(s): GLUCAP in the last 168 hours.  Radiological Exams on Admission: Ct Angio Chest Pe W/cm &/or Wo Cm  03/17/2014   CLINICAL DATA:  Cough and fever for 5 days  EXAM: CT ANGIOGRAPHY CHEST WITH CONTRAST  TECHNIQUE: Multidetector CT imaging of the chest was performed using the standard protocol during bolus administration of intravenous contrast. Multiplanar CT image reconstructions and MIPs were obtained to evaluate the vascular anatomy.  CONTRAST:  27mL OMNIPAQUE IOHEXOL 350 MG/ML SOLN  COMPARISON:  10/03/2008  FINDINGS: The lungs are well aerated bilaterally. Some mild emphysematous changes are seen. Patchy infiltrative changes are identified which corresponds to the nodule seen on prior chest x-ray. This is scattered within the right upper lobe as well as the lingula and lower lobes bilaterally. The most prominent focal infiltrate lies within the posterior aspect of the left lower lobe. There are some changes consistent with tree-in-bud appearance and this may represent an atypical pneumonia such as mycoplasma. No sizable effusion is seen. The hilar and mediastinal structures show only small lymph nodes in the hila bilaterally. None of these are significant by size criteria and are likely reactive in nature.  The thoracic aorta demonstrates some mild atherosclerotic  change without aneurysmal dilatation or dissection. There is short segment occlusion of the left subclavian artery just beyond its origin with likely retrograde opacification via the left vertebral artery. These changes are chronic in nature from the prior exam.  The pulmonary artery shows no  definitive pulmonary emboli although is incompletely opacified.  The visualized upper abdomen demonstrates evidence of prior granulomatous disease. The gallbladder has been surgically removed. The osseous structures are grossly unremarkable.  Review of the MIP images confirms the above findings.  IMPRESSION: Patchy infiltrative changes in the lungs bilaterally consistent with an atypical pneumonia such as mycoplasma.  No definitive mass lesion is noted. The nodular changes on recent chest x-ray are secondary to the patchy infiltrates seen in both lungs.  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  No definitive pulmonary embolism is identified   Electronically Signed   By: Inez Catalina M.D.   On: 03/17/2014 18:17   Dg Chest Port 1 View  03/17/2014   CLINICAL DATA:  Cough, shortness of breath.  EXAM: PORTABLE CHEST - 1 VIEW  COMPARISON:  Same day.  FINDINGS: The heart size and mediastinal contours are within normal limits. Diffuse interstitial densities are noted throughout both lungs concerning for pulmonary edema. Nodular densities are noted in the right lung concerning for neoplasm or metastatic disease. The visualized skeletal structures are unremarkable.  IMPRESSION: Diffuse interstitial densities are noted throughout both lungs concerning for central pulmonary vascular congestion and pulmonary edema. Nodular densities are noted in the right lung concerning for possible neoplasm or metastatic disease. CT scan of the chest is recommended for further evaluation.   Electronically Signed   By: Marijo Conception, M.D.   On: 03/17/2014 16:18       Assessment/Plan Principal Problem:   Pneumonia Active  Problems:   Hypothyroid   Tobacco abuse   Chronic pain   CAP (community acquired pneumonia)   Hypokalemia   Hyponatremia  Sepsis Patient presenting with sepsis, secondary to pneumonia. Will continue vancomycin cefepime and Zithromax. Follow the blood culture results. Vision has received 3 L of IV fluids in the ED her blood pressure has stabilized. Will continue normal saline at 125 MR per hour.  COPD exacerbation Patient received Solu-Medrol 125 g in the ED, continue Solu-Medrol 60 mg IV every 6 hours. Xopenex nebulizers every 6 hours and ipratropium nebulizations every 6 hours.  Hypokalemia Replace potassium and check BMP in a.m.  AKI Cr is 1.60, will continue with iv fluids and check bmp in am.  Hyponatremia Likely from dehydration diarrhea, continue IV fluids check BMP in a.m.  Diarrhea Patient has intermittent diarrhea, will check stool for C. difficile PCR.  DVT prophylaxis Lovenox  Code status: Patient is full code  Family discussion: No family at bedside.  Time Spent on Admission: 60 minutes  [facility] Pager: [phone] 03/17/2014, 7:50 PM  If 7PM-7AM, please contact night-coverage  [email or web address]  Password [email or web address]

## 2014-03-17 NOTE — ED Notes (Signed)
BP 60/49, NS started back wide open, increased to 74/47. Manual 75/50. Ward in room to assess patient.

## 2014-03-17 NOTE — ED Notes (Signed)
Second lactic acid 2.57, Ward notified.

## 2014-03-17 NOTE — Progress Notes (Signed)
ANTIBIOTIC CONSULT NOTE - INITIAL  Pharmacy Consult for Vancomycin and Cefepime Indication: pneumonia  Allergies  Allergen Reactions  . Acetaminophen-Codeine Nausea And Vomiting  . Aspirin Nausea And Vomiting    (can take coated aspirin)  . Celecoxib Other (See Comments)    "does the opposite of what it is suppose to do"  . Levaquin [Levofloxacin In D5w] Itching  . Lipitor [Atorvastatin] Other (See Comments)    Myalgias   . Sulfa Antibiotics Nausea And Vomiting  . Adhesive [Tape] Rash  . Latex Rash    Patient Measurements: Height: 5\' 2"  (157.5 cm) Weight: 131 lb 6.3 oz (59.6 kg) IBW/kg (Calculated) : 50.1  Vital Signs: Temp: 98.6 F (37 C) (02/11 2117) Temp Source: Axillary (02/11 2117) BP: 110/50 mmHg (02/11 2030) Pulse Rate: 93 (02/11 2030) Intake/Output from previous day:   Intake/Output from this shift: Total I/O In: 450 [IV Piggyback:450] Out: 525 [Urine:525]  Labs:  Recent Labs  03/17/14 1533  WBC 9.4  HGB 13.1  PLT 199  CREATININE 1.60*   Estimated Creatinine Clearance: 30.7 mL/min (by C-G formula based on Cr of 1.6). No results for input(s): VANCOTROUGH, VANCOPEAK, VANCORANDOM, GENTTROUGH, GENTPEAK, GENTRANDOM, TOBRATROUGH, TOBRAPEAK, TOBRARND, AMIKACINPEAK, AMIKACINTROU, AMIKACIN in the last 72 hours.   Microbiology: Recent Results (from the past 720 hour(s))  Blood culture (routine x 2)     Status: None (Preliminary result)   Collection Time: 03/17/14  3:38 PM  Result Value Ref Range Status   Specimen Description BLOOD RIGHT ANTECUBITAL  Final   Special Requests BOTTLES DRAWN AEROBIC ONLY 5CC  Final   Culture PENDING  Incomplete   Report Status PENDING  Incomplete  Blood culture (routine x 2)     Status: None (Preliminary result)   Collection Time: 03/17/14  4:25 PM  Result Value Ref Range Status   Specimen Description BLOOD LEFT ARM  Final   Special Requests BOTTLES DRAWN AEROBIC AND ANAEROBIC 6CC  Final   Culture PENDING  Incomplete    Report Status PENDING  Incomplete    Medical History: Past Medical History  Diagnosis Date  . Near syncope   . Anxiety and depression   . Hyperlipidemia   . Hypertension   . Hypothyroid   . Tobacco abuse     1/2 pack per day  . Chronic pain 11/22/2010  . Migraine headache 11/22/2010  . Gastroesophageal reflux disease 11/22/2010  . Pneumonia   . Bronchitis   . CHF (congestive heart failure)   . Anemia   . Myocardial infarction   . Peripheral vascular disease   . Cancer     uterus   Anti-infectives    Start     Dose/Rate Route Frequency Ordered Stop   03/18/14 2200  ceFEPIme (MAXIPIME) 1 g in dextrose 5 % 50 mL IVPB     1 g 100 mL/hr over 30 Minutes Intravenous Every 24 hours 03/17/14 2149     03/18/14 2000  azithromycin (ZITHROMAX) 500 mg in dextrose 5 % 250 mL IVPB     500 mg 250 mL/hr over 60 Minutes Intravenous Every 24 hours 03/17/14 2114     03/18/14 1800  vancomycin (VANCOCIN) IVPB 1000 mg/200 mL premix     1,000 mg 200 mL/hr over 60 Minutes Intravenous Every 24 hours 03/17/14 2145     03/17/14 2200  ceFEPIme (MAXIPIME) 1 g in dextrose 5 % 50 mL IVPB  Status:  Discontinued     1 g 100 mL/hr over 30 Minutes Intravenous Every 24 hours 03/17/14  2146 03/17/14 2149   03/17/14 1845  vancomycin (VANCOCIN) IVPB 1000 mg/200 mL premix     1,000 mg 200 mL/hr over 60 Minutes Intravenous  Once 03/17/14 1841 03/17/14 2105   03/17/14 1845  ceFEPIme (MAXIPIME) 2 g in dextrose 5 % 50 mL IVPB     2 g 100 mL/hr over 30 Minutes Intravenous  Once 03/17/14 1841 03/17/14 1942   03/17/14 1845  azithromycin (ZITHROMAX) 500 mg in dextrose 5 % 250 mL IVPB     500 mg 250 mL/hr over 60 Minutes Intravenous  Once 03/17/14 1843 03/17/14 2105     Assessment: 59yo female with suspected pneumonia.  Broad spectrum abx to be started.   Estimated Creatinine Clearance: 30.7 mL/min (by C-G formula based on Cr of 1.6).  Goal of Therapy:  Vancomycin trough level 15-20 mcg/ml Eradicate  infection.  Plan:  Cefepime 1gm IV q24hrs Vancomycin 1gm IV q24hrs Check trough at steady state Monitor labs, renal fxn, and cultures  Hart Robinsons A 03/17/2014,9:50 PM

## 2014-03-17 NOTE — ED Notes (Addendum)
Patient attempted to wipe and get herself off the bedpan, from that little movement patients oxygen stats dropped to 68% and patient was mildly cyanotic pursed lipped breathing upon my arrival into room.   Despite multiple explanations by myself that patient is high risk for bipap and should not have anything on her stomach due to aspiration and possible intubation risk, patient continues to sip on soda. Ward aware.  Patient also continuing talking about eating "fries" which her husband is going to bring her, again I explained risk to her.  RT stuck patient, sample turned out to be venous, patient refusing further ABG sticks, Ward notified.

## 2014-03-18 DIAGNOSIS — J9601 Acute respiratory failure with hypoxia: Secondary | ICD-10-CM | POA: Diagnosis present

## 2014-03-18 DIAGNOSIS — J441 Chronic obstructive pulmonary disease with (acute) exacerbation: Secondary | ICD-10-CM | POA: Diagnosis present

## 2014-03-18 DIAGNOSIS — A419 Sepsis, unspecified organism: Principal | ICD-10-CM

## 2014-03-18 DIAGNOSIS — J9621 Acute and chronic respiratory failure with hypoxia: Secondary | ICD-10-CM

## 2014-03-18 DIAGNOSIS — N179 Acute kidney failure, unspecified: Secondary | ICD-10-CM | POA: Diagnosis present

## 2014-03-18 LAB — CBC
HCT: 31.5 % — ABNORMAL LOW (ref 36.0–46.0)
Hemoglobin: 10.5 g/dL — ABNORMAL LOW (ref 12.0–15.0)
MCH: 31.3 pg (ref 26.0–34.0)
MCHC: 33.3 g/dL (ref 30.0–36.0)
MCV: 94 fL (ref 78.0–100.0)
PLATELETS: 188 10*3/uL (ref 150–400)
RBC: 3.35 MIL/uL — ABNORMAL LOW (ref 3.87–5.11)
RDW: 12.9 % (ref 11.5–15.5)
WBC: 4.5 10*3/uL (ref 4.0–10.5)

## 2014-03-18 LAB — COMPREHENSIVE METABOLIC PANEL
ALK PHOS: 45 U/L (ref 39–117)
ALT: 24 U/L (ref 0–35)
AST: 42 U/L — ABNORMAL HIGH (ref 0–37)
Albumin: 2.8 g/dL — ABNORMAL LOW (ref 3.5–5.2)
Anion gap: 6 (ref 5–15)
BILIRUBIN TOTAL: 0.3 mg/dL (ref 0.3–1.2)
BUN: 11 mg/dL (ref 6–23)
CHLORIDE: 108 mmol/L (ref 96–112)
CO2: 21 mmol/L (ref 19–32)
Calcium: 8.2 mg/dL — ABNORMAL LOW (ref 8.4–10.5)
Creatinine, Ser: 0.94 mg/dL (ref 0.50–1.10)
GFR calc Af Amer: 77 mL/min — ABNORMAL LOW (ref >90)
GFR, EST NON AFRICAN AMERICAN: 66 mL/min — AB (ref >90)
Glucose, Bld: 149 mg/dL — ABNORMAL HIGH (ref 70–99)
POTASSIUM: 4.4 mmol/L (ref 3.5–5.1)
Sodium: 135 mmol/L (ref 135–145)
Total Protein: 5.7 g/dL — ABNORMAL LOW (ref 6.0–8.3)

## 2014-03-18 LAB — STREP PNEUMONIAE URINARY ANTIGEN: Strep Pneumo Urinary Antigen: NEGATIVE

## 2014-03-18 LAB — MRSA PCR SCREENING: MRSA by PCR: NEGATIVE

## 2014-03-18 MED ORDER — HYDROCODONE-ACETAMINOPHEN 10-325 MG PO TABS
1.0000 | ORAL_TABLET | Freq: Four times a day (QID) | ORAL | Status: DC | PRN
  Administered 2014-03-18: 1 via ORAL
  Administered 2014-03-19 – 2014-03-22 (×5): 2 via ORAL
  Filled 2014-03-18 (×2): qty 2
  Filled 2014-03-18: qty 1
  Filled 2014-03-18 (×3): qty 2

## 2014-03-18 MED ORDER — IPRATROPIUM BROMIDE 0.02 % IN SOLN
0.5000 mg | RESPIRATORY_TRACT | Status: DC
Start: 2014-03-18 — End: 2014-03-20
  Administered 2014-03-18 – 2014-03-19 (×7): 0.5 mg via RESPIRATORY_TRACT
  Filled 2014-03-18 (×8): qty 2.5

## 2014-03-18 MED ORDER — LEVALBUTEROL HCL 0.63 MG/3ML IN NEBU
0.6300 mg | INHALATION_SOLUTION | RESPIRATORY_TRACT | Status: DC
  Administered 2014-03-18 – 2014-03-19 (×8): 0.63 mg via RESPIRATORY_TRACT
  Filled 2014-03-18 (×9): qty 3

## 2014-03-18 MED ORDER — CEFTRIAXONE SODIUM IN DEXTROSE 20 MG/ML IV SOLN
1.0000 g | INTRAVENOUS | Status: DC
  Administered 2014-03-18 – 2014-03-21 (×4): 1 g via INTRAVENOUS
  Filled 2014-03-18 (×6): qty 50

## 2014-03-18 MED ORDER — LEVALBUTEROL HCL 0.63 MG/3ML IN NEBU: 0.6300 mg | INHALATION_SOLUTION | RESPIRATORY_TRACT | Status: DC | PRN

## 2014-03-18 MED ORDER — GUAIFENESIN ER 600 MG PO TB12
1200.0000 mg | ORAL_TABLET | Freq: Two times a day (BID) | ORAL | Status: DC
  Administered 2014-03-18 – 2014-03-22 (×9): 1200 mg via ORAL
  Filled 2014-03-18 (×9): qty 2

## 2014-03-18 NOTE — Care Management Utilization Note (Signed)
UR completed 

## 2014-03-18 NOTE — Care Management Note (Addendum)
    Page 1 of 2   03/22/2014     1:39:42 PM CARE MANAGEMENT NOTE 03/22/2014  Patient:  DAVONNA, ERTL   Account Number:  [id number]  Date Initiated:  03/18/2014  Documentation initiated by:  Jolene Provost  Subjective/Objective Assessment:   Pt is from home with husband, admitted for COPD exacerbation. Pt has no HH services, DME's or med needs prior to admission. Pt has old CPAP but she no longer has the attachments too. Pt has not used the CPAP in >2 years and cannot remember     Action/Plan:   the agency the machine was purchased from. Pt would like another one but will require a new sleep study, discussed with MD and this should be completed as OP by pt's PCP. Pt is active at the [facility].   Anticipated DC Date:  03/21/2014   Anticipated DC Plan:  [place]  CM consult      PAC Choice  [place]   Choice offered to / List presented to:  C-1 Patient   DME arranged  NEBULIZER MACHINE      DME agency  [facility]        Status of service:  Completed, signed off Medicare Important Message given?  YES (If response is "NO", the following Medicare IM given date fields will be blank) Date Medicare IM given:  03/18/2014 Medicare IM given by:  Jolene Provost Date Additional Medicare IM given:  03/22/2014 Additional Medicare IM given by:  Theophilus Kinds  Discharge Disposition:  [place]  Per UR Regulation:  Reviewed for med. necessity/level of care/duration of stay  If discussed at [place] of Stay Meetings, dates discussed:   03/22/2014    Comments:  03/22/14 [place], RN BSN CM Pt discharged home today. No HH needs at this time. Pt neb machine arranged with [facility] (per pts request) and order faxed. [facility] will deliver to pts home this pm. Pt and pts nurse aware of discharge arrangements.   03/18/2014 [place], RN, MSN, CM Pt plans to discharge home with [facility]. Pt will need follow up appointment with [facility]. MD anticipates need for [facility] RN and neb machine. Pt chooses [facility] for [facility] and DME's. Romualdo Bolk of [facility] notified of referral and will obtain pt information from chart. Ernestina Columbia notified of DMe order and will obtain pt infomration from chart and have machine delivered to pt's home. Pt will require home O2 assessment prior to discharge. If pt discharges home over weekend instructions left for RN to notify [facility] of discharge and where to fax HH/DME orders.

## 2014-03-18 NOTE — Progress Notes (Signed)
[facility] PROGRESS NOTE  TUYET BADER JME:[id number] DOB: 1955/09/05 DOA: 03/17/2014 PCP: [place]  Assessment/Plan: 1. Acute respiratory failure. Felt to be related to underlying pneumonia and COPD. Will wean down oxygen as tolerated. 2. Sepsis. Possibly related to pneumonia. Blood pressure appears to be stabilizing. She is afebrile. Continue antibiotics. 3. Pneumonia. Patient was started on vancomycin, cefepime, azithromycin. Since the patient would have a community-acquired pneumonia and is clinically improving, will discontinue vancomycin and cefepime. Will start Rocephin and continue azithromycin. Continue pulmonary hygiene. 4. COPD exacerbation. Continue antibiotics, bronchodilators and steroids. Will initiate flutter valve. 5. Diarrhea. Stool for C. Difficile has been ordered needs to be collected. 6. Acute kidney injury. Likely related to dehydration and sepsis. Improved with IV fluids. 7. Tobacco use. Patient declines nicotine patch.  Code Status: full code Family Communication: discussed with patient and family at the bedside Disposition Plan: discharge home once improve   Consultants:    Procedures:    Antibiotics:  Vancomycin 2/11>>2/12  Cefepime 2/11>>2/12  Azithro 2/11>>  Rocephin 2/12>>  HPI/Subjective: Still feels short of breath and is wheezing  Objective: Filed Vitals:   03/18/14 0907  BP:   Pulse:   Temp: 98.1 F (36.7 C)  Resp:     Intake/Output Summary (Last 24 hours) at 03/18/14 1039 Last data filed at 03/18/14 0900  Gross per 24 hour  Intake 2070.84 ml  Output   1025 ml  Net 1045.84 ml   Filed Weights   03/17/14 1456 03/17/14 2117 03/18/14 0508  Weight: 58.968 kg (130 lb) 59.6 kg (131 lb 6.3 oz) 60.3 kg (132 lb 15 oz)    Exam:   General:  NAD  Cardiovascular: S1, s2 RRR  Respiratory: bilateral wheezing  Abdomen: soft, nt, nd, bs+  Musculoskeletal: no edema b/l   Data Reviewed: Basic  Metabolic Panel:  Recent Labs Lab 03/17/14 1533 03/18/14 0439  NA 130* 135  K 3.2* 4.4  CL 96 108  CO2 24 21  GLUCOSE 140* 149*  BUN 16 11  CREATININE 1.60* 0.94  CALCIUM 8.6 8.2*   Liver Function Tests:  Recent Labs Lab 03/17/14 1533 03/18/14 0439  AST 48* 42*  ALT 22 24  ALKPHOS 60 45  BILITOT 0.5 0.3  PROT 7.2 5.7*  ALBUMIN 3.5 2.8*   No results for input(s): LIPASE, AMYLASE in the last 168 hours. No results for input(s): AMMONIA in the last 168 hours. CBC:  Recent Labs Lab 03/17/14 1533 03/18/14 0439  WBC 9.4 4.5  NEUTROABS 7.3  --   HGB 13.1 10.5*  HCT 38.1 31.5*  MCV 92.3 94.0  PLT 199 188   Cardiac Enzymes: No results for input(s): CKTOTAL, CKMB, CKMBINDEX, TROPONINI in the last 168 hours. BNP (last 3 results)  Recent Labs  03/17/14 1533  BNP 66.0    ProBNP (last 3 results) No results for input(s): PROBNP in the last 8760 hours.  CBG: No results for input(s): GLUCAP in the last 168 hours.  Recent Results (from the past 240 hour(s))  Blood culture (routine x 2)     Status: None (Preliminary result)   Collection Time: 03/17/14  3:38 PM  Result Value Ref Range Status   Specimen Description BLOOD RIGHT ANTECUBITAL  Final   Special Requests BOTTLES DRAWN AEROBIC ONLY 5CC  Final   Culture NO GROWTH 1 DAY  Final   Report Status PENDING  Incomplete  Blood culture (routine x 2)     Status: None (Preliminary result)  Collection Time: 03/17/14  4:25 PM  Result Value Ref Range Status   Specimen Description BLOOD LEFT ARM  Final   Special Requests BOTTLES DRAWN AEROBIC AND ANAEROBIC 6CC  Final   Culture NO GROWTH 1 DAY  Final   Report Status PENDING  Incomplete  MRSA PCR Screening     Status: None   Collection Time: 03/17/14  9:08 PM  Result Value Ref Range Status   MRSA by PCR NEGATIVE NEGATIVE Final    Comment:        The GeneXpert MRSA Assay (FDA approved for NASAL specimens only), is one component of a comprehensive MRSA  colonization surveillance program. It is not intended to diagnose MRSA infection nor to guide or monitor treatment for MRSA infections.      Studies: Ct Angio Chest Pe W/cm &/or Wo Cm  03/17/2014   CLINICAL DATA:  Cough and fever for 5 days  EXAM: CT ANGIOGRAPHY CHEST WITH CONTRAST  TECHNIQUE: Multidetector CT imaging of the chest was performed using the standard protocol during bolus administration of intravenous contrast. Multiplanar CT image reconstructions and MIPs were obtained to evaluate the vascular anatomy.  CONTRAST:  48mL OMNIPAQUE IOHEXOL 350 MG/ML SOLN  COMPARISON:  10/03/2008  FINDINGS: The lungs are well aerated bilaterally. Some mild emphysematous changes are seen. Patchy infiltrative changes are identified which corresponds to the nodule seen on prior chest x-ray. This is scattered within the right upper lobe as well as the lingula and lower lobes bilaterally. The most prominent focal infiltrate lies within the posterior aspect of the left lower lobe. There are some changes consistent with tree-in-bud appearance and this may represent an atypical pneumonia such as mycoplasma. No sizable effusion is seen. The hilar and mediastinal structures show only small lymph nodes in the hila bilaterally. None of these are significant by size criteria and are likely reactive in nature.  The thoracic aorta demonstrates some mild atherosclerotic change without aneurysmal dilatation or dissection. There is short segment occlusion of the left subclavian artery just beyond its origin with likely retrograde opacification via the left vertebral artery. These changes are chronic in nature from the prior exam.  The pulmonary artery shows no definitive pulmonary emboli although is incompletely opacified.  The visualized upper abdomen demonstrates evidence of prior granulomatous disease. The gallbladder has been surgically removed. The osseous structures are grossly unremarkable.  Review of the MIP images  confirms the above findings.  IMPRESSION: Patchy infiltrative changes in the lungs bilaterally consistent with an atypical pneumonia such as mycoplasma.  No definitive mass lesion is noted. The nodular changes on recent chest x-ray are secondary to the patchy infiltrates seen in both lungs.  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  No definitive pulmonary embolism is identified   Electronically Signed   By: Inez Catalina M.D.   On: 03/17/2014 18:17   Dg Chest Port 1 View  03/17/2014   CLINICAL DATA:  Cough, shortness of breath.  EXAM: PORTABLE CHEST - 1 VIEW  COMPARISON:  Same day.  FINDINGS: The heart size and mediastinal contours are within normal limits. Diffuse interstitial densities are noted throughout both lungs concerning for pulmonary edema. Nodular densities are noted in the right lung concerning for neoplasm or metastatic disease. The visualized skeletal structures are unremarkable.  IMPRESSION: Diffuse interstitial densities are noted throughout both lungs concerning for central pulmonary vascular congestion and pulmonary edema. Nodular densities are noted in the right lung concerning for possible neoplasm or metastatic disease. CT scan of  the chest is recommended for further evaluation.   Electronically Signed   By: Marijo Conception, M.D.   On: 03/17/2014 16:18    Scheduled Meds: . aspirin EC  81 mg Oral QHS  . azithromycin  500 mg Intravenous Q24H  . busPIRone  15 mg Oral BID  . ceFEPime (MAXIPIME) IV  1 g Intravenous Q24H  . DULoxetine  60 mg Oral BID  . enoxaparin (LOVENOX) injection  40 mg Subcutaneous Q24H  . ipratropium  0.5 mg Nebulization Q6H  . levalbuterol  0.63 mg Nebulization Q6H  . levothyroxine  25 mcg Oral QAC breakfast  . methylPREDNISolone (SOLU-MEDROL) injection  60 mg Intravenous Q6H  . pantoprazole  80 mg Oral Daily  . vancomycin  1,000 mg Intravenous Q24H   Continuous Infusions: . sodium chloride 125 mL/hr at 03/18/14 0700    Principal  Problem:   Pneumonia Active Problems:   Hypothyroid   Tobacco abuse   Chronic pain   CAP (community acquired pneumonia)    Time spent: 78mins    [name]  [facility] Pager [phone]. If 7PM-7AM, please contact night-coverage at [email or web address], password [facility] 03/18/2014, 10:40 AM  LOS: 1 day

## 2014-03-19 DIAGNOSIS — J9601 Acute respiratory failure with hypoxia: Secondary | ICD-10-CM

## 2014-03-19 LAB — BASIC METABOLIC PANEL
ANION GAP: 7 (ref 5–15)
BUN: 8 mg/dL (ref 6–23)
CO2: 24 mmol/L (ref 19–32)
Calcium: 8.8 mg/dL (ref 8.4–10.5)
Chloride: 107 mmol/L (ref 96–112)
Creatinine, Ser: 0.74 mg/dL (ref 0.50–1.10)
GFR calc non Af Amer: >90 mL/min (ref >90)
Glucose, Bld: 136 mg/dL — ABNORMAL HIGH (ref 70–99)
Potassium: 3.9 mmol/L (ref 3.5–5.1)
SODIUM: 138 mmol/L (ref 135–145)

## 2014-03-19 LAB — CBC
HCT: 32.4 % — ABNORMAL LOW (ref 36.0–46.0)
Hemoglobin: 10.7 g/dL — ABNORMAL LOW (ref 12.0–15.0)
MCH: 31.1 pg (ref 26.0–34.0)
MCHC: 33 g/dL (ref 30.0–36.0)
MCV: 94.2 fL (ref 78.0–100.0)
Platelets: 213 10*3/uL (ref 150–400)
RBC: 3.44 MIL/uL — ABNORMAL LOW (ref 3.87–5.11)
RDW: 13.3 % (ref 11.5–15.5)
WBC: 13.2 10*3/uL — AB (ref 4.0–10.5)

## 2014-03-19 MED ORDER — METHYLPREDNISOLONE SODIUM SUCC 125 MG IJ SOLR
60.0000 mg | Freq: Two times a day (BID) | INTRAMUSCULAR | Status: DC
  Administered 2014-03-19 – 2014-03-20 (×2): 60 mg via INTRAVENOUS
  Filled 2014-03-19 (×2): qty 2

## 2014-03-19 NOTE — Progress Notes (Signed)
Obtained order for CPAP, PT placed on nasal mask auto titrate. 6-15

## 2014-03-19 NOTE — Plan of Care (Signed)
Problem: Phase I Progression Outcomes Goal: Dyspnea controlled at rest Outcome: Progressing Improving but not controlled    Goal: Pain controlled with appropriate interventions Outcome: Progressing Obtained orders for home regimen for pain- improving slightly patient suffers from chronic pain Goal: OOB as tolerated unless otherwise ordered Outcome: Progressing OOB to [facility] with one assist Goal: Code status addressed with pt/family Outcome: Progressing Full code Goal: Initial discharge plan identified Outcome: [place] with family

## 2014-03-19 NOTE — Progress Notes (Signed)
Patient brought in home CPAP she has no tubing, mask or cord to run her machine. Will try obtain what she has at home.

## 2014-03-19 NOTE — Progress Notes (Signed)
Pt transferring to unit 300 today. Pt/family is aware and agreeable to transfer. Assessment is unchanged from this morning and receiving RN has been given report. Belongings sent with pt at bedside.  

## 2014-03-19 NOTE — Progress Notes (Signed)
[facility] PROGRESS NOTE  [name]:[id number] DOB: 1955-08-12 DOA: 03/17/2014 PCP: [place]  Assessment/Plan: 1. Acute respiratory failure. Felt to be related to underlying pneumonia and COPD. Will wean down oxygen as tolerated. 2. Sepsis. Possibly related to pneumonia. Blood pressure appears to be stabilizing. She is afebrile. Continue antibiotics. Blood cultures have shown no growth. 3. Community acquired Pneumonia. Continue on rocephin and azithromycin. Continue pulmonary hygiene. Urine strep antigen negative. 4. COPD exacerbation. Continue antibiotics, bronchodilators and steroids. 5. Diarrhea. Stool for C. Difficile has been ordered needs to be collected. 6. Acute kidney injury. Likely related to dehydration and sepsis. Improved with IV fluids. 7. Tobacco use. Patient declines nicotine patch.  Code Status: full code Family Communication: discussed with patient and family at the bedside Disposition Plan: discharge home once improve   Consultants:    Procedures:    Antibiotics:  Vancomycin 2/11>>2/12  Cefepime 2/11>>2/12  Azithro 2/11>>  Rocephin 2/12>>  HPI/Subjective: Having productive cough. Stools are more formed. Overall feels that she is slowly improving  Objective: Filed Vitals:   03/19/14 0900  BP: 99/54  Pulse: 84  Temp:   Resp: 21    Intake/Output Summary (Last 24 hours) at 03/19/14 1029 Last data filed at 03/19/14 0900  Gross per 24 hour  Intake 3099.17 ml  Output   2450 ml  Net 649.17 ml   Filed Weights   03/17/14 2117 03/18/14 0508 03/19/14 0500  Weight: 59.6 kg (131 lb 6.3 oz) 60.3 kg (132 lb 15 oz) 62.8 kg (138 lb 7.2 oz)    Exam:   General:  NAD  Cardiovascular: S1, s2 RRR  Respiratory: occasional rhonchi, wheezing has improved  Abdomen: soft, nt, nd, bs+  Musculoskeletal: no edema b/l   Data Reviewed: Basic Metabolic Panel:  Recent Labs Lab 03/17/14 1533 03/18/14 0439  03/19/14 0521  NA 130* 135 138  K 3.2* 4.4 3.9  CL 96 108 107  CO2 24 21 24   GLUCOSE 140* 149* 136*  BUN 16 11 8   CREATININE 1.60* 0.94 0.74  CALCIUM 8.6 8.2* 8.8   Liver Function Tests:  Recent Labs Lab 03/17/14 1533 03/18/14 0439  AST 48* 42*  ALT 22 24  ALKPHOS 60 45  BILITOT 0.5 0.3  PROT 7.2 5.7*  ALBUMIN 3.5 2.8*   No results for input(s): LIPASE, AMYLASE in the last 168 hours. No results for input(s): AMMONIA in the last 168 hours. CBC:  Recent Labs Lab 03/17/14 1533 03/18/14 0439 03/19/14 0521  WBC 9.4 4.5 13.2*  NEUTROABS 7.3  --   --   HGB 13.1 10.5* 10.7*  HCT 38.1 31.5* 32.4*  MCV 92.3 94.0 94.2  PLT 199 188 213   Cardiac Enzymes: No results for input(s): CKTOTAL, CKMB, CKMBINDEX, TROPONINI in the last 168 hours. BNP (last 3 results)  Recent Labs  03/17/14 1533  BNP 66.0    ProBNP (last 3 results) No results for input(s): PROBNP in the last 8760 hours.  CBG: No results for input(s): GLUCAP in the last 168 hours.  Recent Results (from the past 240 hour(s))  Blood culture (routine x 2)     Status: None (Preliminary result)   Collection Time: 03/17/14  3:38 PM  Result Value Ref Range Status   Specimen Description BLOOD RIGHT ANTECUBITAL  Final   Special Requests BOTTLES DRAWN AEROBIC ONLY 5CC  Final   Culture NO GROWTH 2 DAYS  Final   Report Status PENDING  Incomplete  Blood culture (routine x  2)     Status: None (Preliminary result)   Collection Time: 03/17/14  4:25 PM  Result Value Ref Range Status   Specimen Description BLOOD LEFT ARM  Final   Special Requests BOTTLES DRAWN AEROBIC AND ANAEROBIC 6CC  Final   Culture NO GROWTH 2 DAYS  Final   Report Status PENDING  Incomplete  MRSA PCR Screening     Status: None   Collection Time: 03/17/14  9:08 PM  Result Value Ref Range Status   MRSA by PCR NEGATIVE NEGATIVE Final    Comment:        The GeneXpert MRSA Assay (FDA approved for NASAL specimens only), is one component of  a comprehensive MRSA colonization surveillance program. It is not intended to diagnose MRSA infection nor to guide or monitor treatment for MRSA infections.      Studies: Ct Angio Chest Pe W/cm &/or Wo Cm  03/17/2014   CLINICAL DATA:  Cough and fever for 5 days  EXAM: CT ANGIOGRAPHY CHEST WITH CONTRAST  TECHNIQUE: Multidetector CT imaging of the chest was performed using the standard protocol during bolus administration of intravenous contrast. Multiplanar CT image reconstructions and MIPs were obtained to evaluate the vascular anatomy.  CONTRAST:  50mL OMNIPAQUE IOHEXOL 350 MG/ML SOLN  COMPARISON:  10/03/2008  FINDINGS: The lungs are well aerated bilaterally. Some mild emphysematous changes are seen. Patchy infiltrative changes are identified which corresponds to the nodule seen on prior chest x-ray. This is scattered within the right upper lobe as well as the lingula and lower lobes bilaterally. The most prominent focal infiltrate lies within the posterior aspect of the left lower lobe. There are some changes consistent with tree-in-bud appearance and this may represent an atypical pneumonia such as mycoplasma. No sizable effusion is seen. The hilar and mediastinal structures show only small lymph nodes in the hila bilaterally. None of these are significant by size criteria and are likely reactive in nature.  The thoracic aorta demonstrates some mild atherosclerotic change without aneurysmal dilatation or dissection. There is short segment occlusion of the left subclavian artery just beyond its origin with likely retrograde opacification via the left vertebral artery. These changes are chronic in nature from the prior exam.  The pulmonary artery shows no definitive pulmonary emboli although is incompletely opacified.  The visualized upper abdomen demonstrates evidence of prior granulomatous disease. The gallbladder has been surgically removed. The osseous structures are grossly unremarkable.  Review of  the MIP images confirms the above findings.  IMPRESSION: Patchy infiltrative changes in the lungs bilaterally consistent with an atypical pneumonia such as mycoplasma.  No definitive mass lesion is noted. The nodular changes on recent chest x-ray are secondary to the patchy infiltrates seen in both lungs.  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  No definitive pulmonary embolism is identified   Electronically Signed   By: Inez Catalina M.D.   On: 03/17/2014 18:17   Dg Chest Port 1 View  03/17/2014   CLINICAL DATA:  Cough, shortness of breath.  EXAM: PORTABLE CHEST - 1 VIEW  COMPARISON:  Same day.  FINDINGS: The heart size and mediastinal contours are within normal limits. Diffuse interstitial densities are noted throughout both lungs concerning for pulmonary edema. Nodular densities are noted in the right lung concerning for neoplasm or metastatic disease. The visualized skeletal structures are unremarkable.  IMPRESSION: Diffuse interstitial densities are noted throughout both lungs concerning for central pulmonary vascular congestion and pulmonary edema. Nodular densities are noted in the right  lung concerning for possible neoplasm or metastatic disease. CT scan of the chest is recommended for further evaluation.   Electronically Signed   By: Marijo Conception, M.D.   On: 03/17/2014 16:18    Scheduled Meds: . aspirin EC  81 mg Oral QHS  . azithromycin  500 mg Intravenous Q24H  . busPIRone  15 mg Oral BID  . cefTRIAXone (ROCEPHIN)  IV  1 g Intravenous Q24H  . DULoxetine  60 mg Oral BID  . enoxaparin (LOVENOX) injection  40 mg Subcutaneous Q24H  . guaiFENesin  1,200 mg Oral BID  . ipratropium  0.5 mg Nebulization Q4H  . levalbuterol  0.63 mg Nebulization Q4H  . levothyroxine  25 mcg Oral QAC breakfast  . methylPREDNISolone (SOLU-MEDROL) injection  60 mg Intravenous Q12H  . pantoprazole  80 mg Oral Daily   Continuous Infusions: . sodium chloride 100 mL/hr at 03/19/14 0600     Principal Problem:   Pneumonia Active Problems:   Hypothyroid   Tobacco abuse   Chronic pain   Sepsis   CAP (community acquired pneumonia)   COPD exacerbation   Acute kidney injury   Acute respiratory failure with hypoxia    Time spent: 65mins    Raeden Schippers  [facility] Pager [phone]. If 7PM-7AM, please contact night-coverage at [email or web address], password [facility] 03/19/2014, 10:29 AM  LOS: 2 days

## 2014-03-20 LAB — BASIC METABOLIC PANEL
Anion gap: 4 — ABNORMAL LOW (ref 5–15)
BUN: 10 mg/dL (ref 6–23)
CO2: 29 mmol/L (ref 19–32)
Calcium: 9 mg/dL (ref 8.4–10.5)
Chloride: 103 mmol/L (ref 96–112)
Creatinine, Ser: 0.74 mg/dL (ref 0.50–1.10)
GFR calc non Af Amer: >90 mL/min (ref >90)
Glucose, Bld: 134 mg/dL — ABNORMAL HIGH (ref 70–99)
POTASSIUM: 3.8 mmol/L (ref 3.5–5.1)
SODIUM: 136 mmol/L (ref 135–145)

## 2014-03-20 MED ORDER — AZITHROMYCIN 250 MG PO TABS
500.0000 mg | ORAL_TABLET | ORAL | Status: DC
  Administered 2014-03-20 – 2014-03-21 (×2): 500 mg via ORAL
  Filled 2014-03-20 (×2): qty 2

## 2014-03-20 MED ORDER — IPRATROPIUM BROMIDE 0.02 % IN SOLN
0.5000 mg | RESPIRATORY_TRACT | Status: DC
  Administered 2014-03-20 – 2014-03-22 (×11): 0.5 mg via RESPIRATORY_TRACT
  Filled 2014-03-20 (×13): qty 2.5

## 2014-03-20 MED ORDER — LEVALBUTEROL HCL 0.63 MG/3ML IN NEBU
0.6300 mg | INHALATION_SOLUTION | RESPIRATORY_TRACT | Status: DC
  Administered 2014-03-20 – 2014-03-22 (×11): 0.63 mg via RESPIRATORY_TRACT
  Filled 2014-03-20 (×13): qty 3

## 2014-03-20 MED ORDER — METHYLPREDNISOLONE SODIUM SUCC 125 MG IJ SOLR
60.0000 mg | Freq: Four times a day (QID) | INTRAMUSCULAR | Status: DC
  Administered 2014-03-20 – 2014-03-22 (×8): 60 mg via INTRAVENOUS
  Filled 2014-03-20 (×8): qty 2

## 2014-03-20 NOTE — Progress Notes (Signed)
[facility] PROGRESS NOTE  [name] JIR:[id number] DOB: [birth year] DOA: 03/17/2014 PCP: [place]  Assessment/Plan: 1. Acute respiratory failure. Felt to be related to underlying pneumonia and COPD. Will wean down oxygen as tolerated. 2. Sepsis. Possibly related to pneumonia. Blood pressure appears to be stabilizing. She is afebrile. Continue antibiotics. Blood cultures have shown no growth. 3. Community acquired Pneumonia. Continue on rocephin and azithromycin. Continue pulmonary hygiene. Urine strep antigen negative. 4. COPD exacerbation. Continue bronchodilators and antibiotics. Will increase IV steroids and she feels more short of breath and has worsening wheezes today. 5. Diarrhea. Stool for C. Difficile has been ordered needs to be collected. Diarrhea appears to have improved 6. Acute kidney injury. Likely related to dehydration and sepsis. Improved with IV fluids. 7. Tobacco use. Patient declines nicotine patch.  Code Status: full code Family Communication: discussed with patient and family at the bedside Disposition Plan: discharge home once improve   Consultants:    Procedures:    Antibiotics:  Vancomycin 2/11>>2/12  Cefepime 2/11>>2/12  Azithro 2/11>>  Rocephin 2/12>>  HPI/Subjective: Feeling more short of breath today. Had coughing spell this morning. Feels weak.  Objective: Filed Vitals:   03/20/14 1446  BP: 129/78  Pulse: 90  Temp: 97.6 F (36.4 C)  Resp: 20    Intake/Output Summary (Last 24 hours) at 03/20/14 1813 Last data filed at 03/20/14 1330  Gross per 24 hour  Intake    480 ml  Output      0 ml  Net    480 ml   Filed Weights   03/17/14 2117 03/18/14 0508 03/19/14 0500  Weight: 59.6 kg (131 lb 6.3 oz) 60.3 kg (132 lb 15 oz) 62.8 kg (138 lb 7.2 oz)    Exam:   General:  NAD  Cardiovascular: S1, s2 RRR  Respiratory: bilateral exp wheezing  Abdomen: soft, nt, nd, bs+  Musculoskeletal: no edema b/l    Data Reviewed: Basic Metabolic Panel:  Recent Labs Lab 03/17/14 1533 03/18/14 0439 03/19/14 0521 03/20/14 0622  NA 130* 135 138 136  K 3.2* 4.4 3.9 3.8  CL 96 108 107 103  CO2 24 21 24 29   GLUCOSE 140* 149* 136* 134*  BUN 16 11 8 10   CREATININE 1.60* 0.94 0.74 0.74  CALCIUM 8.6 8.2* 8.8 9.0   Liver Function Tests:  Recent Labs Lab 03/17/14 1533 03/18/14 0439  AST 48* 42*  ALT 22 24  ALKPHOS 60 45  BILITOT 0.5 0.3  PROT 7.2 5.7*  ALBUMIN 3.5 2.8*   No results for input(s): LIPASE, AMYLASE in the last 168 hours. No results for input(s): AMMONIA in the last 168 hours. CBC:  Recent Labs Lab 03/17/14 1533 03/18/14 0439 03/19/14 0521  WBC 9.4 4.5 13.2*  NEUTROABS 7.3  --   --   HGB 13.1 10.5* 10.7*  HCT 38.1 31.5* 32.4*  MCV 92.3 94.0 94.2  PLT 199 188 213   Cardiac Enzymes: No results for input(s): CKTOTAL, CKMB, CKMBINDEX, TROPONINI in the last 168 hours. BNP (last 3 results)  Recent Labs  03/17/14 1533  BNP 66.0    ProBNP (last 3 results) No results for input(s): PROBNP in the last 8760 hours.  CBG: No results for input(s): GLUCAP in the last 168 hours.  Recent Results (from the past 240 hour(s))  Blood culture (routine x 2)     Status: None (Preliminary result)   Collection Time: 03/17/14  3:38 PM  Result Value Ref Range Status  Specimen Description BLOOD RIGHT ANTECUBITAL  Final   Special Requests BOTTLES DRAWN AEROBIC ONLY 5CC  Final   Culture NO GROWTH 3 DAYS  Final   Report Status PENDING  Incomplete  Blood culture (routine x 2)     Status: None (Preliminary result)   Collection Time: 03/17/14  4:25 PM  Result Value Ref Range Status   Specimen Description BLOOD LEFT ARM  Final   Special Requests BOTTLES DRAWN AEROBIC AND ANAEROBIC 6CC  Final   Culture NO GROWTH 3 DAYS  Final   Report Status PENDING  Incomplete  MRSA PCR Screening     Status: None   Collection Time: 03/17/14  9:08 PM  Result Value Ref Range Status   MRSA by PCR  NEGATIVE NEGATIVE Final    Comment:        The GeneXpert MRSA Assay (FDA approved for NASAL specimens only), is one component of a comprehensive MRSA colonization surveillance program. It is not intended to diagnose MRSA infection nor to guide or monitor treatment for MRSA infections.      Studies: No results found.  Scheduled Meds: . aspirin EC  81 mg Oral QHS  . azithromycin  500 mg Oral Q24H  . busPIRone  15 mg Oral BID  . cefTRIAXone (ROCEPHIN)  IV  1 g Intravenous Q24H  . DULoxetine  60 mg Oral BID  . guaiFENesin  1,200 mg Oral BID  . ipratropium  0.5 mg Nebulization Q4H WA  . levalbuterol  0.63 mg Nebulization Q4H WA  . levothyroxine  25 mcg Oral QAC breakfast  . methylPREDNISolone (SOLU-MEDROL) injection  60 mg Intravenous Q6H  . pantoprazole  80 mg Oral Daily   Continuous Infusions: . sodium chloride 10 mL/hr at 03/19/14 2005    Principal Problem:   Pneumonia Active Problems:   Hypothyroid   Tobacco abuse   Chronic pain   Sepsis   CAP (community acquired pneumonia)   COPD exacerbation   Acute kidney injury   Acute respiratory failure with hypoxia    Time spent: 10mins    MEMON,JEHANZEB  [facility] Pager [phone]. If 7PM-7AM, please contact night-coverage at [email or web address], password [facility] 03/20/2014, 6:13 PM  LOS: 3 days

## 2014-03-20 NOTE — Progress Notes (Signed)
PHARMACIST - PHYSICIAN COMMUNICATION DR:   [facility] CONCERNING: Antibiotic IV to Oral Route Change Policy  RECOMMENDATION: This patient is receiving Zithromax by the intravenous route.  Based on criteria approved by the Pharmacy and Therapeutics Committee, the antibiotic(s) is/are being converted to the equivalent oral dose form(s).   DESCRIPTION: These criteria include:  Patient being treated for a respiratory tract infection, urinary tract infection, cellulitis or clostridium difficile associated diarrhea if on metronidazole  The patient is not neutropenic and does not exhibit a GI malabsorption state  The patient is eating (either orally or via tube) and/or has been taking other orally administered medications for a least 24 hours  The patient is improving clinically and has a Tmax < 100.5  If you have questions about this conversion, please contact the Pharmacy Department  [x]   [phone] )  [facility] []   [phone] )  [facility]  []   [phone] )  [facility] []   [phone] )  [place], [facility] 03/20/2014 12:16 PM

## 2014-03-20 NOTE — Progress Notes (Signed)
Patient did not wish to be awakened at 2300 neb tx

## 2014-03-21 DIAGNOSIS — A047 Enterocolitis due to Clostridium difficile: Secondary | ICD-10-CM

## 2014-03-21 LAB — BASIC METABOLIC PANEL
ANION GAP: 10 (ref 5–15)
BUN: 8 mg/dL (ref 6–23)
CO2: 35 mmol/L — AB (ref 19–32)
CREATININE: 0.63 mg/dL (ref 0.50–1.10)
Calcium: 9 mg/dL (ref 8.4–10.5)
Chloride: 96 mmol/L (ref 96–112)
GFR calc Af Amer: >90 mL/min (ref >90)
Glucose, Bld: 156 mg/dL — ABNORMAL HIGH (ref 70–99)
Potassium: 3 mmol/L — ABNORMAL LOW (ref 3.5–5.1)
SODIUM: 141 mmol/L (ref 135–145)

## 2014-03-21 LAB — CLOSTRIDIUM DIFFICILE BY PCR: Toxigenic C. Difficile by PCR: POSITIVE — AB

## 2014-03-21 LAB — CBC
HCT: 33.1 % — ABNORMAL LOW (ref 36.0–46.0)
Hemoglobin: 11.1 g/dL — ABNORMAL LOW (ref 12.0–15.0)
MCH: 31 pg (ref 26.0–34.0)
MCHC: 33.5 g/dL (ref 30.0–36.0)
MCV: 92.5 fL (ref 78.0–100.0)
Platelets: 302 10*3/uL (ref 150–400)
RBC: 3.58 MIL/uL — AB (ref 3.87–5.11)
RDW: 12.9 % (ref 11.5–15.5)
WBC: 8.3 10*3/uL (ref 4.0–10.5)

## 2014-03-21 LAB — LEGIONELLA ANTIGEN, URINE

## 2014-03-21 MED ORDER — SACCHAROMYCES BOULARDII 250 MG PO CAPS
250.0000 mg | ORAL_CAPSULE | Freq: Two times a day (BID) | ORAL | Status: DC
  Administered 2014-03-21 – 2014-03-22 (×2): 250 mg via ORAL
  Filled 2014-03-21 (×2): qty 1

## 2014-03-21 MED ORDER — POTASSIUM CHLORIDE CRYS ER 20 MEQ PO TBCR
40.0000 meq | EXTENDED_RELEASE_TABLET | ORAL | Status: AC
  Administered 2014-03-21 (×2): 40 meq via ORAL
  Filled 2014-03-21 (×2): qty 2

## 2014-03-21 MED ORDER — METRONIDAZOLE 500 MG PO TABS
500.0000 mg | ORAL_TABLET | Freq: Three times a day (TID) | ORAL | Status: DC
  Administered 2014-03-21 – 2014-03-22 (×3): 500 mg via ORAL
  Filled 2014-03-21 (×3): qty 1

## 2014-03-21 NOTE — Progress Notes (Signed)
[facility] PROGRESS NOTE  TOSHIKO KEMLER VCB:[id number] DOB: 06-08-1955 DOA: 03/17/2014 PCP: [place]  Assessment/Plan: 1. Acute respiratory failure. Felt to be related to underlying pneumonia and COPD. Will wean down oxygen as tolerated. 2. Sepsis. Possibly related to pneumonia. Blood pressure appears to be stabilizing. She is afebrile. Continue antibiotics. Blood cultures have shown no growth. 3. Community acquired Pneumonia. Continue on rocephin and azithromycin. Continue pulmonary hygiene. Urine strep antigen and Legionella antigen negative. 4. COPD exacerbation. Continue bronchodilators and antibiotics. Possibly transition to oral prednisone tomorrow. Continue current treatments for now.. 5. Diarrhea. Patient was having diarrhea prior to admission. Stool C. difficile was ordered on admission. This was eventually sent today and returned positive. She's been started on appropriate therapy with Flagyl. 6. Acute kidney injury. Likely related to dehydration and sepsis. Improved with IV fluids. 7. Tobacco use. Patient declines nicotine patch.   Code Status: full code Family Communication: discussed with patient and family at the bedside Disposition Plan: discharge home once improve   Consultants:    Procedures:    Antibiotics:  Vancomycin 2/11>>2/12  Cefepime 2/11>>2/12  Azithro 2/11>>  Rocephin 2/12>>  HPI/Subjective: Feeling better today. Sitting up in chair. Was able to ambulate in the hall. Still somewhat short of breath and wheezing  Objective: Filed Vitals:   03/21/14 1447  BP: 126/71  Pulse: 91  Temp: 92.2 F (33.4 C)  Resp: 20    Intake/Output Summary (Last 24 hours) at 03/21/14 1858 Last data filed at 03/21/14 1446  Gross per 24 hour  Intake    360 ml  Output      4 ml  Net    356 ml   Filed Weights   03/17/14 2117 03/18/14 0508 03/19/14 0500  Weight: 59.6 kg (131 lb 6.3 oz) 60.3 kg (132 lb 15 oz) 62.8 kg (138 lb 7.2 oz)     Exam:   General:  NAD  Cardiovascular: S1, s2 RRR  Respiratory: bilateral exp wheezing  Abdomen: soft, nt, nd, bs+  Musculoskeletal: no edema b/l   Data Reviewed: Basic Metabolic Panel:  Recent Labs Lab 03/17/14 1533 03/18/14 0439 03/19/14 0521 03/20/14 0622 03/21/14 0552  NA 130* 135 138 136 141  K 3.2* 4.4 3.9 3.8 3.0*  CL 96 108 107 103 96  CO2 24 21 24 29  35*  GLUCOSE 140* 149* 136* 134* 156*  BUN 16 11 8 10 8   CREATININE 1.60* 0.94 0.74 0.74 0.63  CALCIUM 8.6 8.2* 8.8 9.0 9.0   Liver Function Tests:  Recent Labs Lab 03/17/14 1533 03/18/14 0439  AST 48* 42*  ALT 22 24  ALKPHOS 60 45  BILITOT 0.5 0.3  PROT 7.2 5.7*  ALBUMIN 3.5 2.8*   No results for input(s): LIPASE, AMYLASE in the last 168 hours. No results for input(s): AMMONIA in the last 168 hours. CBC:  Recent Labs Lab 03/17/14 1533 03/18/14 0439 03/19/14 0521 03/21/14 0552  WBC 9.4 4.5 13.2* 8.3  NEUTROABS 7.3  --   --   --   HGB 13.1 10.5* 10.7* 11.1*  HCT 38.1 31.5* 32.4* 33.1*  MCV 92.3 94.0 94.2 92.5  PLT 199 188 213 302   Cardiac Enzymes: No results for input(s): CKTOTAL, CKMB, CKMBINDEX, TROPONINI in the last 168 hours. BNP (last 3 results)  Recent Labs  03/17/14 1533  BNP 66.0    ProBNP (last 3 results) No results for input(s): PROBNP in the last 8760 hours.  CBG: No results for input(s): GLUCAP in the  last 168 hours.  Recent Results (from the past 240 hour(s))  Blood culture (routine x 2)     Status: None (Preliminary result)   Collection Time: 03/17/14  3:38 PM  Result Value Ref Range Status   Specimen Description BLOOD RIGHT ANTECUBITAL  Final   Special Requests BOTTLES DRAWN AEROBIC ONLY 5CC  Final   Culture NO GROWTH 3 DAYS  Final   Report Status PENDING  Incomplete  Blood culture (routine x 2)     Status: None (Preliminary result)   Collection Time: 03/17/14  4:25 PM  Result Value Ref Range Status   Specimen Description BLOOD LEFT ARM  Final    Special Requests BOTTLES DRAWN AEROBIC AND ANAEROBIC 6CC  Final   Culture NO GROWTH 3 DAYS  Final   Report Status PENDING  Incomplete  MRSA PCR Screening     Status: None   Collection Time: 03/17/14  9:08 PM  Result Value Ref Range Status   MRSA by PCR NEGATIVE NEGATIVE Final    Comment:        The GeneXpert MRSA Assay (FDA approved for NASAL specimens only), is one component of a comprehensive MRSA colonization surveillance program. It is not intended to diagnose MRSA infection nor to guide or monitor treatment for MRSA infections.   Clostridium Difficile by PCR     Status: Abnormal   Collection Time: 03/21/14 11:10 AM  Result Value Ref Range Status   C difficile by pcr POSITIVE (A) NEGATIVE Final    Comment: CRITICAL RESULT CALLED TO, READ BACK BY AND VERIFIED WITH: WATKINS T. AT 1225P ON [id number] BY THOMPSON S.      Studies: No results found.  Scheduled Meds: . aspirin EC  81 mg Oral QHS  . azithromycin  500 mg Oral Q24H  . busPIRone  15 mg Oral BID  . cefTRIAXone (ROCEPHIN)  IV  1 g Intravenous Q24H  . DULoxetine  60 mg Oral BID  . guaiFENesin  1,200 mg Oral BID  . ipratropium  0.5 mg Nebulization Q4H WA  . levalbuterol  0.63 mg Nebulization Q4H WA  . levothyroxine  25 mcg Oral QAC breakfast  . methylPREDNISolone (SOLU-MEDROL) injection  60 mg Intravenous Q6H  . metroNIDAZOLE  500 mg Oral 3 times per day  . pantoprazole  80 mg Oral Daily  . potassium chloride  40 mEq Oral Q3H   Continuous Infusions: . sodium chloride 10 mL/hr at 03/19/14 2005    Principal Problem:   Pneumonia Active Problems:   Hypothyroid   Tobacco abuse   Chronic pain   Sepsis   CAP (community acquired pneumonia)   COPD exacerbation   Acute kidney injury   Acute respiratory failure with hypoxia    Time spent: 59mins    Graig Hessling  [facility] Pager [phone]. If 7PM-7AM, please contact night-coverage at [email or web address], password [facility] 03/21/2014, 6:58 PM  LOS: 4 days

## 2014-03-21 NOTE — Progress Notes (Signed)
PT Cancellation Note  Patient Details Name: [name] MRN: [id number] DOB: 1955-11-05   Cancelled Treatment:    Reason Eval/Treat Not Completed: PT screened, no needs identified, will sign off   Sable Feil 03/21/2014, 8:38 AM

## 2014-03-21 NOTE — Progress Notes (Signed)
Patient not going to use CPAP tonight. Hospital unit still in room at bedside.

## 2014-03-21 NOTE — Progress Notes (Signed)
UR chart review completed.  

## 2014-03-22 LAB — CBC
HEMATOCRIT: 36.9 % (ref 36.0–46.0)
Hemoglobin: 12.5 g/dL (ref 12.0–15.0)
MCH: 31.3 pg (ref 26.0–34.0)
MCHC: 33.9 g/dL (ref 30.0–36.0)
MCV: 92.3 fL (ref 78.0–100.0)
PLATELETS: 414 10*3/uL — AB (ref 150–400)
RBC: 4 MIL/uL (ref 3.87–5.11)
RDW: 12.8 % (ref 11.5–15.5)
WBC: 11 10*3/uL — AB (ref 4.0–10.5)

## 2014-03-22 LAB — BASIC METABOLIC PANEL
Anion gap: 11 (ref 5–15)
BUN: 11 mg/dL (ref 6–23)
CALCIUM: 9.2 mg/dL (ref 8.4–10.5)
CHLORIDE: 95 mmol/L — AB (ref 96–112)
CO2: 34 mmol/L — ABNORMAL HIGH (ref 19–32)
Creatinine, Ser: 0.7 mg/dL (ref 0.50–1.10)
GFR calc Af Amer: >90 mL/min (ref >90)
GFR calc non Af Amer: >90 mL/min (ref >90)
Glucose, Bld: 160 mg/dL — ABNORMAL HIGH (ref 70–99)
POTASSIUM: 3.3 mmol/L — AB (ref 3.5–5.1)
SODIUM: 140 mmol/L (ref 135–145)

## 2014-03-22 MED ORDER — AZITHROMYCIN 500 MG PO TABS: 500.0000 mg | ORAL_TABLET | Freq: Every day | ORAL | Status: DC

## 2014-03-22 MED ORDER — SACCHAROMYCES BOULARDII 250 MG PO CAPS
250.0000 mg | ORAL_CAPSULE | Freq: Two times a day (BID) | ORAL | Status: DC
Start: 2014-03-22 — End: 2018-08-27

## 2014-03-22 MED ORDER — PREDNISONE 10 MG PO TABS: ORAL_TABLET | ORAL | Status: DC

## 2014-03-22 MED ORDER — TIOTROPIUM BROMIDE MONOHYDRATE 18 MCG IN CAPS: 18.0000 ug | ORAL_CAPSULE | Freq: Every day | RESPIRATORY_TRACT | Status: DC

## 2014-03-22 MED ORDER — CEFUROXIME AXETIL 500 MG PO TABS: 500.0000 mg | ORAL_TABLET | Freq: Two times a day (BID) | ORAL | Status: DC

## 2014-03-22 MED ORDER — POTASSIUM CHLORIDE CRYS ER 20 MEQ PO TBCR: 40.0000 meq | EXTENDED_RELEASE_TABLET | Freq: Once | ORAL | Status: DC

## 2014-03-22 MED ORDER — METRONIDAZOLE 500 MG PO TABS
500.0000 mg | ORAL_TABLET | Freq: Three times a day (TID) | ORAL | Status: DC
Start: 2014-03-22 — End: 2018-08-25

## 2014-03-22 MED ORDER — ALBUTEROL SULFATE (2.5 MG/3ML) 0.083% IN NEBU: 2.5000 mg | INHALATION_SOLUTION | Freq: Four times a day (QID) | RESPIRATORY_TRACT | Status: DC | PRN

## 2014-03-22 MED ORDER — GUAIFENESIN ER 600 MG PO TB12: 600.0000 mg | ORAL_TABLET | Freq: Two times a day (BID) | ORAL | Status: DC

## 2014-03-22 NOTE — Progress Notes (Signed)
SATURATION QUALIFICATIONS: (This note is used to comply with regulatory documentation for home oxygen)  Patient Saturations on Room Air at Rest = 92%  Patient Saturations on Room Air while Ambulating = 93%  Patient Saturations on 2 Liters of oxygen while Ambulating = 94%  Please briefly explain why patient needs home oxygen: Saturation ok while ambulating and at rest without O2

## 2014-03-22 NOTE — Clinical Documentation Improvement (Deleted)
  Patient's potassium level was low on 03/21/14 at 3.0 mmol/L.  She received K-Dur 40 mEq PO for 2 doses on 03/21/14.  Potassium level remains low on 03/22/14 at 3.3 mmol/L.    Possible Clinical Conditions?     Hypokalemia                                Other Condition __________                Cannot Clinically Determine   Thank You,  Posey Pronto, RN, BSN, [employer] Clinical Documentation Improvement Specialist HIM department--[place] Office [phone]

## 2014-03-22 NOTE — Progress Notes (Signed)
Reviewed discharge instructions and prescriptions with pt and family.  IV removed per pt once told they were discharging.  IV site clean/dry.  Will continue to monitor pt until they leave the floor.

## 2014-03-22 NOTE — Discharge Summary (Signed)
Physician Discharge Summary  [name] SWN:[id number] DOB: [birth year] DOA: 03/17/2014  PCP: Mylo date: 03/17/2014 Discharge date: 03/22/2014  Time spent: 40 minutes  Recommendations for Outpatient Follow-up:  1. Follow-up with primary care physician a 1-2 weeks  Discharge Diagnoses:  Principal Problem:   Pneumonia Active Problems:   Hypothyroid   Tobacco abuse   Chronic pain   Sepsis   CAP (community acquired pneumonia)   COPD exacerbation   Acute kidney injury   Acute respiratory failure with hypoxia Clostridium difficile enteritis  Discharge Condition: improved  Diet recommendation: low salt  Filed Weights   03/17/14 2117 03/18/14 0508 03/19/14 0500  Weight: 59.6 kg (131 lb 6.3 oz) 60.3 kg (132 lb 15 oz) 62.8 kg (138 lb 7.2 oz)    History of present illness:  Patient presents to the hospital with progressive shortness of breath. She was found to have community-acquired pneumonia as well as COPD exacerbation. CT angiogram of the chest was negative for pulmonary embolus. She was started on appropriate antibiotics and admitted to the stepdown unit.  Hospital Course:  Patient was continued on antibiotics, bronchodilators and steroids. Her respiratory status slowly improved. She is now ambulating on room air without significant difficulty. She was placed on a prednisone taper as well as oral antibiotics to complete our records. Urine Legionella and strep antigens were found to be negative. She'll be continued on bronchodilators via nebulizer at home. She was counseled on the importance of tobacco cessation.  The patient was having diarrhea prior to admission. Stool C. difficile was ordered on admission but was sent by staff several days later. This returned positive for Clostridium difficile. Patient was no longer having frequent stools. She reported one bowel movement per day. She was placed on a course of Flagyl to complete as well as  probiotics.  Procedures:    Consultations:    Discharge Exam: Filed Vitals:   03/22/14 0635  BP: 133/71  Pulse: 97  Temp: 98.3 F (36.8 C)  Resp: 20    General: NAD Cardiovascular: S1, S2 RRR Respiratory: CTA B  Discharge Instructions   Discharge Instructions    Call MD for:  difficulty breathing, headache or visual disturbances    Complete by:  As directed      Call MD for:  temperature >100.4    Complete by:  As directed      Diet - low sodium heart healthy    Complete by:  As directed      Face-to-face encounter (required for Medicare/Medicaid patients)    Complete by:  As directed   I Dennice Tindol certify that this patient is under my care and that I, or a nurse practitioner or physician's assistant working with me, had a face-to-face encounter that meets the physician face-to-face encounter requirements with this patient on 03/22/2014. The encounter with the patient was in whole, or in part for the following medical condition(s) which is the primary reason for home health care (List medical condition): copd exacerbation  The encounter with the patient was in whole, or in part, for the following medical condition, which is the primary reason for home health care:  copd exacerbation  I certify that, based on my findings, the following services are medically necessary home health services:  Nursing  Reason for Medically Necessary Home Health Services:  Skilled Nursing- Skilled Assessment/Observation  My clinical findings support the need for the above services:  Shortness of breath with activity  Further, I certify  that my clinical findings support that this patient is homebound due to:  Shortness of Breath with activity     Home Health    Complete by:  As directed   To provide the following care/treatments:  RN     Increase activity slowly    Complete by:  As directed           Discharge Medication List as of 03/22/2014  1:11 PM    START taking these medications    Details  albuterol (PROVENTIL) (2.5 MG/3ML) 0.083% nebulizer solution Take 3 mLs (2.5 mg total) by nebulization every 6 (six) hours as needed for wheezing or shortness of breath., Starting 03/22/2014, Until Discontinued, Print    azithromycin (ZITHROMAX) 500 MG tablet Take 1 tablet (500 mg total) by mouth daily., Starting 03/22/2014, Until Discontinued, Print    cefUROXime (CEFTIN) 500 MG tablet Take 1 tablet (500 mg total) by mouth 2 (two) times daily with a meal., Starting 03/22/2014, Until Discontinued, Print    guaiFENesin (MUCINEX) 600 MG 12 hr tablet Take 1 tablet (600 mg total) by mouth 2 (two) times daily., Starting 03/22/2014, Until Discontinued, Print    metroNIDAZOLE (FLAGYL) 500 MG tablet Take 1 tablet (500 mg total) by mouth every 8 (eight) hours., Starting 03/22/2014, Until Discontinued, Print    predniSONE (DELTASONE) 10 MG tablet Take 40mg  po daily for 2 days, then 30mg  po daily for 2 days, then 20mg  po daily for 2 days then 10mg  po daily for 2 days then stop, Print    saccharomyces boulardii (FLORASTOR) 250 MG capsule Take 1 capsule (250 mg total) by mouth 2 (two) times daily., Starting 03/22/2014, Until Discontinued, Print    tiotropium (SPIRIVA HANDIHALER) 18 MCG inhalation capsule Place 1 capsule (18 mcg total) into inhaler and inhale daily., Starting 03/22/2014, Until Discontinued, Print      CONTINUE these medications which have NOT CHANGED   Details  albuterol (PROAIR HFA) 108 (90 BASE) MCG/ACT inhaler Inhale 2 puffs into the lungs every 6 (six) hours as needed for wheezing or shortness of breath., Until Discontinued, Historical Med    ALPRAZolam (XANAX) 1 MG tablet Take 1 tablet (1 mg total) by mouth 3 (three) times daily as needed for anxiety., Starting 12/22/2013, Until Discontinued, Print    aspirin EC 81 MG tablet Take 81 mg by mouth at bedtime., Until Discontinued, Historical Med    Cholecalciferol (VITAMIN D PO) Take 1 tablet by mouth daily., Until Discontinued,  Historical Med    DULoxetine (CYMBALTA) 60 MG capsule Take 1 capsule (60 mg total) by mouth 2 (two) times daily., Starting 12/22/2013, Until Discontinued, Print    fluticasone (FLONASE) 50 MCG/ACT nasal spray Place 2 sprays into both nostrils daily. , Starting 11/26/2013, Until Discontinued, Historical Med    HYDROcodone-acetaminophen (NORCO) 10-325 MG per tablet Take 1 tablet by mouth every 6 (six) hours as needed for moderate pain., Until Discontinued, Historical Med    levothyroxine (LEVOTHROID) 25 MCG tablet Take 25 mcg by mouth every morning. , Until Discontinued, Historical Med    Potassium 99 MG TABS Take 99 mg by mouth daily., Until Discontinued, Historical Med    topiramate (TOPAMAX) 25 MG tablet Take 50 mg by mouth 2 (two) times daily. , Starting 11/26/2013, Until Discontinued, Historical Med    VITAMIN E PO Take 1 capsule by mouth daily., Until Discontinued, Historical Med      STOP taking these medications     diphenhydrAMINE (BENADRYL) 25 MG tablet      NEXIUM  40 MG capsule      busPIRone (BUSPAR) 15 MG tablet      lovastatin (MEVACOR) 20 MG tablet      tiZANidine (ZANAFLEX) 4 MG tablet      traMADol (ULTRAM) 50 MG tablet        Allergies  Allergen Reactions  . Acetaminophen-Codeine Nausea And Vomiting  . Aspirin Nausea And Vomiting    (can take coated aspirin)  . Celecoxib Other (See Comments)    "does the opposite of what it is suppose to do"  . Levaquin [Levofloxacin In D5w] Itching  . Lipitor [Atorvastatin] Other (See Comments)    Myalgias   . Sulfa Antibiotics Nausea And Vomiting  . Adhesive [Tape] Rash  . Latex Rash   Follow-up Information    Follow up with Alphia Kava. Schedule an appointment as soon as possible for a visit in 2 weeks.   Contact information:   [address] [place] [address] [phone]        The results of significant diagnostics from this hospitalization (including imaging, microbiology, ancillary  and laboratory) are listed below for reference.    Significant Diagnostic Studies: Ct Angio Chest Pe W/cm &/or Wo Cm  03/17/2014   CLINICAL DATA:  Cough and fever for 5 days  EXAM: CT ANGIOGRAPHY CHEST WITH CONTRAST  TECHNIQUE: Multidetector CT imaging of the chest was performed using the standard protocol during bolus administration of intravenous contrast. Multiplanar CT image reconstructions and MIPs were obtained to evaluate the vascular anatomy.  CONTRAST:  110mL OMNIPAQUE IOHEXOL 350 MG/ML SOLN  COMPARISON:  10/03/2008  FINDINGS: The lungs are well aerated bilaterally. Some mild emphysematous changes are seen. Patchy infiltrative changes are identified which corresponds to the nodule seen on prior chest x-ray. This is scattered within the right upper lobe as well as the lingula and lower lobes bilaterally. The most prominent focal infiltrate lies within the posterior aspect of the left lower lobe. There are some changes consistent with tree-in-bud appearance and this may represent an atypical pneumonia such as mycoplasma. No sizable effusion is seen. The hilar and mediastinal structures show only small lymph nodes in the hila bilaterally. None of these are significant by size criteria and are likely reactive in nature.  The thoracic aorta demonstrates some mild atherosclerotic change without aneurysmal dilatation or dissection. There is short segment occlusion of the left subclavian artery just beyond its origin with likely retrograde opacification via the left vertebral artery. These changes are chronic in nature from the prior exam.  The pulmonary artery shows no definitive pulmonary emboli although is incompletely opacified.  The visualized upper abdomen demonstrates evidence of prior granulomatous disease. The gallbladder has been surgically removed. The osseous structures are grossly unremarkable.  Review of the MIP images confirms the above findings.  IMPRESSION: Patchy infiltrative changes in the  lungs bilaterally consistent with an atypical pneumonia such as mycoplasma.  No definitive mass lesion is noted. The nodular changes on recent chest x-ray are secondary to the patchy infiltrates seen in both lungs.  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  No definitive pulmonary embolism is identified   Electronically Signed   By: Inez Catalina M.D.   On: 03/17/2014 18:17   Dg Chest Port 1 View  03/17/2014   CLINICAL DATA:  Cough, shortness of breath.  EXAM: PORTABLE CHEST - 1 VIEW  COMPARISON:  Same day.  FINDINGS: The heart size and mediastinal contours are within normal limits. Diffuse interstitial densities are  noted throughout both lungs concerning for pulmonary edema. Nodular densities are noted in the right lung concerning for neoplasm or metastatic disease. The visualized skeletal structures are unremarkable.  IMPRESSION: Diffuse interstitial densities are noted throughout both lungs concerning for central pulmonary vascular congestion and pulmonary edema. Nodular densities are noted in the right lung concerning for possible neoplasm or metastatic disease. CT scan of the chest is recommended for further evaluation.   Electronically Signed   By: Marijo Conception, M.D.   On: 03/17/2014 16:18    Microbiology: Recent Results (from the past 240 hour(s))  Blood culture (routine x 2)     Status: None (Preliminary result)   Collection Time: 03/17/14  3:38 PM  Result Value Ref Range Status   Specimen Description BLOOD RIGHT ANTECUBITAL  Final   Special Requests BOTTLES DRAWN AEROBIC ONLY 5CC  Final   Culture NO GROWTH 3 DAYS  Final   Report Status PENDING  Incomplete  Blood culture (routine x 2)     Status: None (Preliminary result)   Collection Time: 03/17/14  4:25 PM  Result Value Ref Range Status   Specimen Description BLOOD LEFT ARM  Final   Special Requests BOTTLES DRAWN AEROBIC AND ANAEROBIC 6CC  Final   Culture NO GROWTH 3 DAYS  Final   Report Status PENDING  Incomplete   MRSA PCR Screening     Status: None   Collection Time: 03/17/14  9:08 PM  Result Value Ref Range Status   MRSA by PCR NEGATIVE NEGATIVE Final    Comment:        The GeneXpert MRSA Assay (FDA approved for NASAL specimens only), is one component of a comprehensive MRSA colonization surveillance program. It is not intended to diagnose MRSA infection nor to guide or monitor treatment for MRSA infections.   Clostridium Difficile by PCR     Status: Abnormal   Collection Time: 03/21/14 11:10 AM  Result Value Ref Range Status   C difficile by pcr POSITIVE (A) NEGATIVE Final    Comment: CRITICAL RESULT CALLED TO, READ BACK BY AND VERIFIED WITH: WATKINS T. AT 1225P ON [id number] BY THOMPSON S.      Labs: Basic Metabolic Panel:  Recent Labs Lab 03/18/14 0439 03/19/14 0521 03/20/14 0622 03/21/14 0552 03/22/14 0621  NA 135 138 136 141 140  K 4.4 3.9 3.8 3.0* 3.3*  CL 108 107 103 96 95*  CO2 21 24 29  35* 34*  GLUCOSE 149* 136* 134* 156* 160*  BUN 11 8 10 8 11   CREATININE 0.94 0.74 0.74 0.63 0.70  CALCIUM 8.2* 8.8 9.0 9.0 9.2   Liver Function Tests:  Recent Labs Lab 03/17/14 1533 03/18/14 0439  AST 48* 42*  ALT 22 24  ALKPHOS 60 45  BILITOT 0.5 0.3  PROT 7.2 5.7*  ALBUMIN 3.5 2.8*   No results for input(s): LIPASE, AMYLASE in the last 168 hours. No results for input(s): AMMONIA in the last 168 hours. CBC:  Recent Labs Lab 03/17/14 1533 03/18/14 0439 03/19/14 0521 03/21/14 0552 03/22/14 0621  WBC 9.4 4.5 13.2* 8.3 11.0*  NEUTROABS 7.3  --   --   --   --   HGB 13.1 10.5* 10.7* 11.1* 12.5  HCT 38.1 31.5* 32.4* 33.1* 36.9  MCV 92.3 94.0 94.2 92.5 92.3  PLT 199 188 213 302 414*   Cardiac Enzymes: No results for input(s): CKTOTAL, CKMB, CKMBINDEX, TROPONINI in the last 168 hours. BNP: BNP (last 3 results)  Recent Labs  03/17/14 1533  BNP 66.0    ProBNP (last 3 results) No results for input(s): PROBNP in the last 8760 hours.  CBG: No results for  input(s): GLUCAP in the last 168 hours.     Signed:  Malaiyah Achorn  [facility] 03/22/2014, 8:37 PM

## 2014-03-23 LAB — CULTURE, BLOOD (ROUTINE X 2)
CULTURE: NO GROWTH
CULTURE: NO GROWTH

## 2014-03-23 NOTE — Care Management Utilization Note (Signed)
UR completed 

## 2014-05-06 ENCOUNTER — Ambulatory Visit: Payer: Medicare Other | Admitting: Family

## 2014-05-06 ENCOUNTER — Other Ambulatory Visit (HOSPITAL_COMMUNITY): Payer: 59

## 2014-06-02 ENCOUNTER — Encounter: Payer: Self-pay | Admitting: Family

## 2014-06-03 ENCOUNTER — Ambulatory Visit: Payer: Self-pay | Admitting: Family

## 2014-06-03 ENCOUNTER — Inpatient Hospital Stay (HOSPITAL_COMMUNITY): Admission: RE | Admit: 2014-06-03 | Payer: Medicaid Other | Source: Ambulatory Visit

## 2014-06-17 ENCOUNTER — Encounter: Payer: Self-pay | Admitting: Family

## 2014-06-21 ENCOUNTER — Ambulatory Visit: Payer: Medicare Other | Admitting: Family

## 2014-06-21 ENCOUNTER — Encounter (HOSPITAL_COMMUNITY): Payer: Medicare Other

## 2014-09-02 ENCOUNTER — Other Ambulatory Visit: Payer: Self-pay | Admitting: Vascular Surgery

## 2014-09-02 ENCOUNTER — Ambulatory Visit: Payer: Medicare Other | Admitting: Family

## 2014-09-02 ENCOUNTER — Ambulatory Visit (HOSPITAL_COMMUNITY)
Admission: RE | Admit: 2014-09-02 | Discharge: 2014-09-02 | Disposition: A | Discharge status: Home / Self Care | Payer: Medicare Other | Source: Ambulatory Visit | Attending: Vascular Surgery | Admitting: Vascular Surgery

## 2014-09-02 DIAGNOSIS — G458 Other transient cerebral ischemic attacks and related syndromes: Secondary | ICD-10-CM

## 2014-09-02 DIAGNOSIS — I6529 Occlusion and stenosis of unspecified carotid artery: Secondary | ICD-10-CM

## 2014-09-05 ENCOUNTER — Other Ambulatory Visit (HOSPITAL_COMMUNITY): Payer: Self-pay | Admitting: Nurse Practitioner

## 2014-09-05 DIAGNOSIS — N63 Unspecified lump in unspecified breast: Secondary | ICD-10-CM

## 2014-09-05 DIAGNOSIS — N644 Mastodynia: Secondary | ICD-10-CM

## 2014-09-08 ENCOUNTER — Other Ambulatory Visit: Payer: Self-pay | Admitting: Nurse Practitioner

## 2014-09-08 DIAGNOSIS — N644 Mastodynia: Secondary | ICD-10-CM

## 2014-09-08 DIAGNOSIS — N63 Unspecified lump in unspecified breast: Secondary | ICD-10-CM

## 2014-09-09 ENCOUNTER — Other Ambulatory Visit: Payer: Medicare Other

## 2014-09-09 ENCOUNTER — Inpatient Hospital Stay: Admission: RE | Admit: 2014-09-09 | Payer: Medicare Other | Source: Ambulatory Visit

## 2014-09-20 ENCOUNTER — Ambulatory Visit
Admission: RE | Admit: 2014-09-20 | Discharge: 2014-09-20 | Disposition: A | Discharge status: Home / Self Care | Payer: Medicare Other | Source: Ambulatory Visit | Attending: Nurse Practitioner | Admitting: Nurse Practitioner

## 2014-09-20 ENCOUNTER — Ambulatory Visit: Payer: Medicare Other

## 2014-09-20 DIAGNOSIS — Z1231 Encounter for screening mammogram for malignant neoplasm of breast: Secondary | ICD-10-CM

## 2014-10-18 ENCOUNTER — Encounter (HOSPITAL_COMMUNITY): Payer: Self-pay | Admitting: Emergency Medicine

## 2014-10-18 ENCOUNTER — Emergency Department (HOSPITAL_COMMUNITY): Payer: Medicare Other

## 2014-10-18 ENCOUNTER — Emergency Department (HOSPITAL_COMMUNITY)
Admission: EM | Admit: 2014-10-18 | Discharge: 2014-10-18 | Disposition: A | Discharge status: Home / Self Care | Payer: Medicare Other | Attending: Emergency Medicine | Admitting: Emergency Medicine

## 2014-10-18 DIAGNOSIS — I509 Heart failure, unspecified: Secondary | ICD-10-CM | POA: Diagnosis not present

## 2014-10-18 DIAGNOSIS — M779 Enthesopathy, unspecified: Secondary | ICD-10-CM | POA: Insufficient documentation

## 2014-10-18 DIAGNOSIS — I10 Essential (primary) hypertension: Secondary | ICD-10-CM | POA: Diagnosis not present

## 2014-10-18 DIAGNOSIS — E039 Hypothyroidism, unspecified: Secondary | ICD-10-CM | POA: Insufficient documentation

## 2014-10-18 DIAGNOSIS — Z862 Personal history of diseases of the blood and blood-forming organs and certain disorders involving the immune mechanism: Secondary | ICD-10-CM | POA: Insufficient documentation

## 2014-10-18 DIAGNOSIS — Z79899 Other long term (current) drug therapy: Secondary | ICD-10-CM | POA: Insufficient documentation

## 2014-10-18 DIAGNOSIS — Z91040 Latex allergy status: Secondary | ICD-10-CM | POA: Diagnosis not present

## 2014-10-18 DIAGNOSIS — Z9889 Other specified postprocedural states: Secondary | ICD-10-CM | POA: Insufficient documentation

## 2014-10-18 DIAGNOSIS — Z7951 Long term (current) use of inhaled steroids: Secondary | ICD-10-CM | POA: Insufficient documentation

## 2014-10-18 DIAGNOSIS — F419 Anxiety disorder, unspecified: Secondary | ICD-10-CM | POA: Diagnosis not present

## 2014-10-18 DIAGNOSIS — Z7982 Long term (current) use of aspirin: Secondary | ICD-10-CM | POA: Insufficient documentation

## 2014-10-18 DIAGNOSIS — M19041 Primary osteoarthritis, right hand: Secondary | ICD-10-CM | POA: Diagnosis not present

## 2014-10-18 DIAGNOSIS — Z8719 Personal history of other diseases of the digestive system: Secondary | ICD-10-CM | POA: Insufficient documentation

## 2014-10-18 DIAGNOSIS — G8929 Other chronic pain: Secondary | ICD-10-CM | POA: Diagnosis not present

## 2014-10-18 DIAGNOSIS — Z792 Long term (current) use of antibiotics: Secondary | ICD-10-CM | POA: Diagnosis not present

## 2014-10-18 DIAGNOSIS — F329 Major depressive disorder, single episode, unspecified: Secondary | ICD-10-CM | POA: Diagnosis not present

## 2014-10-18 DIAGNOSIS — Z8542 Personal history of malignant neoplasm of other parts of uterus: Secondary | ICD-10-CM | POA: Insufficient documentation

## 2014-10-18 DIAGNOSIS — G43909 Migraine, unspecified, not intractable, without status migrainosus: Secondary | ICD-10-CM | POA: Diagnosis not present

## 2014-10-18 DIAGNOSIS — Z8701 Personal history of pneumonia (recurrent): Secondary | ICD-10-CM | POA: Diagnosis not present

## 2014-10-18 DIAGNOSIS — Z8709 Personal history of other diseases of the respiratory system: Secondary | ICD-10-CM | POA: Insufficient documentation

## 2014-10-18 DIAGNOSIS — Z720 Tobacco use: Secondary | ICD-10-CM | POA: Diagnosis not present

## 2014-10-18 DIAGNOSIS — M778 Other enthesopathies, not elsewhere classified: Secondary | ICD-10-CM

## 2014-10-18 DIAGNOSIS — M79641 Pain in right hand: Secondary | ICD-10-CM | POA: Diagnosis present

## 2014-10-18 MED ORDER — HYDROCODONE-ACETAMINOPHEN 7.5-325 MG PO TABS: 1.0000 | ORAL_TABLET | Freq: Four times a day (QID) | ORAL | Status: DC | PRN

## 2014-10-18 MED ORDER — PREDNISONE 10 MG PO TABS: ORAL_TABLET | ORAL | Status: DC

## 2014-10-18 MED ORDER — HYDROCODONE-ACETAMINOPHEN 5-325 MG PO TABS
1.0000 | ORAL_TABLET | Freq: Once | ORAL | Status: AC
  Administered 2014-10-18: 1 via ORAL
  Filled 2014-10-18: qty 1

## 2014-10-18 NOTE — ED Notes (Signed)
Patient given discharge instruction, verbalized understand. Patient ambulatory out of the department.  

## 2014-10-18 NOTE — Discharge Instructions (Signed)
Arthritis, Nonspecific °Arthritis is inflammation of a joint. This usually means pain, redness, warmth or swelling are present. One or more joints may be involved. There are a number of types of arthritis. Your caregiver may not be able to tell what type of arthritis you have right away. °CAUSES  °The most common cause of arthritis is the wear and tear on the joint (osteoarthritis). This causes damage to the cartilage, which can break down over time. The knees, hips, back and neck are most often affected by this type of arthritis. °Other types of arthritis and common causes of joint pain include: °· Sprains and other injuries near the joint. Sometimes minor sprains and injuries cause pain and swelling that develop hours later. °· Rheumatoid arthritis. This affects hands, feet and knees. It usually affects both sides of your body at the same time. It is often associated with chronic ailments, fever, weight loss and general weakness. °· Crystal arthritis. Gout and pseudo gout can cause occasional acute severe pain, redness and swelling in the foot, ankle, or knee. °· Infectious arthritis. Bacteria can get into a joint through a break in overlying skin. This can cause infection of the joint. Bacteria and viruses can also spread through the blood and affect your joints. °· Drug, infectious and allergy reactions. Sometimes joints can become mildly painful and slightly swollen with these types of illnesses. °SYMPTOMS  °· Pain is the main symptom. °· Your joint or joints can also be red, swollen and warm or hot to the touch. °· You may have a fever with certain types of arthritis, or even feel overall ill. °· The joint with arthritis will hurt with movement. Stiffness is present with some types of arthritis. °DIAGNOSIS  °Your caregiver will suspect arthritis based on your description of your symptoms and on your exam. Testing may be needed to find the type of arthritis: °· Blood and sometimes urine tests. °· X-ray tests  and sometimes CT or MRI scans. °· Removal of fluid from the joint (arthrocentesis) is done to check for bacteria, crystals or other causes. Your caregiver (or a specialist) will numb the area over the joint with a local anesthetic, and use a needle to remove joint fluid for examination. This procedure is only minimally uncomfortable. °· Even with these tests, your caregiver may not be able to tell what kind of arthritis you have. Consultation with a specialist (rheumatologist) may be helpful. °TREATMENT  °Your caregiver will discuss with you treatment specific to your type of arthritis. If the specific type cannot be determined, then the following general recommendations may apply. °Treatment of severe joint pain includes: °· Rest. °· Elevation. °· Anti-inflammatory medication (for example, ibuprofen) may be prescribed. Avoiding activities that cause increased pain. °· Only take over-the-counter or prescription medicines for pain and discomfort as recommended by your caregiver. °· Cold packs over an inflamed joint may be used for 10 to 15 minutes every hour. Hot packs sometimes feel better, but do not use overnight. Do not use hot packs if you are diabetic without your caregiver's permission. °· A cortisone shot into arthritic joints may help reduce pain and swelling. °· Any acute arthritis that gets worse over the next 1 to 2 days needs to be looked at to be sure there is no joint infection. °Long-term arthritis treatment involves modifying activities and lifestyle to reduce joint stress jarring. This can include weight loss. Also, exercise is needed to nourish the joint cartilage and remove waste. This helps keep the muscles   around the joint strong. HOME CARE INSTRUCTIONS   Do not take aspirin to relieve pain if gout is suspected. This elevates uric acid levels.  Only take over-the-counter or prescription medicines for pain, discomfort or fever as directed by your caregiver.  Rest the joint as much as  possible.  If your joint is swollen, keep it elevated.  Use crutches if the painful joint is in your leg.  Drinking plenty of fluids may help for certain types of arthritis.  Follow your caregiver's dietary instructions.  Try low-impact exercise such as:  Swimming.  Water aerobics.  Biking.  Walking.  Morning stiffness is often relieved by a warm shower.  Put your joints through regular range-of-motion. SEEK MEDICAL CARE IF:   You do not feel better in 24 hours or are getting worse.  You have side effects to medications, or are not getting better with treatment. SEEK IMMEDIATE MEDICAL CARE IF:   You have a fever.  You develop severe joint pain, swelling or redness.  Many joints are involved and become painful and swollen.  There is severe back pain and/or leg weakness.  You have loss of bowel or bladder control. Document Released: 02/29/2004 Document Revised: 04/15/2011 Document Reviewed: 03/16/2008 [facility] Patient Information 2015 [place], [place]. This information is not intended to replace advice given to you by your health care provider. Make sure you discuss any questions you have with your health care provider.  Tendinitis Tendinitis is swelling and inflammation of the tendons. Tendons are band-like tissues that connect muscle to bone. Tendinitis commonly occurs in the:   Shoulders (rotator cuff).  Heels (Achilles tendon).  Elbows (triceps tendon). CAUSES Tendinitis is usually caused by overusing the tendon, muscles, and joints involved. When the tissue surrounding a tendon (synovium) becomes inflamed, it is called tenosynovitis. Tendinitis commonly develops in people whose jobs require repetitive motions. SYMPTOMS  Pain.  Tenderness.  Mild swelling. DIAGNOSIS Tendinitis is usually diagnosed by physical exam. Your health care provider may also order X-rays or other imaging tests. TREATMENT Your health care provider may recommend certain medicines or  exercises for your treatment. HOME CARE INSTRUCTIONS   Use a sling or splint for as long as directed by your health care provider until the pain decreases.  Put ice on the injured area.  Put ice in a plastic bag.  Place a towel between your skin and the bag.  Leave the ice on for 15-20 minutes, 3-4 times a day, or as directed by your health care provider.  Avoid using the limb while the tendon is painful. Perform gentle range of motion exercises only as directed by your health care provider. Stop exercises if pain or discomfort increase, unless directed otherwise by your health care provider.  Only take over-the-counter or prescription medicines for pain, discomfort, or fever as directed by your health care provider. SEEK MEDICAL CARE IF:   Your pain and swelling increase.  You develop new, unexplained symptoms, especially increased numbness in the hands. MAKE SURE YOU:   Understand these instructions.  Will watch your condition.  Will get help right away if you are not doing well or get worse. Document Released: 01/19/2000 Document Revised: 06/07/2013 Document Reviewed: 04/09/2010 [facility] Patient Information 2015 [place], [place]. This information is not intended to replace advice given to you by your health care provider. Make sure you discuss any questions you have with your health care provider.

## 2014-10-18 NOTE — ED Notes (Signed)
Pt states that on Thursday she looked after grandson- no known injury - on Friday am woke up and rt  Hand painful and swollen.

## 2014-10-19 NOTE — ED Provider Notes (Signed)
CSN: [id number]     Arrival date & time 10/18/14  1502 History   First MD Initiated Contact with Patient 10/18/14 1534     Chief Complaint  Patient presents with  . Hand Pain     (Consider location/radiation/quality/duration/timing/severity/associated sxs/prior Treatment) The history is provided by the patient.   [name] is a 59 y.o. right handed female with a past medical history as indicated below presenting with right hand pain and swelling which started 4 days ago.  She denies any specific injury, but states she took care of her grandson the day before and spent a good part of the day lifting and carrying him.  She does have a history of arthritis in her hands, worse in the right hand given years working in an environment requiring repetitive hand motion.  She denies redness, weakness, numbness or tingling in her fingers.  She denies elbow or upper extremity pain.  Past Medical History  Diagnosis Date  . Near syncope   . Anxiety and depression   . Hyperlipidemia   . Hypertension   . Hypothyroid   . Tobacco abuse     1/2 pack per day  . Chronic pain 11/22/2010  . Migraine headache 11/22/2010  . Gastroesophageal reflux disease 11/22/2010  . Pneumonia   . Bronchitis   . CHF (congestive heart failure)   . Anemia   . Peripheral vascular disease   . Cancer     uterus   Past Surgical History  Procedure Laterality Date  . Cholecystectomy  2007  . Abdominal hysterectomy      without oophorectomy for neoplastic disease  . Nasal sinus surgery    . Dilation and curettage of uterus    . Lumbar laminectomy  x3    Left; complicated by neurologic dysfunction  . Laparotomy    . Carpal tunnel release      Left  . Cardiac catheterization  07/2013    normal coronary arteries  . Back surgery    . Left heart catheterization with coronary angiogram N/A 07/12/2013    Procedure: LEFT HEART CATHETERIZATION WITH CORONARY ANGIOGRAM;  Surgeon: Leonie Man, MD;  Location: [facility] CATH  LAB;  Service: Cardiovascular;  Laterality: N/A;   Family History  Problem Relation Age of Onset  . Colon polyps Mother   . Deep vein thrombosis Mother   . Heart disease Mother   . Hypertension Mother   . Bleeding Disorder Mother   . Hypertension Sister   . Heart disease Brother     before age 30  . Hypertension Brother    Social History  Substance Use Topics  . Smoking status: Current Every Day Smoker -- 0.50 packs/day for 40 years    Types: Cigarettes    Start date: 09/06/1972  . Smokeless tobacco: Never Used  . Alcohol Use: No   OB History    Gravida Para Term Preterm AB TAB SAB Ectopic Multiple Living            2     Review of Systems  Constitutional: Negative for fever and chills.  Musculoskeletal: Positive for joint swelling and arthralgias. Negative for myalgias.  Skin: Negative for color change and wound.  Neurological: Negative for weakness and numbness.      Allergies  Acetaminophen-codeine; Aspirin; Celecoxib; Levaquin; Lipitor; Sulfa antibiotics; Adhesive; and Latex  Home Medications   Prior to Admission medications   Medication Sig Start Date End Date Taking? Authorizing Provider  albuterol (PROAIR HFA) 108 (90 BASE) MCG/ACT  inhaler Inhale 2 puffs into the lungs every 6 (six) hours as needed for wheezing or shortness of breath.   Yes Historical Provider, MD  metroNIDAZOLE (FLAGYL) 500 MG tablet Take 1 tablet (500 mg total) by mouth every 8 (eight) hours. 03/22/14  Yes Kathie Dike, MD  albuterol (PROVENTIL) (2.5 MG/3ML) 0.083% nebulizer solution Take 3 mLs (2.5 mg total) by nebulization every 6 (six) hours as needed for wheezing or shortness of breath. 03/22/14   Kathie Dike, MD  ALPRAZolam Duanne Moron) 1 MG tablet Take 1 tablet (1 mg total) by mouth 3 (three) times daily as needed for anxiety. 12/22/13   Kristen N Ward, DO  aspirin EC 81 MG tablet Take 81 mg by mouth at bedtime.    Historical Provider, MD  azithromycin (ZITHROMAX) 500 MG tablet Take 1  tablet (500 mg total) by mouth daily. 03/22/14   Kathie Dike, MD  cefUROXime (CEFTIN) 500 MG tablet Take 1 tablet (500 mg total) by mouth 2 (two) times daily with a meal. 03/22/14   Kathie Dike, MD  Cholecalciferol (VITAMIN D PO) Take 1 tablet by mouth daily.    Historical Provider, MD  DULoxetine (CYMBALTA) 60 MG capsule Take 1 capsule (60 mg total) by mouth 2 (two) times daily. 12/22/13   Kristen N Ward, DO  fluticasone (FLONASE) 50 MCG/ACT nasal spray Place 2 sprays into both nostrils daily.  11/26/13   Historical Provider, MD  guaiFENesin (MUCINEX) 600 MG 12 hr tablet Take 1 tablet (600 mg total) by mouth 2 (two) times daily. 03/22/14   Kathie Dike, MD  HYDROcodone-acetaminophen (NORCO) 7.5-325 MG per tablet Take 1 tablet by mouth every 6 (six) hours as needed for moderate pain. 10/18/14   Evalee Jefferson, PA-C  levothyroxine (LEVOTHROID) 25 MCG tablet Take 25 mcg by mouth every morning.     Historical Provider, MD  Potassium 99 MG TABS Take 99 mg by mouth daily.    Historical Provider, MD  predniSONE (DELTASONE) 10 MG tablet 6, 5, 4, 3, 2 then 1 tablet by mouth daily for 6 days total. 10/18/14   Evalee Jefferson, PA-C  saccharomyces boulardii (FLORASTOR) 250 MG capsule Take 1 capsule (250 mg total) by mouth 2 (two) times daily. 03/22/14   Kathie Dike, MD  tiotropium (SPIRIVA HANDIHALER) 18 MCG inhalation capsule Place 1 capsule (18 mcg total) into inhaler and inhale daily. 03/22/14   Kathie Dike, MD  topiramate (TOPAMAX) 25 MG tablet Take 50 mg by mouth 2 (two) times daily.  11/26/13   Historical Provider, MD  VITAMIN E PO Take 1 capsule by mouth daily.    Historical Provider, MD   BP 97/55 mmHg  Pulse 90  Temp(Src) 98.2 F (36.8 C) (Oral)  Resp 18  Ht 5\' 2"  (1.575 m)  Wt 118 lb (53.524 kg)  BMI 21.58 kg/m2  SpO2 98% Physical Exam  Constitutional: She appears well-developed and well-nourished.  HENT:  Head: Atraumatic.  Neck: Normal range of motion.  Cardiovascular:  Pulses equal  bilaterally  Musculoskeletal: She exhibits edema and tenderness.       Right hand: She exhibits tenderness, bony tenderness and swelling. She exhibits normal capillary refill and no deformity. Normal sensation noted. She exhibits no thumb/finger opposition and no wrist extension trouble.  Tender to palpation along right fourth and fifth metacarpals with subtle edema dorsally.  There is no erythema, no red streaking.  Distal sensation is intact with less than 2 second distal cap refill.  Radial pulses intact.  No pain with flexion or  extension of the wrist, pronation or supination of forearm.  Pain is worsened dorsally with attempts at resisted flexion and extension of the fourth and fifth fingers.  Neurological: She is alert. She has normal strength. She displays normal reflexes. No sensory deficit.  Skin: Skin is warm and dry.  Psychiatric: She has a normal mood and affect.    ED Course  Procedures (including critical care time) Labs Review Labs Reviewed - No data to display  Imaging Review Dg Hand Complete Right  10/18/2014   CLINICAL DATA:  Pain and swelling in the right hand for 4 days. No acute injury. Initial encounter.  EXAM: RIGHT HAND - COMPLETE 3+ VIEW  COMPARISON:  None.  FINDINGS: The bones appear mildly demineralized. The alignment is normal. There are mild interphalangeal degenerative changes, greatest at the IP joint of the thumb. No erosive changes, acute fracture, dislocation or focal soft tissue swelling identified. Chondrocalcinosis of the TFCC noted.  IMPRESSION: Interphalangeal osteoarthritis. No acute osseous findings or evidence of inflammatory arthropathy.   Electronically Signed   By: Richardean Sale M.D.   On: 10/18/2014 16:07   I have personally reviewed and evaluated these images and lab results as part of my medical decision-making.   EKG Interpretation None      MDM   Final diagnoses:  Primary osteoarthritis of right hand  Tendinitis of hand       Radiological studies were viewed, interpreted and considered during the medical decision making and disposition process. I agree with radiologists reading.  Results were also discussed with patient.   Patient was placed in an Ace wrap for compression.  Hydrocodone and prednisone taper prescribed.  Elevation, heat therapy recommended.  Follow up with Dr. Amedeo Plenty who is on-call for hand specialist  for recheck if symptoms persist, worsen or do not improve with this treatment.   Evalee Jefferson, PA-C 10/19/14 1911  Tanna Furry, MD 10/22/14 [phone]

## 2015-03-01 ENCOUNTER — Telehealth: Payer: Self-pay | Admitting: Cardiovascular Disease

## 2015-03-01 NOTE — Telephone Encounter (Signed)
Patient last seen 12/02/2013.  At last OV her f/u was "as needed".  Will fwd to MD for decision on this.

## 2015-03-01 NOTE — Telephone Encounter (Signed)
[name] called stating that she would like to know if Dr. Bronson Ing would call her in a RX to help quit smoking.

## 2015-03-02 MED ORDER — VARENICLINE TARTRATE 0.5 MG X 11 & 1 MG X 42 PO MISC: ORAL | Status: DC

## 2015-03-02 NOTE — Telephone Encounter (Signed)
Can prescribe Chantix starter kit and tell her to then f/u with PCP regarding this.

## 2015-03-02 NOTE — Telephone Encounter (Signed)
Patient notified.  Medication sent to [name] at her request.

## 2015-07-14 ENCOUNTER — Other Ambulatory Visit (HOSPITAL_COMMUNITY): Payer: Self-pay | Admitting: Respiratory Therapy

## 2015-07-14 DIAGNOSIS — J441 Chronic obstructive pulmonary disease with (acute) exacerbation: Secondary | ICD-10-CM

## 2015-07-26 ENCOUNTER — Ambulatory Visit (HOSPITAL_COMMUNITY)
Admission: RE | Admit: 2015-07-26 | Discharge: 2015-07-26 | Disposition: A | Discharge status: Home / Self Care | Payer: Medicare Other | Source: Ambulatory Visit | Attending: Internal Medicine | Admitting: Internal Medicine

## 2015-07-26 DIAGNOSIS — J449 Chronic obstructive pulmonary disease, unspecified: Secondary | ICD-10-CM | POA: Insufficient documentation

## 2015-07-26 DIAGNOSIS — J441 Chronic obstructive pulmonary disease with (acute) exacerbation: Secondary | ICD-10-CM

## 2015-07-26 LAB — SPIROMETRY WITH GRAPH
FEF 25-75 PRE: 0.51 L/s
FEF2575-%Pred-Pre: 22 %
FEV1-%PRED-PRE: 53 %
FEV1-PRE: 1.28 L
FEV1FVC-%PRED-PRE: 74 %
FEV6-%Pred-Pre: 69 %
FEV6-PRE: 2.08 L
FEV6FVC-%Pred-Pre: 97 %
FVC-%PRED-PRE: 71 %
FVC-Pre: 2.2 L
Pre FEV1/FVC ratio: 58 %
Pre FEV6/FVC Ratio: 95 %

## 2015-09-11 ENCOUNTER — Other Ambulatory Visit (HOSPITAL_COMMUNITY): Payer: Self-pay | Admitting: Internal Medicine

## 2015-09-11 DIAGNOSIS — Z780 Asymptomatic menopausal state: Secondary | ICD-10-CM

## 2015-09-18 ENCOUNTER — Other Ambulatory Visit (HOSPITAL_COMMUNITY): Payer: Medicare Other

## 2015-10-18 ENCOUNTER — Ambulatory Visit (HOSPITAL_COMMUNITY)
Admission: RE | Admit: 2015-10-18 | Discharge: 2015-10-18 | Disposition: A | Discharge status: Home / Self Care | Payer: Medicare Other | Source: Ambulatory Visit | Attending: Internal Medicine | Admitting: Internal Medicine

## 2015-10-18 DIAGNOSIS — Z780 Asymptomatic menopausal state: Secondary | ICD-10-CM

## 2015-10-18 DIAGNOSIS — Z79899 Other long term (current) drug therapy: Secondary | ICD-10-CM | POA: Insufficient documentation

## 2015-10-18 DIAGNOSIS — Z720 Tobacco use: Secondary | ICD-10-CM | POA: Diagnosis not present

## 2015-10-18 DIAGNOSIS — M810 Age-related osteoporosis without current pathological fracture: Secondary | ICD-10-CM | POA: Diagnosis not present

## 2015-11-02 ENCOUNTER — Telehealth: Payer: Self-pay | Admitting: General Practice

## 2015-11-02 NOTE — Telephone Encounter (Signed)
Patient received a letter from [facility] to schedule her screening tcs  She can be reached at [phone]

## 2015-11-06 NOTE — Telephone Encounter (Signed)
LMOM for a return call.  

## 2015-11-07 ENCOUNTER — Telehealth: Payer: Self-pay

## 2015-11-07 NOTE — Telephone Encounter (Signed)
Pt said she has had some rectal bleeding several days in a row . OV with Neil Crouch, PA on 11/29/2015 at 2:30 PM.

## 2015-11-08 NOTE — Telephone Encounter (Signed)
OV with Neil Crouch, PA on 11/29/2015 at 2:30 Pm.

## 2015-11-29 ENCOUNTER — Ambulatory Visit: Payer: Medicare Other | Admitting: Gastroenterology

## 2016-02-19 IMAGING — MR MR LUMBAR SPINE WO/W CM
4 of 7 series · 12 of 48 positions shown · IV contrast (10ml Multihance)
Comparison: 10/14/2012.

CLINICAL DATA: Low back pain and numbness

EXAM:
MRI LUMBAR SPINE WITHOUT AND WITH CONTRAST
TECHNIQUE: Multiplanar and multiecho pulse sequences of the lumbar spine were
obtained without and with intravenous contrast.
CONTRAST:  10mL MULTIHANCE GADOBENATE DIMEGLUMINE 529 MG/ML IV SOLN

[Series 3: T1 · sagittal · 4.0mm · 0.35mm/px · 3 of 15 slices shown]
[im 1/15]
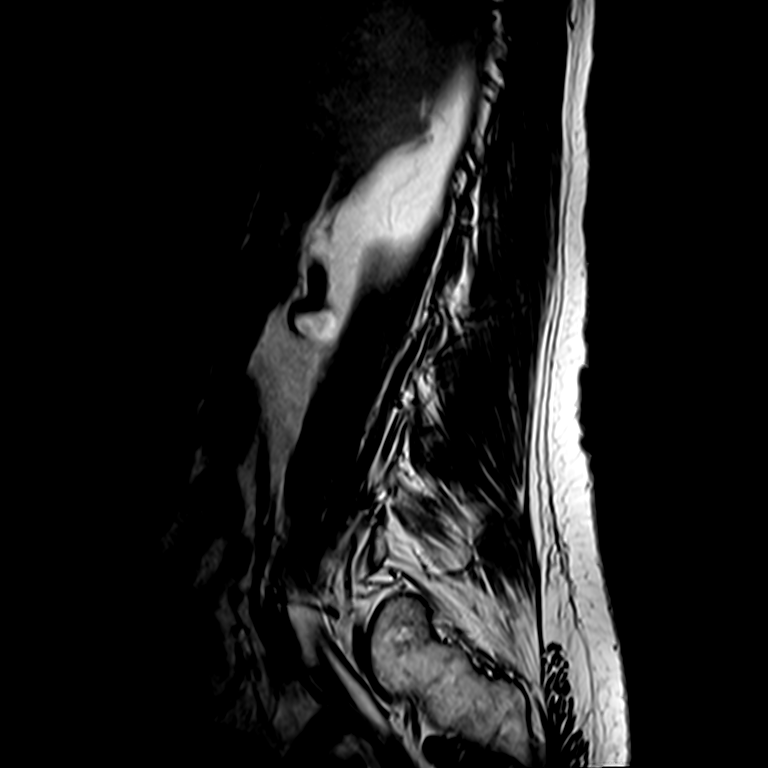
[im 10/15]
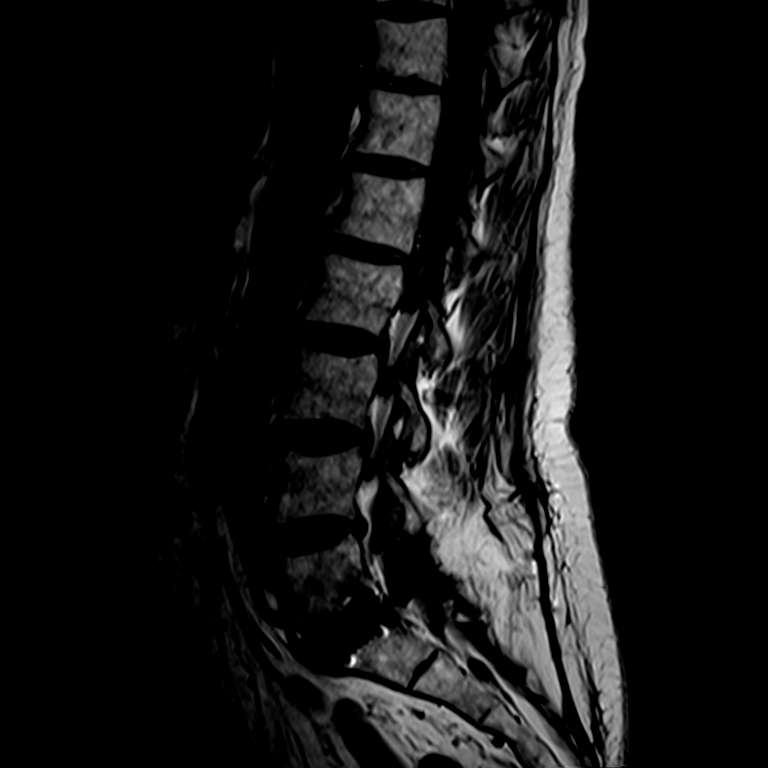
[im 15/15]
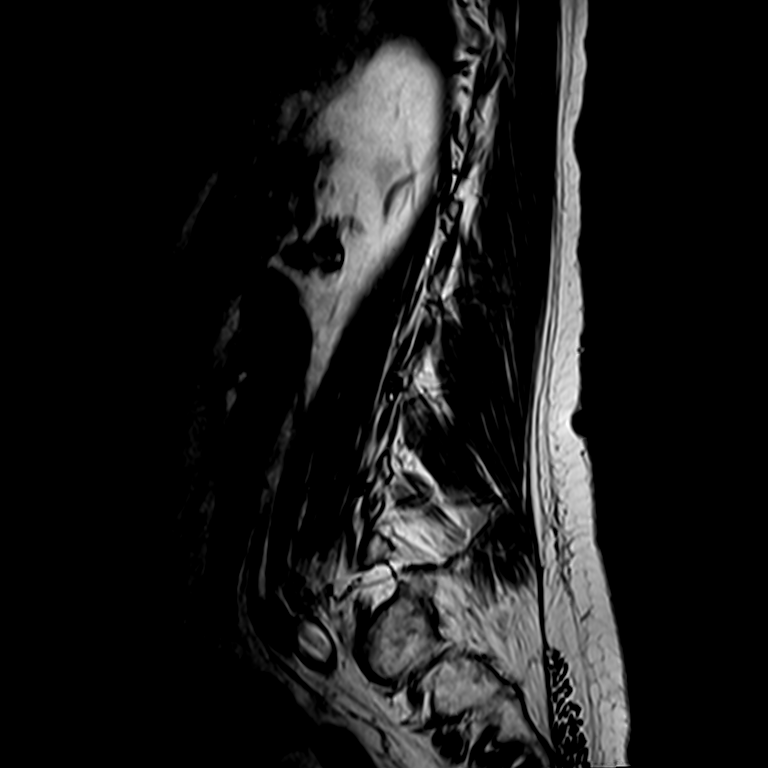

[Series 4: T2 · sagittal · 4.0mm · 0.39mm/px · 3 of 20 slices shown (1 of 3)]
[im 4/20]
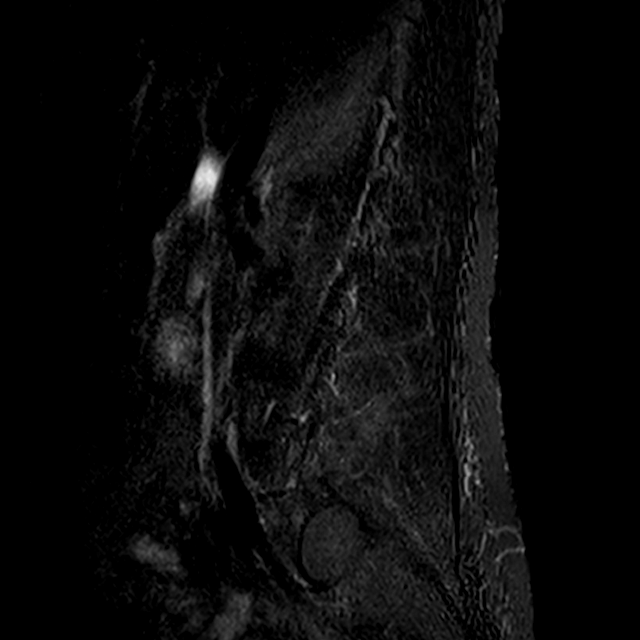
[im 12/20]
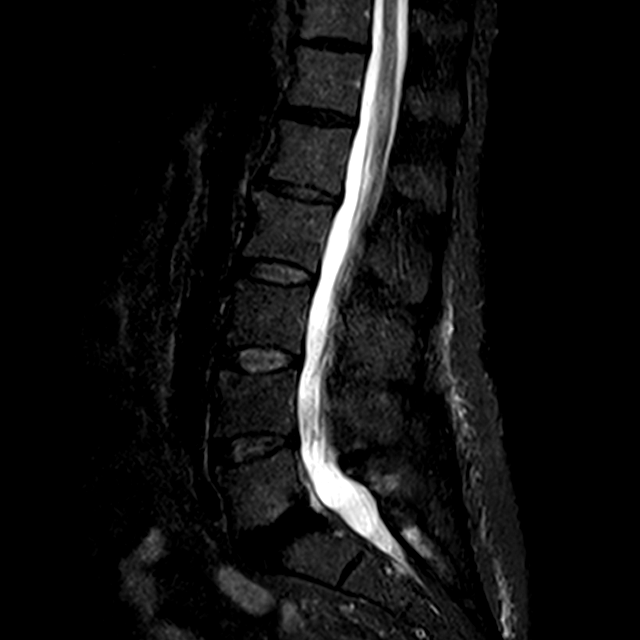
[im 20/20]
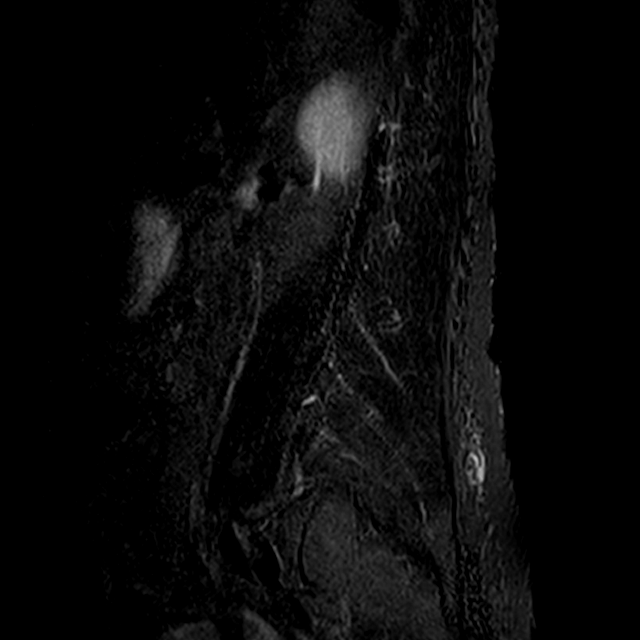

[Series 5: T2 · axial · 4.0mm · 0.27mm/px · z∈[-127,+16]mm · 3 of 35 slices shown (2 of 3)]
[im 4/35]
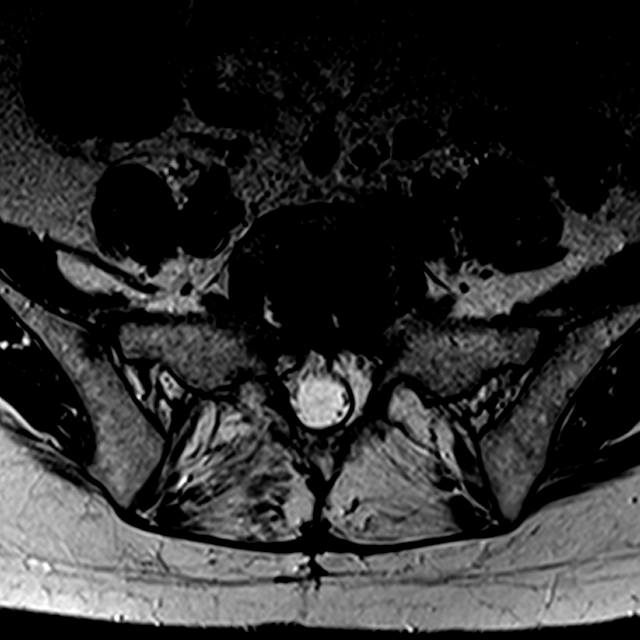
[im 19/35]
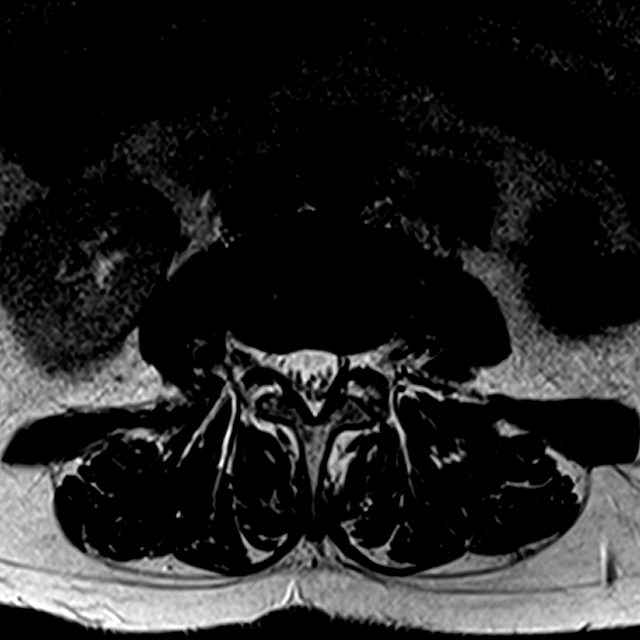
[im 31/35]
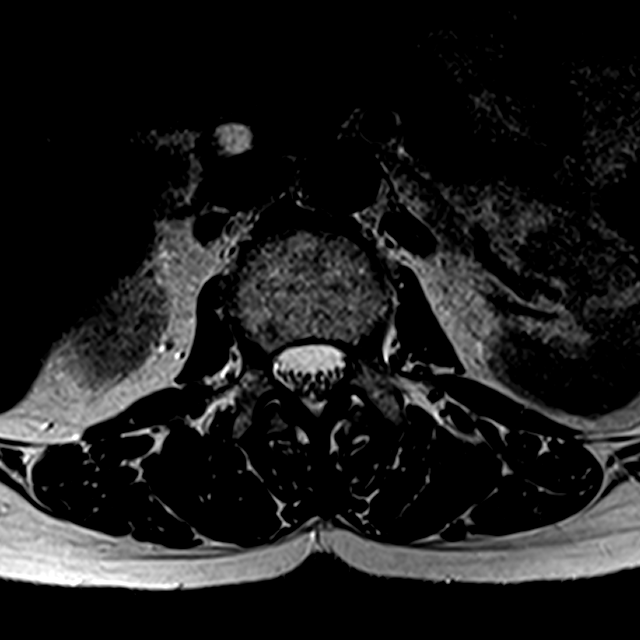

[Series 7: T2 · sagittal · 4.0mm · 0.50mm/px · 3 of 15 slices shown (3 of 3)]
[im 1/15]
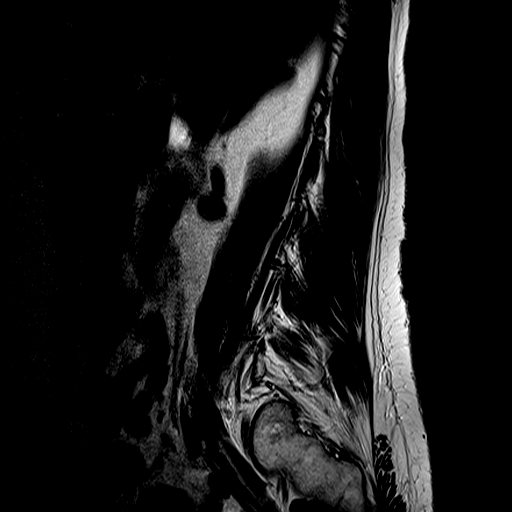
[im 10/15]
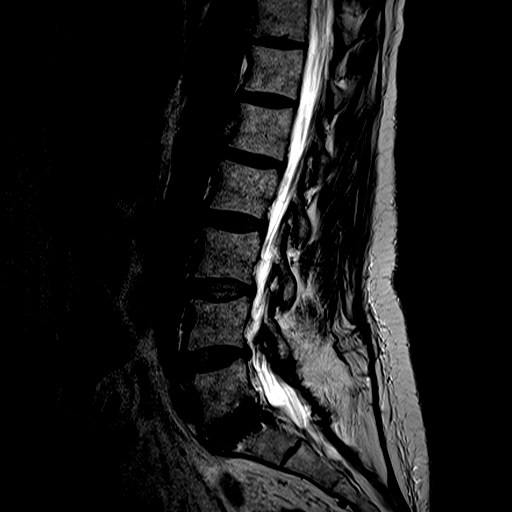
[im 15/15]
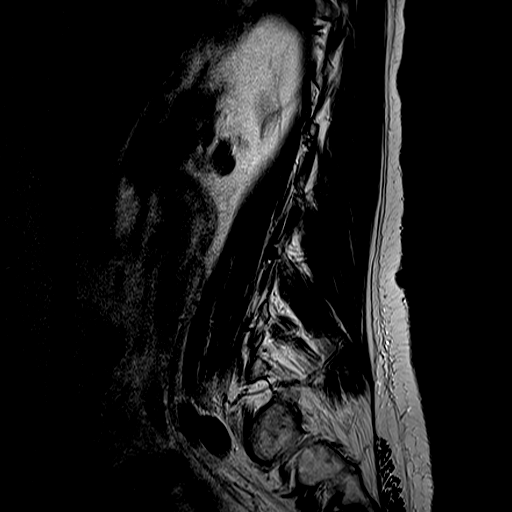

[12 of 48 positions shown; findings below may reference images not displayed]

FINDINGS: The vertebral bodies of the lumbar spine are normal in size and
alignment. There is normal bone marrow signal demonstrated
throughout the vertebra. There is an interbody cage device at L5-S1
in satisfactory position with mild surrounding susceptibility
artifact. The remainder the disc spaces are preserved. Prior L5-S1
laminectomy. There are no areas of abnormal enhancement.

The spinal cord is normal in signal and contour. The cord terminates
normally at L1 . The nerve roots of the cauda equina and the filum
terminale are normal.

The visualized portions of the SI joints are unremarkable.

The imaged intra-abdominal contents are unremarkable.

T12-L1: No significant disc bulge. No evidence of neural foraminal
stenosis. No central canal stenosis.

L1-L2: Minimal broad-based disc bulge. No evidence of neural
foraminal stenosis. No central canal stenosis.

L2-L3: No significant disc bulge. No evidence of neural foraminal
stenosis. No central canal stenosis.

L3-L4: No significant disc bulge. No evidence of neural foraminal
stenosis. No central canal stenosis.

L4-L5: No significant disc bulge. Minimal bilateral foraminal
stenosis. No central canal stenosis.

L5-S1: No significant disc bulge. No evidence of neural foraminal
stenosis. No central canal stenosis.
IMPRESSION: 1. Interbody fusion at L5-S1 with posterior decompression. No
recurrent central canal stenosis or foraminal stenosis.
2. Minimal bilateral foraminal stenosis at L4-5. No significant
interval change.

## 2016-05-16 ENCOUNTER — Ambulatory Visit (HOSPITAL_COMMUNITY)
Admission: RE | Admit: 2016-05-16 | Discharge: 2016-05-16 | Disposition: A | Discharge status: Home / Self Care | Payer: Medicare Other | Source: Ambulatory Visit | Attending: Internal Medicine | Admitting: Internal Medicine

## 2016-05-16 ENCOUNTER — Other Ambulatory Visit (HOSPITAL_COMMUNITY): Payer: Self-pay | Admitting: Internal Medicine

## 2016-05-16 DIAGNOSIS — R918 Other nonspecific abnormal finding of lung field: Secondary | ICD-10-CM | POA: Insufficient documentation

## 2016-05-16 DIAGNOSIS — R059 Cough, unspecified: Secondary | ICD-10-CM

## 2016-05-16 DIAGNOSIS — I700 Atherosclerosis of aorta: Secondary | ICD-10-CM | POA: Diagnosis not present

## 2016-05-16 DIAGNOSIS — E785 Hyperlipidemia, unspecified: Secondary | ICD-10-CM | POA: Diagnosis not present

## 2016-05-16 DIAGNOSIS — R05 Cough: Secondary | ICD-10-CM | POA: Diagnosis not present

## 2016-05-16 DIAGNOSIS — E875 Hyperkalemia: Secondary | ICD-10-CM | POA: Diagnosis not present

## 2016-05-16 DIAGNOSIS — Z7289 Other problems related to lifestyle: Secondary | ICD-10-CM | POA: Diagnosis not present

## 2016-05-18 DIAGNOSIS — I10 Essential (primary) hypertension: Secondary | ICD-10-CM | POA: Diagnosis not present

## 2016-05-18 DIAGNOSIS — E785 Hyperlipidemia, unspecified: Secondary | ICD-10-CM | POA: Diagnosis not present

## 2016-05-18 DIAGNOSIS — E039 Hypothyroidism, unspecified: Secondary | ICD-10-CM | POA: Diagnosis not present

## 2016-05-18 DIAGNOSIS — M545 Low back pain: Secondary | ICD-10-CM | POA: Diagnosis not present

## 2016-05-18 DIAGNOSIS — J449 Chronic obstructive pulmonary disease, unspecified: Secondary | ICD-10-CM | POA: Diagnosis not present

## 2016-06-10 DIAGNOSIS — M545 Low back pain: Secondary | ICD-10-CM | POA: Diagnosis not present

## 2016-06-10 DIAGNOSIS — I10 Essential (primary) hypertension: Secondary | ICD-10-CM | POA: Diagnosis not present

## 2016-06-10 DIAGNOSIS — E039 Hypothyroidism, unspecified: Secondary | ICD-10-CM | POA: Diagnosis not present

## 2016-06-10 DIAGNOSIS — R52 Pain, unspecified: Secondary | ICD-10-CM | POA: Diagnosis not present

## 2016-06-10 DIAGNOSIS — M549 Dorsalgia, unspecified: Secondary | ICD-10-CM | POA: Diagnosis not present

## 2016-06-14 DIAGNOSIS — R2689 Other abnormalities of gait and mobility: Secondary | ICD-10-CM | POA: Diagnosis not present

## 2016-06-14 DIAGNOSIS — M545 Low back pain: Secondary | ICD-10-CM | POA: Diagnosis not present

## 2016-06-14 DIAGNOSIS — K219 Gastro-esophageal reflux disease without esophagitis: Secondary | ICD-10-CM | POA: Diagnosis not present

## 2016-06-22 DIAGNOSIS — I10 Essential (primary) hypertension: Secondary | ICD-10-CM | POA: Diagnosis not present

## 2016-06-22 DIAGNOSIS — E559 Vitamin D deficiency, unspecified: Secondary | ICD-10-CM | POA: Diagnosis not present

## 2016-07-11 ENCOUNTER — Other Ambulatory Visit: Payer: Self-pay

## 2016-07-11 DIAGNOSIS — G4700 Insomnia, unspecified: Secondary | ICD-10-CM | POA: Diagnosis not present

## 2016-07-11 DIAGNOSIS — R52 Pain, unspecified: Secondary | ICD-10-CM | POA: Diagnosis not present

## 2016-07-11 DIAGNOSIS — I10 Essential (primary) hypertension: Secondary | ICD-10-CM | POA: Diagnosis not present

## 2016-07-11 NOTE — Patient Outreach (Signed)
[place] [facility] [facility]) Care Management  07/11/2016  [name] 1955-04-12 [id number]   Medication Adherence call to [name]  The reason for our call is because she is past due on her rosuvastatin 40 mg and we where trying to reach out  to help her with the medication ,but we have the wrong telephone number call pharmacy [facility]  And they have the same telephone number.   [place] Management Direct Dial [phone]  Fax [phone] [email or web address]

## 2016-08-14 DIAGNOSIS — I10 Essential (primary) hypertension: Secondary | ICD-10-CM | POA: Diagnosis not present

## 2016-08-14 DIAGNOSIS — G2581 Restless legs syndrome: Secondary | ICD-10-CM | POA: Diagnosis not present

## 2016-08-14 DIAGNOSIS — Z79899 Other long term (current) drug therapy: Secondary | ICD-10-CM | POA: Diagnosis not present

## 2016-09-10 DIAGNOSIS — M549 Dorsalgia, unspecified: Secondary | ICD-10-CM | POA: Diagnosis not present

## 2016-09-10 DIAGNOSIS — Z79899 Other long term (current) drug therapy: Secondary | ICD-10-CM | POA: Diagnosis not present

## 2016-09-10 DIAGNOSIS — H66003 Acute suppurative otitis media without spontaneous rupture of ear drum, bilateral: Secondary | ICD-10-CM | POA: Diagnosis not present

## 2016-09-23 DIAGNOSIS — K625 Hemorrhage of anus and rectum: Secondary | ICD-10-CM | POA: Diagnosis not present

## 2016-09-23 DIAGNOSIS — R195 Other fecal abnormalities: Secondary | ICD-10-CM | POA: Diagnosis not present

## 2016-09-24 DIAGNOSIS — Z79899 Other long term (current) drug therapy: Secondary | ICD-10-CM | POA: Diagnosis not present

## 2016-09-24 DIAGNOSIS — I10 Essential (primary) hypertension: Secondary | ICD-10-CM | POA: Diagnosis not present

## 2016-09-24 DIAGNOSIS — M25561 Pain in right knee: Secondary | ICD-10-CM | POA: Diagnosis not present

## 2016-10-14 DIAGNOSIS — K625 Hemorrhage of anus and rectum: Secondary | ICD-10-CM | POA: Diagnosis not present

## 2016-10-14 DIAGNOSIS — R195 Other fecal abnormalities: Secondary | ICD-10-CM | POA: Diagnosis not present

## 2016-10-14 DIAGNOSIS — D126 Benign neoplasm of colon, unspecified: Secondary | ICD-10-CM | POA: Diagnosis not present

## 2016-10-14 DIAGNOSIS — K635 Polyp of colon: Secondary | ICD-10-CM | POA: Diagnosis not present

## 2016-10-15 DIAGNOSIS — G2581 Restless legs syndrome: Secondary | ICD-10-CM | POA: Diagnosis not present

## 2016-10-15 DIAGNOSIS — Z79899 Other long term (current) drug therapy: Secondary | ICD-10-CM | POA: Diagnosis not present

## 2016-10-15 DIAGNOSIS — G4700 Insomnia, unspecified: Secondary | ICD-10-CM | POA: Diagnosis not present

## 2016-10-15 DIAGNOSIS — I10 Essential (primary) hypertension: Secondary | ICD-10-CM | POA: Diagnosis not present

## 2016-10-22 DIAGNOSIS — D126 Benign neoplasm of colon, unspecified: Secondary | ICD-10-CM | POA: Diagnosis not present

## 2016-10-22 DIAGNOSIS — K635 Polyp of colon: Secondary | ICD-10-CM | POA: Diagnosis not present

## 2016-11-12 DIAGNOSIS — Z79899 Other long term (current) drug therapy: Secondary | ICD-10-CM | POA: Diagnosis not present

## 2016-11-12 DIAGNOSIS — R52 Pain, unspecified: Secondary | ICD-10-CM | POA: Diagnosis not present

## 2016-11-12 DIAGNOSIS — Z79891 Long term (current) use of opiate analgesic: Secondary | ICD-10-CM | POA: Diagnosis not present

## 2016-12-06 DIAGNOSIS — E039 Hypothyroidism, unspecified: Secondary | ICD-10-CM | POA: Diagnosis not present

## 2016-12-06 DIAGNOSIS — E785 Hyperlipidemia, unspecified: Secondary | ICD-10-CM | POA: Diagnosis not present

## 2016-12-06 DIAGNOSIS — E559 Vitamin D deficiency, unspecified: Secondary | ICD-10-CM | POA: Diagnosis not present

## 2016-12-06 DIAGNOSIS — I10 Essential (primary) hypertension: Secondary | ICD-10-CM | POA: Diagnosis not present

## 2016-12-08 DIAGNOSIS — M542 Cervicalgia: Secondary | ICD-10-CM | POA: Diagnosis not present

## 2016-12-08 DIAGNOSIS — S139XXA Sprain of joints and ligaments of unspecified parts of neck, initial encounter: Secondary | ICD-10-CM | POA: Diagnosis not present

## 2016-12-08 DIAGNOSIS — S199XXA Unspecified injury of neck, initial encounter: Secondary | ICD-10-CM | POA: Diagnosis not present

## 2016-12-08 DIAGNOSIS — Z79899 Other long term (current) drug therapy: Secondary | ICD-10-CM | POA: Diagnosis not present

## 2016-12-08 DIAGNOSIS — Z7982 Long term (current) use of aspirin: Secondary | ICD-10-CM | POA: Diagnosis not present

## 2016-12-10 DIAGNOSIS — Z79899 Other long term (current) drug therapy: Secondary | ICD-10-CM | POA: Diagnosis not present

## 2016-12-10 DIAGNOSIS — M549 Dorsalgia, unspecified: Secondary | ICD-10-CM | POA: Diagnosis not present

## 2016-12-10 DIAGNOSIS — E875 Hyperkalemia: Secondary | ICD-10-CM | POA: Diagnosis not present

## 2016-12-10 DIAGNOSIS — N183 Chronic kidney disease, stage 3 (moderate): Secondary | ICD-10-CM | POA: Diagnosis not present

## 2017-01-07 DIAGNOSIS — R3915 Urgency of urination: Secondary | ICD-10-CM | POA: Diagnosis not present

## 2017-01-07 DIAGNOSIS — J449 Chronic obstructive pulmonary disease, unspecified: Secondary | ICD-10-CM | POA: Diagnosis not present

## 2017-01-07 DIAGNOSIS — M549 Dorsalgia, unspecified: Secondary | ICD-10-CM | POA: Diagnosis not present

## 2017-01-07 DIAGNOSIS — Z79899 Other long term (current) drug therapy: Secondary | ICD-10-CM | POA: Diagnosis not present

## 2017-01-07 DIAGNOSIS — M5432 Sciatica, left side: Secondary | ICD-10-CM | POA: Diagnosis not present

## 2017-01-07 DIAGNOSIS — R350 Frequency of micturition: Secondary | ICD-10-CM | POA: Diagnosis not present

## 2017-02-14 DIAGNOSIS — R05 Cough: Secondary | ICD-10-CM | POA: Diagnosis not present

## 2017-02-14 DIAGNOSIS — M545 Low back pain: Secondary | ICD-10-CM | POA: Diagnosis not present

## 2017-03-14 DIAGNOSIS — Z79899 Other long term (current) drug therapy: Secondary | ICD-10-CM | POA: Diagnosis not present

## 2017-03-14 DIAGNOSIS — M545 Low back pain: Secondary | ICD-10-CM | POA: Diagnosis not present

## 2017-03-14 DIAGNOSIS — Z79891 Long term (current) use of opiate analgesic: Secondary | ICD-10-CM | POA: Diagnosis not present

## 2017-03-14 DIAGNOSIS — J441 Chronic obstructive pulmonary disease with (acute) exacerbation: Secondary | ICD-10-CM | POA: Diagnosis not present

## 2017-03-14 DIAGNOSIS — R05 Cough: Secondary | ICD-10-CM | POA: Diagnosis not present

## 2017-04-04 LAB — HM COLONOSCOPY

## 2017-04-10 DIAGNOSIS — Z79899 Other long term (current) drug therapy: Secondary | ICD-10-CM | POA: Diagnosis not present

## 2017-04-10 DIAGNOSIS — Z79891 Long term (current) use of opiate analgesic: Secondary | ICD-10-CM | POA: Diagnosis not present

## 2017-04-10 DIAGNOSIS — M545 Low back pain: Secondary | ICD-10-CM | POA: Diagnosis not present

## 2017-04-10 DIAGNOSIS — R350 Frequency of micturition: Secondary | ICD-10-CM | POA: Diagnosis not present

## 2017-04-10 DIAGNOSIS — R3911 Hesitancy of micturition: Secondary | ICD-10-CM | POA: Diagnosis not present

## 2017-04-10 DIAGNOSIS — J449 Chronic obstructive pulmonary disease, unspecified: Secondary | ICD-10-CM | POA: Diagnosis not present

## 2017-05-08 DIAGNOSIS — Z79891 Long term (current) use of opiate analgesic: Secondary | ICD-10-CM | POA: Diagnosis not present

## 2017-05-08 DIAGNOSIS — M545 Low back pain: Secondary | ICD-10-CM | POA: Diagnosis not present

## 2017-05-08 DIAGNOSIS — Z79899 Other long term (current) drug therapy: Secondary | ICD-10-CM | POA: Diagnosis not present

## 2017-05-08 DIAGNOSIS — G4700 Insomnia, unspecified: Secondary | ICD-10-CM | POA: Diagnosis not present

## 2017-05-08 DIAGNOSIS — J449 Chronic obstructive pulmonary disease, unspecified: Secondary | ICD-10-CM | POA: Diagnosis not present

## 2017-06-05 DIAGNOSIS — Z79891 Long term (current) use of opiate analgesic: Secondary | ICD-10-CM | POA: Diagnosis not present

## 2017-06-05 DIAGNOSIS — Z79899 Other long term (current) drug therapy: Secondary | ICD-10-CM | POA: Diagnosis not present

## 2017-06-05 DIAGNOSIS — G4700 Insomnia, unspecified: Secondary | ICD-10-CM | POA: Diagnosis not present

## 2017-06-05 DIAGNOSIS — M5432 Sciatica, left side: Secondary | ICD-10-CM | POA: Diagnosis not present

## 2017-07-04 DIAGNOSIS — Z79899 Other long term (current) drug therapy: Secondary | ICD-10-CM | POA: Diagnosis not present

## 2017-07-04 DIAGNOSIS — E039 Hypothyroidism, unspecified: Secondary | ICD-10-CM | POA: Diagnosis not present

## 2017-07-04 DIAGNOSIS — Z79891 Long term (current) use of opiate analgesic: Secondary | ICD-10-CM | POA: Diagnosis not present

## 2017-07-04 DIAGNOSIS — G4700 Insomnia, unspecified: Secondary | ICD-10-CM | POA: Diagnosis not present

## 2017-07-25 DIAGNOSIS — Z0001 Encounter for general adult medical examination with abnormal findings: Secondary | ICD-10-CM | POA: Diagnosis not present

## 2017-07-25 DIAGNOSIS — I10 Essential (primary) hypertension: Secondary | ICD-10-CM | POA: Diagnosis not present

## 2017-07-25 DIAGNOSIS — E785 Hyperlipidemia, unspecified: Secondary | ICD-10-CM | POA: Diagnosis not present

## 2017-07-25 DIAGNOSIS — E039 Hypothyroidism, unspecified: Secondary | ICD-10-CM | POA: Diagnosis not present

## 2017-07-25 DIAGNOSIS — N183 Chronic kidney disease, stage 3 (moderate): Secondary | ICD-10-CM | POA: Diagnosis not present

## 2017-07-30 DIAGNOSIS — Z6823 Body mass index (BMI) 23.0-23.9, adult: Secondary | ICD-10-CM | POA: Diagnosis not present

## 2017-07-30 DIAGNOSIS — Z713 Dietary counseling and surveillance: Secondary | ICD-10-CM | POA: Diagnosis not present

## 2017-07-30 DIAGNOSIS — Z79899 Other long term (current) drug therapy: Secondary | ICD-10-CM | POA: Diagnosis not present

## 2017-07-30 DIAGNOSIS — Z79891 Long term (current) use of opiate analgesic: Secondary | ICD-10-CM | POA: Diagnosis not present

## 2017-07-30 DIAGNOSIS — Z0001 Encounter for general adult medical examination with abnormal findings: Secondary | ICD-10-CM | POA: Diagnosis not present

## 2017-07-30 DIAGNOSIS — M545 Low back pain: Secondary | ICD-10-CM | POA: Diagnosis not present

## 2017-08-20 ENCOUNTER — Other Ambulatory Visit: Payer: Self-pay

## 2017-08-20 DIAGNOSIS — R0989 Other specified symptoms and signs involving the circulatory and respiratory systems: Secondary | ICD-10-CM

## 2017-08-27 DIAGNOSIS — M545 Low back pain: Secondary | ICD-10-CM | POA: Diagnosis not present

## 2017-08-27 DIAGNOSIS — M5432 Sciatica, left side: Secondary | ICD-10-CM | POA: Diagnosis not present

## 2017-08-27 DIAGNOSIS — Z79891 Long term (current) use of opiate analgesic: Secondary | ICD-10-CM | POA: Diagnosis not present

## 2017-08-27 DIAGNOSIS — Z79899 Other long term (current) drug therapy: Secondary | ICD-10-CM | POA: Diagnosis not present

## 2017-10-01 DIAGNOSIS — I10 Essential (primary) hypertension: Secondary | ICD-10-CM | POA: Diagnosis not present

## 2017-10-01 DIAGNOSIS — R0600 Dyspnea, unspecified: Secondary | ICD-10-CM | POA: Diagnosis not present

## 2017-10-01 DIAGNOSIS — R05 Cough: Secondary | ICD-10-CM | POA: Diagnosis not present

## 2017-10-01 DIAGNOSIS — J440 Chronic obstructive pulmonary disease with acute lower respiratory infection: Secondary | ICD-10-CM | POA: Diagnosis not present

## 2017-10-20 DIAGNOSIS — E039 Hypothyroidism, unspecified: Secondary | ICD-10-CM | POA: Diagnosis not present

## 2017-10-20 DIAGNOSIS — Z79899 Other long term (current) drug therapy: Secondary | ICD-10-CM | POA: Diagnosis not present

## 2017-10-20 DIAGNOSIS — Z79891 Long term (current) use of opiate analgesic: Secondary | ICD-10-CM | POA: Diagnosis not present

## 2017-10-24 ENCOUNTER — Encounter (HOSPITAL_COMMUNITY): Payer: Medicare Other

## 2017-10-24 ENCOUNTER — Encounter: Payer: Medicare Other | Admitting: Vascular Surgery

## 2017-10-30 DIAGNOSIS — M542 Cervicalgia: Secondary | ICD-10-CM | POA: Diagnosis not present

## 2017-10-30 DIAGNOSIS — Z79891 Long term (current) use of opiate analgesic: Secondary | ICD-10-CM | POA: Diagnosis not present

## 2017-10-30 DIAGNOSIS — R202 Paresthesia of skin: Secondary | ICD-10-CM | POA: Diagnosis not present

## 2017-10-30 DIAGNOSIS — S199XXA Unspecified injury of neck, initial encounter: Secondary | ICD-10-CM | POA: Diagnosis not present

## 2017-10-30 DIAGNOSIS — Z9181 History of falling: Secondary | ICD-10-CM | POA: Diagnosis not present

## 2017-10-31 ENCOUNTER — Other Ambulatory Visit (HOSPITAL_COMMUNITY): Payer: Self-pay | Admitting: General Practice

## 2017-10-31 ENCOUNTER — Encounter: Payer: Medicare Other | Admitting: Vascular Surgery

## 2017-10-31 ENCOUNTER — Other Ambulatory Visit (HOSPITAL_COMMUNITY): Payer: Self-pay | Admitting: Adult Health

## 2017-10-31 ENCOUNTER — Ambulatory Visit (HOSPITAL_COMMUNITY): Payer: Medicare Other

## 2017-10-31 DIAGNOSIS — S199XXA Unspecified injury of neck, initial encounter: Secondary | ICD-10-CM

## 2017-11-05 ENCOUNTER — Other Ambulatory Visit (HOSPITAL_COMMUNITY): Payer: Self-pay | Admitting: Family Medicine

## 2017-11-05 ENCOUNTER — Other Ambulatory Visit: Payer: Self-pay

## 2017-11-05 ENCOUNTER — Ambulatory Visit (HOSPITAL_COMMUNITY)
Admission: RE | Admit: 2017-11-05 | Discharge: 2017-11-05 | Disposition: A | Discharge status: Home / Self Care | Payer: Medicare Other | Source: Ambulatory Visit | Attending: Family Medicine | Admitting: Family Medicine

## 2017-11-05 DIAGNOSIS — M542 Cervicalgia: Secondary | ICD-10-CM | POA: Diagnosis present

## 2017-11-05 DIAGNOSIS — M50322 Other cervical disc degeneration at C5-C6 level: Secondary | ICD-10-CM | POA: Insufficient documentation

## 2017-11-05 NOTE — Patient Outreach (Signed)
[facility]) Care Management  11/05/2017  [name] 18-Apr-1955 [id number]   Medication Adherence call to [name] spoke with patient she is no longer taking Lisinopril 10 mg doctor took her off on Rosuvastatin 40 mg patient still has medication and will order it once she finished it. [name] is showing past due under [place].  [place] Management Direct Dial [phone]  Fax [phone] [email or web address]

## 2017-11-11 ENCOUNTER — Other Ambulatory Visit (HOSPITAL_COMMUNITY): Payer: Self-pay | Admitting: Family Medicine

## 2017-11-11 DIAGNOSIS — Z9181 History of falling: Secondary | ICD-10-CM

## 2017-11-11 DIAGNOSIS — M50322 Other cervical disc degeneration at C5-C6 level: Secondary | ICD-10-CM

## 2017-11-11 DIAGNOSIS — M542 Cervicalgia: Secondary | ICD-10-CM

## 2017-11-19 ENCOUNTER — Ambulatory Visit (HOSPITAL_COMMUNITY)
Admission: RE | Admit: 2017-11-19 | Discharge: 2017-11-19 | Disposition: A | Discharge status: Home / Self Care | Payer: Medicare Other | Source: Ambulatory Visit | Attending: Family Medicine | Admitting: Family Medicine

## 2017-11-19 DIAGNOSIS — M2578 Osteophyte, vertebrae: Secondary | ICD-10-CM | POA: Diagnosis not present

## 2017-11-19 DIAGNOSIS — M50322 Other cervical disc degeneration at C5-C6 level: Secondary | ICD-10-CM | POA: Diagnosis not present

## 2017-11-19 DIAGNOSIS — M47812 Spondylosis without myelopathy or radiculopathy, cervical region: Secondary | ICD-10-CM | POA: Diagnosis not present

## 2017-11-19 DIAGNOSIS — Z9181 History of falling: Secondary | ICD-10-CM

## 2017-11-19 DIAGNOSIS — I10 Essential (primary) hypertension: Secondary | ICD-10-CM | POA: Diagnosis not present

## 2017-11-19 DIAGNOSIS — E039 Hypothyroidism, unspecified: Secondary | ICD-10-CM | POA: Diagnosis not present

## 2017-11-19 DIAGNOSIS — E785 Hyperlipidemia, unspecified: Secondary | ICD-10-CM | POA: Diagnosis not present

## 2017-11-19 DIAGNOSIS — M542 Cervicalgia: Secondary | ICD-10-CM

## 2017-11-20 ENCOUNTER — Ambulatory Visit (HOSPITAL_COMMUNITY): Payer: Medicare Other

## 2017-11-20 DIAGNOSIS — M545 Low back pain: Secondary | ICD-10-CM | POA: Diagnosis not present

## 2017-11-20 DIAGNOSIS — Z79899 Other long term (current) drug therapy: Secondary | ICD-10-CM | POA: Diagnosis not present

## 2017-11-20 DIAGNOSIS — Z79891 Long term (current) use of opiate analgesic: Secondary | ICD-10-CM | POA: Diagnosis not present

## 2017-11-20 DIAGNOSIS — M50022 Cervical disc disorder at C5-C6 level with myelopathy: Secondary | ICD-10-CM | POA: Diagnosis not present

## 2017-11-20 DIAGNOSIS — M5023 Other cervical disc displacement, cervicothoracic region: Secondary | ICD-10-CM | POA: Diagnosis not present

## 2017-11-20 DIAGNOSIS — M47812 Spondylosis without myelopathy or radiculopathy, cervical region: Secondary | ICD-10-CM | POA: Diagnosis not present

## 2017-11-21 ENCOUNTER — Ambulatory Visit (HOSPITAL_COMMUNITY): Payer: Medicare Other

## 2017-12-18 DIAGNOSIS — M50322 Other cervical disc degeneration at C5-C6 level: Secondary | ICD-10-CM | POA: Diagnosis not present

## 2017-12-18 DIAGNOSIS — M5023 Other cervical disc displacement, cervicothoracic region: Secondary | ICD-10-CM | POA: Diagnosis not present

## 2017-12-18 DIAGNOSIS — J449 Chronic obstructive pulmonary disease, unspecified: Secondary | ICD-10-CM | POA: Diagnosis not present

## 2017-12-18 DIAGNOSIS — Z79899 Other long term (current) drug therapy: Secondary | ICD-10-CM | POA: Diagnosis not present

## 2017-12-18 DIAGNOSIS — M545 Low back pain: Secondary | ICD-10-CM | POA: Diagnosis not present

## 2017-12-18 DIAGNOSIS — M542 Cervicalgia: Secondary | ICD-10-CM | POA: Diagnosis not present

## 2017-12-19 ENCOUNTER — Encounter (HOSPITAL_COMMUNITY): Payer: Medicare Other

## 2017-12-19 ENCOUNTER — Encounter: Payer: Medicare Other | Admitting: Vascular Surgery

## 2017-12-22 ENCOUNTER — Encounter: Payer: Self-pay | Admitting: Vascular Surgery

## 2018-01-15 DIAGNOSIS — M542 Cervicalgia: Secondary | ICD-10-CM | POA: Diagnosis not present

## 2018-01-15 DIAGNOSIS — M545 Low back pain: Secondary | ICD-10-CM | POA: Diagnosis not present

## 2018-01-15 DIAGNOSIS — M50322 Other cervical disc degeneration at C5-C6 level: Secondary | ICD-10-CM | POA: Diagnosis not present

## 2018-01-15 DIAGNOSIS — E785 Hyperlipidemia, unspecified: Secondary | ICD-10-CM | POA: Diagnosis not present

## 2018-01-15 DIAGNOSIS — Z79899 Other long term (current) drug therapy: Secondary | ICD-10-CM | POA: Diagnosis not present

## 2018-02-16 DIAGNOSIS — M5023 Other cervical disc displacement, cervicothoracic region: Secondary | ICD-10-CM | POA: Diagnosis not present

## 2018-02-16 DIAGNOSIS — M50022 Cervical disc disorder at C5-C6 level with myelopathy: Secondary | ICD-10-CM | POA: Diagnosis not present

## 2018-02-16 DIAGNOSIS — M542 Cervicalgia: Secondary | ICD-10-CM | POA: Diagnosis not present

## 2018-02-16 DIAGNOSIS — Z79899 Other long term (current) drug therapy: Secondary | ICD-10-CM | POA: Diagnosis not present

## 2018-02-16 DIAGNOSIS — M545 Low back pain: Secondary | ICD-10-CM | POA: Diagnosis not present

## 2018-02-20 ENCOUNTER — Inpatient Hospital Stay (HOSPITAL_COMMUNITY): Admission: RE | Admit: 2018-02-20 | Payer: Medicare Other | Source: Ambulatory Visit

## 2018-02-20 ENCOUNTER — Encounter: Payer: Self-pay | Admitting: Vascular Surgery

## 2018-02-20 ENCOUNTER — Encounter: Payer: Medicare Other | Admitting: Vascular Surgery

## 2018-03-17 DIAGNOSIS — E039 Hypothyroidism, unspecified: Secondary | ICD-10-CM | POA: Diagnosis not present

## 2018-03-17 DIAGNOSIS — E785 Hyperlipidemia, unspecified: Secondary | ICD-10-CM | POA: Diagnosis not present

## 2018-03-17 DIAGNOSIS — I10 Essential (primary) hypertension: Secondary | ICD-10-CM | POA: Diagnosis not present

## 2018-03-19 DIAGNOSIS — Z79899 Other long term (current) drug therapy: Secondary | ICD-10-CM | POA: Diagnosis not present

## 2018-03-19 DIAGNOSIS — M545 Low back pain: Secondary | ICD-10-CM | POA: Diagnosis not present

## 2018-03-19 DIAGNOSIS — Z79891 Long term (current) use of opiate analgesic: Secondary | ICD-10-CM | POA: Diagnosis not present

## 2018-03-19 DIAGNOSIS — M542 Cervicalgia: Secondary | ICD-10-CM | POA: Diagnosis not present

## 2018-04-01 DIAGNOSIS — M5416 Radiculopathy, lumbar region: Secondary | ICD-10-CM | POA: Diagnosis not present

## 2018-04-01 DIAGNOSIS — M542 Cervicalgia: Secondary | ICD-10-CM | POA: Diagnosis not present

## 2018-04-01 DIAGNOSIS — M4722 Other spondylosis with radiculopathy, cervical region: Secondary | ICD-10-CM | POA: Diagnosis not present

## 2018-04-16 DIAGNOSIS — Z79891 Long term (current) use of opiate analgesic: Secondary | ICD-10-CM | POA: Diagnosis not present

## 2018-04-16 DIAGNOSIS — M542 Cervicalgia: Secondary | ICD-10-CM | POA: Diagnosis not present

## 2018-04-16 DIAGNOSIS — M545 Low back pain: Secondary | ICD-10-CM | POA: Diagnosis not present

## 2018-04-16 DIAGNOSIS — R52 Pain, unspecified: Secondary | ICD-10-CM | POA: Diagnosis not present

## 2018-05-20 DIAGNOSIS — I10 Essential (primary) hypertension: Secondary | ICD-10-CM | POA: Diagnosis not present

## 2018-05-20 DIAGNOSIS — M545 Low back pain: Secondary | ICD-10-CM | POA: Diagnosis not present

## 2018-05-20 DIAGNOSIS — Z79891 Long term (current) use of opiate analgesic: Secondary | ICD-10-CM | POA: Diagnosis not present

## 2018-05-20 DIAGNOSIS — M542 Cervicalgia: Secondary | ICD-10-CM | POA: Diagnosis not present

## 2018-05-20 DIAGNOSIS — J441 Chronic obstructive pulmonary disease with (acute) exacerbation: Secondary | ICD-10-CM | POA: Diagnosis not present

## 2018-06-24 DIAGNOSIS — E039 Hypothyroidism, unspecified: Secondary | ICD-10-CM | POA: Diagnosis not present

## 2018-06-24 DIAGNOSIS — M545 Low back pain: Secondary | ICD-10-CM | POA: Diagnosis not present

## 2018-06-24 DIAGNOSIS — Z79891 Long term (current) use of opiate analgesic: Secondary | ICD-10-CM | POA: Diagnosis not present

## 2018-06-24 DIAGNOSIS — G4700 Insomnia, unspecified: Secondary | ICD-10-CM | POA: Diagnosis not present

## 2018-06-24 DIAGNOSIS — M542 Cervicalgia: Secondary | ICD-10-CM | POA: Diagnosis not present

## 2018-08-12 DIAGNOSIS — K589 Irritable bowel syndrome without diarrhea: Secondary | ICD-10-CM | POA: Diagnosis not present

## 2018-08-24 ENCOUNTER — Other Ambulatory Visit: Payer: Self-pay

## 2018-08-24 ENCOUNTER — Inpatient Hospital Stay (HOSPITAL_COMMUNITY)
Admission: EM | Admit: 2018-08-24 | Discharge: 2018-08-27 | DRG: 166 | Disposition: A | Discharge status: Home Health | Payer: Medicare Other | Attending: Family Medicine | Admitting: Family Medicine

## 2018-08-24 ENCOUNTER — Encounter (HOSPITAL_COMMUNITY): Payer: Self-pay

## 2018-08-24 ENCOUNTER — Emergency Department (HOSPITAL_COMMUNITY): Payer: Medicare Other

## 2018-08-24 DIAGNOSIS — J9621 Acute and chronic respiratory failure with hypoxia: Secondary | ICD-10-CM | POA: Diagnosis not present

## 2018-08-24 DIAGNOSIS — I5033 Acute on chronic diastolic (congestive) heart failure: Secondary | ICD-10-CM | POA: Diagnosis present

## 2018-08-24 DIAGNOSIS — F419 Anxiety disorder, unspecified: Secondary | ICD-10-CM | POA: Diagnosis present

## 2018-08-24 DIAGNOSIS — Z882 Allergy status to sulfonamides status: Secondary | ICD-10-CM

## 2018-08-24 DIAGNOSIS — E876 Hypokalemia: Secondary | ICD-10-CM | POA: Diagnosis not present

## 2018-08-24 DIAGNOSIS — E039 Hypothyroidism, unspecified: Secondary | ICD-10-CM | POA: Diagnosis not present

## 2018-08-24 DIAGNOSIS — J440 Chronic obstructive pulmonary disease with acute lower respiratory infection: Secondary | ICD-10-CM | POA: Diagnosis present

## 2018-08-24 DIAGNOSIS — K219 Gastro-esophageal reflux disease without esophagitis: Secondary | ICD-10-CM | POA: Diagnosis not present

## 2018-08-24 DIAGNOSIS — E785 Hyperlipidemia, unspecified: Secondary | ICD-10-CM | POA: Diagnosis not present

## 2018-08-24 DIAGNOSIS — D649 Anemia, unspecified: Secondary | ICD-10-CM | POA: Diagnosis not present

## 2018-08-24 DIAGNOSIS — R945 Abnormal results of liver function studies: Secondary | ICD-10-CM | POA: Diagnosis not present

## 2018-08-24 DIAGNOSIS — N179 Acute kidney failure, unspecified: Secondary | ICD-10-CM

## 2018-08-24 DIAGNOSIS — D509 Iron deficiency anemia, unspecified: Secondary | ICD-10-CM | POA: Diagnosis present

## 2018-08-24 DIAGNOSIS — G9341 Metabolic encephalopathy: Secondary | ICD-10-CM | POA: Diagnosis not present

## 2018-08-24 DIAGNOSIS — Z79890 Hormone replacement therapy (postmenopausal): Secondary | ICD-10-CM | POA: Diagnosis not present

## 2018-08-24 DIAGNOSIS — R7989 Other specified abnormal findings of blood chemistry: Secondary | ICD-10-CM | POA: Diagnosis not present

## 2018-08-24 DIAGNOSIS — R0600 Dyspnea, unspecified: Secondary | ICD-10-CM

## 2018-08-24 DIAGNOSIS — Z7982 Long term (current) use of aspirin: Secondary | ICD-10-CM | POA: Diagnosis not present

## 2018-08-24 DIAGNOSIS — Z9049 Acquired absence of other specified parts of digestive tract: Secondary | ICD-10-CM | POA: Diagnosis not present

## 2018-08-24 DIAGNOSIS — I700 Atherosclerosis of aorta: Secondary | ICD-10-CM | POA: Diagnosis not present

## 2018-08-24 DIAGNOSIS — J441 Chronic obstructive pulmonary disease with (acute) exacerbation: Secondary | ICD-10-CM | POA: Diagnosis present

## 2018-08-24 DIAGNOSIS — J90 Pleural effusion, not elsewhere classified: Secondary | ICD-10-CM

## 2018-08-24 DIAGNOSIS — Z1159 Encounter for screening for other viral diseases: Secondary | ICD-10-CM

## 2018-08-24 DIAGNOSIS — G8929 Other chronic pain: Secondary | ICD-10-CM | POA: Diagnosis present

## 2018-08-24 DIAGNOSIS — I361 Nonrheumatic tricuspid (valve) insufficiency: Secondary | ICD-10-CM | POA: Diagnosis not present

## 2018-08-24 DIAGNOSIS — F418 Other specified anxiety disorders: Secondary | ICD-10-CM | POA: Diagnosis present

## 2018-08-24 DIAGNOSIS — K922 Gastrointestinal hemorrhage, unspecified: Secondary | ICD-10-CM | POA: Diagnosis not present

## 2018-08-24 DIAGNOSIS — Z90710 Acquired absence of both cervix and uterus: Secondary | ICD-10-CM

## 2018-08-24 DIAGNOSIS — Z8659 Personal history of other mental and behavioral disorders: Secondary | ICD-10-CM

## 2018-08-24 DIAGNOSIS — I739 Peripheral vascular disease, unspecified: Secondary | ICD-10-CM | POA: Diagnosis present

## 2018-08-24 DIAGNOSIS — Z888 Allergy status to other drugs, medicaments and biological substances status: Secondary | ICD-10-CM

## 2018-08-24 DIAGNOSIS — F17210 Nicotine dependence, cigarettes, uncomplicated: Secondary | ICD-10-CM | POA: Diagnosis present

## 2018-08-24 DIAGNOSIS — F32A Depression, unspecified: Secondary | ICD-10-CM | POA: Diagnosis present

## 2018-08-24 DIAGNOSIS — Z79899 Other long term (current) drug therapy: Secondary | ICD-10-CM | POA: Diagnosis not present

## 2018-08-24 DIAGNOSIS — K921 Melena: Secondary | ICD-10-CM | POA: Diagnosis not present

## 2018-08-24 DIAGNOSIS — R17 Unspecified jaundice: Secondary | ICD-10-CM | POA: Diagnosis not present

## 2018-08-24 DIAGNOSIS — I110 Hypertensive heart disease with heart failure: Secondary | ICD-10-CM | POA: Diagnosis not present

## 2018-08-24 DIAGNOSIS — I509 Heart failure, unspecified: Secondary | ICD-10-CM | POA: Diagnosis not present

## 2018-08-24 DIAGNOSIS — J9601 Acute respiratory failure with hypoxia: Secondary | ICD-10-CM | POA: Diagnosis not present

## 2018-08-24 DIAGNOSIS — R0902 Hypoxemia: Secondary | ICD-10-CM

## 2018-08-24 DIAGNOSIS — Z91048 Other nonmedicinal substance allergy status: Secondary | ICD-10-CM

## 2018-08-24 DIAGNOSIS — G458 Other transient cerebral ischemic attacks and related syndromes: Secondary | ICD-10-CM | POA: Diagnosis not present

## 2018-08-24 DIAGNOSIS — G43909 Migraine, unspecified, not intractable, without status migrainosus: Secondary | ICD-10-CM | POA: Diagnosis not present

## 2018-08-24 DIAGNOSIS — Z885 Allergy status to narcotic agent status: Secondary | ICD-10-CM

## 2018-08-24 DIAGNOSIS — Z91040 Latex allergy status: Secondary | ICD-10-CM

## 2018-08-24 DIAGNOSIS — Z8673 Personal history of transient ischemic attack (TIA), and cerebral infarction without residual deficits: Secondary | ICD-10-CM

## 2018-08-24 DIAGNOSIS — J189 Pneumonia, unspecified organism: Secondary | ICD-10-CM | POA: Diagnosis present

## 2018-08-24 DIAGNOSIS — Z7951 Long term (current) use of inhaled steroids: Secondary | ICD-10-CM | POA: Diagnosis not present

## 2018-08-24 DIAGNOSIS — R0602 Shortness of breath: Secondary | ICD-10-CM | POA: Diagnosis not present

## 2018-08-24 DIAGNOSIS — I10 Essential (primary) hypertension: Secondary | ICD-10-CM | POA: Diagnosis present

## 2018-08-24 DIAGNOSIS — Z9889 Other specified postprocedural states: Secondary | ICD-10-CM

## 2018-08-24 DIAGNOSIS — F329 Major depressive disorder, single episode, unspecified: Secondary | ICD-10-CM | POA: Diagnosis present

## 2018-08-24 DIAGNOSIS — R0689 Other abnormalities of breathing: Secondary | ICD-10-CM

## 2018-08-24 DIAGNOSIS — Z886 Allergy status to analgesic agent status: Secondary | ICD-10-CM

## 2018-08-24 DIAGNOSIS — Z8249 Family history of ischemic heart disease and other diseases of the circulatory system: Secondary | ICD-10-CM

## 2018-08-24 DIAGNOSIS — Z881 Allergy status to other antibiotic agents status: Secondary | ICD-10-CM

## 2018-08-24 HISTORY — DX: Chronic obstructive pulmonary disease, unspecified: J44.9

## 2018-08-24 LAB — CBC WITH DIFFERENTIAL/PLATELET
Abs Immature Granulocytes: 0.16 10*3/uL — ABNORMAL HIGH (ref 0.00–0.07)
Basophils Absolute: 0 10*3/uL (ref 0.0–0.1)
Basophils Relative: 0 %
Eosinophils Absolute: 0 10*3/uL (ref 0.0–0.5)
Eosinophils Relative: 0 %
HCT: 30.1 % — ABNORMAL LOW (ref 36.0–46.0)
Hemoglobin: 7.9 g/dL — ABNORMAL LOW (ref 12.0–15.0)
Immature Granulocytes: 1 %
Lymphocytes Relative: 10 %
Lymphs Abs: 1.3 10*3/uL (ref 0.7–4.0)
MCH: 21.1 pg — ABNORMAL LOW (ref 26.0–34.0)
MCHC: 26.2 g/dL — ABNORMAL LOW (ref 30.0–36.0)
MCV: 80.3 fL (ref 80.0–100.0)
Monocytes Absolute: 0.9 10*3/uL (ref 0.1–1.0)
Monocytes Relative: 7 %
Neutro Abs: 10 10*3/uL — ABNORMAL HIGH (ref 1.7–7.7)
Neutrophils Relative %: 82 %
Platelets: 363 10*3/uL (ref 150–400)
RBC: 3.75 MIL/uL — ABNORMAL LOW (ref 3.87–5.11)
RDW: 23.3 % — ABNORMAL HIGH (ref 11.5–15.5)
WBC: 12.4 10*3/uL — ABNORMAL HIGH (ref 4.0–10.5)
nRBC: 0.2 % (ref 0.0–0.2)

## 2018-08-24 LAB — COMPREHENSIVE METABOLIC PANEL
ALT: 118 U/L — ABNORMAL HIGH (ref 0–44)
AST: 263 U/L — ABNORMAL HIGH (ref 15–41)
Albumin: 3.2 g/dL — ABNORMAL LOW (ref 3.5–5.0)
Alkaline Phosphatase: 155 U/L — ABNORMAL HIGH (ref 38–126)
Anion gap: 12 (ref 5–15)
BUN: 51 mg/dL — ABNORMAL HIGH (ref 8–23)
CO2: 33 mmol/L — ABNORMAL HIGH (ref 22–32)
Calcium: 8.4 mg/dL — ABNORMAL LOW (ref 8.9–10.3)
Chloride: 96 mmol/L — ABNORMAL LOW (ref 98–111)
Creatinine, Ser: 2.47 mg/dL — ABNORMAL HIGH (ref 0.44–1.00)
GFR calc Af Amer: 24 mL/min — ABNORMAL LOW (ref >60)
GFR calc non Af Amer: 20 mL/min — ABNORMAL LOW (ref >60)
Glucose, Bld: 144 mg/dL — ABNORMAL HIGH (ref 70–99)
Potassium: 2.6 mmol/L — CL (ref 3.5–5.1)
Sodium: 141 mmol/L (ref 135–145)
Total Bilirubin: 1.6 mg/dL — ABNORMAL HIGH (ref 0.3–1.2)
Total Protein: 6.4 g/dL — ABNORMAL LOW (ref 6.5–8.1)

## 2018-08-24 LAB — POC OCCULT BLOOD, ED: Fecal Occult Bld: POSITIVE — AB

## 2018-08-24 LAB — PROTIME-INR
INR: 1.2 (ref 0.8–1.2)
Prothrombin Time: 14.7 seconds (ref 11.4–15.2)

## 2018-08-24 LAB — IRON AND TIBC
Iron: 9 ug/dL — ABNORMAL LOW (ref 28–170)
Saturation Ratios: 2 % — ABNORMAL LOW (ref 10.4–31.8)
TIBC: 479 ug/dL — ABNORMAL HIGH (ref 250–450)
UIBC: 470 ug/dL

## 2018-08-24 LAB — AMMONIA: Ammonia: 19 umol/L (ref 9–35)

## 2018-08-24 LAB — ETHANOL: Alcohol, Ethyl (B): <10 mg/dL (ref <10)

## 2018-08-24 LAB — MAGNESIUM: Magnesium: 2 mg/dL (ref 1.7–2.4)

## 2018-08-24 LAB — FERRITIN: Ferritin: 11 ng/mL (ref 11–307)

## 2018-08-24 LAB — ABO/RH: ABO/RH(D): AB NEG

## 2018-08-24 LAB — SARS CORONAVIRUS 2 BY RT PCR (HOSPITAL ORDER, PERFORMED IN ~~LOC~~ HOSPITAL LAB): SARS Coronavirus 2: NEGATIVE

## 2018-08-24 LAB — TSH: TSH: 3.669 u[IU]/mL (ref 0.350–4.500)

## 2018-08-24 LAB — ACETAMINOPHEN LEVEL: Acetaminophen (Tylenol), Serum: <10 ug/mL — ABNORMAL LOW (ref 10–30)

## 2018-08-24 LAB — BRAIN NATRIURETIC PEPTIDE: B Natriuretic Peptide: 1260 pg/mL — ABNORMAL HIGH (ref 0.0–100.0)

## 2018-08-24 MED ORDER — ALBUTEROL SULFATE HFA 108 (90 BASE) MCG/ACT IN AERS: 3.0000 | INHALATION_SPRAY | RESPIRATORY_TRACT | Status: DC | PRN

## 2018-08-24 MED ORDER — METHYLPREDNISOLONE SODIUM SUCC 125 MG IJ SOLR
60.0000 mg | Freq: Two times a day (BID) | INTRAMUSCULAR | Status: DC
  Administered 2018-08-24 – 2018-08-25 (×3): 60 mg via INTRAVENOUS
  Filled 2018-08-24 (×3): qty 2

## 2018-08-24 MED ORDER — ACETAMINOPHEN 650 MG RE SUPP: 650.0000 mg | Freq: Four times a day (QID) | RECTAL | Status: DC | PRN

## 2018-08-24 MED ORDER — ACETAMINOPHEN 325 MG PO TABS: 650.0000 mg | ORAL_TABLET | Freq: Four times a day (QID) | ORAL | Status: DC | PRN

## 2018-08-24 MED ORDER — POTASSIUM CHLORIDE CRYS ER 20 MEQ PO TBCR
40.0000 meq | EXTENDED_RELEASE_TABLET | ORAL | Status: AC
  Administered 2018-08-24 (×3): 40 meq via ORAL
  Filled 2018-08-24 (×3): qty 2

## 2018-08-24 MED ORDER — ONDANSETRON HCL 4 MG PO TABS: 4.0000 mg | ORAL_TABLET | Freq: Four times a day (QID) | ORAL | Status: DC | PRN

## 2018-08-24 MED ORDER — ONDANSETRON HCL 4 MG/2ML IJ SOLN
4.0000 mg | Freq: Four times a day (QID) | INTRAMUSCULAR | Status: DC | PRN
  Administered 2018-08-26: 09:00:00 4 mg via INTRAVENOUS
  Filled 2018-08-24: qty 2

## 2018-08-24 MED ORDER — SODIUM CHLORIDE 0.9 % IV SOLN
500.0000 mg | Freq: Once | INTRAVENOUS | Status: AC
  Administered 2018-08-24: 500 mg via INTRAVENOUS
  Filled 2018-08-24: qty 500

## 2018-08-24 MED ORDER — FUROSEMIDE 10 MG/ML IJ SOLN
20.0000 mg | Freq: Two times a day (BID) | INTRAMUSCULAR | Status: DC
  Administered 2018-08-24 – 2018-08-25 (×3): 20 mg via INTRAVENOUS
  Filled 2018-08-24 (×3): qty 2

## 2018-08-24 MED ORDER — IPRATROPIUM-ALBUTEROL 0.5-2.5 (3) MG/3ML IN SOLN: 3.0000 mL | RESPIRATORY_TRACT | Status: DC | PRN

## 2018-08-24 MED ORDER — SODIUM CHLORIDE 0.9 % IV SOLN: 1.0000 g | INTRAVENOUS | Status: DC

## 2018-08-24 MED ORDER — SODIUM CHLORIDE 0.9 % IV SOLN
500.0000 mg | INTRAVENOUS | Status: DC
  Administered 2018-08-25 – 2018-08-27 (×3): 500 mg via INTRAVENOUS
  Filled 2018-08-24 (×3): qty 500

## 2018-08-24 MED ORDER — SODIUM CHLORIDE 0.9 % IV SOLN
1.0000 g | Freq: Once | INTRAVENOUS | Status: AC
  Administered 2018-08-24: 1 g via INTRAVENOUS
  Filled 2018-08-24: qty 10

## 2018-08-24 MED ORDER — IPRATROPIUM-ALBUTEROL 0.5-2.5 (3) MG/3ML IN SOLN
3.0000 mL | Freq: Four times a day (QID) | RESPIRATORY_TRACT | Status: AC
  Administered 2018-08-24 – 2018-08-25 (×4): 3 mL via RESPIRATORY_TRACT
  Filled 2018-08-24 (×4): qty 3

## 2018-08-24 MED ORDER — ENOXAPARIN SODIUM 30 MG/0.3ML ~~LOC~~ SOLN
30.0000 mg | SUBCUTANEOUS | Status: DC
  Administered 2018-08-24: 30 mg via SUBCUTANEOUS
  Filled 2018-08-24: qty 0.3

## 2018-08-24 MED ORDER — POLYETHYLENE GLYCOL 3350 17 G PO PACK: 17.0000 g | PACK | Freq: Every day | ORAL | Status: DC | PRN

## 2018-08-24 MED ORDER — SODIUM CHLORIDE 0.9 % IV SOLN
2.0000 g | INTRAVENOUS | Status: DC
  Administered 2018-08-25 – 2018-08-27 (×3): 2 g via INTRAVENOUS
  Filled 2018-08-24 (×3): qty 20

## 2018-08-24 NOTE — ED Notes (Signed)
Spoke with pt's husband who reports pt has been passing blood as he saw it in her panties when he did laundry last week. Reports pt did not tell him about this but has a history of same with admission to [facility].

## 2018-08-24 NOTE — H&P (Addendum)
History and Physical    [name] HEN:[id number] DOB: 18-Jul-1955 DOA: 08/24/2018  PCP: Jani Gravel, MD   Patient coming from: Home  I have personally briefly reviewed patient's old medical records in [place]  Chief Complaint: Weakness  HPI: [name] is a 63 y.o. female with medical history significant for CHF, hypothyroidism, peripheral vascular disease, COPD, depression anxiety, hypertension, subclavian steal syndrome, CVA. At the time of my evaluation patient is alert and oriented, able to give me a history.  Who presented to the ED with complaints of generalized weakness of 2 weeks, and confusion at night.  Patient reports over the past 2 weeks she has barely eaten or drank anything that she has felt so depressed.  No abdominal pain, no vomiting, she reports some occasional loose stools.  She reports chronic difficulty breathing, but is unable to tell me if this has changed recently.  She denies cough.  No chest pain.  No fever or chills.  She reports bilateral lower extremity swelling of about 2 weeks duration now.  She reports her normal weight is about  115 pounds.  She denies being on any fluid pills.  She denies vomiting of blood, denies black stools or bloody stools.  She denies NSAID use.  She is unaware of a history of heart disease or stroke, though both of these are listed on her medical problems. Patient is supposed to be on CPAP at night and home oxygen, both of which she has not used in at least 4 years.  Per patient spouse-he has been seeing blood in patient's underwear, and patient has been admitted to [facility] for same.  ED Course: Temperature 98.  Respiratory rate 17-24, blood pressure systolic 1 1 7-1 2 9, O2 sats 68% on room air.  Patient was initially lethargic and somewhat confused, this improved after patient was placed on 2 L nasal cannula she became more alert and oriented.  After placing patient on O2 by nasal cannula, patient's been BNP  elevated at 1260.  Mild leukocytosis 12.4.  Low potassium 2.6.  Elevated creatinine 2.47.  Elevated bicarb 33.  Hemoglobin 7.9.  EKG without significant change from prior.  Stool occult positive. Portable chest x-ray shows interval cardiomegaly and moderate-sized right pleural effusion with right basilar atelectasis or pneumonia. Patient started on IV ceftriaxone and azithromycin.  Hospitalist to admit for community-acquired pneumonia.  Review of Systems: As per HPI all other systems reviewed and negative.  Past Medical History:  Diagnosis Date  . Anemia   . Anxiety and depression   . Bronchitis   . Cancer ([place])    uterus  . CHF (congestive heart failure) ([place])   . Chronic pain 11/22/2010  . COPD (chronic obstructive pulmonary disease) ([place])   . Gastroesophageal reflux disease 11/22/2010  . Hyperlipidemia   . Hypertension   . Hypothyroid   . Migraine headache 11/22/2010  . Near syncope   . Peripheral vascular disease ([place])   . Pneumonia   . Tobacco abuse    1/2 pack per day    Past Surgical History:  Procedure Laterality Date  . ABDOMINAL HYSTERECTOMY     without oophorectomy for neoplastic disease  . BACK SURGERY    . CARDIAC CATHETERIZATION  07/2013   normal coronary arteries  . CARPAL TUNNEL RELEASE     Left  . CHOLECYSTECTOMY  2007  . DILATION AND CURETTAGE OF UTERUS    . LAPAROTOMY    . LEFT HEART CATHETERIZATION WITH CORONARY  ANGIOGRAM N/A 07/12/2013   Procedure: LEFT HEART CATHETERIZATION WITH CORONARY ANGIOGRAM;  Surgeon: Leonie Man, MD;  Location: [facility] CATH LAB;  Service: Cardiovascular;  Laterality: N/A;  . LUMBAR LAMINECTOMY  x3   Left; complicated by neurologic dysfunction  . NASAL SINUS SURGERY       reports that she has been smoking cigarettes. She started smoking about 45 years ago. She has a 20.00 pack-year smoking history. She has never used smokeless tobacco. She reports that she does not drink alcohol or use drugs.  Allergies  Allergen Reactions   . Acetaminophen-Codeine Nausea And Vomiting  . Aspirin Nausea And Vomiting    (can take coated aspirin)  . Celecoxib Other (See Comments)    "does the opposite of what it is suppose to do"  . Levaquin [Levofloxacin In D5w] Itching  . Lipitor [Atorvastatin] Other (See Comments)    Myalgias   . Sulfa Antibiotics Nausea And Vomiting  . Adhesive [Tape] Rash  . Latex Rash    Family History  Problem Relation Age of Onset  . Colon polyps Mother   . Deep vein thrombosis Mother   . Heart disease Mother   . Hypertension Mother   . Bleeding Disorder Mother   . Hypertension Sister   . Heart disease Brother        before age 54  . Hypertension Brother      Prior to Admission medications   Medication Sig Start Date End Date Taking? Authorizing Provider  albuterol (PROAIR HFA) 108 (90 BASE) MCG/ACT inhaler Inhale 2 puffs into the lungs every 6 (six) hours as needed for wheezing or shortness of breath.    [provider]  albuterol (PROVENTIL) (2.5 MG/3ML) 0.083% nebulizer solution Take 3 mLs (2.5 mg total) by nebulization every 6 (six) hours as needed for wheezing or shortness of breath. 03/22/14   Kathie Dike, MD  ALPRAZolam Duanne Moron) 1 MG tablet Take 1 tablet (1 mg total) by mouth 3 (three) times daily as needed for anxiety. 12/22/13   Ward, Delice Bison, DO  aspirin EC 81 MG tablet Take 81 mg by mouth at bedtime.    [provider]  azithromycin (ZITHROMAX) 500 MG tablet Take 1 tablet (500 mg total) by mouth daily. 03/22/14   Kathie Dike, MD  cefUROXime (CEFTIN) 500 MG tablet Take 1 tablet (500 mg total) by mouth 2 (two) times daily with a meal. 03/22/14   Kathie Dike, MD  Cholecalciferol (VITAMIN D PO) Take 1 tablet by mouth daily.    [provider]  DULoxetine (CYMBALTA) 60 MG capsule Take 1 capsule (60 mg total) by mouth 2 (two) times daily. 12/22/13   Ward, Delice Bison, DO  fluticasone (FLONASE) 50 MCG/ACT nasal spray Place 2 sprays into both nostrils  daily.  11/26/13   [provider]  guaiFENesin (MUCINEX) 600 MG 12 hr tablet Take 1 tablet (600 mg total) by mouth 2 (two) times daily. 03/22/14   Kathie Dike, MD  HYDROcodone-acetaminophen (NORCO) 7.5-325 MG per tablet Take 1 tablet by mouth every 6 (six) hours as needed for moderate pain. 10/18/14   Evalee Jefferson, PA-C  levothyroxine (LEVOTHROID) 25 MCG tablet Take 25 mcg by mouth every morning.     [provider]  metroNIDAZOLE (FLAGYL) 500 MG tablet Take 1 tablet (500 mg total) by mouth every 8 (eight) hours. 03/22/14   Kathie Dike, MD  Potassium 99 MG TABS Take 99 mg by mouth daily.    [provider]  predniSONE (DELTASONE) 10  MG tablet 6, 5, 4, 3, 2 then 1 tablet by mouth daily for 6 days total. 10/18/14   Idol, Almyra Free, PA-C  saccharomyces boulardii (FLORASTOR) 250 MG capsule Take 1 capsule (250 mg total) by mouth 2 (two) times daily. 03/22/14   Kathie Dike, MD  tiotropium (SPIRIVA HANDIHALER) 18 MCG inhalation capsule Place 1 capsule (18 mcg total) into inhaler and inhale daily. 03/22/14   Kathie Dike, MD  topiramate (TOPAMAX) 25 MG tablet Take 50 mg by mouth 2 (two) times daily.  11/26/13   [provider]  varenicline (CHANTIX STARTING MONTH PAK) 0.5 MG X 11 & 1 MG X 42 tablet Take one 0.5 mg tablet by mouth once daily for 3 days, then increase to one 0.5 mg tablet twice daily for 4 days, then increase to one 1 mg tablet twice daily. 03/02/15   Herminio Commons, MD  VITAMIN E PO Take 1 capsule by mouth daily.    [provider]    Physical Exam: Vitals:   08/24/18 1337 08/24/18 1338 08/24/18 1344 08/24/18 1430  BP:  (!) 117/97  (!) 129/55  Pulse:  87  65  Resp:  (!) 24  17  Temp:  98 F (36.7 C)    TempSrc:  Oral    SpO2: (!) 68% (!) 68% (!) 89% 99%    Constitutional: Appears weak, calm, comfortable Vitals:   08/24/18 1337 08/24/18 1338 08/24/18 1344 08/24/18 1430  BP:  (!) 117/97  (!) 129/55  Pulse:  87  65  Resp:  (!)  24  17  Temp:  98 F (36.7 C)    TempSrc:  Oral    SpO2: (!) 68% (!) 68% (!) 89% 99%   Eyes: PERRL, lids and conjunctivae normal ENMT: Mucous membranes are moist. Posterior pharynx clear of any exudate or lesions.  Neck: normal, supple, no masses, no thyromegaly Respiratory: Diffuse expiratory wheezing, normal respiratory effort. No accessory muscle use.  Cardiovascular: Regular rate and rhythm, no murmurs / rubs / gallops.  1+ bilateral pitting pedal edema to knees. 2+ pedal pulses.  Abdomen: no tenderness, no masses palpated. No hepatosplenomegaly. Bowel sounds positive.  Musculoskeletal: no clubbing / cyanosis. No joint deformity upper and lower extremities. Good ROM, no contractures. Normal muscle tone.  Skin: no rashes, lesions, ulcers. No induration Neurologic: CN 2-12 grossly intact. Strength mostly 4 /5 in all 4, patient appears generally weak. Psychiatric: Normal judgment and insight. Alert and oriented x 3. Normal mood.   Labs on Admission: I have personally reviewed following labs and imaging studies  CBC: Recent Labs  Lab 08/24/18 1408  WBC 12.4*  NEUTROABS 10.0*  HGB 7.9*  HCT 30.1*  MCV 80.3  PLT 706   Basic Metabolic Panel: Recent Labs  Lab 08/24/18 1408  NA 141  K 2.6*  CL 96*  CO2 33*  GLUCOSE 144*  BUN 51*  CREATININE 2.47*  CALCIUM 8.4*   GFR: CrCl cannot be calculated (Unknown ideal weight.). Liver Function Tests: Recent Labs  Lab 08/24/18 1408  AST 263*  ALT 118*  ALKPHOS 155*  BILITOT 1.6*  PROT 6.4*  ALBUMIN 3.2*   Coagulation Profile: Recent Labs  Lab 08/24/18 1529  INR 1.2   Thyroid Function Tests: Recent Labs    08/24/18 1408  TSH 3.669    Radiological Exams on Admission: Dg Chest Portable 1 View  Result Date: 08/24/2018 CLINICAL DATA:  Shortness of breath. Generalized weakness for the past 2 weeks. Smoker. EXAM: PORTABLE CHEST 1 VIEW COMPARISON:  05/16/2016. FINDINGS: Interval enlarged cardiac silhouette and  moderate-sized right pleural effusion with right basilar airspace opacity. Mild diffuse peribronchial thickening with mild progression. Stable old, healed left 6th rib fracture. IMPRESSION: 1. Interval cardiomegaly and moderate-sized right pleural effusion with right basilar atelectasis or pneumonia. 2. Mild bronchitic changes with mild progression. Electronically Signed   By: Claudie Revering M.D.   On: 08/24/2018 14:28    EKG: Independently reviewed.  Sinus rhythm.  Rate 83.  QTc 450.  No significant ST or T wave changes compared to prior EKG.  Assessment/Plan Principal Problem:   Acute respiratory failure with hypoxia (HCC) Active Problems:   Anxiety and depression   HTN (hypertension)   COPD exacerbation (HCC)   Hypokalemia    Acute respiratory failure with hypoxia-with reported dyspnea, with hypoxia O2 sats 68% on room air improved with nasal cannula.  Now on 2 L.  With mild leukocytosis 12.4.  Etiology likely combination of effusion and pneumonia, with COPD exacerbation.  Two-view chest x-ray shows moderate-sized right pleural effusion with right basilar atelectasis or pneumonia. Considering severity of hypoxia will treat for pneumonia. -Continue empiric CAP coverage with IV ceftriaxone and azithromycin started in ED -CBC a.m.  CHF history- decompensated.  New 1+ pitting pedal edema bilaterally, marked elevated BNP 1260 ( was 66  four years ago) chest x-ray with moderate sized right pleural effusion, no echo on file.  Not on diuretics.  She is unaware if she has gained weight.  Denies chest pain.  EKG unchanged.  Elevated creatinine, 2.47. -IV Lasix 20 mg q12h,   -Strict input output, daily weights -BMP a.m. -Echocardiogram  Acute kidney injury-creatinine 2.47.  Last check 4 years ago baseline 0.6 - 0.7.  Appears to be cardiorenal-with 1+ pitting pedal edema, marked elevated BNP.  But patient reports barely any p.o. intake in the past 2 weeks- so I concerned this might be prerenal instead.  -IV Lasix 20 mg twice daily pending clinical response, and Cr in a.m, may need to increase dose or discontinue -BMP a.m.  Hypokalemia-potassium 2.6, with normal magnesium 2.  Likely from poor p.o. intake over the past 2 weeks, ?? the occasional diarrhea. -Replete -BMP a.m.  Generalized weakness-likely multifactorial from hypokalemia, depression, poor p.o. intake, pneumonia, and hypoxia, possibly anemia contributing. -Plan per above  Metabolic encephalopathy-likely from hypoxia and probably pneumonia.  Mental status improved with oxygen by nasal cannula.  Acute anemia-hemoglobin 7.9.  Last check 4 years ago baseline 11-12.  Stool occult positive.  She denies black stools or blood loss, or abdominal pain..  Denies NSAID use.  No Colonoscopy or EGD on file.  Per patient spouse-he has been seeing blood in patient's underwear, and patient has been admitted to [facility] for same. - CBC a.m - Iron studies  COPD exacerbation-breakthrough wheezing on exam. Patient hypoxic.  She used to be on CPAP and home O2 but has not used in at least 4 years.  Bicarb 33, chronically elevated likely reflecting chronic respiratory failure.  Still smokes, about 2 cigarettes/day. -Duo nebs PRN, scheduled -IV Solu-Medrol 60 twice daily -Antibiotics  Anxiety/depression-reports depressed feelings over the past 2 weeks, which resulted in poor p.o. intake.  Denies suicidal or homicidal ideations.  She is unable to tell me what medications she takes -Please reconsult TTS prior to discharge,I initially placed order, but they ask that we reconsult when patient is medically stable. -Resume home meds pending med reconciliation   DVT prophylaxis: SCDs Code Status: Full Family Communication: None at bedside Disposition  Plan: Per rounding team Consults called: None Admission status: Inpatient, telemetry I certify that at the point of admission it is my clinical judgment that the patient will require inpatient  hospital care spanning beyond 2 midnights from the point of admission due to high intensity of service, high risk for further deterioration and high frequency of surveillance required. The following factors support the patient status of inpatient: Multiple medical problems that would need to be addressed as inpatient including community-acquired pneumonia requiring supplemental O2 and possible fluid overload.   Bethena Roys MD [facility]  08/24/2018, 4:40 PM

## 2018-08-24 NOTE — ED Notes (Signed)
Upon pt assessment, sats slowly improved to 98% on 4L. Decreased to 2L [place]. Pt tolerating well and is becoming more alert and oriented. Was initially lethargic and somewhat confused. Was incontinent of stool, which pt does not remember. Pt cleaned of this.

## 2018-08-24 NOTE — ED Triage Notes (Addendum)
Pt presents to ED with complaints of generalized weakness x 2 weeks, SOB and decreased appetite. Pt states she has been more depressed lately. Pt denies SI/HI.  Pt states she is suppose to be on O2 at home but "has not been on it in a long time." Pt states she has been confused at night.

## 2018-08-24 NOTE — ED Provider Notes (Signed)
[facility] EMERGENCY DEPARTMENT Provider Note   CSN: [id number] Arrival date & time: 08/24/18  1321    History   Chief Complaint Chief Complaint  Patient presents with  . Weakness    HPI [name] is a 63 y.o. female.      Weakness Associated symptoms: abdominal pain and shortness of breath   Associated symptoms: no chest pain and no fever    Patient brought in reportedly confusion and generalized weakness.  Has been feeling bad for last couple weeks.  States he is been very depressed peer denies suicidality.  Reportedly had previously been on oxygen for her COPD but states she is been off it for like 4 years.  Upon EMS arrival had sats of 68% on room air.  Has been more confused at night.  Decreased appetite.  Decreased oral intake.  Had been off her medicines for around a month and started back on 2 weeks ago.  No fevers.  Unchanged cough.  Some occasional dull abdominal pain. Past Medical History:  Diagnosis Date  . Anemia   . Anxiety and depression   . Bronchitis   . Cancer ([place])    uterus  . CHF (congestive heart failure) ([place])   . Chronic pain 11/22/2010  . COPD (chronic obstructive pulmonary disease) ([place])   . Gastroesophageal reflux disease 11/22/2010  . Hyperlipidemia   . Hypertension   . Hypothyroid   . Migraine headache 11/22/2010  . Near syncope   . Peripheral vascular disease ([place])   . Pneumonia   . Tobacco abuse    1/2 pack per day    Patient Active Problem List   Diagnosis Date Noted  . COPD exacerbation ([place]) 03/18/2014  . Acute kidney injury ([place]) 03/18/2014  . Acute respiratory failure with hypoxia ([place]) 03/18/2014  . CAP (community acquired pneumonia) 03/17/2014  . Pneumonia 03/17/2014  . HTN (hypertension) 12/03/2012  . Sepsis ([place]) 02/21/2012  . PNA (pneumonia) 02/21/2012  . Adrenal insufficiency ([place]) 02/21/2012  . Subclavian steal syndrome 02/21/2012  . Chronic pain 11/22/2010  . Migraine headache 11/22/2010  .  Gastroesophageal reflux disease 11/22/2010  . Laboratory test 11/22/2010  . Cerebrovascular disease 07/24/2010  . Anxiety and depression   . Hyperlipidemia   . Hypothyroid   . Tobacco abuse     Past Surgical History:  Procedure Laterality Date  . ABDOMINAL HYSTERECTOMY     without oophorectomy for neoplastic disease  . BACK SURGERY    . CARDIAC CATHETERIZATION  07/2013   normal coronary arteries  . CARPAL TUNNEL RELEASE     Left  . CHOLECYSTECTOMY  2007  . DILATION AND CURETTAGE OF UTERUS    . LAPAROTOMY    . LEFT HEART CATHETERIZATION WITH CORONARY ANGIOGRAM N/A 07/12/2013   Procedure: LEFT HEART CATHETERIZATION WITH CORONARY ANGIOGRAM;  Surgeon: Leonie Man, MD;  Location: [facility] CATH LAB;  Service: Cardiovascular;  Laterality: N/A;  . LUMBAR LAMINECTOMY  x3   Left; complicated by neurologic dysfunction  . NASAL SINUS SURGERY       OB History    Gravida      Para      Term      Preterm      AB      Living  2     SAB      TAB      Ectopic      Multiple      Live Births  Home Medications    Prior to Admission medications   Medication Sig Start Date End Date Taking? Authorizing Provider  albuterol (PROAIR HFA) 108 (90 BASE) MCG/ACT inhaler Inhale 2 puffs into the lungs every 6 (six) hours as needed for wheezing or shortness of breath.    [provider]  albuterol (PROVENTIL) (2.5 MG/3ML) 0.083% nebulizer solution Take 3 mLs (2.5 mg total) by nebulization every 6 (six) hours as needed for wheezing or shortness of breath. 03/22/14   Kathie Dike, MD  ALPRAZolam Duanne Moron) 1 MG tablet Take 1 tablet (1 mg total) by mouth 3 (three) times daily as needed for anxiety. 12/22/13   Ward, Delice Bison, DO  aspirin EC 81 MG tablet Take 81 mg by mouth at bedtime.    [provider]  azithromycin (ZITHROMAX) 500 MG tablet Take 1 tablet (500 mg total) by mouth daily. 03/22/14   Kathie Dike, MD  cefUROXime (CEFTIN) 500 MG tablet Take 1  tablet (500 mg total) by mouth 2 (two) times daily with a meal. 03/22/14   Kathie Dike, MD  Cholecalciferol (VITAMIN D PO) Take 1 tablet by mouth daily.    [provider]  DULoxetine (CYMBALTA) 60 MG capsule Take 1 capsule (60 mg total) by mouth 2 (two) times daily. 12/22/13   Ward, Delice Bison, DO  fluticasone (FLONASE) 50 MCG/ACT nasal spray Place 2 sprays into both nostrils daily.  11/26/13   [provider]  guaiFENesin (MUCINEX) 600 MG 12 hr tablet Take 1 tablet (600 mg total) by mouth 2 (two) times daily. 03/22/14   Kathie Dike, MD  HYDROcodone-acetaminophen (NORCO) 7.5-325 MG per tablet Take 1 tablet by mouth every 6 (six) hours as needed for moderate pain. 10/18/14   Evalee Jefferson, PA-C  levothyroxine (LEVOTHROID) 25 MCG tablet Take 25 mcg by mouth every morning.     [provider]  metroNIDAZOLE (FLAGYL) 500 MG tablet Take 1 tablet (500 mg total) by mouth every 8 (eight) hours. 03/22/14   Kathie Dike, MD  Potassium 99 MG TABS Take 99 mg by mouth daily.    [provider]  predniSONE (DELTASONE) 10 MG tablet 6, 5, 4, 3, 2 then 1 tablet by mouth daily for 6 days total. 10/18/14   Idol, Almyra Free, PA-C  saccharomyces boulardii (FLORASTOR) 250 MG capsule Take 1 capsule (250 mg total) by mouth 2 (two) times daily. 03/22/14   Kathie Dike, MD  tiotropium (SPIRIVA HANDIHALER) 18 MCG inhalation capsule Place 1 capsule (18 mcg total) into inhaler and inhale daily. 03/22/14   Kathie Dike, MD  topiramate (TOPAMAX) 25 MG tablet Take 50 mg by mouth 2 (two) times daily.  11/26/13   [provider]  varenicline (CHANTIX STARTING MONTH PAK) 0.5 MG X 11 & 1 MG X 42 tablet Take one 0.5 mg tablet by mouth once daily for 3 days, then increase to one 0.5 mg tablet twice daily for 4 days, then increase to one 1 mg tablet twice daily. 03/02/15   Herminio Commons, MD  VITAMIN E PO Take 1 capsule by mouth daily.    [provider]    Family History  Family History  Problem Relation Age of Onset  . Colon polyps Mother   . Deep vein thrombosis Mother   . Heart disease Mother   . Hypertension Mother   . Bleeding Disorder Mother   . Hypertension Sister   . Heart disease Brother        before age 41  . Hypertension Brother  Social History Social History   Tobacco Use  . Smoking status: Current Every Day Smoker    Packs/day: 0.50    Years: 40.00    Pack years: 20.00    Types: Cigarettes    Start date: 09/06/1972  . Smokeless tobacco: Never Used  Substance Use Topics  . Alcohol use: No    Alcohol/week: 0.0 standard drinks  . Drug use: No     Allergies   Acetaminophen-codeine, Aspirin, Celecoxib, Levaquin [levofloxacin in d5w], Lipitor [atorvastatin], Sulfa antibiotics, Adhesive [tape], and Latex   Review of Systems Review of Systems  Constitutional: Positive for appetite change and fatigue. Negative for fever.  HENT: Negative for congestion.   Respiratory: Positive for shortness of breath.   Cardiovascular: Positive for leg swelling. Negative for chest pain.  Gastrointestinal: Positive for abdominal pain.  Genitourinary: Negative for flank pain and hematuria.  Musculoskeletal: Negative for back pain.  Skin: Negative for wound.  Neurological: Positive for weakness.  Psychiatric/Behavioral: Positive for confusion.     Physical Exam Updated Vital Signs BP (!) 117/97 (BP Location: Right Arm)   Pulse 87   Temp 98 F (36.7 C) (Oral)   Resp (!) 24   SpO2 (!) 89%   Physical Exam Vitals signs and nursing note reviewed.  HENT:     Head: Atraumatic.  Cardiovascular:     Rate and Rhythm: Normal rate and regular rhythm.  Pulmonary:     Comments: Diffuse wheezes and prolonged expirations.  Somewhat decreased breath sounds throughout. Abdominal:     Tenderness: There is no abdominal tenderness.  Musculoskeletal:     Right lower leg: Edema present.     Left lower leg: Edema present.     Comments: Moderate  bilateral lower extremity pitting edema.Upper extremity edema also but less severe.  Skin:    Capillary Refill: Capillary refill takes less than 2 seconds.     Coloration: Skin is pale.  Neurological:     Mental Status: She is alert.     Comments: Patient is awake answers most questions for me but for the nurse had more confusion.      ED Treatments / Results  Labs (all labs ordered are listed, but only abnormal results are displayed) Labs Reviewed  COMPREHENSIVE METABOLIC PANEL - Abnormal; Notable for the following components:      Result Value   Potassium 2.6 (*)    Chloride 96 (*)    CO2 33 (*)    Glucose, Bld 144 (*)    BUN 51 (*)    Creatinine, Ser 2.47 (*)    Calcium 8.4 (*)    Total Protein 6.4 (*)    Albumin 3.2 (*)    AST 263 (*)    ALT 118 (*)    Alkaline Phosphatase 155 (*)    Total Bilirubin 1.6 (*)    GFR calc non Af Amer 20 (*)    GFR calc Af Amer 24 (*)    All other components within normal limits  CBC WITH DIFFERENTIAL/PLATELET - Abnormal; Notable for the following components:   WBC 12.4 (*)    RBC 3.75 (*)    Hemoglobin 7.9 (*)    HCT 30.1 (*)    MCH 21.1 (*)    MCHC 26.2 (*)    RDW 23.3 (*)    Neutro Abs 10.0 (*)    Abs Immature Granulocytes 0.16 (*)    All other components within normal limits  BRAIN NATRIURETIC PEPTIDE - Abnormal; Notable for the following components:  B Natriuretic Peptide 1,260.0 (*)    All other components within normal limits  POC OCCULT BLOOD, ED - Abnormal; Notable for the following components:   Fecal Occult Bld POSITIVE (*)    All other components within normal limits  SARS CORONAVIRUS 2 (HOSPITAL ORDER, PERFORMED IN [facility])  ETHANOL  RAPID URINE DRUG SCREEN, HOSP PERFORMED  URINALYSIS, ROUTINE W REFLEX MICROSCOPIC  TSH    EKG EKG Interpretation  Date/Time:  Monday August 24 2018 13:48:07 EDT Ventricular Rate:  83 PR Interval:    QRS Duration: 112 QT Interval:  383 QTC Calculation: 450 R  Axis:   114 Text Interpretation:  Sinus rhythm Probable lateral infarct, age indeterminate Confirmed by Davonna Belling [phone]) on 08/24/2018 2:10:11 PM   Radiology Dg Chest Portable 1 View  Result Date: 08/24/2018 CLINICAL DATA:  Shortness of breath. Generalized weakness for the past 2 weeks. Smoker. EXAM: PORTABLE CHEST 1 VIEW COMPARISON:  05/16/2016. FINDINGS: Interval enlarged cardiac silhouette and moderate-sized right pleural effusion with right basilar airspace opacity. Mild diffuse peribronchial thickening with mild progression. Stable old, healed left 6th rib fracture. IMPRESSION: 1. Interval cardiomegaly and moderate-sized right pleural effusion with right basilar atelectasis or pneumonia. 2. Mild bronchitic changes with mild progression. Electronically Signed   By: Claudie Revering M.D.   On: 08/24/2018 14:28    Procedures Procedures (including critical care time)  Medications Ordered in ED Medications  albuterol (VENTOLIN HFA) 108 (90 Base) MCG/ACT inhaler 3 puff (has no administration in time range)  cefTRIAXone (ROCEPHIN) 1 g in sodium chloride 0.9 % 100 mL IVPB (has no administration in time range)  azithromycin (ZITHROMAX) 500 mg in sodium chloride 0.9 % 250 mL IVPB (has no administration in time range)     Initial Impression / Assessment and Plan / ED Course  I have reviewed the triage vital signs and the nursing notes.  Pertinent labs & imaging results that were available during my care of the patient were reviewed by me and considered in my medical decision making (see chart for details).        Patient presents with generalized weakness worse in the last 2 weeks.  Hypoxic on room air but does better on nasal cannula oxygen.  Patient is pale.  Anemia with guaiac positive stool although not frank red blood.  X-ray shows pleural effusion and possible pneumonia.  Will treat.  More labs pending but will require admission to the hospital.  Care will be turned over to Dr.  Lacinda Axon.  CRITICAL CARE Performed by: Davonna Belling Total critical care time: 30 minutes Critical care time was exclusive of separately billable procedures and treating other patients. Critical care was necessary to treat or prevent imminent or life-threatening deterioration. Critical care was time spent personally by me on the following activities: development of treatment plan with patient and/or surrogate as well as nursing, discussions with consultants, evaluation of patient's response to treatment, examination of patient, obtaining history from patient or surrogate, ordering and performing treatments and interventions, ordering and review of laboratory studies, ordering and review of radiographic studies, pulse oximetry and re-evaluation of patient's condition.   Final Clinical Impressions(s) / ED Diagnoses   Final diagnoses:  Community acquired pneumonia of right lung, unspecified part of lung  Pleural effusion  Anemia, unspecified type  Depression, unspecified depression type  Hypoxia  Gastrointestinal hemorrhage, unspecified gastrointestinal hemorrhage type  Hypokalemia  AKI (acute kidney injury) [facility])    ED Discharge Orders    None  Davonna Belling, MD 08/24/18 [phone]

## 2018-08-24 NOTE — ED Notes (Signed)
CRITICAL VALUE ALERT  Critical Value:  Potassium 2.6  Date & Time Notied:  6153  08/24/18  Provider Notified: Alvino Chapel  Orders Received/Actions taken:

## 2018-08-24 NOTE — ED Notes (Signed)
Spoke with pt's husband who states pt has not worn home oxygen in over 4 years. States she normally has no issues and only uses oxygen in the hospital.

## 2018-08-25 ENCOUNTER — Inpatient Hospital Stay (HOSPITAL_COMMUNITY): Payer: Medicare Other

## 2018-08-25 ENCOUNTER — Encounter (HOSPITAL_COMMUNITY): Payer: Self-pay | Admitting: Gastroenterology

## 2018-08-25 DIAGNOSIS — I361 Nonrheumatic tricuspid (valve) insufficiency: Secondary | ICD-10-CM

## 2018-08-25 DIAGNOSIS — R7989 Other specified abnormal findings of blood chemistry: Secondary | ICD-10-CM

## 2018-08-25 LAB — BASIC METABOLIC PANEL
Anion gap: 13 (ref 5–15)
BUN: 45 mg/dL — ABNORMAL HIGH (ref 8–23)
CO2: 32 mmol/L (ref 22–32)
Calcium: 8.6 mg/dL — ABNORMAL LOW (ref 8.9–10.3)
Chloride: 98 mmol/L (ref 98–111)
Creatinine, Ser: 2.08 mg/dL — ABNORMAL HIGH (ref 0.44–1.00)
GFR calc Af Amer: 29 mL/min — ABNORMAL LOW (ref >60)
GFR calc non Af Amer: 25 mL/min — ABNORMAL LOW (ref >60)
Glucose, Bld: 227 mg/dL — ABNORMAL HIGH (ref 70–99)
Potassium: 3.8 mmol/L (ref 3.5–5.1)
Sodium: 143 mmol/L (ref 135–145)

## 2018-08-25 LAB — CBC
HCT: 26.7 % — ABNORMAL LOW (ref 36.0–46.0)
Hemoglobin: 7 g/dL — ABNORMAL LOW (ref 12.0–15.0)
MCH: 21.3 pg — ABNORMAL LOW (ref 26.0–34.0)
MCHC: 26.2 g/dL — ABNORMAL LOW (ref 30.0–36.0)
MCV: 81.4 fL (ref 80.0–100.0)
Platelets: 285 10*3/uL (ref 150–400)
RBC: 3.28 MIL/uL — ABNORMAL LOW (ref 3.87–5.11)
RDW: 23.4 % — ABNORMAL HIGH (ref 11.5–15.5)
WBC: 7.7 10*3/uL (ref 4.0–10.5)
nRBC: 0.5 % — ABNORMAL HIGH (ref 0.0–0.2)

## 2018-08-25 LAB — PREPARE RBC (CROSSMATCH)

## 2018-08-25 LAB — ECHOCARDIOGRAM COMPLETE: Weight: 2183.44 oz

## 2018-08-25 LAB — HIV ANTIBODY (ROUTINE TESTING W REFLEX): HIV Screen 4th Generation wRfx: NONREACTIVE

## 2018-08-25 LAB — MAGNESIUM: Magnesium: 1.6 mg/dL — ABNORMAL LOW (ref 1.7–2.4)

## 2018-08-25 MED ORDER — ALBUTEROL SULFATE (2.5 MG/3ML) 0.083% IN NEBU: 3.0000 mL | INHALATION_SOLUTION | Freq: Four times a day (QID) | RESPIRATORY_TRACT | Status: DC | PRN

## 2018-08-25 MED ORDER — SODIUM CHLORIDE 0.9 % IV SOLN
INTRAVENOUS | Status: DC | PRN
  Administered 2018-08-25: 12:00:00 250 mL via INTRAVENOUS

## 2018-08-25 MED ORDER — PANTOPRAZOLE SODIUM 40 MG PO TBEC
40.0000 mg | DELAYED_RELEASE_TABLET | Freq: Two times a day (BID) | ORAL | Status: DC
  Administered 2018-08-25 – 2018-08-27 (×5): 40 mg via ORAL
  Filled 2018-08-25 (×6): qty 1

## 2018-08-25 MED ORDER — HYDROCODONE-ACETAMINOPHEN 7.5-325 MG PO TABS
1.0000 | ORAL_TABLET | Freq: Four times a day (QID) | ORAL | Status: DC | PRN
  Administered 2018-08-25 – 2018-08-27 (×3): 1 via ORAL
  Filled 2018-08-25 (×3): qty 1

## 2018-08-25 MED ORDER — FUROSEMIDE 10 MG/ML IJ SOLN
40.0000 mg | Freq: Two times a day (BID) | INTRAMUSCULAR | Status: DC
  Administered 2018-08-26 – 2018-08-27 (×3): 40 mg via INTRAVENOUS
  Filled 2018-08-25 (×3): qty 4

## 2018-08-25 MED ORDER — LEVOTHYROXINE SODIUM 25 MCG PO TABS
25.0000 ug | ORAL_TABLET | Freq: Every morning | ORAL | Status: DC
  Administered 2018-08-26 – 2018-08-27 (×2): 25 ug via ORAL
  Filled 2018-08-25 (×2): qty 1

## 2018-08-25 MED ORDER — ALPRAZOLAM 1 MG PO TABS
1.0000 mg | ORAL_TABLET | Freq: Three times a day (TID) | ORAL | Status: DC | PRN
  Administered 2018-08-25 – 2018-08-27 (×4): 1 mg via ORAL
  Filled 2018-08-25 (×4): qty 1

## 2018-08-25 MED ORDER — SODIUM CHLORIDE 0.9 % IV SOLN
510.0000 mg | Freq: Once | INTRAVENOUS | Status: AC
  Administered 2018-08-25: 510 mg via INTRAVENOUS
  Filled 2018-08-25: qty 510

## 2018-08-25 MED ORDER — FLUTICASONE PROPIONATE 50 MCG/ACT NA SUSP
2.0000 | Freq: Every day | NASAL | Status: DC
  Administered 2018-08-25 – 2018-08-27 (×3): 2 via NASAL
  Filled 2018-08-25: qty 16

## 2018-08-25 MED ORDER — METHYLPREDNISOLONE SODIUM SUCC 40 MG IJ SOLR
40.0000 mg | Freq: Two times a day (BID) | INTRAMUSCULAR | Status: DC
  Administered 2018-08-26 – 2018-08-27 (×3): 40 mg via INTRAVENOUS
  Filled 2018-08-25 (×3): qty 1

## 2018-08-25 MED ORDER — DULOXETINE HCL 60 MG PO CPEP
60.0000 mg | ORAL_CAPSULE | Freq: Two times a day (BID) | ORAL | Status: DC
  Administered 2018-08-25 – 2018-08-27 (×5): 60 mg via ORAL
  Filled 2018-08-25 (×5): qty 1

## 2018-08-25 MED ORDER — TIOTROPIUM BROMIDE MONOHYDRATE 18 MCG IN CAPS: 18.0000 ug | ORAL_CAPSULE | Freq: Every day | RESPIRATORY_TRACT | Status: DC

## 2018-08-25 MED ORDER — MAGNESIUM SULFATE 2 GM/50ML IV SOLN
2.0000 g | Freq: Once | INTRAVENOUS | Status: AC
  Administered 2018-08-25: 20:00:00 2 g via INTRAVENOUS
  Filled 2018-08-25: qty 50

## 2018-08-25 MED ORDER — ENSURE ENLIVE PO LIQD: 237.0000 mL | Freq: Two times a day (BID) | ORAL | Status: DC

## 2018-08-25 MED ORDER — TOPIRAMATE 25 MG PO TABS
50.0000 mg | ORAL_TABLET | Freq: Two times a day (BID) | ORAL | Status: DC
  Administered 2018-08-25 – 2018-08-27 (×5): 50 mg via ORAL
  Filled 2018-08-25 (×5): qty 2

## 2018-08-25 MED ORDER — UMECLIDINIUM BROMIDE 62.5 MCG/INH IN AEPB
1.0000 | INHALATION_SPRAY | Freq: Every day | RESPIRATORY_TRACT | Status: DC
  Administered 2018-08-26 – 2018-08-27 (×2): 1 via RESPIRATORY_TRACT
  Filled 2018-08-25: qty 7

## 2018-08-25 MED ORDER — SODIUM CHLORIDE 0.9% IV SOLUTION: Freq: Once | INTRAVENOUS | Status: DC

## 2018-08-25 NOTE — Progress Notes (Signed)
PROGRESS NOTE    [name]  PPJ:[id number] DOB: [birth year] DOA: 08/24/2018 PCP: Jani Gravel, MD   Brief Narrative:  Per HPI: [name] is a 63 y.o. female with medical history significant for CHF, hypothyroidism, peripheral vascular disease, COPD, depression anxiety, hypertension, subclavian steal syndrome, CVA. At the time of my evaluation patient is alert and oriented, able to give me a history.  Who presented to the ED with complaints of generalized weakness of 2 weeks, and confusion at night.  Patient reports over the past 2 weeks she has barely eaten or drank anything that she has felt so depressed.  No abdominal pain, no vomiting, she reports some occasional loose stools.  She reports chronic difficulty breathing, but is unable to tell me if this has changed recently.  She denies cough.  No chest pain.  No fever or chills.  She reports bilateral lower extremity swelling of about 2 weeks duration now.  She reports her normal weight is about  115 pounds.  She denies being on any fluid pills.  She denies vomiting of blood, denies black stools or bloody stools.  She denies NSAID use.  She is unaware of a history of heart disease or stroke, though both of these are listed on her medical problems. Patient is supposed to be on CPAP at night and home oxygen, both of which she has not used in at least 4 years.  Per patient spouse-he has been seeing blood in patient's underwear, and patient has been admitted to [facility] for same.  Patient was admitted with acute hypoxemic respiratory failure likely secondary to some pulmonary edema with effusions as well as pneumonia.  She additionally was noted to have acute kidney injury and acute blood loss anemia secondary to presumed GI bleed.  She apparently has had recent colonoscopy at [facility].  GI is awaiting further records and is considering EGD.  Assessment & Plan:   Principal Problem:   Acute respiratory failure with hypoxia  (HCC) Active Problems:   Anxiety and depression   HTN (hypertension)   COPD exacerbation (HCC)   Hypokalemia   Elevated LFTs   Acute respiratory failure with hypoxia-with reported dyspnea, with hypoxia O2 sats 68% on room air improved with nasal cannula.  Now on 2 L.  With mild leukocytosis 12.4.  Etiology likely combination of effusion and pneumonia, with COPD exacerbation.  Two-view chest x-ray shows moderate-sized right pleural effusion with right basilar atelectasis or pneumonia. Considering severity of hypoxia will treat for pneumonia. -Continue empiric CAP coverage with IV ceftriaxone and azithromycin started in ED -CBC a.m.  CHF history- decompensated.  New 1+ pitting pedal edema bilaterally, marked elevated BNP 1260 ( was 67  four years ago) chest x-ray with moderate sized right pleural effusion, no echo on file.  Not on diuretics.  She is unaware if she has gained weight.  Denies chest pain.  EKG unchanged.  Elevated creatinine, 2.47->2.08 -IV Lasix 40 mg q12h,   -Strict input output, daily weights -BMP a.m. -Echocardiogram with LVEF 50-55%  Acute kidney injury-creatinine 2.47 now to 2.08.  Last check 4 years ago baseline 0.6 - 0.7.  Appears to be cardiorenal-with 1+ pitting pedal edema, marked elevated BNP.  But patient reports barely any p.o. intake in the past 2 weeks- so I concerned this might be prerenal instead. -IV Lasix from 20 mg twice daily to 40 mg twice daily due to poor diuresis thus far. -BMP a.m.  Hypokalemia-potassium 2.6, with normal magnesium 2.  Likely from poor p.o. intake over the past 2 weeks, ?? the occasional diarrhea. -Resolving and will recheck in a.m.  Generalized weakness-likely multifactorial from hypokalemia, depression, poor p.o. intake, pneumonia, and hypoxia, possibly anemia contributing. -Plan per above  Metabolic encephalopathy-likely from hypoxia and probably pneumonia.  Mental status improved with oxygen by nasal cannula.  Acute  anemia-hemoglobin 7.9.  Last check 4 years ago baseline 11-12.  Stool occult positive.  She denies black stools or blood loss, or abdominal pain..  Denies NSAID use.  No Colonoscopy or EGD on file.  Per patient spouse-he has been seeing blood in patient's underwear, and patient has been admitted to [facility] for same. -Transfused 2 units PRBCs due to hemoglobin 7.0 this a.m. -Iron deficiency noted with Feraheme transfusion ordered - CBC a.m -GI following with possible EGD soon and records being obtained from [facility]  COPD exacerbation-breakthrough wheezing on exam. Patient hypoxic.  She used to be on CPAP and home O2 but has not used in at least 4 years.  Bicarb 33, chronically elevated likely reflecting chronic respiratory failure.  Still smokes, about 2 cigarettes/day. -Duo nebs PRN, scheduled -IV Solu-Medrol 60 twice daily to 40 mg twice daily -Antibiotics  Anxiety/depression-reports depressed feelings over the past 2 weeks, which resulted in poor p.o. intake.  Denies suicidal or homicidal ideations.  She is unable to tell me what medications she takes -Consult TTS prior to discharge once medically stable -Home medications reconciled   DVT prophylaxis: SCDs Code Status: Full Family Communication: None at bedside Disposition Plan: Continue treatment for pneumonia, CHF and COPD.  Appreciate GI evaluation with likely need for endoscopy once medically stable.   Consultants:   GI  Procedures:   None  Antimicrobials:  Anti-infectives (From admission, onward)   Start     Dose/Rate Route Frequency Ordered Stop   08/25/18 1400  cefTRIAXone (ROCEPHIN) 1 g in sodium chloride 0.9 % 100 mL IVPB  Status:  Discontinued     1 g 200 mL/hr over 30 Minutes Intravenous Every 24 hours 08/24/18 1717 08/24/18 1725   08/25/18 1400  azithromycin (ZITHROMAX) 500 mg in sodium chloride 0.9 % 250 mL IVPB     500 mg 250 mL/hr over 60 Minutes Intravenous Every 24 hours 08/24/18 1717      08/25/18 1200  cefTRIAXone (ROCEPHIN) 2 g in sodium chloride 0.9 % 100 mL IVPB     2 g 200 mL/hr over 30 Minutes Intravenous Every 24 hours 08/24/18 1725     08/24/18 1445  cefTRIAXone (ROCEPHIN) 1 g in sodium chloride 0.9 % 100 mL IVPB     1 g 200 mL/hr over 30 Minutes Intravenous  Once 08/24/18 1434 08/24/18 1534   08/24/18 1445  azithromycin (ZITHROMAX) 500 mg in sodium chloride 0.9 % 250 mL IVPB     500 mg 250 mL/hr over 60 Minutes Intravenous  Once 08/24/18 1434 08/24/18 1636       Subjective: Patient seen and evaluated today with no new acute complaints or concerns. No acute concerns or events noted overnight.  She continues to remain on oxygen and is currently having her 2D echocardiogram performed.  Objective: Vitals:   08/25/18 0833 08/25/18 1427 08/25/18 1429 08/25/18 1448  BP:  (!) 146/70  (!) 141/87  Pulse:  75  77  Resp:  20  20  Temp:  98.6 F (37 C)  97.8 F (36.6 C)  TempSrc:  Oral  Oral  SpO2: 94% 95% 95% 98%  Weight:  Intake/Output Summary (Last 24 hours) at 08/25/2018 1748 Last data filed at 08/25/2018 1605 Gross per 24 hour  Intake 746.36 ml  Output 301 ml  Net 445.36 ml   Filed Weights   08/25/18 0538  Weight: 61.9 kg    Examination:  General exam: Appears calm and comfortable  Respiratory system: Clear to auscultation. Respiratory effort normal.  Currently on nasal cannula oxygen. Cardiovascular system: S1 & S2 heard, RRR. No JVD, murmurs, rubs, gallops or clicks. No pedal edema. Gastrointestinal system: Abdomen is nondistended, soft and nontender. No organomegaly or masses felt. Normal bowel sounds heard. Central nervous system: Alert and oriented. No focal neurological deficits. Extremities: Symmetric 5 x 5 power. Skin: No rashes, lesions or ulcers Psychiatry: Judgement and insight appear normal. Mood & affect appropriate.     Data Reviewed: I have personally reviewed following labs and imaging studies  CBC: Recent Labs  Lab  08/24/18 1408 08/25/18 0433  WBC 12.4* 7.7  NEUTROABS 10.0*  --   HGB 7.9* 7.0*  HCT 30.1* 26.7*  MCV 80.3 81.4  PLT 363 440   Basic Metabolic Panel: Recent Labs  Lab 08/24/18 1408 08/24/18 1529 08/25/18 1331  NA 141  --  143  K 2.6*  --  3.8  CL 96*  --  98  CO2 33*  --  32  GLUCOSE 144*  --  227*  BUN 51*  --  45*  CREATININE 2.47*  --  2.08*  CALCIUM 8.4*  --  8.6*  MG  --  2.0 1.6*   GFR: CrCl cannot be calculated (Unknown ideal weight.). Liver Function Tests: Recent Labs  Lab 08/24/18 1408  AST 263*  ALT 118*  ALKPHOS 155*  BILITOT 1.6*  PROT 6.4*  ALBUMIN 3.2*   No results for input(s): LIPASE, AMYLASE in the last 168 hours. Recent Labs  Lab 08/24/18 1529  AMMONIA 19   Coagulation Profile: Recent Labs  Lab 08/24/18 1529  INR 1.2   Cardiac Enzymes: No results for input(s): CKTOTAL, CKMB, CKMBINDEX, TROPONINI in the last 168 hours. BNP (last 3 results) No results for input(s): PROBNP in the last 8760 hours. HbA1C: No results for input(s): HGBA1C in the last 72 hours. CBG: No results for input(s): GLUCAP in the last 168 hours. Lipid Profile: No results for input(s): CHOL, HDL, LDLCALC, TRIG, CHOLHDL, LDLDIRECT in the last 72 hours. Thyroid Function Tests: Recent Labs    08/24/18 1408  TSH 3.669   Anemia Panel: Recent Labs    08/24/18 1602  FERRITIN 11  TIBC 479*  IRON 9*   Sepsis Labs: No results for input(s): PROCALCITON, LATICACIDVEN in the last 168 hours.  Recent Results (from the past 240 hour(s))  SARS Coronavirus 2 (CEPHEID - Performed in [place] hospital lab), Hosp Order     Status: None   Collection Time: 08/24/18  2:09 PM   Specimen: Nasopharyngeal Swab  Result Value Ref Range Status   SARS Coronavirus 2 NEGATIVE NEGATIVE Final    Comment: (NOTE) If result is NEGATIVE SARS-CoV-2 target nucleic acids are NOT DETECTED. The SARS-CoV-2 RNA is generally detectable in upper and lower  respiratory specimens during the  acute phase of infection. The lowest  concentration of SARS-CoV-2 viral copies this assay can detect is 250  copies / mL. A negative result does not preclude SARS-CoV-2 infection  and should not be used as the sole basis for treatment or other  patient management decisions.  A negative result may occur with  improper specimen collection /  handling, submission of specimen other  than nasopharyngeal swab, presence of viral mutation(s) within the  areas targeted by this assay, and inadequate number of viral copies  (<250 copies / mL). A negative result must be combined with clinical  observations, patient history, and epidemiological information. If result is POSITIVE SARS-CoV-2 target nucleic acids are DETECTED. The SARS-CoV-2 RNA is generally detectable in upper and lower  respiratory specimens dur ing the acute phase of infection.  Positive  results are indicative of active infection with SARS-CoV-2.  Clinical  correlation with patient history and other diagnostic information is  necessary to determine patient infection status.  Positive results do  not rule out bacterial infection or co-infection with other viruses. If result is PRESUMPTIVE POSTIVE SARS-CoV-2 nucleic acids MAY BE PRESENT.   A presumptive positive result was obtained on the submitted specimen  and confirmed on repeat testing.  While 2019 novel coronavirus  (SARS-CoV-2) nucleic acids may be present in the submitted sample  additional confirmatory testing may be necessary for epidemiological  and / or clinical management purposes  to differentiate between  SARS-CoV-2 and other Sarbecovirus currently known to infect humans.  If clinically indicated additional testing with an alternate test  methodology [phone]) is advised. The SARS-CoV-2 RNA is generally  detectable in upper and lower respiratory sp ecimens during the acute  phase of infection. The expected result is Negative. Fact Sheet for Patients:   StrictlyIdeas.no Fact Sheet for Healthcare Providers: BankingDealers.co.za This test is not yet approved or cleared by the [place] FDA and has been authorized for detection and/or diagnosis of SARS-CoV-2 by FDA under an Emergency Use Authorization (EUA).  This EUA will remain in effect (meaning this test can be used) for the duration of the COVID-19 declaration under Section 564(b)(1) of the Act, 21 U.S.C. section 360bbb-3(b)(1), unless the authorization is terminated or revoked sooner. Performed at [facility], [address] [place], [place] [address]          Radiology Studies: US Abdomen Complete  Result Date: 08/25/2018 CLINICAL DATA:  63 year old presenting with elevated liver function tests and hyperbilirubinemia. Surgical history includes cholecystectomy. EXAM: ABDOMEN ULTRASOUND COMPLETE COMPARISON:  CT abdomen pelvis 06/11/2012. FINDINGS: Gallbladder: Surgically absent. Common bile duct: Diameter: Approximately 7 mm. No obstructing bile duct stones or mass. Unchanged since the prior CT. Liver: Diffusely increased and coarsened echotexture without focal hepatic parenchymal abnormality. Portal vein is patent on color Doppler imaging with normal direction of blood flow towards the liver. IVC: Patent. Pancreas: While difficult to visualize in its entirety, visualized portions normal in appearance. Spleen: Normal size and echotexture without focal parenchymal abnormality. Right Kidney: Length: Approximately 11.7 cm. No hydronephrosis. Well-preserved cortex. No shadowing calculi. Normal parenchymal echotexture. No focal parenchymal abnormality. Left Kidney: Length: Approximately 9.9 cm. No hydronephrosis. Well-preserved cortex. No shadowing calculi. Normal parenchymal echotexture. No focal parenchymal abnormality. Abdominal aorta: Normal in caliber throughout its visualized course in the abdomen with evidence of atherosclerosis. Maximum  diameter 2.9 cm approximately. Other findings: None. IMPRESSION: 1. Diffuse hepatic steatosis and/or hepatocellular disease without focal hepatic parenchymal abnormality. 2. No unexpected biliary ductal dilation post cholecystectomy. The common bile duct measures 7 mm in diameter and is unchanged from a prior CT in 2014. 3.  Aortic Atherosclerosis (ICD10-170.0) Electronically Signed   By: Evangeline Dakin M.D.   On: 08/25/2018 13:51   Dg Chest Portable 1 View  Result Date: 08/24/2018 CLINICAL DATA:  Shortness of breath. Generalized weakness for the past 2 weeks. Smoker. EXAM: PORTABLE  CHEST 1 VIEW COMPARISON:  05/16/2016. FINDINGS: Interval enlarged cardiac silhouette and moderate-sized right pleural effusion with right basilar airspace opacity. Mild diffuse peribronchial thickening with mild progression. Stable old, healed left 6th rib fracture. IMPRESSION: 1. Interval cardiomegaly and moderate-sized right pleural effusion with right basilar atelectasis or pneumonia. 2. Mild bronchitic changes with mild progression. Electronically Signed   By: Claudie Revering M.D.   On: 08/24/2018 14:28        Scheduled Meds: . sodium chloride   Intravenous Once  . DULoxetine  60 mg Oral BID  . feeding supplement (ENSURE ENLIVE)  237 mL Oral BID BM  . fluticasone  2 spray Each Nare Daily  . furosemide  20 mg Intravenous Q12H  . ipratropium-albuterol  3 mL Nebulization Q6H  . [START ON 08/26/2018] levothyroxine  25 mcg Oral q morning - 10a  . methylPREDNISolone (SOLU-MEDROL) injection  60 mg Intravenous Q12H  . pantoprazole  40 mg Oral BID AC  . topiramate  50 mg Oral BID  . umeclidinium bromide  1 puff Inhalation Daily   Continuous Infusions: . sodium chloride 250 mL (08/25/18 1200)  . azithromycin    . cefTRIAXone (ROCEPHIN)  IV 2 g (08/25/18 1250)  . magnesium sulfate bolus IVPB       LOS: 1 day    Time spent: 30 minutes    Kaiyah Eber Darleen Crocker, DO [facility] Pager [phone]  If 7PM-7AM,  please contact night-coverage [email or web address] Password [facility] 08/25/2018, 5:49 PM

## 2018-08-25 NOTE — Progress Notes (Signed)
Spoke with nursing staff regarding obtaining patient records from [place]. They requested I put in a note, and they will have the patient sign a release form to fax to [facility]. Please obtain colonoscopy report from [place].   Aliene Altes, PA-C [facility]

## 2018-08-25 NOTE — Progress Notes (Signed)
Initial Nutrition Assessment  DOCUMENTATION CODES:  Not applicable  INTERVENTION:  While restricted to CL diet, Boost Breeze po TID, each supplement provides 250 kcal and 9 grams of protein  Once diet advance to FL, Ensure Enlive po BID, each supplement provides 350 kcal and 20 grams of protein  RD spoke with dietary ambassador regarding meal orders and request for additional tea.   Pt reports salt shaker use- RD gave brief counseling against this and over consumption of fluids   NUTRITION DIAGNOSIS:  Inadequate oral intake related to poor appetite as evidenced by per patient report.  GOAL:  Patient will meet greater than or equal to 90% of their needs  MONITOR:  PO intake, Weight trends, Supplement acceptance, Diet advancement, Labs, I & O's  REASON FOR ASSESSMENT:  Malnutrition Screening Tool    ASSESSMENT:  63 y/o female PMHx CHF, PVD, COPD, depression, anxiety, HTN, subclavian steal syndrome, CVA. Presents with report of generalized weakness x2 weeks, secondary to poor intake due to by depression. Additionally spouse reports blood in underwear. In ED,  Pt found to have O2 of 68% and BNP of 1300 w/ cxr showing moderate R pleural effusion and possible PNA. Admitted for acute resp failure 2/2 AoC HF, COPD and pna.   Pt displays mild confusion and is a poor historian, but is largely able to converse.   Pt says she knew a dietitian would come see her about her reported intake/wt. She says her poor intake is not an acute issue, but a chronic one. She says she has never eaten much because she doesn't get hungry. She states "when I was a child they thought I had an eating disorder". She says she has worked with Technical [employer] in past and has been instructed to eat 6 small meals a day.   Regarding her baseline nutrition, she says her intake varies day to day. She will eat when she is hungry and if it is something she wants. If the food doesn't meet these criteria, she doesn't eat. She  does report frequent fluid intake and is always drinking during day. RD brought over consumption of fluid- she responds "they said that wasn't the issue because they know I just sip". She says gets 1 20 oz mt dew, a bottle of water and tea in each day. She uses the salt shaker frequently- "put me in the room with a salt shaker and let me be". RD reviewed ramifications of salt shaker use. She says she knows and she will "keep an eye on things".   Regarding her weight, she gives UBW of 115 lbs? She was admitted at 136.5 lbs. She says this is d/t swelling/fluid. Reviewed wt history in chart. There is one recent wt measurement from an OP Neurosurg appt w/ [facility] this past February- she is listed as being 137.4 lbs at that time. All other weights are much older- she was stable at 125-135 from 2012-2016. Question accuracy of reported UBW.   On RD arrival, 75% of pts tray was gone. She says she did not eat the missing food, rather it fell on her lap. She says she just "skimmed" her plate because she didn't like the items. She asks if she can make request for dinner. She was agreeable to oral supplements. She is currently on a CL diet per GI. Will try Boost Breeze. She requests extra tea. RD spoke w/ dietary about her concerns  Labs: K: 2.6, Albumin: 3.2, Elevated LFTs, BUN/Creat:51/2.47 Meds: Lasix, Methylprednisolone, PPI, IVF, IV  abx  Recent Labs  Lab 08/24/18 1408 08/24/18 1529  NA 141  --   K 2.6*  --   CL 96*  --   CO2 33*  --   BUN 51*  --   CREATININE 2.47*  --   CALCIUM 8.4*  --   MG  --  2.0  GLUCOSE 144*  --    NUTRITION - FOCUSED PHYSICAL EXAM:   Most Recent Value  Orbital Region  Mild depletion  Upper Arm Region  No depletion  Thoracic and Lumbar Region  No depletion  Buccal Region  Mild depletion  Temple Region  No depletion  Clavicle Bone Region  No depletion  Clavicle and Acromion Bone Region  No depletion  Scapular Bone Region  No depletion  Dorsal Hand  No depletion   Patellar Region  No depletion  Anterior Thigh Region  No depletion  Posterior Calf Region  No depletion  Edema (RD Assessment)  Mild  Hair  Reviewed  Eyes  Reviewed  Skin  Reviewed  Nails  Reviewed     Diet Order:   Diet Order            Diet clear liquid Room service appropriate? Yes; Fluid consistency: Thin  Diet effective now             EDUCATION NEEDS:  Education needs have been addressed  Skin:  Skin Assessment: Reviewed RN Assessment  Last BM:  7/20  Height:  Ht Readings from Last 1 Encounters:  10/18/14 5\' 2"  (1.575 m)   Weight:  Wt Readings from Last 1 Encounters:  08/25/18 61.9 kg   Ideal Body Weight:  50 kg  BMI:  Body mass index is 24.96 kg/m.  Estimated Nutritional Needs:  Kcal:  1550-1750 (25-28 kcal/kg bw) Protein:  80-95g Pro (1.3-1.5g/kg bw) Fluid:  1.6-1.8 L fluid (1 ml/kcal)  Burtis Junes RD, LDN, CNSC Clinical Nutrition Available Tues-Sat via Pager: [phone] 08/25/2018 1:30 PM

## 2018-08-25 NOTE — Consult Note (Signed)
Referring Provider: [facility] Primary Care Physician:  Jani Gravel, MD Primary Gastroenterologist:  Dr. Oneida Alar  Date of Admission: 08/24/18  Date of Consultation: 08/25/18  Reason for Consultation:  Anemia  HPI:  [name] is a 63 y.o. year old female with medical history significant for CHF, hypothyroidism, peripheral vascular disease, carotid artery stenosis, COPD, depression anxiety, hypertension, HLD. Patient presented to the ED with complaints of generalized weakness of 2 weeks, and confusion. Per RN note, patients spouse has been seeing blood in patient's underwear and patient has been admitted to [facility] for same.   In the ED, patient was lethargic and found to have O2 stats of 68%. After placing patient on nasal canula, initially 4L and titrated down to 2L, she became more alert. BNP elevated at 1260.  Mild leukocytosis 12.4.  Hemoglobin 7.9. Microcytic indices. Stool occult positive. Elevated creatinine 2.47, BUN 51. Low potassium 2.6. AST 263, ALT 118, alk phos 155, bilirubin 1.6. Portable chest x-ray shows right pleural effusion with possible pneumonia. Patient started on IV ceftriaxone and azithromycin.  Hospitalist admited for community-acquired pneumonia. GI consulted for anemia.   Today:  Patient is a very difficult historian and has limited insight to her medical history.   Patient reports bright red blood per rectum on and off for several years. Also with black stools on and off for years. Reports being seen at [facility] last year sometime for rectal bleeding. States they told her she had blood in her stool, but admitted her for dehydration and did nothing about the rectal bleeding. Reports colonoscopy in the past with "hemorrhoids removed", but doesn't know when or where this was done. No abdominal pain. BMs every couple of days. No constipation. Reports loose stools. States this is normal for her since her colonoscopy and hemorrhoid removal.  Reports she  was treated for possible diverticulitis in the past, but can't tell me when. Denies antibiotics in the last 6 months. No N/V. Admits to acid reflux. Not well controlled since stopping Zantac. States she was started on a new medicine for reflux last week, but doesn't know what it is. No dysphagia.     Patient reports last stool with bright red blood was today. Patient also said she had black stools today. Nurse reports no blood in stool and stool was brown.    States she was going through a severe depression in the last 1.5 months. Patient reports some fecal incontinence since her depression got worse. "States the stool will just come out." States she has eating disorder. "When I get hungry, I eat." Reports going weeks at a time without eating. States prior to this admission, she hadn't eaten much at all for 5 weeks. Denies any significant weight loss.    Patient reports she was treated for hepatitis C last year. Thinks she was treated by Dr. Noel Journey office. Reports no known cirrhosis.    No alcohol use since her 57s. History of drug use in her 26s. "Heroin and anything else I could get." Denies any drug use since her 55s.   Ibuprofen 200-400 mg for knee pain. Maybe every 2 weeks or less. No iron.   Past Medical History:  Diagnosis Date  . Anemia   . Anxiety and depression   . Bronchitis   . Cancer ([place])    uterus  . CHF (congestive heart failure) ([place])   . Chronic pain 11/22/2010  . COPD (chronic obstructive pulmonary disease) ([place])   . Gastroesophageal reflux disease 11/22/2010  .  Hyperlipidemia   . Hypertension   . Hypothyroid   . Migraine headache 11/22/2010  . Near syncope   . Peripheral vascular disease ([place])   . Pneumonia   . Tobacco abuse    1/2 pack per day    Past Surgical History:  Procedure Laterality Date  . ABDOMINAL HYSTERECTOMY     without oophorectomy for neoplastic disease  . BACK SURGERY    . CARDIAC CATHETERIZATION  07/2013   normal coronary arteries  . CARPAL  TUNNEL RELEASE     Left  . CHOLECYSTECTOMY  2007  . DILATION AND CURETTAGE OF UTERUS    . LAPAROTOMY    . LEFT HEART CATHETERIZATION WITH CORONARY ANGIOGRAM N/A 07/12/2013   Procedure: LEFT HEART CATHETERIZATION WITH CORONARY ANGIOGRAM;  Surgeon: Leonie Man, MD;  Location: [facility] CATH LAB;  Service: Cardiovascular;  Laterality: N/A;  . LUMBAR LAMINECTOMY  x3   Left; complicated by neurologic dysfunction  . NASAL SINUS SURGERY      Prior to Admission medications   Medication Sig Start Date End Date Taking? Authorizing Provider  albuterol (PROAIR HFA) 108 (90 BASE) MCG/ACT inhaler Inhale 2 puffs into the lungs every 6 (six) hours as needed for wheezing or shortness of breath.    [provider]  albuterol (PROVENTIL) (2.5 MG/3ML) 0.083% nebulizer solution Take 3 mLs (2.5 mg total) by nebulization every 6 (six) hours as needed for wheezing or shortness of breath. 03/22/14   Kathie Dike, MD  ALPRAZolam Duanne Moron) 1 MG tablet Take 1 tablet (1 mg total) by mouth 3 (three) times daily as needed for anxiety. 12/22/13   Ward, Delice Bison, DO  aspirin EC 81 MG tablet Take 81 mg by mouth at bedtime.    [provider]  azithromycin (ZITHROMAX) 500 MG tablet Take 1 tablet (500 mg total) by mouth daily. 03/22/14   Kathie Dike, MD  cefUROXime (CEFTIN) 500 MG tablet Take 1 tablet (500 mg total) by mouth 2 (two) times daily with a meal. 03/22/14   Kathie Dike, MD  Cholecalciferol (VITAMIN D PO) Take 1 tablet by mouth daily.    [provider]  DULoxetine (CYMBALTA) 60 MG capsule Take 1 capsule (60 mg total) by mouth 2 (two) times daily. 12/22/13   Ward, Delice Bison, DO  fluticasone (FLONASE) 50 MCG/ACT nasal spray Place 2 sprays into both nostrils daily.  11/26/13   [provider]  guaiFENesin (MUCINEX) 600 MG 12 hr tablet Take 1 tablet (600 mg total) by mouth 2 (two) times daily. 03/22/14   Kathie Dike, MD  HYDROcodone-acetaminophen (NORCO) 7.5-325 MG per tablet Take  1 tablet by mouth every 6 (six) hours as needed for moderate pain. 10/18/14   Evalee Jefferson, PA-C  levothyroxine (LEVOTHROID) 25 MCG tablet Take 25 mcg by mouth every morning.     [provider]  metroNIDAZOLE (FLAGYL) 500 MG tablet Take 1 tablet (500 mg total) by mouth every 8 (eight) hours. 03/22/14   Kathie Dike, MD  Potassium 99 MG TABS Take 99 mg by mouth daily.    [provider]  predniSONE (DELTASONE) 10 MG tablet 6, 5, 4, 3, 2 then 1 tablet by mouth daily for 6 days total. 10/18/14   Idol, Almyra Free, PA-C  saccharomyces boulardii (FLORASTOR) 250 MG capsule Take 1 capsule (250 mg total) by mouth 2 (two) times daily. 03/22/14   Kathie Dike, MD  tiotropium (SPIRIVA HANDIHALER) 18 MCG inhalation capsule Place 1 capsule (18 mcg total) into inhaler and inhale daily. 03/22/14  Kathie Dike, MD  topiramate (TOPAMAX) 25 MG tablet Take 50 mg by mouth 2 (two) times daily.  11/26/13   [provider]  varenicline (CHANTIX STARTING MONTH PAK) 0.5 MG X 11 & 1 MG X 42 tablet Take one 0.5 mg tablet by mouth once daily for 3 days, then increase to one 0.5 mg tablet twice daily for 4 days, then increase to one 1 mg tablet twice daily. 03/02/15   Herminio Commons, MD  VITAMIN E PO Take 1 capsule by mouth daily.    [provider]    Current Facility-Administered Medications  Medication Dose Route Frequency Provider Last Rate Last Dose  . 0.9 %  sodium chloride infusion (Manually program via Guardrails IV Fluids)   Intravenous Once Manuella Ghazi, Pratik D, DO      . acetaminophen (TYLENOL) tablet 650 mg  650 mg Oral Q6H PRN Emokpae, Ejiroghene E, MD       Or  . acetaminophen (TYLENOL) suppository 650 mg  650 mg Rectal Q6H PRN Emokpae, Ejiroghene E, MD      . azithromycin (ZITHROMAX) 500 mg in sodium chloride 0.9 % 250 mL IVPB  500 mg Intravenous Q24H Emokpae, Ejiroghene E, MD      . cefTRIAXone (ROCEPHIN) 2 g in sodium chloride 0.9 % 100 mL IVPB  2 g Intravenous Q24H Emokpae,  Ejiroghene E, MD      . enoxaparin (LOVENOX) injection 30 mg  30 mg Subcutaneous Q24H Emokpae, Ejiroghene E, MD   30 mg at 08/24/18 1814  . ferumoxytol (FERAHEME) 510 mg in sodium chloride 0.9 % 100 mL IVPB  510 mg Intravenous Once Manuella Ghazi, Pratik D, DO      . furosemide (LASIX) injection 20 mg  20 mg Intravenous Q12H Emokpae, Ejiroghene E, MD   20 mg at 08/25/18 2426  . ipratropium-albuterol (DUONEB) 0.5-2.5 (3) MG/3ML nebulizer solution 3 mL  3 mL Nebulization Q4H PRN Emokpae, Ejiroghene E, MD      . ipratropium-albuterol (DUONEB) 0.5-2.5 (3) MG/3ML nebulizer solution 3 mL  3 mL Nebulization Q6H Emokpae, Ejiroghene E, MD   3 mL at 08/25/18 0833  . methylPREDNISolone sodium succinate (SOLU-MEDROL) 125 mg/2 mL injection 60 mg  60 mg Intravenous Q12H Emokpae, Ejiroghene E, MD   60 mg at 08/25/18 8341  . ondansetron (ZOFRAN) tablet 4 mg  4 mg Oral Q6H PRN Emokpae, Ejiroghene E, MD       Or  . ondansetron (ZOFRAN) injection 4 mg  4 mg Intravenous Q6H PRN Emokpae, Ejiroghene E, MD      . polyethylene glycol (MIRALAX / GLYCOLAX) packet 17 g  17 g Oral Daily PRN Emokpae, Ejiroghene E, MD        Allergies as of 08/24/2018 - Review Complete 08/24/2018  Allergen Reaction Noted  . Acetaminophen-codeine Nausea And Vomiting 04/19/2010  . Aspirin Nausea And Vomiting 04/19/2010  . Celecoxib Other (See Comments) 04/19/2010  . Levaquin [levofloxacin in d5w] Itching 02/21/2012  . Lipitor [atorvastatin] Other (See Comments) 04/22/2013  . Sulfa antibiotics Nausea And Vomiting 02/20/2012  . Adhesive [tape] Rash 03/06/2012  . Latex Rash 04/19/2010    Family History  Problem Relation Age of Onset  . Colon polyps Mother   . Deep vein thrombosis Mother   . Heart disease Mother   . Hypertension Mother   . Bleeding Disorder Mother   . Hypertension Sister   . Heart disease Brother        before age 11  . Hypertension Brother  Social History   Socioeconomic History  . Marital status: Married    Spouse  name: Not on file  . Number of children: 2  . Years of education: Not on file  . Highest education level: Not on file  Occupational History    Comment: Disabled  Social Needs  . Financial resource strain: Not on file  . Food insecurity    Worry: Not on file    Inability: Not on file  . Transportation needs    Medical: Not on file    Non-medical: Not on file  Tobacco Use  . Smoking status: Current Every Day Smoker    Packs/day: 0.50    Years: 40.00    Pack years: 20.00    Types: Cigarettes    Start date: 09/06/1972  . Smokeless tobacco: Never Used  Substance and Sexual Activity  . Alcohol use: No    Alcohol/week: 0.0 standard drinks  . Drug use: No  . Sexual activity: Not Currently    Birth control/protection: Surgical  Lifestyle  . Physical activity    Days per week: Not on file    Minutes per session: Not on file  . Stress: Not on file  Relationships  . Social Herbalist on phone: Not on file    Gets together: Not on file    Attends religious service: Not on file    Active member of club or organization: Not on file    Attends meetings of clubs or organizations: Not on file    Relationship status: Not on file  . Intimate partner violence    Fear of current or ex partner: Not on file    Emotionally abused: Not on file    Physically abused: Not on file    Forced sexual activity: Not on file  Other Topics Concern  . Not on file  Social History Narrative  . Not on file    Review of Systems: Gen: Denies fever. Reports chills off and on.  CV: Denies chest pain, heart palpitations, syncope. Has had new onset of swelling in her lower legs for the last couple of weeks.  Resp: Feels SOB at baseline with talking, eating, exertion. No cough.  GI: See HPI  GU : Denies urinary burning, urinary frequency MS: Denies joint pain,swelling, cramping Derm: Denies rash, itching, dry skin Psych: See HPI Heme: Denies other bruising, bleeding  Physical Exam: Vital  signs in last 24 hours: Temp:  [98 F (36.7 C)-98.2 F (36.8 C)] 98.1 F (36.7 C) (07/21 0538) Pulse Rate:  [65-87] 72 (07/21 0538) Resp:  [17-24] 20 (07/21 0538) BP: (117-141)/(48-97) 141/79 (07/21 0538) SpO2:  [68 %-100 %] 94 % (07/21 0833) Weight:  [61.9 kg] 61.9 kg (07/21 0538) Last BM Date: 08/24/18 General:   Alert,  pleasant and cooperative in NAD, but noticeably getting a little short of breath with a lot of talking.  Head:  Normocephalic and atraumatic. Eyes:  Sclera clear, no icterus.  Ears:  Normal auditory acuity. Nose:  No deformity, discharge,  or lesions. Lungs:  Decreased lung sounds with crackles at the bases, worse on the right. No wheezes or rhonchi. No acute distress. Nasal canula in place.  Heart:  Regular rate and rhythm; no murmurs, clicks, rubs,  or gallops.  Abdomen:  Soft, nontender and nondistended. No masses, hepatosplenomegaly or hernias noted. Normal bowel sounds, without guarding, and without rebound.   Rectal:  Deferred Msk:  Symmetrical without gross deformities. Normal posture. Pulses:  Normal pulses  noted. Extremities: 1+ below the knee pitting edema. Without clubbing. Neurologic:  Alert and  Oriented;  grossly normal neurologically. Skin:  Intact without significant lesions or rashes. Psych:  Alert and cooperative. Normal mood and affect. Insight to her state of health somewhat limited.   Lab Results: Recent Labs    08/24/18 1408 08/25/18 0433  WBC 12.4* 7.7  HGB 7.9* 7.0*  HCT 30.1* 26.7*  PLT 363 285   BMET Recent Labs    08/24/18 1408  NA 141  K 2.6*  CL 96*  CO2 33*  GLUCOSE 144*  BUN 51*  CREATININE 2.47*  CALCIUM 8.4*   LFT Recent Labs    08/24/18 1408  PROT 6.4*  ALBUMIN 3.2*  AST 263*  ALT 118*  ALKPHOS 155*  BILITOT 1.6*   PT/INR Recent Labs    08/24/18 1529  LABPROT 14.7  INR 1.2    Studies/Results: Dg Chest Portable 1 View  Result Date: 08/24/2018 CLINICAL DATA:  Shortness of breath. Generalized  weakness for the past 2 weeks. Smoker. EXAM: PORTABLE CHEST 1 VIEW COMPARISON:  05/16/2016. FINDINGS: Interval enlarged cardiac silhouette and moderate-sized right pleural effusion with right basilar airspace opacity. Mild diffuse peribronchial thickening with mild progression. Stable old, healed left 6th rib fracture. IMPRESSION: 1. Interval cardiomegaly and moderate-sized right pleural effusion with right basilar atelectasis or pneumonia. 2. Mild bronchitic changes with mild progression. Electronically Signed   By: Claudie Revering M.D.   On: 08/24/2018 14:28    Impression: 63 y.o. female with past medical history significant for CHF, hypothyroidism, peripheral vascular disease, carotid artery stenosis, COPD, depression anxiety, hypertension, HLD. Patient presented to the ED with weakness and confusion. She was found to be hypoxic, CXR with right pleural effusion and possible pneumonia. Also with decompensated CHF and AKI. Patient was admitted for community acquired pneumonia. Patient was also found to be anemic with hemoglobin of 7.9 in ED, IFOBT positive. GI was consulted for further evaluation of anemia.   Anemia: Hemoglobin 7.9 on admission with microcytic indices. Ferritin 11, iron 9, TIBC 479, Saturation 2%. Repeat CBC today with hemoglobin 7.0. Patient reports having ongoing intermittent bright red blood per rectum and black stools for the last several years. Denies taking iron. Apparently was seen in [place] for rectal bleeding, but patient states they did nothing for this. Reports her last stool with blood as well as blacks stools was today; however, nurse reports BM was brown without blood. Denies abdominal pain. Reports hemorrhoids removed during a prior colonoscopy, but unclear when this was, and I can't find in the system. Admits to reflux symptoms that are not well controlled since stopping Zantac. No dysphagia. Reports being started on a different medication last week, but doesn't know what it  is. Occasional ibuprofen use. I do not suspect her anemia is due to acute blood loss. This is likely something that has been developing over time with chronic intermittent GI losses as well as likely chronic kidney disease and CHF contributing to her anemia. Will start on Protonix 40 mg BID and discuss role of/timing of procedures with Dr. Oneida Alar as patient has other acute issues that are being managed as well.   Elevated Transaminases with mild elevation of bilirubin also noted on admission. AST 263, ALT 118, alk phos 155, bilirubin 1.6. No recent imaging on file. No pain in RUQ. Patient reports history of Hepatitis C that was treated last year but is unable to name the MD who treated her or the clinic. No known  cirrhosis. Denies alcohol or drug use since her 94s. Patient also with acute on chronic CHF and hypoxia on admission. Hypoperfusion/hypoxia may also be contributing to elevated transaminases. Will need to recheck acute Hepatitis panel. Will also obtain abdominal US. Follow LFTs.  Plan: Start Protonix 40 mg BID orally Will place on clear for now.  Will discuss with Dr. Oneida Alar role/timing of procedures.   Follow H/H Transfuse as necessary Acute Hepatitis Panel US abdomen complete Follow LFTs     LOS: 1 day    08/25/2018, 9:08 AM   Aliene Altes, PA-C [facility]

## 2018-08-25 NOTE — Progress Notes (Signed)
2 D echo completed 

## 2018-08-26 ENCOUNTER — Inpatient Hospital Stay (HOSPITAL_COMMUNITY): Payer: Medicare Other

## 2018-08-26 DIAGNOSIS — D649 Anemia, unspecified: Secondary | ICD-10-CM

## 2018-08-26 DIAGNOSIS — R945 Abnormal results of liver function studies: Secondary | ICD-10-CM

## 2018-08-26 DIAGNOSIS — K922 Gastrointestinal hemorrhage, unspecified: Secondary | ICD-10-CM

## 2018-08-26 LAB — TYPE AND SCREEN
ABO/RH(D): AB NEG
Antibody Screen: NEGATIVE
Unit division: 0
Unit division: 0

## 2018-08-26 LAB — COMPREHENSIVE METABOLIC PANEL
ALT: 74 U/L — ABNORMAL HIGH (ref 0–44)
AST: 54 U/L — ABNORMAL HIGH (ref 15–41)
Albumin: 3 g/dL — ABNORMAL LOW (ref 3.5–5.0)
Alkaline Phosphatase: 121 U/L (ref 38–126)
Anion gap: 12 (ref 5–15)
BUN: 37 mg/dL — ABNORMAL HIGH (ref 8–23)
CO2: 34 mmol/L — ABNORMAL HIGH (ref 22–32)
Calcium: 8.9 mg/dL (ref 8.9–10.3)
Chloride: 97 mmol/L — ABNORMAL LOW (ref 98–111)
Creatinine, Ser: 1.72 mg/dL — ABNORMAL HIGH (ref 0.44–1.00)
GFR calc Af Amer: 37 mL/min — ABNORMAL LOW (ref >60)
GFR calc non Af Amer: 32 mL/min — ABNORMAL LOW (ref >60)
Glucose, Bld: 171 mg/dL — ABNORMAL HIGH (ref 70–99)
Potassium: 3.6 mmol/L (ref 3.5–5.1)
Sodium: 143 mmol/L (ref 135–145)
Total Bilirubin: 1.1 mg/dL (ref 0.3–1.2)
Total Protein: 5.7 g/dL — ABNORMAL LOW (ref 6.5–8.1)

## 2018-08-26 LAB — BPAM RBC
Blood Product Expiration Date: 202008112359
Blood Product Expiration Date: 202008192359
ISSUE DATE / TIME: 202007211426
ISSUE DATE / TIME: 202007212320
Unit Type and Rh: 600
Unit Type and Rh: 600

## 2018-08-26 LAB — CBC
HCT: 34 % — ABNORMAL LOW (ref 36.0–46.0)
Hemoglobin: 9.8 g/dL — ABNORMAL LOW (ref 12.0–15.0)
MCH: 24.4 pg — ABNORMAL LOW (ref 26.0–34.0)
MCHC: 28.8 g/dL — ABNORMAL LOW (ref 30.0–36.0)
MCV: 84.6 fL (ref 80.0–100.0)
Platelets: 190 10*3/uL (ref 150–400)
RBC: 4.02 MIL/uL (ref 3.87–5.11)
RDW: 21.1 % — ABNORMAL HIGH (ref 11.5–15.5)
WBC: 12.4 10*3/uL — ABNORMAL HIGH (ref 4.0–10.5)
nRBC: 1.7 % — ABNORMAL HIGH (ref 0.0–0.2)

## 2018-08-26 LAB — HEPATITIS PANEL, ACUTE
HCV Ab: <0.1 s/co ratio (ref 0.0–0.9)
Hep A IgM: NEGATIVE
Hep B C IgM: NEGATIVE
Hepatitis B Surface Ag: NEGATIVE

## 2018-08-26 LAB — VITAMIN B12: Vitamin B-12: 547 pg/mL (ref 180–914)

## 2018-08-26 NOTE — Progress Notes (Signed)
PROGRESS NOTE    [name]  BUL:[id number] DOB: 1955/11/18 DOA: 08/24/2018 PCP: Jani Gravel, MD   Brief Narrative:  Per HPI: [name] is a 63 y.o. female with medical history significant for CHF, hypothyroidism, peripheral vascular disease, COPD, depression anxiety, hypertension, subclavian steal syndrome, CVA. At the time of my evaluation patient is alert and oriented, able to give me a history.  Who presented to the ED with complaints of generalized weakness of 2 weeks, and confusion at night.  Patient reports over the past 2 weeks she has barely eaten or drank anything that she has felt so depressed.  No abdominal pain, no vomiting, she reports some occasional loose stools.  She reports chronic difficulty breathing, but is unable to tell me if this has changed recently.  She denies cough.  No chest pain.  No fever or chills.  She reports bilateral lower extremity swelling of about 2 weeks duration now.  She reports her normal weight is about  115 pounds.  She denies being on any fluid pills.  She denies vomiting of blood, denies black stools or bloody stools.  She denies NSAID use.  She is unaware of a history of heart disease or stroke, though both of these are listed on her medical problems. Patient is supposed to be on CPAP at night and home oxygen, both of which she has not used in at least 4 years.  Per patient spouse-he has been seeing blood in patient's underwear, and patient has been admitted to [facility] for same.  Patient was admitted with acute hypoxemic respiratory failure likely secondary to some pulmonary edema with effusions as well as pneumonia.  She additionally was noted to have acute kidney injury and acute blood loss anemia secondary to presumed GI bleed.  She apparently has had recent colonoscopy at [facility].  GI is awaiting further records and is considering EGD.  Assessment & Plan:   Principal Problem:   Acute respiratory failure with hypoxia  (HCC) Active Problems:   Anxiety and depression   HTN (hypertension)   COPD exacerbation (HCC)   Hypokalemia   Elevated LFTs   Anemia   Gastrointestinal hemorrhage   A/p Acute respiratory failure with hypoxia-with reported dyspnea, --- most likely due to pneumonia and CHF, continue supplemental oxygen --We will attempt to see if she qualifies for home O2 prior to discharge home  HFpEF/dCHF history- decompensated.  New 1+ pitting pedal edema bilaterally, marked elevated BNP 1260 ( was 67  four years ago) chest x-ray with moderate sized right pleural effusion, no echo on file.  Not on diuretics.  She is unaware if she has gained weight.  Denies chest pain.  EKG unchanged.   -IV Lasix 40 mg q12h,   -Strict input output, daily weights -BMP a.m. -Echocardiogram with LVEF 50-55%  Acute kidney injury-creatinine was 2.47, improving, creatinine is down to 1.72,   Last check 4 years ago baseline 0.6 - 0.7.  --- Continue gentle diuresis-BMP a.m.  Generalized weakness-likely multifactorial from hypokalemia, depression, poor p.o. intake, pneumonia, and hypoxia, possibly anemia contributing. -Plan per above  Metabolic encephalopathy-likely from hypoxia/hypercapnia due to CPAP non-compliance,  and probably pneumonia.  Mental status changes have resolved, patient is back to baseline   Acute iron deficiency anemia-hemoglobin is up to 9.8 post transfusion of 2 units of packed cells, Last check 4 years ago baseline 11-12.  Stool occult positive.  She denies black stools or blood loss, or abdominal pain..  Denies NSAID use.  No Colonoscopy or EGD on file.  Per patient spouse-he has been seeing blood in patient's underwear, and patient has been admitted to [facility] for same. S/p  Feraheme transfusion  - CBC a.m -GI following with possible EGD soon and records being obtained from [facility] --GI consult appreciated and recommend outpatient endoluminal evaluation when respiratory status is  improved  Tobacco abuse/COPD exacerbation/OSA-  -   Hypoxia persist, smoking cessation advised, continue bronchodilators and steroids, as well as rocephin/rocephin --- Continue CPAP and supplemental oxygen  Anxiety/depression-  reports depressed feelings over the past 2 weeks, which resulted in poor p.o. intake.  Denies suicidal or homicidal ideations.   -TTS psychiatry consultation appreciated, they recommend outpatient follow-up   DVT prophylaxis: SCDs Code Status: Full Family Communication: Discussed with Husband by phone--- [phone] Disposition Plan: Continue treatment for pneumonia, CHF and COPD.  Appreciate GI evaluation with likely need for endoscopy once medically stable.  Consultants:   GI/psych  Procedures:   None  Antimicrobials:  Anti-infectives (From admission, onward)   Start     Dose/Rate Route Frequency Ordered Stop   08/25/18 1400  cefTRIAXone (ROCEPHIN) 1 g in sodium chloride 0.9 % 100 mL IVPB  Status:  Discontinued     1 g 200 mL/hr over 30 Minutes Intravenous Every 24 hours 08/24/18 1717 08/24/18 1725   08/25/18 1400  azithromycin (ZITHROMAX) 500 mg in sodium chloride 0.9 % 250 mL IVPB     500 mg 250 mL/hr over 60 Minutes Intravenous Every 24 hours 08/24/18 1717     08/25/18 1200  cefTRIAXone (ROCEPHIN) 2 g in sodium chloride 0.9 % 100 mL IVPB     2 g 200 mL/hr over 30 Minutes Intravenous Every 24 hours 08/24/18 1725     08/24/18 1445  cefTRIAXone (ROCEPHIN) 1 g in sodium chloride 0.9 % 100 mL IVPB     1 g 200 mL/hr over 30 Minutes Intravenous  Once 08/24/18 1434 08/24/18 1534   08/24/18 1445  azithromycin (ZITHROMAX) 500 mg in sodium chloride 0.9 % 250 mL IVPB     500 mg 250 mL/hr over 60 Minutes Intravenous  Once 08/24/18 1434 08/24/18 1636      Subjective: Shortness of breath persist, cough persist, no fevers, oral intake is fair.  No Nausea, Vomiting or Diarrhea  Objective: Vitals:   08/26/18 0217 08/26/18 0543 08/26/18 0725 08/26/18 1514    BP: (!) 149/76 (!) 99/53  126/60  Pulse: 77 71  86  Resp: (!) 22 (!) 24  18  Temp: 98.6 F (37 C) 97.7 F (36.5 C)  98.2 F (36.8 C)  TempSrc: Oral Oral  Oral  SpO2: 95% 94% 95% (!) 82%  Weight:  64.4 kg      Intake/Output Summary (Last 24 hours) at 08/26/2018 1813 Last data filed at 08/26/2018 1500 Gross per 24 hour  Intake 930 ml  Output 800 ml  Net 130 ml   Filed Weights   08/25/18 0538 08/26/18 0543  Weight: 61.9 kg 64.4 kg    Examination:  Physical Exam  Gen:- Awake Alert, dyspnea on minimal exertion HEENT:- [place].AT, No sclera icterus Nose- [place] 3L/min Neck-Supple Neck,No JVD,.  Lungs-diminished in bases with scattered rhonchi, CV- S1, S2 normal Abd-  +ve B.Sounds, Abd Soft, No tenderness,    Extremity/Skin:- +2  edema, intact pedal pulses Psych-affect is appropriate  (affect is better), oriented x3 Neuro-generalized weakness, but no new focal deficits, no tremors  CBC: Recent Labs  Lab 08/24/18 1408 08/25/18 0433 08/26/18 0604  WBC 12.4* 7.7 12.4*  NEUTROABS 10.0*  --   --   HGB 7.9* 7.0* 9.8*  HCT 30.1* 26.7* 34.0*  MCV 80.3 81.4 84.6  PLT 363 285 833   Basic Metabolic Panel: Recent Labs  Lab 08/24/18 1408 08/24/18 1529 08/25/18 1331 08/26/18 0604  NA 141  --  143 143  K 2.6*  --  3.8 3.6  CL 96*  --  98 97*  CO2 33*  --  32 34*  GLUCOSE 144*  --  227* 171*  BUN 51*  --  45* 37*  CREATININE 2.47*  --  2.08* 1.72*  CALCIUM 8.4*  --  8.6* 8.9  MG  --  2.0 1.6*  --    GFR: CrCl cannot be calculated (Unknown ideal weight.). Liver Function Tests: Recent Labs  Lab 08/24/18 1408 08/26/18 0604  AST 263* 54*  ALT 118* 74*  ALKPHOS 155* 121  BILITOT 1.6* 1.1  PROT 6.4* 5.7*  ALBUMIN 3.2* 3.0*   No results for input(s): LIPASE, AMYLASE in the last 168 hours. Recent Labs  Lab 08/24/18 1529  AMMONIA 19   Coagulation Profile: Recent Labs  Lab 08/24/18 1529  INR 1.2   Cardiac Enzymes: No results for input(s): CKTOTAL, CKMB, CKMBINDEX,  TROPONINI in the last 168 hours. BNP (last 3 results) No results for input(s): PROBNP in the last 8760 hours. HbA1C: No results for input(s): HGBA1C in the last 72 hours. CBG: No results for input(s): GLUCAP in the last 168 hours. Lipid Profile: No results for input(s): CHOL, HDL, LDLCALC, TRIG, CHOLHDL, LDLDIRECT in the last 72 hours. Thyroid Function Tests: Recent Labs    08/24/18 1408  TSH 3.669   Anemia Panel: Recent Labs    08/24/18 1602 08/26/18 0604  VITAMINB12  --  547  FERRITIN 11  --   TIBC 479*  --   IRON 9*  --    Sepsis Labs: No results for input(s): PROCALCITON, LATICACIDVEN in the last 168 hours.  Recent Results (from the past 240 hour(s))  SARS Coronavirus 2 (CEPHEID - Performed in [place] hospital lab), Hosp Order     Status: None   Collection Time: 08/24/18  2:09 PM   Specimen: Nasopharyngeal Swab  Result Value Ref Range Status   SARS Coronavirus 2 NEGATIVE NEGATIVE Final    Comment: (NOTE) If result is NEGATIVE SARS-CoV-2 target nucleic acids are NOT DETECTED. The SARS-CoV-2 RNA is generally detectable in upper and lower  respiratory specimens during the acute phase of infection. The lowest  concentration of SARS-CoV-2 viral copies this assay can detect is 250  copies / mL. A negative result does not preclude SARS-CoV-2 infection  and should not be used as the sole basis for treatment or other  patient management decisions.  A negative result may occur with  improper specimen collection / handling, submission of specimen other  than nasopharyngeal swab, presence of viral mutation(s) within the  areas targeted by this assay, and inadequate number of viral copies  (<250 copies / mL). A negative result must be combined with clinical  observations, patient history, and epidemiological information. If result is POSITIVE SARS-CoV-2 target nucleic acids are DETECTED. The SARS-CoV-2 RNA is generally detectable in upper and lower  respiratory specimens  dur ing the acute phase of infection.  Positive  results are indicative of active infection with SARS-CoV-2.  Clinical  correlation with patient history and other diagnostic information is  necessary to determine patient infection status.  Positive results do  not  rule out bacterial infection or co-infection with other viruses. If result is PRESUMPTIVE POSTIVE SARS-CoV-2 nucleic acids MAY BE PRESENT.   A presumptive positive result was obtained on the submitted specimen  and confirmed on repeat testing.  While 2019 novel coronavirus  (SARS-CoV-2) nucleic acids may be present in the submitted sample  additional confirmatory testing may be necessary for epidemiological  and / or clinical management purposes  to differentiate between  SARS-CoV-2 and other Sarbecovirus currently known to infect humans.  If clinically indicated additional testing with an alternate test  methodology [phone]) is advised. The SARS-CoV-2 RNA is generally  detectable in upper and lower respiratory sp ecimens during the acute  phase of infection. The expected result is Negative. Fact Sheet for Patients:  StrictlyIdeas.no Fact Sheet for Healthcare Providers: BankingDealers.co.za This test is not yet approved or cleared by the [place] FDA and has been authorized for detection and/or diagnosis of SARS-CoV-2 by FDA under an Emergency Use Authorization (EUA).  This EUA will remain in effect (meaning this test can be used) for the duration of the COVID-19 declaration under Section 564(b)(1) of the Act, 21 U.S.C. section 360bbb-3(b)(1), unless the authorization is terminated or revoked sooner. Performed at [facility], [address] [place], [place] [address]      Radiology Studies: Dg Chest 2 View  Result Date: 08/26/2018 CLINICAL DATA:  Shortness of breath. EXAM: CHEST - 2 VIEW COMPARISON:  Chest x-ray dated August 24, 2018. FINDINGS: Stable enlarged  cardiopericardial silhouette with slightly globular appearance. Worsened diffuse mild interstitial pulmonary edema. Unchanged moderate right pleural effusion with adjacent right basilar opacity. Trace left pleural effusion. No pneumothorax. No acute osseous abnormality. IMPRESSION: 1. Worsened diffuse mild interstitial pulmonary edema. Unchanged moderate right pleural effusion with adjacent right basilar atelectasis versus infiltrate. 2. Stable enlarged, globular appearance of the cardiopericardial silhouette which could reflect cardiomegaly and/or pericardial effusion. Electronically Signed   By: Titus Dubin M.D.   On: 08/26/2018 14:27   US Abdomen Complete  Result Date: 08/25/2018 CLINICAL DATA:  63 year old presenting with elevated liver function tests and hyperbilirubinemia. Surgical history includes cholecystectomy. EXAM: ABDOMEN ULTRASOUND COMPLETE COMPARISON:  CT abdomen pelvis 06/11/2012. FINDINGS: Gallbladder: Surgically absent. Common bile duct: Diameter: Approximately 7 mm. No obstructing bile duct stones or mass. Unchanged since the prior CT. Liver: Diffusely increased and coarsened echotexture without focal hepatic parenchymal abnormality. Portal vein is patent on color Doppler imaging with normal direction of blood flow towards the liver. IVC: Patent. Pancreas: While difficult to visualize in its entirety, visualized portions normal in appearance. Spleen: Normal size and echotexture without focal parenchymal abnormality. Right Kidney: Length: Approximately 11.7 cm. No hydronephrosis. Well-preserved cortex. No shadowing calculi. Normal parenchymal echotexture. No focal parenchymal abnormality. Left Kidney: Length: Approximately 9.9 cm. No hydronephrosis. Well-preserved cortex. No shadowing calculi. Normal parenchymal echotexture. No focal parenchymal abnormality. Abdominal aorta: Normal in caliber throughout its visualized course in the abdomen with evidence of atherosclerosis. Maximum diameter  2.9 cm approximately. Other findings: None. IMPRESSION: 1. Diffuse hepatic steatosis and/or hepatocellular disease without focal hepatic parenchymal abnormality. 2. No unexpected biliary ductal dilation post cholecystectomy. The common bile duct measures 7 mm in diameter and is unchanged from a prior CT in 2014. 3.  Aortic Atherosclerosis (ICD10-170.0) Electronically Signed   By: Evangeline Dakin M.D.   On: 08/25/2018 13:51    Scheduled Meds:  sodium chloride   Intravenous Once   DULoxetine  60 mg Oral BID   feeding supplement (ENSURE ENLIVE)  237 mL Oral  BID BM   fluticasone  2 spray Each Nare Daily   furosemide  40 mg Intravenous Q12H   levothyroxine  25 mcg Oral q morning - 10a   methylPREDNISolone (SOLU-MEDROL) injection  40 mg Intravenous Q12H   pantoprazole  40 mg Oral BID AC   topiramate  50 mg Oral BID   umeclidinium bromide  1 puff Inhalation Daily   Continuous Infusions:  sodium chloride 250 mL (08/25/18 1200)   azithromycin 500 mg (08/26/18 1357)   cefTRIAXone (ROCEPHIN)  IV 2 g (08/26/18 1258)     LOS: 2 days   Roxan Hockey, MD [facility] If 7PM-7AM, please contact night-coverage [email or web address] Password [facility] 08/26/2018, 6:13 PM

## 2018-08-26 NOTE — Progress Notes (Addendum)
Subjective:  Patient reports 2-3 loose stools per day since she has been going through worsening depression and not eating solid foods. No BM over night. Denies Pepto. Stools "black" with some red blood "at the end". Tells me she has history of eating disorder but denies induced vomiting. Difficult obtaining further history regarding this, states she has always had eating disorder because she grew up with five kids in the house.   Objective: Vital signs in last 24 hours: Temp:  [97.6 F (36.4 C)-98.6 F (37 C)] 97.7 F (36.5 C) (07/22 0543) Pulse Rate:  [69-78] 71 (07/22 0543) Resp:  [20-24] 24 (07/22 0543) BP: (90-149)/(53-87) 99/53 (07/22 0543) SpO2:  [91 %-98 %] 95 % (07/22 0725) Weight:  [64.4 kg] 64.4 kg (07/22 0543) Last BM Date: 08/25/18 General:   Alert,  Well-developed, well-nourished, pleasant and cooperative in NAD Head:  Normocephalic and atraumatic. Eyes:  Sclera clear, no icterus.  Abdomen:  Soft, nontender and nondistended.  Normal bowel sounds, without guarding, and without rebound. 1-2+ pitting edema in flanks/dependent areas Extremities:  Without clubbing, deformity. 1+pitting edema to knees.  Neurologic:  Alert and  oriented x4;  grossly normal neurologically. Skin:  Intact without significant lesions or rashes. Psych:  Alert and cooperative. Normal mood and affect.  Intake/Output from previous day: 07/21 0701 - 07/22 0700 In: 1076.4 [I.V.:214.4; Blood:645; IV QASTMHDQQ:229] Out: -  Intake/Output this shift: No intake/output data recorded.  Lab Results: CBC Recent Labs    08/24/18 1408 08/25/18 0433 08/26/18 0604  WBC 12.4* 7.7 12.4*  HGB 7.9* 7.0* 9.8*  HCT 30.1* 26.7* 34.0*  MCV 80.3 81.4 84.6  PLT 363 285 190   BMET Recent Labs    08/24/18 1408 08/25/18 1331 08/26/18 0604  NA 141 143 143  K 2.6* 3.8 3.6  CL 96* 98 97*  CO2 33* 32 34*  GLUCOSE 144* 227* 171*  BUN 51* 45* 37*  CREATININE 2.47* 2.08* 1.72*  CALCIUM 8.4* 8.6* 8.9    LFTs Recent Labs    08/24/18 1408 08/26/18 0604  BILITOT 1.6* 1.1  ALKPHOS 155* 121  AST 263* 54*  ALT 118* 74*  PROT 6.4* 5.7*  ALBUMIN 3.2* 3.0*   No results for input(s): LIPASE in the last 72 hours. PT/INR Recent Labs    08/24/18 1529  LABPROT 14.7  INR 1.2      Imaging Studies: US Abdomen Complete  Result Date: 08/25/2018 CLINICAL DATA:  63 year old presenting with elevated liver function tests and hyperbilirubinemia. Surgical history includes cholecystectomy. EXAM: ABDOMEN ULTRASOUND COMPLETE COMPARISON:  CT abdomen pelvis 06/11/2012. FINDINGS: Gallbladder: Surgically absent. Common bile duct: Diameter: Approximately 7 mm. No obstructing bile duct stones or mass. Unchanged since the prior CT. Liver: Diffusely increased and coarsened echotexture without focal hepatic parenchymal abnormality. Portal vein is patent on color Doppler imaging with normal direction of blood flow towards the liver. IVC: Patent. Pancreas: While difficult to visualize in its entirety, visualized portions normal in appearance. Spleen: Normal size and echotexture without focal parenchymal abnormality. Right Kidney: Length: Approximately 11.7 cm. No hydronephrosis. Well-preserved cortex. No shadowing calculi. Normal parenchymal echotexture. No focal parenchymal abnormality. Left Kidney: Length: Approximately 9.9 cm. No hydronephrosis. Well-preserved cortex. No shadowing calculi. Normal parenchymal echotexture. No focal parenchymal abnormality. Abdominal aorta: Normal in caliber throughout its visualized course in the abdomen with evidence of atherosclerosis. Maximum diameter 2.9 cm approximately. Other findings: None. IMPRESSION: 1. Diffuse hepatic steatosis and/or hepatocellular disease without focal hepatic parenchymal abnormality. 2. No unexpected biliary ductal dilation post  cholecystectomy. The common bile duct measures 7 mm in diameter and is unchanged from a prior CT in 2014. 3.  Aortic Atherosclerosis  (ICD10-170.0) Electronically Signed   By: Evangeline Dakin M.D.   On: 08/25/2018 13:51   Dg Chest Portable 1 View  Result Date: 08/24/2018 CLINICAL DATA:  Shortness of breath. Generalized weakness for the past 2 weeks. Smoker. EXAM: PORTABLE CHEST 1 VIEW COMPARISON:  05/16/2016. FINDINGS: Interval enlarged cardiac silhouette and moderate-sized right pleural effusion with right basilar airspace opacity. Mild diffuse peribronchial thickening with mild progression. Stable old, healed left 6th rib fracture. IMPRESSION: 1. Interval cardiomegaly and moderate-sized right pleural effusion with right basilar atelectasis or pneumonia. 2. Mild bronchitic changes with mild progression. Electronically Signed   By: Claudie Revering M.D.   On: 08/24/2018 14:28  [2 weeks]   Assessment: 63 y/o female presenting with weakness, confusion. Found to be hypoxic, CXR with right pleural effusion and ?PNA. Also with decompensated CHF and AKI. She was found to be anemic, Hgb 7.9. Baseline unavailable. IFOBT positive. Anemia labs with trend towards IDA but likely also multifactorial in setting of chronic renal insufficiency/CHF. B12/folate pending.   Chronic intermittent brbpr. No overt GI bleeding this admission. Patient reports black stools but denies iron/pepto. Trying to obtain most recent colonoscopy report. Consider EGD once cardiopulmonary status improved.   Patient reports poor oral intake. Tolerating liquids but not eating solid food for weeks. Denies dysphagia. Complains of poorly controlled reflux and significant depression. Reports long history of eating disorder but difficult to obtain further history. Denies induced vomiting.   Elevated LFTs improving. Likely due to ischemic hepatitis. Patient reports h/o HCV ?treated.   Plan: 1. Full liquids. 2. Await pending labs.  3. Continue PPI BID.  4. Await copy of last colonoscopy.  5. Continue to follow with you.   Laureen Ochs. Bernarda Caffey [facility] [phone] 7/22/20208:39 AM     LOS: 2 days    Addendum: no colonoscopy reports available at [facility] within the last 10 years.    Laureen Ochs. Bernarda Caffey [facility] [phone] 7/22/202010:40 AM

## 2018-08-26 NOTE — TOC Initial Note (Addendum)
Transition of Care [facility]) - Initial/Assessment Note    Patient Details  Name: [name] MRN: [id number] Date of Birth: 1955/09/25  Transition of Care [facility]) CM/SW Contact:    Shade Flood, LCSW Phone Number: 08/26/2018, 12:49 PM  Clinical Narrative:                  Pt admitted from home. Reviewed pt's record and discussed pt's status with MD in [place]. MD anticipating home with [facility] tomorrow. Pt also may need Home O2 arranged.   Met with pt to assess. Pt reports that she lives with her husband at home. She states she has been depressed and that's how she ended up at the point where she needed to come to the hospital because she stopped taking care of herself. Emotional support provided to pt who denies suicidal thoughts or plan. Pt states that she needs to start taking her antidepressant again and she needs to be home with her dog. Pt states she hasn't been needing to use it, but she does have a walker with a seat at home.  Discussed option of HH RN and CSW with pt who is agreeable. Referral made to [facility] at pt request. They will accept pt and follow at dc. Also referred pt to [place] [facility].  Updated MD. Donella Stade will follow up tomorrow to further assist with dc planning needs.  Expected Discharge Plan: [place] Barriers to Discharge: Continued Medical Work up   Patient Goals and CMS Choice Patient states their goals for this hospitalization and ongoing recovery are:: feel better and go home CMS Medicare.gov Compare Post Acute Care list provided to:: Patient Choice offered to / list presented to : Patient  Expected Discharge Plan and Services Expected Discharge Plan: [place] In-house Referral: Clinical Social Work   Post Acute Care Choice: [place] arrangements for the past 2 months: [place]: RN, Social Work [place] Agency: [place] ([place]) Date Center: 08/26/18 Time [place]: [place] Representative spoke with at [place] Arrangements/Services Living arrangements for the past 2 months: [place] with:: Spouse, Pets Patient language and need for interpreter reviewed:: Yes Do you feel safe going back to the place where you live?: Yes      Need for Family Participation in Patient Care: Yes (Comment) Care giver support system in place?: Yes (comment)   Criminal Activity/Legal Involvement Pertinent to Current Situation/Hospitalization: No - Comment as needed  Activities of Daily Living Home Assistive Devices/Equipment: None ADL Screening (condition at time of admission) Patient's cognitive ability adequate to safely complete daily activities?: Yes Is the patient deaf or have difficulty hearing?: No Does the patient have difficulty seeing, even when wearing glasses/contacts?: No Does the patient have difficulty concentrating, remembering, or making decisions?: No Patient able to express need for assistance with ADLs?: Yes Does the patient have difficulty dressing or bathing?: No Independently performs ADLs?: Yes (appropriate for developmental age) Does the patient have difficulty walking or climbing stairs?: Yes Weakness of Legs: Both Weakness of Arms/Hands: None  Permission Sought/Granted Permission sought to share information with : Facility Art therapist granted to share information with : Yes, Verbal Permission Granted     Permission granted to share info w AGENCY: any  home health        Emotional Assessment Appearance:: Appears stated age   Affect (typically observed): Sad Orientation: : Oriented to Self, Oriented to Place, Oriented to  Time, Oriented to Situation Alcohol / Substance Use: Not Applicable Psych Involvement: No (comment)  Admission diagnosis:  Hypokalemia [E87.6] Pleural effusion [J90] Hypoxia [R09.02] AKI (acute kidney  injury) ([place]) [N17.9] Gastrointestinal hemorrhage, unspecified gastrointestinal hemorrhage type [K92.2] Anemia, unspecified type [D64.9] Depression, unspecified depression type [F32.9] Community acquired pneumonia of right lung, unspecified part of lung [J18.9] Patient Active Problem List   Diagnosis Date Noted  . Anemia   . Gastrointestinal hemorrhage   . Elevated LFTs   . Hypokalemia 08/24/2018  . COPD exacerbation ([place]) 03/18/2014  . Acute kidney injury ([place]) 03/18/2014  . Acute respiratory failure with hypoxia ([place]) 03/18/2014  . CAP (community acquired pneumonia) 03/17/2014  . Pneumonia 03/17/2014  . HTN (hypertension) 12/03/2012  . Sepsis ([place]) 02/21/2012  . PNA (pneumonia) 02/21/2012  . Adrenal insufficiency ([place]) 02/21/2012  . Subclavian steal syndrome 02/21/2012  . Chronic pain 11/22/2010  . Migraine headache 11/22/2010  . Gastroesophageal reflux disease 11/22/2010  . Laboratory test 11/22/2010  . Cerebrovascular disease 07/24/2010  . Anxiety and depression   . Hyperlipidemia   . Hypothyroid   . Tobacco abuse    PCP:  Jani Gravel, MD Pharmacy:   [facility], [address] [place] [address] Phone: [phone] Fax: [phone]     Social Determinants of Health (SDOH) Interventions    Readmission Risk Interventions Readmission Risk Prevention Plan 08/26/2018  Transportation Screening Complete  Home Care Screening Complete  Medication Review (RN CM) Complete  Some recent data might be hidden

## 2018-08-26 NOTE — [place] Notes (Signed)
Pt c/o feeling more depressed today, "feel like a crying jag is coming on". Requested and given Xanax per order. TED hose applied per order.
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
## 2018-08-26 NOTE — Progress Notes (Signed)
Pt sipping on po fluids, states nausea is better but states just has no appetite for food right now. Assisted to bathroom, voided 162ml clear yellow urine. A little unsteady on her feet, needs standby assist x1 when OOB.

## 2018-08-26 NOTE — Progress Notes (Signed)
O2 was removed per MD order at 0850. Recheck of SaO2 was 82% on RA at 0920. O2 reapplied at 2lpm [place], SaO2 up to 96%. MD notified.  Pt also c/o nausea, Zofran 4mg  IV given per order.

## 2018-08-26 NOTE — Care Management Important Message (Signed)
Important Message  Patient Details  Name: [name] MRN: [id number] Date of Birth: [birth year]   Medicare Important Message Given:  Yes     Tommy Medal 08/26/2018, 1:46 PM

## 2018-08-26 NOTE — Plan of Care (Signed)

## 2018-08-27 ENCOUNTER — Inpatient Hospital Stay (HOSPITAL_COMMUNITY): Payer: Medicare Other

## 2018-08-27 ENCOUNTER — Telehealth: Payer: Self-pay | Admitting: Gastroenterology

## 2018-08-27 DIAGNOSIS — D649 Anemia, unspecified: Secondary | ICD-10-CM | POA: Diagnosis not present

## 2018-08-27 DIAGNOSIS — G458 Other transient cerebral ischemic attacks and related syndromes: Secondary | ICD-10-CM | POA: Diagnosis not present

## 2018-08-27 DIAGNOSIS — K219 Gastro-esophageal reflux disease without esophagitis: Secondary | ICD-10-CM | POA: Diagnosis not present

## 2018-08-27 DIAGNOSIS — E039 Hypothyroidism, unspecified: Secondary | ICD-10-CM | POA: Diagnosis not present

## 2018-08-27 DIAGNOSIS — G43909 Migraine, unspecified, not intractable, without status migrainosus: Secondary | ICD-10-CM | POA: Diagnosis not present

## 2018-08-27 DIAGNOSIS — I700 Atherosclerosis of aorta: Secondary | ICD-10-CM | POA: Diagnosis not present

## 2018-08-27 DIAGNOSIS — J440 Chronic obstructive pulmonary disease with acute lower respiratory infection: Secondary | ICD-10-CM | POA: Diagnosis not present

## 2018-08-27 DIAGNOSIS — Z7951 Long term (current) use of inhaled steroids: Secondary | ICD-10-CM | POA: Diagnosis not present

## 2018-08-27 DIAGNOSIS — G8929 Other chronic pain: Secondary | ICD-10-CM | POA: Diagnosis not present

## 2018-08-27 DIAGNOSIS — J441 Chronic obstructive pulmonary disease with (acute) exacerbation: Secondary | ICD-10-CM | POA: Diagnosis not present

## 2018-08-27 DIAGNOSIS — I110 Hypertensive heart disease with heart failure: Secondary | ICD-10-CM | POA: Diagnosis not present

## 2018-08-27 DIAGNOSIS — J9601 Acute respiratory failure with hypoxia: Secondary | ICD-10-CM | POA: Diagnosis not present

## 2018-08-27 DIAGNOSIS — J189 Pneumonia, unspecified organism: Secondary | ICD-10-CM | POA: Diagnosis not present

## 2018-08-27 DIAGNOSIS — I739 Peripheral vascular disease, unspecified: Secondary | ICD-10-CM | POA: Diagnosis not present

## 2018-08-27 DIAGNOSIS — Z79899 Other long term (current) drug therapy: Secondary | ICD-10-CM | POA: Diagnosis not present

## 2018-08-27 DIAGNOSIS — E785 Hyperlipidemia, unspecified: Secondary | ICD-10-CM | POA: Diagnosis not present

## 2018-08-27 DIAGNOSIS — I509 Heart failure, unspecified: Secondary | ICD-10-CM | POA: Diagnosis not present

## 2018-08-27 DIAGNOSIS — Z9049 Acquired absence of other specified parts of digestive tract: Secondary | ICD-10-CM | POA: Diagnosis not present

## 2018-08-27 LAB — GRAM STAIN

## 2018-08-27 LAB — FOLATE RBC
Folate, Hemolysate: 445 ng/mL
Folate, RBC: 1369 ng/mL (ref >498)
Hematocrit: 32.5 % — ABNORMAL LOW (ref 34.0–46.6)

## 2018-08-27 LAB — COMPREHENSIVE METABOLIC PANEL
ALT: 60 U/L — ABNORMAL HIGH (ref 0–44)
AST: 29 U/L (ref 15–41)
Albumin: 3 g/dL — ABNORMAL LOW (ref 3.5–5.0)
Alkaline Phosphatase: 113 U/L (ref 38–126)
Anion gap: 12 (ref 5–15)
BUN: 32 mg/dL — ABNORMAL HIGH (ref 8–23)
CO2: 35 mmol/L — ABNORMAL HIGH (ref 22–32)
Calcium: 8.9 mg/dL (ref 8.9–10.3)
Chloride: 93 mmol/L — ABNORMAL LOW (ref 98–111)
Creatinine, Ser: 1.51 mg/dL — ABNORMAL HIGH (ref 0.44–1.00)
GFR calc Af Amer: 43 mL/min — ABNORMAL LOW (ref >60)
GFR calc non Af Amer: 37 mL/min — ABNORMAL LOW (ref >60)
Glucose, Bld: 106 mg/dL — ABNORMAL HIGH (ref 70–99)
Potassium: 3.4 mmol/L — ABNORMAL LOW (ref 3.5–5.1)
Sodium: 140 mmol/L (ref 135–145)
Total Bilirubin: 0.9 mg/dL (ref 0.3–1.2)
Total Protein: 5.7 g/dL — ABNORMAL LOW (ref 6.5–8.1)

## 2018-08-27 LAB — CBC
HCT: 34.3 % — ABNORMAL LOW (ref 36.0–46.0)
Hemoglobin: 9.6 g/dL — ABNORMAL LOW (ref 12.0–15.0)
MCH: 24.3 pg — ABNORMAL LOW (ref 26.0–34.0)
MCHC: 28 g/dL — ABNORMAL LOW (ref 30.0–36.0)
MCV: 86.8 fL (ref 80.0–100.0)
Platelets: 179 10*3/uL (ref 150–400)
RBC: 3.95 MIL/uL (ref 3.87–5.11)
RDW: 22.3 % — ABNORMAL HIGH (ref 11.5–15.5)
WBC: 12.1 10*3/uL — ABNORMAL HIGH (ref 4.0–10.5)
nRBC: 2.6 % — ABNORMAL HIGH (ref 0.0–0.2)

## 2018-08-27 LAB — PROTEIN, PLEURAL OR PERITONEAL FLUID: Total protein, fluid: <3 g/dL

## 2018-08-27 LAB — LACTATE DEHYDROGENASE, PLEURAL OR PERITONEAL FLUID: LD, Fluid: 81 U/L — ABNORMAL HIGH (ref 3–23)

## 2018-08-27 LAB — GLUCOSE, PLEURAL OR PERITONEAL FLUID: Glucose, Fluid: 144 mg/dL

## 2018-08-27 MED ORDER — PANTOPRAZOLE SODIUM 40 MG PO TBEC: 40.0000 mg | DELAYED_RELEASE_TABLET | Freq: Every day | ORAL | 3 refills | Status: DC

## 2018-08-27 MED ORDER — ACETAMINOPHEN 325 MG PO TABS: 650.0000 mg | ORAL_TABLET | Freq: Four times a day (QID) | ORAL | 2 refills | Status: DC | PRN

## 2018-08-27 MED ORDER — FUROSEMIDE 10 MG/ML IJ SOLN
60.0000 mg | Freq: Once | INTRAMUSCULAR | Status: AC
  Administered 2018-08-27: 60 mg via INTRAVENOUS
  Filled 2018-08-27: qty 6

## 2018-08-27 MED ORDER — POTASSIUM CHLORIDE CRYS ER 20 MEQ PO TBCR
40.0000 meq | EXTENDED_RELEASE_TABLET | Freq: Once | ORAL | Status: AC
  Administered 2018-08-27: 40 meq via ORAL
  Filled 2018-08-27: qty 2

## 2018-08-27 MED ORDER — ALBUTEROL SULFATE HFA 108 (90 BASE) MCG/ACT IN AERS: 2.0000 | INHALATION_SPRAY | RESPIRATORY_TRACT | 1 refills | Status: DC | PRN

## 2018-08-27 MED ORDER — PREDNISONE 20 MG PO TABS: 40.0000 mg | ORAL_TABLET | Freq: Every day | ORAL | 0 refills | Status: AC

## 2018-08-27 MED ORDER — DULOXETINE HCL 60 MG PO CPEP: 60.0000 mg | ORAL_CAPSULE | Freq: Two times a day (BID) | ORAL | 3 refills | Status: DC

## 2018-08-27 MED ORDER — LEVOTHYROXINE SODIUM 25 MCG PO TABS: 25.0000 ug | ORAL_TABLET | Freq: Every morning | ORAL | 3 refills | Status: DC

## 2018-08-27 MED ORDER — ALBUTEROL SULFATE (2.5 MG/3ML) 0.083% IN NEBU: 2.5000 mg | INHALATION_SOLUTION | RESPIRATORY_TRACT | 12 refills | Status: DC | PRN

## 2018-08-27 MED ORDER — CEFDINIR 300 MG PO CAPS: 300.0000 mg | ORAL_CAPSULE | Freq: Two times a day (BID) | ORAL | 0 refills | Status: AC

## 2018-08-27 MED ORDER — GUAIFENESIN ER 600 MG PO TB12
600.0000 mg | ORAL_TABLET | Freq: Two times a day (BID) | ORAL | Status: DC
  Administered 2018-08-27: 600 mg via ORAL
  Filled 2018-08-27: qty 1

## 2018-08-27 MED ORDER — METOLAZONE 5 MG PO TABS
5.0000 mg | ORAL_TABLET | Freq: Once | ORAL | Status: AC
  Administered 2018-08-27: 5 mg via ORAL
  Filled 2018-08-27: qty 1

## 2018-08-27 MED ORDER — ALPRAZOLAM 1 MG PO TABS: 0.5000 mg | ORAL_TABLET | Freq: Three times a day (TID) | ORAL | 0 refills | Status: DC | PRN

## 2018-08-27 MED ORDER — GUAIFENESIN ER 600 MG PO TB12: 600.0000 mg | ORAL_TABLET | Freq: Two times a day (BID) | ORAL | 0 refills | Status: AC

## 2018-08-27 MED ORDER — FUROSEMIDE 40 MG PO TABS: 40.0000 mg | ORAL_TABLET | Freq: Every day | ORAL | 11 refills | Status: DC

## 2018-08-27 MED ORDER — GUAIFENESIN ER 600 MG PO TB12: 600.0000 mg | ORAL_TABLET | Freq: Two times a day (BID) | ORAL | 1 refills | Status: DC

## 2018-08-27 MED ORDER — AZITHROMYCIN 500 MG PO TABS: 500.0000 mg | ORAL_TABLET | Freq: Every day | ORAL | 0 refills | Status: AC

## 2018-08-27 NOTE — Discharge Summary (Signed)
[name], is a 63 y.o. female  DOB 08-Oct-1955  MRN [id number].  Admission date:  08/24/2018  Admitting Physician  Bethena Roys, MD  Discharge Date:  08/27/2018   Primary MD  Jani Gravel, MD  Recommendations for primary care physician for things to follow:   1)Very low-salt diet advised 2)Weigh yourself daily, call if you gain more than 3 pounds in 1 day or more than 5 pounds in 1 week as your diuretic medications may need to be adjusted 3)Limit your Fluid  intake to no more than 60 ounces (1.8 Liters) per day 4)Follow -up with gastroenterologist Dr. Barney Drain at Address: [address] [place], [place] [address] Phone: [phone]---- most likely need upper endoscopy and colonoscopy 5)Avoid ibuprofen/Advil/Aleve/Motrin/Goody Powders/Naproxen/BC powders/Meloxicam/Diclofenac/Indomethacin and other Nonsteroidal anti-inflammatory medications as these will make you more likely to bleed and can cause stomach ulcers, can also cause Kidney problems.  6) you need repeat CBC and BMP blood test around Tuesday, 09/01/2018 7) absolutely no smoking around oxygen--this related to fire, injury and Death   Admission Diagnosis  Hypokalemia [E87.6] Pleural effusion [J90] Hypoxia [R09.02] AKI (acute kidney injury) ([place]) [N17.9] Gastrointestinal hemorrhage, unspecified gastrointestinal hemorrhage type [K92.2] Anemia, unspecified type [D64.9] Depression, unspecified depression type [F32.9] Community acquired pneumonia of right lung, unspecified part of lung [J18.9]   Discharge Diagnosis  Hypokalemia [E87.6] Pleural effusion [J90] Hypoxia [R09.02] AKI (acute kidney injury) ([place]) [N17.9] Gastrointestinal hemorrhage, unspecified gastrointestinal hemorrhage type [K92.2] Anemia, unspecified type [D64.9] Depression, unspecified depression type [F32.9] Community acquired pneumonia of right lung, unspecified part  of lung [J18.9]    Principal Problem:   Acute respiratory failure with hypoxia ([place]) Active Problems:   Anxiety and depression   HTN (hypertension)   COPD exacerbation ([facility])   Hypokalemia   Elevated LFTs   Anemia   Gastrointestinal hemorrhage      Past Medical History:  Diagnosis Date  . Anemia   . Anxiety and depression   . Bronchitis   . Cancer ([place])    uterus  . CHF (congestive heart failure) ([place])   . Chronic pain 11/22/2010  . COPD (chronic obstructive pulmonary disease) ([place])   . Gastroesophageal reflux disease 11/22/2010  . Hyperlipidemia   . Hypertension   . Hypothyroid   . Migraine headache 11/22/2010  . Near syncope   . Peripheral vascular disease ([place])   . Pneumonia   . Tobacco abuse    1/2 pack per day    Past Surgical History:  Procedure Laterality Date  . ABDOMINAL HYSTERECTOMY     without oophorectomy for neoplastic disease  . BACK SURGERY    . CARDIAC CATHETERIZATION  07/2013   normal coronary arteries  . CARPAL TUNNEL RELEASE     Left  . CHOLECYSTECTOMY  2007  . DILATION AND CURETTAGE OF UTERUS    . LAPAROTOMY    . LEFT HEART CATHETERIZATION WITH CORONARY ANGIOGRAM N/A 07/12/2013   Procedure: LEFT HEART CATHETERIZATION WITH CORONARY ANGIOGRAM;  Surgeon: Leonie Man, MD;  Location: [facility] CATH LAB;  Service: Cardiovascular;  Laterality: N/A;  . LUMBAR LAMINECTOMY  x3   Left; complicated by neurologic dysfunction  . NASAL SINUS SURGERY       HPI  from the history and physical done on the day of admission:     Patient coming from: Home  I have personally briefly reviewed patient's old medical records in [place]  Chief Complaint: Weakness  HPI: [name] is a 63 y.o. female with medical history significant for CHF, hypothyroidism, peripheral vascular disease, COPD, depression anxiety, hypertension, subclavian steal syndrome, CVA. At the time of my evaluation patient is alert and oriented, able to give me a history.  Who  presented to the ED with complaints of generalized weakness of 2 weeks, and confusion at night.  Patient reports over the past 2 weeks she has barely eaten or drank anything that she has felt so depressed.  No abdominal pain, no vomiting, she reports some occasional loose stools.  She reports chronic difficulty breathing, but is unable to tell me if this has changed recently.  She denies cough.  No chest pain.  No fever or chills.  She reports bilateral lower extremity swelling of about 2 weeks duration now.  She reports her normal weight is about  115 pounds.  She denies being on any fluid pills.  She denies vomiting of blood, denies black stools or bloody stools.  She denies NSAID use.  She is unaware of a history of heart disease or stroke, though both of these are listed on her medical problems. Patient is supposed to be on CPAP at night and home oxygen, both of which she has not used in at least 4 years.  Per patient spouse-he has been seeing blood in patient's underwear, and patient has been admitted to [facility] for same.  ED Course: Temperature 98.  Respiratory rate 17-24, blood pressure systolic 1 1 7-1 2 9, O2 sats 68% on room air.  Patient was initially lethargic and somewhat confused, this improved after patient was placed on 2 L nasal cannula she became more alert and oriented.  After placing patient on O2 by nasal cannula, patient's been BNP elevated at 1260.  Mild leukocytosis 12.4.  Low potassium 2.6.  Elevated creatinine 2.47.  Elevated bicarb 33.  Hemoglobin 7.9.  EKG without significant change from prior.  Stool occult positive. Portable chest x-ray shows interval cardiomegaly and moderate-sized right pleural effusion with right basilar atelectasis or pneumonia. Patient started on IV ceftriaxone and azithromycin.  Hospitalist to admit for community-acquired pneumonia.     Hospital Course:       A/p Acute respiratory failure with hypoxia-with reported dyspnea,--- most  likely due to pneumonia and CHF, patient continues to require supplemental oxygen, currently requiring 3 L via nasal cannula   HFpEF/dCHF history-decompensated. New 1+ pitting pedal edema bilaterally,marked elevated BNP 1260 ( was 67 fouryears ago)chest x-ray with moderate sized right pleural effusion, no echo on file.  PTA was Not on diuretics. She is unaware if she has gained weight. Denies chest pain. EKG unchanged.  -Diuresed well with Lasix,.  Okay to discharge home on p.o. Lasix -Echocardiogram with LVEF 50-55%  Rt Sided Pleural Effusion--- status post ultrasound-guided thoracentesis with removal of 600 mL of serous fluid, fluid studies pending, postthoracentesis chest x-ray shows significant improvement in right-sided pleural effusion and right lung aeration  Acute kidney injury-creatinine was 2.47, improving, creatinine is down to 1.51, Last available creatinine 4 years ago baseline 0.6 - 0.7. --- --  Generalized weakness-likely multifactorial-overall improved,  Metabolic encephalopathy-likely from hypoxia/hypercapnia due to CPAP non-compliance,  and probably pneumonia.  -Mental status changes have resolved, patient is back to baseline   Acute iron deficiency anemia-hemoglobin is up to 9.6 post transfusion of 2 units of packed cells, Last check 4 years ago baseline 11-12. Stool occult positive.She denies black stools or blood loss, orabdominal pain.Newton Pigg use. NoColonoscopy or EGD on file. Per patient spouse-he has been seeing blood in patient's underwear,and patient has been admitted to [facility] for same. S/p  Feraheme transfusion  - CBC a.m -GI following with possible EGD soon and records being obtained from [facility] --GI consult appreciated and recommend outpatient EGD with Dr. Katharine Look fields  Tobacco abuse/COPD exacerbation/OSA-  -  Hypoxia persist, smoking cessation advised, continue bronchodilators and steroids,  treated with  Rocephin--- Continue CPAP and supplemental oxygen  Anxiety/depression-  reports depressed feelings over the past 2 weeks,which resulted in poor p.o. intake. Denies suicidal or homicidal ideations. -TTS psychiatry consultation appreciated, they recommend outpatient follow-up   Disposition--- patient repeatedly came down to the nursing station, demanding to be discharged home or she would leave AMA. --It took a lot of pleading for patient to agree to right-sided thoracentesis prior to discharge --She wanted to be discharged home As possible refused to stay for further treatment or interventions --She repeatedly threatening to leave AMA so she was reluctantly discharged home with home O2 and home health services  DVT prophylaxis: SCDs Code Status: Full Family Communication: Discussed with Husband by phone--- [phone]   Consultants:   GI/psych/radiology  Procedures:   Right-sided thoracentesis with removal of 600 mL of pleural fluid   Discharge Condition: stable Follow UP--- GI and psychiatry  Diet and Activity recommendation:  As advised  Discharge Instructions    Discharge Instructions    Call MD for:  difficulty breathing, headache or visual disturbances   Complete by: As directed    Call MD for:  persistant dizziness or light-headedness   Complete by: As directed    Call MD for:  persistant nausea and vomiting   Complete by: As directed    Call MD for:  severe uncontrolled pain   Complete by: As directed    Call MD for:  temperature >100.4   Complete by: As directed    Diet - low sodium heart healthy   Complete by: As directed    Discharge instructions   Complete by: As directed    1)Very low-salt diet advised 2)Weigh yourself daily, call if you gain more than 3 pounds in 1 day or more than 5 pounds in 1 week as your diuretic medications may need to be adjusted 3)Limit your Fluid  intake to no more than 60 ounces (1.8 Liters) per day 4)Follow -up with  gastroenterologist Dr. Barney Drain at Address: [address], [place], [place] [address] Phone: [phone]----he most likely need upper endoscopy and colonoscopy 5)Avoid ibuprofen/Advil/Aleve/Motrin/Goody Powders/Naproxen/BC powders/Meloxicam/Diclofenac/Indomethacin and other Nonsteroidal anti-inflammatory medications as these will make you more likely to bleed and can cause stomach ulcers, can also cause Kidney problems.  6) you need repeat CBC and BMP blood test around Tuesday, 09/01/2018 7) absolutely no smoking around oxygen--this related to fire, injury and Death   Increase activity slowly   Complete by: As directed         Discharge Medications     Allergies as of 08/27/2018      Reactions   Acetaminophen-codeine Nausea And Vomiting   Aspirin Nausea And Vomiting   (can  take coated aspirin)   Celecoxib Other (See Comments)   "does the opposite of what it is suppose to do"   Levaquin [levofloxacin In D5w] Itching   Lipitor [atorvastatin] Other (See Comments)   Myalgias    Sulfa Antibiotics Nausea And Vomiting   Adhesive [tape] Rash   Latex Rash      Medication List    STOP taking these medications   aspirin EC 81 MG tablet   oxyCODONE 5 MG immediate release tablet Commonly known as: Oxy IR/ROXICODONE   saccharomyces boulardii 250 MG capsule Commonly known as: FLORASTOR   varenicline 0.5 MG X 11 & 1 MG X 42 tablet Commonly known as: Chantix Starting Month Pak     TAKE these medications   acetaminophen 325 MG tablet Commonly known as: TYLENOL Take 2 tablets (650 mg total) by mouth every 6 (six) hours as needed for mild pain (or Fever >/= 101).   albuterol (2.5 MG/3ML) 0.083% nebulizer solution Commonly known as: PROVENTIL Take 3 mLs (2.5 mg total) by nebulization every 4 (four) hours as needed for wheezing or shortness of breath (cough). What changed:   when to take this  reasons to take this   albuterol 108 (90 Base) MCG/ACT inhaler Commonly known as: ProAir  HFA Inhale 2 puffs into the lungs every 4 (four) hours as needed for wheezing or shortness of breath (cough). What changed:   when to take this  reasons to take this   ALPRAZolam 1 MG tablet Commonly known as: XANAX Take 0.5 tablets (0.5 mg total) by mouth 3 (three) times daily as needed for anxiety.   azithromycin 500 MG tablet Commonly known as: ZITHROMAX Take 1 tablet (500 mg total) by mouth daily for 5 days.   cefdinir 300 MG capsule Commonly known as: OMNICEF Take 1 capsule (300 mg total) by mouth 2 (two) times daily for 5 days.   dicyclomine 10 MG capsule Commonly known as: BENTYL Take 10 mg by mouth 4 (four) times daily.   DULoxetine 60 MG capsule Commonly known as: Cymbalta Take 1 capsule (60 mg total) by mouth 2 (two) times daily.   FLUoxetine 40 MG capsule Commonly known as: PROZAC Take 40 mg by mouth.   furosemide 40 MG tablet Commonly known as: Lasix Take 1 tablet (40 mg total) by mouth daily.   gabapentin 100 MG capsule Commonly known as: NEURONTIN Take 100 mg by mouth.   guaiFENesin 600 MG 12 hr tablet Commonly known as: MUCINEX Take 1 tablet (600 mg total) by mouth 2 (two) times daily.   guaiFENesin 600 MG 12 hr tablet Commonly known as: Mucinex Take 1 tablet (600 mg total) by mouth 2 (two) times daily for 10 days.   levothyroxine 25 MCG tablet Commonly known as: Levothroid Take 1 tablet (25 mcg total) by mouth every morning. Start taking on: August 28, 2018   loperamide 2 MG capsule Commonly known as: IMODIUM Take 2 mg by mouth as needed for diarrhea or loose stools.   pantoprazole 40 MG tablet Commonly known as: PROTONIX Take 1 tablet (40 mg total) by mouth daily.   predniSONE 20 MG tablet Commonly known as: Deltasone Take 2 tablets (40 mg total) by mouth daily with breakfast for 5 days. What changed:   medication strength  how much to take  how to take this  when to take this  additional instructions   VITAMIN E PO Take 1  capsule by mouth daily.  Durable Medical Equipment  (From admission, onward)         Start     Ordered   08/27/18 1348  For home use only DME oxygen  Once    Comments: SATURATION QUALIFICATIONS: (This note is used to comply with regulatory documentation for home oxygen)   Patient Saturations on Room Air at Rest = 80%   Patient Saturations on Room Air while Ambulating = 78 %   Patient Saturations on 3 Liters of oxygen while Ambulating = 93 %     Please briefly explain why patient needs home oxygen: patient quickly desats below 88% on room air at rest and with ambulating.  Patient requires supplement oxygen to maintain O2 sats above 88% while ambulating and at rest     Patient needs continuous O2 at 3 L/min continuously via nasal cannula with humidifier, with gaseous portability and conserving device  Question Answer Comment  Length of Need Lifetime   Mode or (Route) Nasal cannula   Liters per Minute 3   Frequency Continuous (stationary and portable oxygen unit needed)   Oxygen conserving device Yes   Oxygen delivery system Gas      08/27/18 1347   08/27/18 1336  For home use only DME oxygen  Once    Question Answer Comment  Length of Need Lifetime   Mode or (Route) Nasal cannula   Liters per Minute 3   Frequency Continuous (stationary and portable oxygen unit needed)   Oxygen conserving device Yes   Oxygen delivery system Gas      08/27/18 1336   08/27/18 1233  For home use only DME Nebulizer machine  Once    Comments: Dx J44.9  Question Answer Comment  Patient needs a nebulizer to treat with the following condition COPD (chronic obstructive pulmonary disease) ([place])   Length of Need Lifetime      08/27/18 1232   08/27/18 1233  For home use only DME Nebulizer/meds  Once    Comments: Dx J44.9  Question Answer Comment  Patient needs a nebulizer to treat with the following condition COPD (chronic obstructive pulmonary disease) ([place])   Length of Need  Lifetime      08/27/18 1232          Major procedures and Radiology Reports - PLEASE review detailed and final reports for all details, in brief -   Dg Chest 2 View  Result Date: 08/26/2018 CLINICAL DATA:  Shortness of breath. EXAM: CHEST - 2 VIEW COMPARISON:  Chest x-ray dated August 24, 2018. FINDINGS: Stable enlarged cardiopericardial silhouette with slightly globular appearance. Worsened diffuse mild interstitial pulmonary edema. Unchanged moderate right pleural effusion with adjacent right basilar opacity. Trace left pleural effusion. No pneumothorax. No acute osseous abnormality. IMPRESSION: 1. Worsened diffuse mild interstitial pulmonary edema. Unchanged moderate right pleural effusion with adjacent right basilar atelectasis versus infiltrate. 2. Stable enlarged, globular appearance of the cardiopericardial silhouette which could reflect cardiomegaly and/or pericardial effusion. Electronically Signed   By: Titus Dubin M.D.   On: 08/26/2018 14:27   US Abdomen Complete  Result Date: 08/25/2018 CLINICAL DATA:  63 year old presenting with elevated liver function tests and hyperbilirubinemia. Surgical history includes cholecystectomy. EXAM: ABDOMEN ULTRASOUND COMPLETE COMPARISON:  CT abdomen pelvis 06/11/2012. FINDINGS: Gallbladder: Surgically absent. Common bile duct: Diameter: Approximately 7 mm. No obstructing bile duct stones or mass. Unchanged since the prior CT. Liver: Diffusely increased and coarsened echotexture without focal hepatic parenchymal abnormality. Portal vein is patent on color Doppler imaging with normal direction of  blood flow towards the liver. IVC: Patent. Pancreas: While difficult to visualize in its entirety, visualized portions normal in appearance. Spleen: Normal size and echotexture without focal parenchymal abnormality. Right Kidney: Length: Approximately 11.7 cm. No hydronephrosis. Well-preserved cortex. No shadowing calculi. Normal parenchymal echotexture. No focal  parenchymal abnormality. Left Kidney: Length: Approximately 9.9 cm. No hydronephrosis. Well-preserved cortex. No shadowing calculi. Normal parenchymal echotexture. No focal parenchymal abnormality. Abdominal aorta: Normal in caliber throughout its visualized course in the abdomen with evidence of atherosclerosis. Maximum diameter 2.9 cm approximately. Other findings: None. IMPRESSION: 1. Diffuse hepatic steatosis and/or hepatocellular disease without focal hepatic parenchymal abnormality. 2. No unexpected biliary ductal dilation post cholecystectomy. The common bile duct measures 7 mm in diameter and is unchanged from a prior CT in 2014. 3.  Aortic Atherosclerosis (ICD10-170.0) Electronically Signed   By: Evangeline Dakin M.D.   On: 08/25/2018 13:51   Dg Chest Portable 1 View  Result Date: 08/24/2018 CLINICAL DATA:  Shortness of breath. Generalized weakness for the past 2 weeks. Smoker. EXAM: PORTABLE CHEST 1 VIEW COMPARISON:  05/16/2016. FINDINGS: Interval enlarged cardiac silhouette and moderate-sized right pleural effusion with right basilar airspace opacity. Mild diffuse peribronchial thickening with mild progression. Stable old, healed left 6th rib fracture. IMPRESSION: 1. Interval cardiomegaly and moderate-sized right pleural effusion with right basilar atelectasis or pneumonia. 2. Mild bronchitic changes with mild progression. Electronically Signed   By: Claudie Revering M.D.   On: 08/24/2018 14:28    Micro Results    Recent Results (from the past 240 hour(s))  SARS Coronavirus 2 (CEPHEID - Performed in [facility] hospital lab), Hosp Order     Status: None   Collection Time: 08/24/18  2:09 PM   Specimen: Nasopharyngeal Swab  Result Value Ref Range Status   SARS Coronavirus 2 NEGATIVE NEGATIVE Final    Comment: (NOTE) If result is NEGATIVE SARS-CoV-2 target nucleic acids are NOT DETECTED. The SARS-CoV-2 RNA is generally detectable in upper and lower  respiratory specimens during the acute  phase of infection. The lowest  concentration of SARS-CoV-2 viral copies this assay can detect is 250  copies / mL. A negative result does not preclude SARS-CoV-2 infection  and should not be used as the sole basis for treatment or other  patient management decisions.  A negative result may occur with  improper specimen collection / handling, submission of specimen other  than nasopharyngeal swab, presence of viral mutation(s) within the  areas targeted by this assay, and inadequate number of viral copies  (<250 copies / mL). A negative result must be combined with clinical  observations, patient history, and epidemiological information. If result is POSITIVE SARS-CoV-2 target nucleic acids are DETECTED. The SARS-CoV-2 RNA is generally detectable in upper and lower  respiratory specimens dur ing the acute phase of infection.  Positive  results are indicative of active infection with SARS-CoV-2.  Clinical  correlation with patient history and other diagnostic information is  necessary to determine patient infection status.  Positive results do  not rule out bacterial infection or co-infection with other viruses. If result is PRESUMPTIVE POSTIVE SARS-CoV-2 nucleic acids MAY BE PRESENT.   A presumptive positive result was obtained on the submitted specimen  and confirmed on repeat testing.  While 2019 novel coronavirus  (SARS-CoV-2) nucleic acids may be present in the submitted sample  additional confirmatory testing may be necessary for epidemiological  and / or clinical management purposes  to differentiate between  SARS-CoV-2 and other Sarbecovirus currently known to infect  humans.  If clinically indicated additional testing with an alternate test  methodology [id number]) is advised. The SARS-CoV-2 RNA is generally  detectable in upper and lower respiratory sp ecimens during the acute  phase of infection. The expected result is Negative. Fact Sheet for Patients:   StrictlyIdeas.no Fact Sheet for Healthcare Providers: BankingDealers.co.za This test is not yet approved or cleared by the [place] FDA and has been authorized for detection and/or diagnosis of SARS-CoV-2 by FDA under an Emergency Use Authorization (EUA).  This EUA will remain in effect (meaning this test can be used) for the duration of the COVID-19 declaration under Section 564(b)(1) of the Act, 21 U.S.C. section 360bbb-3(b)(1), unless the authorization is terminated or revoked sooner. Performed at [facility], [address] [place], [place] [address]        Today   Subjective    [name] today has no new complaints  , hypoxia persist        Patient has been seen and examined prior to discharge  --patient repeatedly came down to the nursing station, demanding to be discharged home or she would leave AMA. --It took a lot of pleading for patient to agree to right-sided thoracentesis prior to discharge --She wanted to be discharged home As possible refused to stay for further treatment or interventions --She repeatedly threatening to leave AMA so she was reluctantly discharged home with home O2 and home health services   Objective   Blood pressure 128/77, pulse 82, temperature 98.6 F (37 C), resp. rate 18, weight 68.6 kg, SpO2 93 %.   Intake/Output Summary (Last 24 hours) at 08/27/2018 1541 Last data filed at 08/27/2018 1100 Gross per 24 hour  Intake 1200 ml  Output 850 ml  Net 350 ml    Exam Gen:- Awake Alert, no acute distress HEENT:- [place].AT, No sclera icterus Nose- [place] 3 L/min Neck-Supple Neck,No JVD,.  Lungs-improved air movement on the right, no wheezing CV- S1, S2 normal, regular Abd-  +ve B.Sounds, Abd Soft, No tenderness,    Extremity/Skin:- No  edema,   good pulses Psych-affect is appropriate, oriented x3 Neuro-no new focal deficits, no tremors    Data Review   CBC w Diff:  Lab Results   Component Value Date   WBC 12.1 (H) 08/27/2018   HGB 9.6 (L) 08/27/2018   HCT 34.3 (L) 08/27/2018   HCT 32.5 (L) 08/26/2018   PLT 179 08/27/2018   LYMPHOPCT 10 08/24/2018   MONOPCT 7 08/24/2018   EOSPCT 0 08/24/2018   BASOPCT 0 08/24/2018    CMP:  Lab Results  Component Value Date   NA 140 08/27/2018   K 3.4 (L) 08/27/2018   CL 93 (L) 08/27/2018   CO2 35 (H) 08/27/2018   BUN 32 (H) 08/27/2018   CREATININE 1.51 (H) 08/27/2018   PROT 5.7 (L) 08/27/2018   ALBUMIN 3.0 (L) 08/27/2018   BILITOT 0.9 08/27/2018   ALKPHOS 113 08/27/2018   AST 29 08/27/2018   ALT 60 (H) 08/27/2018  .   Total Discharge time is about 33 minutes  Roxan Hockey M.D on 08/27/2018 at 3:41 PM  Go to [email or web address] -  for contact info  [facility] - Office  [phone]

## 2018-08-27 NOTE — Discharge Instructions (Signed)
1)Very low-salt diet advised 2)Weigh yourself daily, call if you gain more than 3 pounds in 1 day or more than 5 pounds in 1 week as your diuretic medications may need to be adjusted 3)Limit your Fluid  intake to no more than 60 ounces (1.8 Liters) per day 4)Follow -up with gastroenterologist Dr. Barney Drain at Address: [address], [place], [place] [address] Phone: [phone]----he most likely need upper endoscopy and colonoscopy 5)Avoid ibuprofen/Advil/Aleve/Motrin/Goody Powders/Naproxen/BC powders/Meloxicam/Diclofenac/Indomethacin and other Nonsteroidal anti-inflammatory medications as these will make you more likely to bleed and can cause stomach ulcers, can also cause Kidney problems.  6) you need repeat CBC and BMP blood test around Tuesday, 09/01/2018 7) absolutely no smoking around oxygen--this related to fire, injury and Death

## 2018-08-27 NOTE — Progress Notes (Signed)
     SATURATION QUALIFICATIONS: (This note is used to comply with regulatory documentation for home oxygen)   Patient Saturations on Room Air at Rest = 80%   Patient Saturations on Room Air while Ambulating = 78 %   Patient Saturations on 3 Liters of oxygen while Ambulating = 93 %     Please briefly explain why patient needs home oxygen: patient quickly desats below 88% on room air at rest and with ambulating.  Patient requires supplement oxygen to maintain O2 sats above 88% while ambulating and at rest     Patient needs continuous O2 at 3 L/min continuously via nasal cannula with humidifier, with gaseous portability and conserving device

## 2018-08-27 NOTE — TOC Transition Note (Addendum)
Transition of Care [facility]) - CM/SW Discharge Note   Patient Details  Name: [name] MRN: [id number] Date of Birth: [birth year]  Transition of Care [facility]) CM/SW Contact:  Mack Alvidrez, Chauncey [place], RN Phone Number: 08/27/2018, 1:19 PM   Clinical Narrative:    Patient qualifies for home oxygen. Discussed providers. She elects [employer]. Nebulizer machine ordered, patient reports she has a nebulizer machine at home. Juliann Pulse of [place] notified. [place] of [facility] notified of DC.   ADDENDUM: Per MD, husband reports that neb machine is broken. Call to discuss with patient again, she says machine is not broken, she uses it regularly. CM has requested home health orders.      Barriers to Discharge: Continued Medical Work up   Patient Goals and CMS Choice Patient states their goals for this hospitalization and ongoing recovery are:: feel better and go home CMS Medicare.gov Compare Post Acute Care list provided to:: Patient Choice offered to / list presented to : Patient   Discharge Plan and Services In-house Referral: Clinical Social Work   Post Acute Care Choice: Home Health          DME Arranged: Oxygen DME Agency: [employer] Date DME Agency Contacted: 08/27/18 Time DME Agency Contacted: [id number] Representative spoke with at DME Agency: Juliann Pulse of [place]: RN, Social Work [employer] Agency: [place] ([place]) Date [place]: 08/26/18 Time [place]: [id number] Representative spoke with at [place]: [place] ([place]) Interventions     Readmission Risk Interventions Readmission Risk Prevention Plan 08/26/2018  Transportation Screening Complete  Home Care Screening Complete  Medication Review (RN CM) Complete  Some recent data might be hidden

## 2018-08-27 NOTE — Telephone Encounter (Signed)
Will call patient to schedule once she is d/c'd. Will schedule at NA date in October and place patient on the cancellation list. FYI to LSL and SLF

## 2018-08-27 NOTE — Progress Notes (Signed)
Pt found on room air with O2 sats at 65%, pt placed back on nasal cannula at 4L O2 sats increased to 93%. RT will continue to monitor.

## 2018-08-27 NOTE — Progress Notes (Signed)
IV removed, patient tolerated well. Reviewed AVS with patient who verbalized understanding.  Patient to be transported home by her husband and taken to short stay lobby via wheeelchair.

## 2018-08-27 NOTE — Progress Notes (Signed)
SATURATION QUALIFICATIONS: (This note is used to comply with regulatory documentation for home oxygen)  Patient Saturations on Room Air at Rest = 80%  Patient Saturations on Room Air while Ambulating = 78%  Patient Saturations on 3 Liters of oxygen while Ambulating = 92-93%  Please briefly explain why patient needs home oxygen: patient quickly desats below 88% on room air at rest and with ambulating.  Patient requires supplement oxygen to maintain O2 sats above 88% while ambulating and at rest.

## 2018-08-27 NOTE — Telephone Encounter (Signed)
Patient currently inpatient.   Per Dr. Oneida Alar,   Needs EGD with MAC in 4-6 weeks for melena, anemia, heme + stool. Does not need OV prior to EGD.  Nees OV in 4 months for hospital follow up, anemia, abnormal LFTs, brbpr

## 2018-08-28 ENCOUNTER — Other Ambulatory Visit: Payer: Self-pay | Admitting: *Deleted

## 2018-08-28 DIAGNOSIS — Z9049 Acquired absence of other specified parts of digestive tract: Secondary | ICD-10-CM | POA: Diagnosis not present

## 2018-08-28 DIAGNOSIS — D649 Anemia, unspecified: Secondary | ICD-10-CM | POA: Diagnosis not present

## 2018-08-28 DIAGNOSIS — I739 Peripheral vascular disease, unspecified: Secondary | ICD-10-CM | POA: Diagnosis not present

## 2018-08-28 DIAGNOSIS — J440 Chronic obstructive pulmonary disease with acute lower respiratory infection: Secondary | ICD-10-CM | POA: Diagnosis not present

## 2018-08-28 DIAGNOSIS — Z79899 Other long term (current) drug therapy: Secondary | ICD-10-CM | POA: Diagnosis not present

## 2018-08-28 DIAGNOSIS — I110 Hypertensive heart disease with heart failure: Secondary | ICD-10-CM | POA: Diagnosis not present

## 2018-08-28 DIAGNOSIS — Z7951 Long term (current) use of inhaled steroids: Secondary | ICD-10-CM | POA: Diagnosis not present

## 2018-08-28 DIAGNOSIS — I509 Heart failure, unspecified: Secondary | ICD-10-CM | POA: Diagnosis not present

## 2018-08-28 DIAGNOSIS — G8929 Other chronic pain: Secondary | ICD-10-CM | POA: Diagnosis not present

## 2018-08-28 DIAGNOSIS — G458 Other transient cerebral ischemic attacks and related syndromes: Secondary | ICD-10-CM | POA: Diagnosis not present

## 2018-08-28 DIAGNOSIS — J441 Chronic obstructive pulmonary disease with (acute) exacerbation: Secondary | ICD-10-CM | POA: Diagnosis not present

## 2018-08-28 DIAGNOSIS — K219 Gastro-esophageal reflux disease without esophagitis: Secondary | ICD-10-CM | POA: Diagnosis not present

## 2018-08-28 DIAGNOSIS — G43909 Migraine, unspecified, not intractable, without status migrainosus: Secondary | ICD-10-CM | POA: Diagnosis not present

## 2018-08-28 DIAGNOSIS — I700 Atherosclerosis of aorta: Secondary | ICD-10-CM | POA: Diagnosis not present

## 2018-08-28 DIAGNOSIS — J9601 Acute respiratory failure with hypoxia: Secondary | ICD-10-CM | POA: Diagnosis not present

## 2018-08-28 DIAGNOSIS — E785 Hyperlipidemia, unspecified: Secondary | ICD-10-CM | POA: Diagnosis not present

## 2018-08-28 DIAGNOSIS — E039 Hypothyroidism, unspecified: Secondary | ICD-10-CM | POA: Diagnosis not present

## 2018-08-28 DIAGNOSIS — J189 Pneumonia, unspecified organism: Secondary | ICD-10-CM | POA: Diagnosis not present

## 2018-08-28 LAB — PH, BODY FLUID: pH, Body Fluid: 7.4

## 2018-08-28 NOTE — Patient Outreach (Signed)
Referral received from LCSW inpatient- (for severe depression), pt hospitalized 7/20-7/23/20 with hypoxemic respiratory failure secondary to pleural effusion/ pneumonia, anemia (Hgb 7.9) related to GI bleeding, hypokalemia, severe depression, pt with history COPD, CHF, HTN, eating disorder, noted pt demanded to be discharged home or would leave AMA, pt refused to stay for further treatment, interventions. Outreach call to pt for screening, Primary care provider completes transition of care, no answer to telephone and no option to leave voicemail.  RN CM mailed unsuccessful outreach letter to pt home.  PLAN Outreach pt in 3-4 business days  Jacqlyn Larsen [facility], [place] [phone]

## 2018-08-28 NOTE — Telephone Encounter (Signed)
PT WILL NEED OPV IN 4-6 WEEKS, Dx: ANEMIA.

## 2018-08-29 DIAGNOSIS — I739 Peripheral vascular disease, unspecified: Secondary | ICD-10-CM | POA: Diagnosis not present

## 2018-08-29 DIAGNOSIS — J9601 Acute respiratory failure with hypoxia: Secondary | ICD-10-CM | POA: Diagnosis not present

## 2018-08-29 DIAGNOSIS — I110 Hypertensive heart disease with heart failure: Secondary | ICD-10-CM | POA: Diagnosis not present

## 2018-08-29 DIAGNOSIS — E785 Hyperlipidemia, unspecified: Secondary | ICD-10-CM | POA: Diagnosis not present

## 2018-08-29 DIAGNOSIS — J441 Chronic obstructive pulmonary disease with (acute) exacerbation: Secondary | ICD-10-CM | POA: Diagnosis not present

## 2018-08-29 DIAGNOSIS — J440 Chronic obstructive pulmonary disease with acute lower respiratory infection: Secondary | ICD-10-CM | POA: Diagnosis not present

## 2018-08-29 DIAGNOSIS — D649 Anemia, unspecified: Secondary | ICD-10-CM | POA: Diagnosis not present

## 2018-08-29 DIAGNOSIS — Z7951 Long term (current) use of inhaled steroids: Secondary | ICD-10-CM | POA: Diagnosis not present

## 2018-08-29 DIAGNOSIS — E039 Hypothyroidism, unspecified: Secondary | ICD-10-CM | POA: Diagnosis not present

## 2018-08-29 DIAGNOSIS — Z79899 Other long term (current) drug therapy: Secondary | ICD-10-CM | POA: Diagnosis not present

## 2018-08-29 DIAGNOSIS — G8929 Other chronic pain: Secondary | ICD-10-CM | POA: Diagnosis not present

## 2018-08-29 DIAGNOSIS — I509 Heart failure, unspecified: Secondary | ICD-10-CM | POA: Diagnosis not present

## 2018-08-29 DIAGNOSIS — K219 Gastro-esophageal reflux disease without esophagitis: Secondary | ICD-10-CM | POA: Diagnosis not present

## 2018-08-29 DIAGNOSIS — G458 Other transient cerebral ischemic attacks and related syndromes: Secondary | ICD-10-CM | POA: Diagnosis not present

## 2018-08-29 DIAGNOSIS — Z9049 Acquired absence of other specified parts of digestive tract: Secondary | ICD-10-CM | POA: Diagnosis not present

## 2018-08-29 DIAGNOSIS — G43909 Migraine, unspecified, not intractable, without status migrainosus: Secondary | ICD-10-CM | POA: Diagnosis not present

## 2018-08-29 DIAGNOSIS — J189 Pneumonia, unspecified organism: Secondary | ICD-10-CM | POA: Diagnosis not present

## 2018-08-29 DIAGNOSIS — I700 Atherosclerosis of aorta: Secondary | ICD-10-CM | POA: Diagnosis not present

## 2018-08-31 ENCOUNTER — Encounter: Payer: Self-pay | Admitting: Gastroenterology

## 2018-08-31 DIAGNOSIS — G458 Other transient cerebral ischemic attacks and related syndromes: Secondary | ICD-10-CM | POA: Diagnosis not present

## 2018-08-31 DIAGNOSIS — D649 Anemia, unspecified: Secondary | ICD-10-CM | POA: Diagnosis not present

## 2018-08-31 DIAGNOSIS — G8929 Other chronic pain: Secondary | ICD-10-CM | POA: Diagnosis not present

## 2018-08-31 DIAGNOSIS — I509 Heart failure, unspecified: Secondary | ICD-10-CM | POA: Diagnosis not present

## 2018-08-31 DIAGNOSIS — K219 Gastro-esophageal reflux disease without esophagitis: Secondary | ICD-10-CM | POA: Diagnosis not present

## 2018-08-31 DIAGNOSIS — J440 Chronic obstructive pulmonary disease with acute lower respiratory infection: Secondary | ICD-10-CM | POA: Diagnosis not present

## 2018-08-31 DIAGNOSIS — I739 Peripheral vascular disease, unspecified: Secondary | ICD-10-CM | POA: Diagnosis not present

## 2018-08-31 DIAGNOSIS — E785 Hyperlipidemia, unspecified: Secondary | ICD-10-CM | POA: Diagnosis not present

## 2018-08-31 DIAGNOSIS — J441 Chronic obstructive pulmonary disease with (acute) exacerbation: Secondary | ICD-10-CM | POA: Diagnosis not present

## 2018-08-31 DIAGNOSIS — G43909 Migraine, unspecified, not intractable, without status migrainosus: Secondary | ICD-10-CM | POA: Diagnosis not present

## 2018-08-31 DIAGNOSIS — J189 Pneumonia, unspecified organism: Secondary | ICD-10-CM | POA: Diagnosis not present

## 2018-08-31 DIAGNOSIS — Z7951 Long term (current) use of inhaled steroids: Secondary | ICD-10-CM | POA: Diagnosis not present

## 2018-08-31 DIAGNOSIS — Z79899 Other long term (current) drug therapy: Secondary | ICD-10-CM | POA: Diagnosis not present

## 2018-08-31 DIAGNOSIS — J9601 Acute respiratory failure with hypoxia: Secondary | ICD-10-CM | POA: Diagnosis not present

## 2018-08-31 DIAGNOSIS — Z9049 Acquired absence of other specified parts of digestive tract: Secondary | ICD-10-CM | POA: Diagnosis not present

## 2018-08-31 DIAGNOSIS — E039 Hypothyroidism, unspecified: Secondary | ICD-10-CM | POA: Diagnosis not present

## 2018-08-31 DIAGNOSIS — I110 Hypertensive heart disease with heart failure: Secondary | ICD-10-CM | POA: Diagnosis not present

## 2018-08-31 DIAGNOSIS — I700 Atherosclerosis of aorta: Secondary | ICD-10-CM | POA: Diagnosis not present

## 2018-08-31 NOTE — Telephone Encounter (Signed)
LMOVM for pt 

## 2018-08-31 NOTE — Telephone Encounter (Signed)
PATIENT SCHEDULED AND LETTER SENT  °

## 2018-09-01 LAB — CULTURE, BODY FLUID W GRAM STAIN -BOTTLE: Culture: NO GROWTH

## 2018-09-01 NOTE — Telephone Encounter (Signed)
I am holding date 9/17 at 8:30am for procedure when patient calls back

## 2018-09-01 NOTE — Telephone Encounter (Signed)
Called # in chart. Received spouse. He provided me with # to reach patient at [phone]. Called # provided and [facility]

## 2018-09-02 NOTE — Telephone Encounter (Signed)
noted 

## 2018-09-02 NOTE — Telephone Encounter (Addendum)
LMOVM for pt x3. No return calls. Letter mailed to patient. Patient scheduled for OV here 9/2. FYI to SLF and LSL.

## 2018-09-02 NOTE — Telephone Encounter (Signed)
REVIEWED-NO ADDITIONAL RECOMMENDATIONS. 

## 2018-09-03 ENCOUNTER — Other Ambulatory Visit: Payer: Self-pay | Admitting: *Deleted

## 2018-09-03 DIAGNOSIS — Z79891 Long term (current) use of opiate analgesic: Secondary | ICD-10-CM | POA: Diagnosis not present

## 2018-09-03 DIAGNOSIS — G4700 Insomnia, unspecified: Secondary | ICD-10-CM | POA: Diagnosis not present

## 2018-09-03 DIAGNOSIS — I5022 Chronic systolic (congestive) heart failure: Secondary | ICD-10-CM | POA: Diagnosis not present

## 2018-09-03 NOTE — Patient Outreach (Signed)
Outreach call to pt for screening/  2nd attempt, unidentified female answered the phone and states "she's asleep and can't talk".  Requests a call back another day.  PLAN Outreach pt in 3-4 business days  Jacqlyn Larsen [facility], [place] [phone]

## 2018-09-08 ENCOUNTER — Other Ambulatory Visit: Payer: Self-pay | Admitting: *Deleted

## 2018-09-08 NOTE — Patient Outreach (Signed)
Outreach call to pt for screening/ 3rd attempt, no answer to telephone and no option to leave voicemail.  PLAN Close case in 3-4 business days  Julie Farmer RNC, BSN [facility] Community Care Coordinator [phone]   

## 2018-09-10 DIAGNOSIS — G458 Other transient cerebral ischemic attacks and related syndromes: Secondary | ICD-10-CM | POA: Diagnosis not present

## 2018-09-10 DIAGNOSIS — J441 Chronic obstructive pulmonary disease with (acute) exacerbation: Secondary | ICD-10-CM | POA: Diagnosis not present

## 2018-09-10 DIAGNOSIS — J189 Pneumonia, unspecified organism: Secondary | ICD-10-CM | POA: Diagnosis not present

## 2018-09-10 DIAGNOSIS — Z9049 Acquired absence of other specified parts of digestive tract: Secondary | ICD-10-CM | POA: Diagnosis not present

## 2018-09-10 DIAGNOSIS — J9601 Acute respiratory failure with hypoxia: Secondary | ICD-10-CM | POA: Diagnosis not present

## 2018-09-10 DIAGNOSIS — Z7951 Long term (current) use of inhaled steroids: Secondary | ICD-10-CM | POA: Diagnosis not present

## 2018-09-10 DIAGNOSIS — E039 Hypothyroidism, unspecified: Secondary | ICD-10-CM | POA: Diagnosis not present

## 2018-09-10 DIAGNOSIS — I509 Heart failure, unspecified: Secondary | ICD-10-CM | POA: Diagnosis not present

## 2018-09-10 DIAGNOSIS — K219 Gastro-esophageal reflux disease without esophagitis: Secondary | ICD-10-CM | POA: Diagnosis not present

## 2018-09-10 DIAGNOSIS — I739 Peripheral vascular disease, unspecified: Secondary | ICD-10-CM | POA: Diagnosis not present

## 2018-09-10 DIAGNOSIS — G8929 Other chronic pain: Secondary | ICD-10-CM | POA: Diagnosis not present

## 2018-09-10 DIAGNOSIS — J440 Chronic obstructive pulmonary disease with acute lower respiratory infection: Secondary | ICD-10-CM | POA: Diagnosis not present

## 2018-09-10 DIAGNOSIS — G43909 Migraine, unspecified, not intractable, without status migrainosus: Secondary | ICD-10-CM | POA: Diagnosis not present

## 2018-09-10 DIAGNOSIS — Z79899 Other long term (current) drug therapy: Secondary | ICD-10-CM | POA: Diagnosis not present

## 2018-09-10 DIAGNOSIS — D649 Anemia, unspecified: Secondary | ICD-10-CM | POA: Diagnosis not present

## 2018-09-10 DIAGNOSIS — I110 Hypertensive heart disease with heart failure: Secondary | ICD-10-CM | POA: Diagnosis not present

## 2018-09-10 DIAGNOSIS — E785 Hyperlipidemia, unspecified: Secondary | ICD-10-CM | POA: Diagnosis not present

## 2018-09-10 DIAGNOSIS — I700 Atherosclerosis of aorta: Secondary | ICD-10-CM | POA: Diagnosis not present

## 2018-09-14 ENCOUNTER — Other Ambulatory Visit: Payer: Self-pay | Admitting: *Deleted

## 2018-09-14 NOTE — Patient Outreach (Signed)
Case closed due to unable to contact pt.  Jacqlyn Larsen [facility], [place] Coordinator [phone]

## 2018-09-17 ENCOUNTER — Encounter: Payer: Self-pay | Admitting: Internal Medicine

## 2018-09-17 ENCOUNTER — Other Ambulatory Visit: Payer: Self-pay

## 2018-09-17 ENCOUNTER — Ambulatory Visit (INDEPENDENT_AMBULATORY_CARE_PROVIDER_SITE_OTHER): Payer: Medicare Other | Admitting: Internal Medicine

## 2018-09-17 VITALS — BP 120/80 | HR 88 | Temp 98.7°F | Ht 62.0 in | Wt 117.1 lb

## 2018-09-17 DIAGNOSIS — F329 Major depressive disorder, single episode, unspecified: Secondary | ICD-10-CM | POA: Diagnosis not present

## 2018-09-17 DIAGNOSIS — Z720 Tobacco use: Secondary | ICD-10-CM

## 2018-09-17 DIAGNOSIS — J189 Pneumonia, unspecified organism: Secondary | ICD-10-CM

## 2018-09-17 DIAGNOSIS — Z1211 Encounter for screening for malignant neoplasm of colon: Secondary | ICD-10-CM | POA: Diagnosis not present

## 2018-09-17 DIAGNOSIS — J441 Chronic obstructive pulmonary disease with (acute) exacerbation: Secondary | ICD-10-CM

## 2018-09-17 DIAGNOSIS — G8929 Other chronic pain: Secondary | ICD-10-CM | POA: Diagnosis not present

## 2018-09-17 DIAGNOSIS — K219 Gastro-esophageal reflux disease without esophagitis: Secondary | ICD-10-CM

## 2018-09-17 DIAGNOSIS — J9601 Acute respiratory failure with hypoxia: Secondary | ICD-10-CM

## 2018-09-17 DIAGNOSIS — F419 Anxiety disorder, unspecified: Secondary | ICD-10-CM

## 2018-09-17 DIAGNOSIS — E785 Hyperlipidemia, unspecified: Secondary | ICD-10-CM

## 2018-09-17 DIAGNOSIS — F32A Depression, unspecified: Secondary | ICD-10-CM

## 2018-09-17 DIAGNOSIS — K921 Melena: Secondary | ICD-10-CM

## 2018-09-17 DIAGNOSIS — E039 Hypothyroidism, unspecified: Secondary | ICD-10-CM

## 2018-09-17 DIAGNOSIS — G894 Chronic pain syndrome: Secondary | ICD-10-CM

## 2018-09-17 NOTE — Progress Notes (Signed)
New Patient Office Visit     CC/Reason for Visit: Establish care, hospital follow up Previous PCP: Jani Gravel, MD Last Visit: Unknown  HPI: [name] is a 63 y.o. female who is coming in today for the above mentioned reasons. Patient is here with her husband today. She is not a very good historian. Does not know what most of her medications are for. Past Medical History is complex, and to the best of my knowledge significant for:   1. Hypothyroidism on 100 mcg synthroid.  2. HLD on Crestor 40 mg.  3. Depression and anxiety. She is on many psychotropic medications:Mirtazapine 45 mg at bedtime, Floxetine 20 mg daily, Cymbalta 60 mg daily, alprazolam 0.5 mg TID. She is also on gabapentin (?reason).  4. Chronic pain syndrome. She states she has seen Dr. Ellene Route, has been told "she has to live with the pain". Has been prescribed oxycodone 5 mg TID for "years". Maybe gabapentin is for this?  5. GERD on Protonix 40 mg QD.  6. Tobacco Abuse, not interested in cessation (has been smoking despite being on oxygen since hospital DC).  7. ?Asthma/COPD. Has never seen a pulmonologist.  She was hospitalized from 7/20-7/23 at [facility]. She had a complex hospitalization with acute hypoxemic respiratory failure thought due to acute diastolic heart failure and possibly PNA. She also had a large right pleural effusion and is s/p a thoracentesis. She had ARF. She was noted to have blood in ger stool. She threatened to leave AMA, so she was reluctantly discharged, altho she was not thought to be ready. She decided not to finish her course of abx because she had some diarrhea. She has not followed up with GI as recommended. She was discharged on lasix 10 mg and states her swelling has significantly decreased. She does not remember much of her hospital stay. It is mentioned in the DC summary that she had acute metabolic encephalopathy. Husband states she has been very sleepy. She has been wearing  oxygen 24/7 since hospital DC.  She wants to get "off some medications". Refuses to consider weaning oxycodone or alprazolam. Discharging hospitalist recommended BMET to follow renal function, but she refuses lab draw today.   Past Medical/Surgical History: Past Medical History:  Diagnosis Date  . Anemia   . Anxiety and depression   . Bronchitis   . Cancer ([place])    uterus  . CHF (congestive heart failure) ([place])   . Chronic pain 11/22/2010  . COPD (chronic obstructive pulmonary disease) ([place])   . Gastroesophageal reflux disease 11/22/2010  . Hyperlipidemia   . Hypertension   . Hypothyroid   . Migraine headache 11/22/2010  . Near syncope   . Peripheral vascular disease ([place])   . Pneumonia   . Tobacco abuse    1/2 pack per day    Past Surgical History:  Procedure Laterality Date  . ABDOMINAL HYSTERECTOMY     without oophorectomy for neoplastic disease  . BACK SURGERY    . CARDIAC CATHETERIZATION  07/2013   normal coronary arteries  . CARPAL TUNNEL RELEASE     Left  . CHOLECYSTECTOMY  2007  . DILATION AND CURETTAGE OF UTERUS    . LAPAROTOMY    . LEFT HEART CATHETERIZATION WITH CORONARY ANGIOGRAM N/A 07/12/2013   Procedure: LEFT HEART CATHETERIZATION WITH CORONARY ANGIOGRAM;  Surgeon: Leonie Man, MD;  Location: [facility] CATH LAB;  Service: Cardiovascular;  Laterality: N/A;  . LUMBAR LAMINECTOMY  x3   Left; complicated by  neurologic dysfunction  . NASAL SINUS SURGERY      Social History:  reports that she has been smoking cigarettes. She started smoking about 46 years ago. She has a 20.00 pack-year smoking history. She has never used smokeless tobacco. She reports that she does not drink alcohol or use drugs.  Allergies: Allergies  Allergen Reactions  . Acetaminophen-Codeine Nausea And Vomiting  . Aspirin Nausea And Vomiting    (can take coated aspirin)  . Celecoxib Other (See Comments)    "does the opposite of what it is suppose to do"  . Levaquin [Levofloxacin In  D5w] Itching  . Lipitor [Atorvastatin] Other (See Comments)    Myalgias   . Sulfa Antibiotics Nausea And Vomiting  . Adhesive [Tape] Rash  . Latex Rash    Family History:  Family History  Problem Relation Age of Onset  . Colon polyps Mother   . Deep vein thrombosis Mother   . Heart disease Mother   . Hypertension Mother   . Bleeding Disorder Mother   . Hypertension Sister   . Heart disease Brother        before age 58  . Hypertension Brother      Current Outpatient Medications:  .  albuterol (PROAIR HFA) 108 (90 Base) MCG/ACT inhaler, Inhale 2 puffs into the lungs every 4 (four) hours as needed for wheezing or shortness of breath (cough)., Disp: 18 g, Rfl: 1 .  albuterol (PROVENTIL) (2.5 MG/3ML) 0.083% nebulizer solution, Take 3 mLs (2.5 mg total) by nebulization every 4 (four) hours as needed for wheezing or shortness of breath (cough)., Disp: 75 mL, Rfl: 12 .  ALPRAZolam (XANAX) 1 MG tablet, Take 0.5 tablets (0.5 mg total) by mouth 3 (three) times daily as needed for anxiety., Disp: 1 tablet, Rfl: 0 .  aspirin EC 81 MG tablet, Take 81 mg by mouth daily., Disp: , Rfl:  .  DULoxetine (CYMBALTA) 60 MG capsule, Take 1 capsule (60 mg total) by mouth 2 (two) times daily., Disp: 60 capsule, Rfl: 3 .  FLUoxetine (PROZAC) 40 MG capsule, Take 40 mg by mouth. , Disp: , Rfl:  .  fluticasone (FLONASE) 50 MCG/ACT nasal spray, Place into both nostrils daily., Disp: , Rfl:  .  furosemide (LASIX) 40 MG tablet, Take 1 tablet (40 mg total) by mouth daily., Disp: 30 tablet, Rfl: 11 .  gabapentin (NEURONTIN) 100 MG capsule, Take 100 mg by mouth. , Disp: , Rfl:  .  levothyroxine (LEVOTHROID) 25 MCG tablet, Take 1 tablet (25 mcg total) by mouth every morning., Disp: 30 tablet, Rfl: 3 .  mirtazapine (REMERON) 45 MG tablet, Take 45 mg by mouth at bedtime., Disp: , Rfl:  .  ondansetron (ZOFRAN) 4 MG tablet, Take 4 mg by mouth every 8 (eight) hours as needed for nausea or vomiting., Disp: , Rfl:  .   oxycodone (OXY-IR) 5 MG capsule, Take 5 mg by mouth 3 (three) times daily., Disp: , Rfl:  .  pantoprazole (PROTONIX) 40 MG tablet, Take 1 tablet (40 mg total) by mouth daily., Disp: 30 tablet, Rfl: 3 .  rosuvastatin (CRESTOR) 40 MG tablet, Take 40 mg by mouth daily., Disp: , Rfl:  .  VITAMIN E PO, Take 1 capsule by mouth daily., Disp: , Rfl:   Review of Systems:  Constitutional: Denies fever, chills, diaphoresis, appetite change and fatigue.  HEENT: Denies photophobia, eye pain, redness, hearing loss, ear pain, congestion, sore throat, rhinorrhea, sneezing, mouth sores, trouble swallowing, neck pain, neck stiffness and tinnitus.  Respiratory: Denies SOB, DOE, cough, chest tightness,  and wheezing.   Cardiovascular: Denies chest pain, palpitations and leg swelling.  Gastrointestinal: Denies nausea, vomiting, abdominal pain, diarrhea, constipation, blood in stool and abdominal distention.  Genitourinary: Denies dysuria, urgency, frequency, hematuria, flank pain and difficulty urinating.  Endocrine: Denies: hot or cold intolerance, sweats, changes in hair or nails, polyuria, polydipsia. Musculoskeletal: Denies myalgias, back pain, joint swelling, arthralgias and gait problem.  Skin: Denies pallor, rash and wound.  Neurological: Denies dizziness, seizures, syncope, weakness, light-headedness, numbness and headaches.  Hematological: Denies adenopathy. Easy bruising, personal or family bleeding history  Psychiatric/Behavioral: Denies suicidal ideation, mood changes, confusion, nervousness, sleep disturbance and agitation    Physical Exam: Vitals:   09/17/18 1308 09/17/18 1349 09/17/18 1352  BP: 120/80    Pulse: 88    Temp: 98.7 F (37.1 C)    TempSrc: Temporal    SpO2: 95% 91% (!) 86%  Weight: 117 lb 1.6 oz (53.1 kg)    Height: 5\' 2"  (1.575 m)     Body mass index is 21.42 kg/m.  Constitutional: NAD, calm, comfortable Eyes: PERRL, lids and conjunctivae normal ENMT: Mucous membranes  are moist.  Respiratory: clear to auscultation bilaterally, no wheezing, no crackles. Normal respiratory effort. No accessory muscle use.  Cardiovascular: Regular rate and rhythm, no murmurs / rubs / gallops. No extremity edema. 2+ pedal pulses. No carotid bruits.  Abdomen: no tenderness, no masses palpated. No hepatosplenomegaly. Bowel sounds positive.  Musculoskeletal: no clubbing / cyanosis. No joint deformity upper and lower extremities. Good ROM, no contractures. Normal muscle tone.  Skin: no rashes, lesions, ulcers. No induration Neurologic: CN 2-12 grossly intact. Sensation intact, DTR normal. Strength 5/5 in all 4.  Psychiatric: Normal judgment and insight. Alert and oriented x 3. Normal mood.    Impression and Plan:  Depression, unspecified depression type  -Is on many antidepressants. -I feel we need psych referral to help [place] consolidate, will request.  Other chronic pain  -I have advised her, that I am not willing to enter into a chronic pain contract for oxycodone with her given multitude of medications she is on and her h/o mental health disorders. -Have offered referral to pain management and she agrees.  Anxiety  -Takes Xanax TID (I feel her dosing is excessive, especially given ongoing sedation described by husband). -As above, will refer to psychiatry.   Tobacco abuse  -Have not had time to address this time.  COPD exacerbation ([place])  Acute respiratory failure with hypoxia ([facility]) -Ambulating sats dropped to 86% without oxygen. -Continue o2 for now. -Will refer to pulmonary. -She has good breath sounds in both bases which makes it unlikely that she has reaccumulated her pleural effusion.  Hypothyroidism -Check TSH next visit. -Continue synthroid.  Hyperlipidemia, unspecified hyperlipidemia type -continue crestor. -check lipids next visit.  Pneumonia due to infectious organism, unspecified laterality, unspecified part of lung -She refuses to finish off Rx for  abx despite recommendations  Gastroesophageal reflux disease without esophagitis -Well controlled on PPI.  Blood in stool -Recommended she follow up with GI as recommended by discharging physician.   Time spent: 60 minutes. Greater than 50% of this time was spent in direct contact with the patient, coordinating care and discussing relevant ongoing clinical issues.   Patient Instructions  -Nice meeting you today!!  -Will send referrals to psychiatry, pulmonary (you are still needing oxygen) and to pain clinic.  -Please schedule follow up in 8 weeks.     Lelon Frohlich, MD  [facility]

## 2018-09-17 NOTE — Progress Notes (Signed)
Room air resting : 91 % no O2 Room air during exertion: 86% no O2 Room air resting: 96% with 3 liters O2

## 2018-09-17 NOTE — Patient Instructions (Signed)
-  Nice meeting you today!!  -Will send referrals to psychiatry, pulmonary (you are still needing oxygen) and to pain clinic.  -Please schedule follow up in 8 weeks.

## 2018-09-23 ENCOUNTER — Other Ambulatory Visit: Payer: Self-pay | Admitting: *Deleted

## 2018-09-23 DIAGNOSIS — R195 Other fecal abnormalities: Secondary | ICD-10-CM

## 2018-09-23 DIAGNOSIS — K921 Melena: Secondary | ICD-10-CM

## 2018-09-23 DIAGNOSIS — D649 Anemia, unspecified: Secondary | ICD-10-CM

## 2018-09-23 NOTE — Telephone Encounter (Signed)
Patient called. She is scheduled for EGD with MAC 9/17 at 8:30am. Patient aware will need pre-op appt. I will mail instructions with pre-op appt to her (confirmed address). Orders entered

## 2018-09-24 ENCOUNTER — Encounter: Payer: Self-pay | Admitting: *Deleted

## 2018-09-27 DIAGNOSIS — J441 Chronic obstructive pulmonary disease with (acute) exacerbation: Secondary | ICD-10-CM | POA: Diagnosis not present

## 2018-09-30 ENCOUNTER — Telehealth: Payer: Self-pay | Admitting: Internal Medicine

## 2018-09-30 NOTE — Telephone Encounter (Signed)
See refill request for Alprazolam and Oxycodone.

## 2018-09-30 NOTE — Telephone Encounter (Signed)
Copied from [place] [id number]. Topic: Quick Communication - Rx Refill/Question >> Sep 30, 2018 12:09 PM Rainey Pines A wrote: Medication:ALPRAZolam (XANAX) 0.5 MG tablet ,oxycodone (OXY-IR) 5 MG capsule (Patient only has 2 pills left .)  Has the patient contacted their pharmacy? Yes (Agent: If no, request that the patient contact the pharmacy for the refill.) (Agent: If yes, when and what did the pharmacy advise?)Contact PCP  Preferred Pharmacy (with phone number or street name): [place] [place] [facility] [place] [phone] (Phone) [phone] (Fax)    Agent: Please be advised that RX refills may take up to 3 business days. We ask that you follow-up with your pharmacy.

## 2018-10-02 NOTE — Telephone Encounter (Signed)
Left message on machine for patient to return our call CRM 

## 2018-10-02 NOTE — Telephone Encounter (Signed)
See my note. I told her I was not willing to enter into a pain or benzo contract with her. Referrals to pain management and psychiatry should have been placed. If she has made these appointments, I might be willing to give her 1 month until she can get in, but no further afterwards. I feel she should NOT be on these medications given her history.

## 2018-10-05 NOTE — Telephone Encounter (Signed)
Pt called and said she had gotten both of filled and did not need it. She said she went to another clinic not the pain clinic.(where she used to go)  Wants a refill in Oct/ but being honest today

## 2018-10-07 ENCOUNTER — Ambulatory Visit: Payer: Medicare Other | Admitting: Gastroenterology

## 2018-10-15 NOTE — Patient Instructions (Signed)
62    Your procedure is scheduled on: 10/22/2018  Report to [facility] at 7:00    AM.  Call this number if you have problems the morning of surgery: [phone]   Remember:   Do not drink or eat food:After Midnight.      Take these medicines the morning of surgery with A SIP OF WATER: Levothyroxine, Cymbalta, Flonase, protonix, and Symbicort.  Oxycodone and xanax if needed   Do not wear jewelry, make-up or nail polish.  Do not wear lotions, powders, or perfumes. You may wear deodorant.                Do not bring valuables to the hospital.  Contacts, dentures or bridgework may not be worn into surgery.  Leave suitcase in the car. After surgery it may be brought to your room.  For patients admitted to the hospital, checkout time is 11:00 AM the day of discharge.   Patients discharged the day of surgery will not be allowed to drive home.                                                                                                                                    Endoscopy Care After Please read the instructions outlined below and refer to this sheet in the next few weeks. These discharge instructions provide you with general information on caring for yourself after you leave the hospital. Your doctor may also give you specific instructions. While your treatment has been planned according to the most current medical practices available, unavoidable complications occasionally occur. If you have any problems or questions after discharge, please call your doctor. HOME CARE INSTRUCTIONS Activity  You may resume your regular activity but move at a slower pace for the next 24 hours.   Take frequent rest periods for the next 24 hours.   Walking will help expel (get rid of) the air and reduce the bloated feeling in your abdomen.   No driving for 24 hours (because of the anesthesia (medicine) used during the test).   You may shower.   Do not sign any important legal documents or operate  any machinery for 24 hours (because of the anesthesia used during the test).  Nutrition  Drink plenty of fluids.   You may resume your normal diet.   Begin with a light meal and progress to your normal diet.   Avoid alcoholic beverages for 24 hours or as instructed by your caregiver.  Medications You may resume your normal medications unless your caregiver tells you otherwise. What you can expect today  You may experience abdominal discomfort such as a feeling of fullness or "gas" pains.   You may experience a sore throat for 2 to 3 days. This is normal. Gargling with salt water may help this.  Follow-up Your doctor will discuss the results of your test with you. SEEK IMMEDIATE MEDICAL CARE IF:  You have  excessive nausea (feeling sick to your stomach) and/or vomiting.   You have severe abdominal pain and distention (swelling).   You have trouble swallowing.   You have a temperature over 100 F (37.8 C).   You have rectal bleeding or vomiting of blood.  Document Released: 09/05/2003 Document Revised: 01/10/2011 Document Reviewed: 03/18/2007

## 2018-10-19 ENCOUNTER — Other Ambulatory Visit (HOSPITAL_COMMUNITY)
Admission: RE | Admit: 2018-10-19 | Discharge: 2018-10-19 | Disposition: A | Discharge status: Home / Self Care | Payer: Medicare Other | Source: Ambulatory Visit | Attending: Gastroenterology | Admitting: Gastroenterology

## 2018-10-19 ENCOUNTER — Encounter (HOSPITAL_COMMUNITY)
Admission: RE | Admit: 2018-10-19 | Discharge: 2018-10-19 | Disposition: A | Discharge status: Home / Self Care | Payer: Medicare Other | Source: Ambulatory Visit | Attending: Gastroenterology | Admitting: Gastroenterology

## 2018-10-19 ENCOUNTER — Other Ambulatory Visit: Payer: Self-pay

## 2018-10-19 ENCOUNTER — Encounter (HOSPITAL_COMMUNITY): Payer: Self-pay

## 2018-10-19 ENCOUNTER — Telehealth: Payer: Self-pay | Admitting: Gastroenterology

## 2018-10-19 DIAGNOSIS — Z01812 Encounter for preprocedural laboratory examination: Secondary | ICD-10-CM | POA: Insufficient documentation

## 2018-10-19 DIAGNOSIS — Z20828 Contact with and (suspected) exposure to other viral communicable diseases: Secondary | ICD-10-CM | POA: Insufficient documentation

## 2018-10-19 HISTORY — DX: Dependence on supplemental oxygen: Z99.81

## 2018-10-19 LAB — CBC
HCT: 37.9 % (ref 36.0–46.0)
Hemoglobin: 11.4 g/dL — ABNORMAL LOW (ref 12.0–15.0)
MCH: 29.6 pg (ref 26.0–34.0)
MCHC: 30.1 g/dL (ref 30.0–36.0)
MCV: 98.4 fL (ref 80.0–100.0)
Platelets: 300 10*3/uL (ref 150–400)
RBC: 3.85 MIL/uL — ABNORMAL LOW (ref 3.87–5.11)
RDW: 17.1 % — ABNORMAL HIGH (ref 11.5–15.5)
WBC: 10.7 10*3/uL — ABNORMAL HIGH (ref 4.0–10.5)
nRBC: 0 % (ref 0.0–0.2)

## 2018-10-19 LAB — BASIC METABOLIC PANEL
Anion gap: 9 (ref 5–15)
BUN: 12 mg/dL (ref 8–23)
CO2: 30 mmol/L (ref 22–32)
Calcium: 9.5 mg/dL (ref 8.9–10.3)
Chloride: 99 mmol/L (ref 98–111)
Creatinine, Ser: 1.22 mg/dL — ABNORMAL HIGH (ref 0.44–1.00)
GFR calc Af Amer: 55 mL/min — ABNORMAL LOW (ref >60)
GFR calc non Af Amer: 47 mL/min — ABNORMAL LOW (ref >60)
Glucose, Bld: 108 mg/dL — ABNORMAL HIGH (ref 70–99)
Potassium: 4.1 mmol/L (ref 3.5–5.1)
Sodium: 138 mmol/L (ref 135–145)

## 2018-10-19 NOTE — Telephone Encounter (Signed)
Called pt, COVID testing site closed at 12:00pm before she finished with pre-op appt. They told her to have COVID test at 8:00am tomorrow. No appt was scheduled per pt's appt desk. Appt scheduled for 8:35am tomorrow morning so pt will be on list for the day. Advised pt to go for test as she had been advised. Melanie informed.

## 2018-10-19 NOTE — Telephone Encounter (Signed)
Melanie called from Short Stay to let us know patient didn't have her covid test today and the covid site closes at noon today. She still has time to have covid if she has it done tomorrow or Wednesday.

## 2018-10-20 ENCOUNTER — Other Ambulatory Visit (HOSPITAL_COMMUNITY)
Admission: RE | Admit: 2018-10-20 | Discharge: 2018-10-20 | Disposition: A | Discharge status: Home / Self Care | Payer: Medicare Other | Source: Ambulatory Visit | Attending: Gastroenterology | Admitting: Gastroenterology

## 2018-10-20 DIAGNOSIS — Z01812 Encounter for preprocedural laboratory examination: Secondary | ICD-10-CM | POA: Diagnosis not present

## 2018-10-20 LAB — SARS CORONAVIRUS 2 (TAT 6-24 HRS): SARS Coronavirus 2: NEGATIVE

## 2018-10-21 ENCOUNTER — Encounter: Payer: Self-pay | Admitting: Physical Medicine and Rehabilitation

## 2018-10-22 ENCOUNTER — Encounter (HOSPITAL_COMMUNITY): Payer: Self-pay | Admitting: Emergency Medicine

## 2018-10-22 ENCOUNTER — Other Ambulatory Visit: Payer: Self-pay

## 2018-10-22 ENCOUNTER — Ambulatory Visit (HOSPITAL_COMMUNITY): Payer: Medicare Other | Admitting: Anesthesiology

## 2018-10-22 ENCOUNTER — Encounter (HOSPITAL_COMMUNITY)
Admission: RE | Disposition: A | Discharge status: Home / Self Care | Payer: Self-pay | Source: Home / Self Care | Attending: Gastroenterology

## 2018-10-22 ENCOUNTER — Ambulatory Visit (HOSPITAL_COMMUNITY)
Admission: RE | Admit: 2018-10-22 | Discharge: 2018-10-22 | Disposition: A | Discharge status: Home / Self Care | Payer: Medicare Other | Attending: Gastroenterology | Admitting: Gastroenterology

## 2018-10-22 DIAGNOSIS — I739 Peripheral vascular disease, unspecified: Secondary | ICD-10-CM | POA: Insufficient documentation

## 2018-10-22 DIAGNOSIS — E785 Hyperlipidemia, unspecified: Secondary | ICD-10-CM | POA: Insufficient documentation

## 2018-10-22 DIAGNOSIS — K2950 Unspecified chronic gastritis without bleeding: Secondary | ICD-10-CM | POA: Diagnosis not present

## 2018-10-22 DIAGNOSIS — I509 Heart failure, unspecified: Secondary | ICD-10-CM | POA: Diagnosis not present

## 2018-10-22 DIAGNOSIS — K2970 Gastritis, unspecified, without bleeding: Secondary | ICD-10-CM | POA: Diagnosis not present

## 2018-10-22 DIAGNOSIS — F329 Major depressive disorder, single episode, unspecified: Secondary | ICD-10-CM | POA: Diagnosis not present

## 2018-10-22 DIAGNOSIS — Z9981 Dependence on supplemental oxygen: Secondary | ICD-10-CM | POA: Diagnosis not present

## 2018-10-22 DIAGNOSIS — Z79890 Hormone replacement therapy (postmenopausal): Secondary | ICD-10-CM | POA: Diagnosis not present

## 2018-10-22 DIAGNOSIS — F419 Anxiety disorder, unspecified: Secondary | ICD-10-CM | POA: Insufficient documentation

## 2018-10-22 DIAGNOSIS — Z7982 Long term (current) use of aspirin: Secondary | ICD-10-CM | POA: Diagnosis not present

## 2018-10-22 DIAGNOSIS — K921 Melena: Secondary | ICD-10-CM

## 2018-10-22 DIAGNOSIS — K319 Disease of stomach and duodenum, unspecified: Secondary | ICD-10-CM | POA: Insufficient documentation

## 2018-10-22 DIAGNOSIS — Z79891 Long term (current) use of opiate analgesic: Secondary | ICD-10-CM | POA: Diagnosis not present

## 2018-10-22 DIAGNOSIS — Z7952 Long term (current) use of systemic steroids: Secondary | ICD-10-CM | POA: Diagnosis not present

## 2018-10-22 DIAGNOSIS — K219 Gastro-esophageal reflux disease without esophagitis: Secondary | ICD-10-CM | POA: Insufficient documentation

## 2018-10-22 DIAGNOSIS — R195 Other fecal abnormalities: Secondary | ICD-10-CM

## 2018-10-22 DIAGNOSIS — Z8542 Personal history of malignant neoplasm of other parts of uterus: Secondary | ICD-10-CM | POA: Diagnosis not present

## 2018-10-22 DIAGNOSIS — Z79899 Other long term (current) drug therapy: Secondary | ICD-10-CM | POA: Diagnosis not present

## 2018-10-22 DIAGNOSIS — I2510 Atherosclerotic heart disease of native coronary artery without angina pectoris: Secondary | ICD-10-CM | POA: Diagnosis not present

## 2018-10-22 DIAGNOSIS — D649 Anemia, unspecified: Secondary | ICD-10-CM | POA: Diagnosis not present

## 2018-10-22 DIAGNOSIS — I110 Hypertensive heart disease with heart failure: Secondary | ICD-10-CM | POA: Insufficient documentation

## 2018-10-22 DIAGNOSIS — E039 Hypothyroidism, unspecified: Secondary | ICD-10-CM | POA: Diagnosis not present

## 2018-10-22 DIAGNOSIS — F17210 Nicotine dependence, cigarettes, uncomplicated: Secondary | ICD-10-CM | POA: Diagnosis not present

## 2018-10-22 DIAGNOSIS — J449 Chronic obstructive pulmonary disease, unspecified: Secondary | ICD-10-CM | POA: Diagnosis not present

## 2018-10-22 HISTORY — PX: ESOPHAGOGASTRODUODENOSCOPY (EGD) WITH PROPOFOL: SHX5813

## 2018-10-22 HISTORY — PX: BIOPSY: SHX5522

## 2018-10-22 SURGERY — ESOPHAGOGASTRODUODENOSCOPY (EGD) WITH PROPOFOL
Anesthesia: General

## 2018-10-22 MED ORDER — IPRATROPIUM-ALBUTEROL 0.5-2.5 (3) MG/3ML IN SOLN
3.0000 mL | Freq: Once | RESPIRATORY_TRACT | Status: AC
  Administered 2018-10-22: 3 mL via RESPIRATORY_TRACT

## 2018-10-22 MED ORDER — LACTATED RINGERS IV SOLN
Freq: Once | INTRAVENOUS | Status: AC
  Administered 2018-10-22: 08:00:00 via INTRAVENOUS

## 2018-10-22 MED ORDER — ONDANSETRON HCL 4 MG/2ML IJ SOLN: 4.0000 mg | Freq: Once | INTRAMUSCULAR | Status: DC | PRN

## 2018-10-22 MED ORDER — LIDOCAINE VISCOUS HCL 2 % MT SOLN: 5.0000 mL | Freq: Once | OROMUCOSAL | Status: DC

## 2018-10-22 MED ORDER — IPRATROPIUM-ALBUTEROL 0.5-2.5 (3) MG/3ML IN SOLN
RESPIRATORY_TRACT | Status: AC
  Filled 2018-10-22: qty 3

## 2018-10-22 MED ORDER — PROPOFOL 500 MG/50ML IV EMUL
INTRAVENOUS | Status: DC | PRN
  Administered 2018-10-22: 125 ug/kg/min via INTRAVENOUS

## 2018-10-22 MED ORDER — CHLORHEXIDINE GLUCONATE CLOTH 2 % EX PADS: 6.0000 | MEDICATED_PAD | Freq: Once | CUTANEOUS | Status: DC

## 2018-10-22 MED ORDER — LACTATED RINGERS IV SOLN
INTRAVENOUS | Status: DC | PRN
  Administered 2018-10-22: 08:00:00 via INTRAVENOUS

## 2018-10-22 MED ORDER — PROPOFOL 10 MG/ML IV BOLUS
INTRAVENOUS | Status: DC | PRN
  Administered 2018-10-22: 40 mg via INTRAVENOUS
  Administered 2018-10-22: 20 mg via INTRAVENOUS

## 2018-10-22 NOTE — Anesthesia Procedure Notes (Signed)
Procedure Name: MAC Date/Time: 10/22/2018 7:47 AM Performed by: Vista Deck, CRNA Pre-anesthesia Checklist: Patient identified, Emergency Drugs available, Suction available, Timeout performed and Patient being monitored Patient Re-evaluated:Patient Re-evaluated prior to induction Oxygen Delivery Method: Non-rebreather mask

## 2018-10-22 NOTE — Anesthesia Preprocedure Evaluation (Signed)
Anesthesia Evaluation  Patient identified by MRN, date of birth, ID band Patient awake    Reviewed: Allergy & Precautions, NPO status , Patient's Chart, lab work & pertinent test results  Airway Mallampati: II  TM Distance: >3 FB Neck ROM: Limited    Dental  (+) Upper Dentures, Lower Dentures   Pulmonary pneumonia, COPD, Current Smoker,  CLINICAL DATA:  Status post right thoracentesis.  EXAM: CHEST  1 VIEW  COMPARISON:  Radiograph of same day.  FINDINGS: Stable cardiomegaly. Right pleural effusion is significantly smaller status post thoracentesis. No pneumothorax is noted. Right basilar atelectasis or infiltrate is noted. Minimal left basilar subsegmental atelectasis is noted. Bony thorax is unremarkable.  IMPRESSION: Significantly improved right pleural effusion is noted status post thoracentesis. No pneumothorax is noted. Mild right basilar subsegmental atelectasis or infiltrate is noted.   Electronically Signed   By: Marijo Conception M.D.   On: 08/27/2018 15:59   breath sounds clear to auscultation       Cardiovascular Exercise Tolerance: Good hypertension, Pt. on medications + CAD, + Peripheral Vascular Disease and +CHF   Rhythm:Regular Rate:Normal  [id number] 24-Aug-2018 13:48:07 [facility] ROUTINE RECORD Sinus rhythm Probable lateral infarct, age indeterminate Confirmed by Davonna Belling [phone]) on 08/24/2018 2:10:11 PM 38mm/s 25mm/mV 150Hz  9.0.4 CID: [id number] Confirmed By: Davonna Belling Vent. rate 83 BPM PR interval * ms QRS duration 112 ms QT/QTc 383/450 ms Echo - 08/24/18  1. The left ventricle has low normal systolic function, with an ejection fraction of 50-55%. The cavity size was normal. There is moderately increased left ventricular wall thickness. Left ventricular diastolic Doppler parameters are consistent with  impaired relaxation. Elevated mean left atrial  pressure.  2. The right ventricle has normal systolic function. The cavity was normal. There is no increase in right ventricular wall thickness.  3. Left atrial size was mildly dilated.  4. Right atrial size was mildly dilated.  5. No evidence of mitral valve stenosis.  6. No stenosis of the aortic valve.  7. The aortic root is normal in size and structure.  8. Pulmonary hypertension is indeterminant, inadequate TR jet.    Neuro/Psych  Headaches, PSYCHIATRIC DISORDERS Anxiety Depression    GI/Hepatic Neg liver ROS, GERD  Medicated,  Endo/Other  Hypothyroidism   Renal/GU Renal disease  negative genitourinary   Musculoskeletal  (+) Arthritis , Osteoarthritis,  IMPRESSION: 1. Progressive spondylosis at C5-6 with a right foraminal disc osteophyte complex causing at least moderate right foraminal narrowing and probable right C6 nerve root encroachment. 2. No cord deformity or abnormal cord signal. 3. Mild spondylosis at the other levels is unchanged.   Electronically Signed   By: Richardean Sale M.D.   On: 11/19/2017 15:07    Abdominal   Peds  Hematology  (+) anemia ,   Anesthesia Other Findings CHOLECYSTECTOMY ABDOMINAL HYSTERECTOMY NASAL SINUS SURGERY DILATION AND CURETTAGE OF UTERUS LUMBAR LAMINECTOMY LAPAROTOMY CARPAL TUNNEL RELEASE CARDIAC CATHETERIZATION BACK SURGERY LEFT HEART CATHETERIZATION WITH CORONARY ANGIOGRAM  Hyperlipidemia Hypothyroid Tobacco abuse Anxiety and depression Cerebrovascular disease Chronic pain Migraine headache Gastroesophageal reflux disease Laboratory test Sepsis ([place]) PNA (pneumonia) Adrenal insufficiency ([facility]) Subclavian steal syndrome HTN (hypertension) CAP (community acquired pneumonia) Pneumonia COPD exacerbation ([place]) Acute kidney injury ([place]) Acute respiratory failure with hypoxia ([place]) Hypokalemia Elevated LFTs Anemia Gastrointestinal hemorrhage     Reproductive/Obstetrics  Anesthesia Physical Anesthesia Plan  ASA: IV  Anesthesia Plan: General   Post-op Pain Management:    Induction: Intravenous  PONV Risk Score and Plan:   Airway Management Planned: Nasal Cannula and Natural Airway  Additional Equipment:   Intra-op Plan:   Post-operative Plan:   Informed Consent: I have reviewed the patients History and Physical, chart, labs and discussed the procedure including the risks, benefits and alternatives for the proposed anesthesia with the patient or authorized representative who has indicated his/her understanding and acceptance.     Dental advisory given  Plan Discussed with: CRNA  Anesthesia Plan Comments:        Anesthesia Quick Evaluation

## 2018-10-22 NOTE — H&P (Signed)
Primary Care Physician:  [name], [name], MD Primary Gastroenterologist:  Dr. Oneida Alar  Pre-Procedure History & Physical: HPI:  [name] is a 63 y.o. female here for Anemia.  Past Medical History:  Diagnosis Date  . Anemia   . Anxiety and depression   . Bronchitis   . Cancer ([place])    uterus  . CHF (congestive heart failure) ([place])   . Chronic pain 11/22/2010  . COPD (chronic obstructive pulmonary disease) ([place])   . Coronary artery disease   . Gastroesophageal reflux disease 11/22/2010  . Hyperlipidemia   . Hypertension   . Hypothyroid   . Migraine headache 11/22/2010  . Near syncope   . On home oxygen therapy    started 08/2018  . Peripheral vascular disease ([place])   . Pneumonia   . Tobacco abuse    1/2 pack per day    Past Surgical History:  Procedure Laterality Date  . ABDOMINAL HYSTERECTOMY     without oophorectomy for neoplastic disease  . BACK SURGERY    . CARDIAC CATHETERIZATION  07/2013   normal coronary arteries  . CARPAL TUNNEL RELEASE     Left  . CHOLECYSTECTOMY  2007  . DILATION AND CURETTAGE OF UTERUS    . LAPAROTOMY    . LEFT HEART CATHETERIZATION WITH CORONARY ANGIOGRAM N/A 07/12/2013   Procedure: LEFT HEART CATHETERIZATION WITH CORONARY ANGIOGRAM;  Surgeon: Leonie Man, MD;  Location: [facility] CATH LAB;  Service: Cardiovascular;  Laterality: N/A;  . LUMBAR LAMINECTOMY  x3   Left; complicated by neurologic dysfunction  . NASAL SINUS SURGERY      Prior to Admission medications   Medication Sig Start Date End Date Taking? Authorizing Provider  albuterol (PROAIR HFA) 108 (90 Base) MCG/ACT inhaler Inhale 2 puffs into the lungs every 4 (four) hours as needed for wheezing or shortness of breath (cough). 08/27/18  Yes Emokpae, Courage, MD  albuterol (PROVENTIL) (2.5 MG/3ML) 0.083% nebulizer solution Take 3 mLs (2.5 mg total) by nebulization every 4 (four) hours as needed for wheezing or shortness of breath (cough). 08/27/18  Yes Emokpae, Courage,  MD  ALPRAZolam Duanne Moron) 0.5 MG tablet Take 0.5 mg by mouth 3 (three) times daily as needed for anxiety.   Yes [provider]  aspirin EC 81 MG tablet Take 81 mg by mouth daily.   Yes [provider]  budesonide-formoterol (SYMBICORT) 80-4.5 MCG/ACT inhaler Inhale 2 puffs into the lungs 2 (two) times daily.   Yes [provider]  DULoxetine (CYMBALTA) 60 MG capsule Take 1 capsule (60 mg total) by mouth 2 (two) times daily. 08/27/18  Yes Emokpae, Courage, MD  fluticasone (FLONASE) 50 MCG/ACT nasal spray Place 1-2 sprays into both nostrils daily as needed for allergies.    Yes [provider]  furosemide (LASIX) 20 MG tablet Take 10 mg by mouth daily as needed for edema. 09/12/18  Yes [provider]  levothyroxine (LEVOTHROID) 25 MCG tablet Take 1 tablet (25 mcg total) by mouth every morning. 08/28/18  Yes Emokpae, Courage, MD  mirtazapine (REMERON) 45 MG tablet Take 45 mg by mouth at bedtime.   Yes [provider]  oxycodone (OXY-IR) 5 MG capsule Take 5 mg by mouth 3 (three) times daily.   Yes [provider]  pantoprazole (PROTONIX) 40 MG tablet Take 1 tablet (40 mg total) by mouth daily. 08/27/18  Yes Emokpae, Courage, MD  rosuvastatin (CRESTOR) 40 MG tablet Take 40 mg by mouth at bedtime.    Yes [provider]  ALPRAZolam (XANAX) 1 MG tablet Take 0.5 tablets (0.5 mg total) by mouth 3 (three) times daily as needed for anxiety. Patient not taking: Reported on 10/14/2018 08/27/18   Roxan Hockey, MD    Allergies as of 09/23/2018 - Review Complete 09/17/2018  Allergen Reaction Noted  . Acetaminophen-codeine Nausea And Vomiting 04/19/2010  . Aspirin Nausea And Vomiting 04/19/2010  . Celecoxib Other (See Comments) 04/19/2010  . Levaquin [levofloxacin in d5w] Itching 02/21/2012  . Lipitor [atorvastatin] Other (See Comments) 04/22/2013  . Sulfa antibiotics Nausea And Vomiting 02/20/2012  . Adhesive [tape] Rash 03/06/2012  . Latex  Rash 04/19/2010    Family History  Problem Relation Age of Onset  . Colon polyps Mother   . Deep vein thrombosis Mother   . Heart disease Mother   . Hypertension Mother   . Bleeding Disorder Mother   . Hypertension Sister   . Heart disease Brother        before age 21  . Hypertension Brother     Social History   Socioeconomic History  . Marital status: Married    Spouse name: Not on file  . Number of children: 2  . Years of education: Not on file  . Highest education level: Not on file  Occupational History    Comment: Disabled  Social Needs  . Financial resource strain: Not on file  . Food insecurity    Worry: Not on file    Inability: Not on file  . Transportation needs    Medical: Not on file    Non-medical: Not on file  Tobacco Use  . Smoking status: Current Every Day Smoker    Packs/day: 0.25    Years: 40.00    Pack years: 10.00    Types: Cigarettes    Start date: 09/06/1972  . Smokeless tobacco: Never Used  Substance and Sexual Activity  . Alcohol use: No    Alcohol/week: 0.0 standard drinks  . Drug use: No  . Sexual activity: Not Currently    Birth control/protection: Surgical  Lifestyle  . Physical activity    Days per week: Not on file    Minutes per session: Not on file  . Stress: Not on file  Relationships  . Social Herbalist on phone: Not on file    Gets together: Not on file    Attends religious service: Not on file    Active member of club or organization: Not on file    Attends meetings of clubs or organizations: Not on file    Relationship status: Not on file  . Intimate partner violence    Fear of current or ex partner: Not on file    Emotionally abused: Not on file    Physically abused: Not on file    Forced sexual activity: Not on file  Other Topics Concern  . Not on file  Social History Narrative  . Not on file    Review of Systems: See HPI, otherwise negative ROS   Physical Exam: BP 136/64   Pulse 83   Temp  97.7 F (36.5 C) (Oral)   Resp (!) 23   SpO2 97%  General:   Alert,  pleasant and cooperative in NAD Head:  Normocephalic and atraumatic. Neck:  Supple; Lungs:  Clear throughout to auscultation.    Heart:  Regular rate and rhythm. Abdomen:  Soft, nontender and nondistended. Normal bowel sounds, without guarding, and without rebound.   Neurologic:  Alert and  oriented  x4;  grossly normal neurologically.  Impression/Plan:     Anemia  PLAN: EGD TODAY.  DISCUSSED PROCEDURE, BENEFITS, & RISKS: < 1% chance of medication reaction, bleeding, perforation, or ASPIRATION.

## 2018-10-22 NOTE — Op Note (Signed)
[facility] Patient Name: [name] Procedure Date: 10/22/2018 7:41 AM MRN: [id number] Date of Birth: [birth year] Attending MD: Barney Drain MD, MD CSN: [id number] Age: 63 Admit Type: Outpatient Procedure:                Upper GI endoscopy WITH COLD FORCEPS BIOPSY Indications:              Anemia Providers:                Barney Drain MD, MD, Lurline Del, RN, Nelma Rothman,                            Technician Referring MD:             [name]. [name] Medicines:                Propofol per Anesthesia Complications:            No immediate complications. Estimated Blood Loss:     Estimated blood loss was minimal. Procedure:                Pre-Anesthesia Assessment:                           - Prior to the procedure, a History and Physical                            was performed, and patient medications and                            allergies were reviewed. The patient's tolerance of                            previous anesthesia was also reviewed. The risks                            and benefits of the procedure and the sedation                            options and risks were discussed with the patient.                            All questions were answered, and informed consent                            was obtained. Prior Anticoagulants: The patient has                            taken no previous anticoagulant or antiplatelet                            agents except for aspirin. ASA Grade Assessment: II                            - A patient with mild systemic disease. After  reviewing the risks and benefits, the patient was                            deemed in satisfactory condition to undergo the                            procedure. After obtaining informed consent, the                            endoscope was passed under direct vision.                            Throughout the procedure, the patient's blood      pressure, pulse, and oxygen saturations were                            monitored continuously. The GIF-H190 [id number]) was                            introduced through the mouth, and advanced to the                            second part of duodenum. The upper GI endoscopy was                            accomplished without difficulty. The patient                            tolerated the procedure well. Scope In: 7:57:12 AM Scope Out: 8:06:27 AM Total Procedure Duration: 0 hours 9 minutes 15 seconds  Findings:      The examined esophagus was normal.      Segmental mild inflammation characterized by congestion (edema) and       erythema was found on the greater curvature of the stomach and in the       gastric antrum. Biopsies were taken with a cold forceps for Helicobacter       pylori testing.      The duodenal bulb was normal. Biopsies for histology were taken with a       cold forceps for evaluation of celiac disease.      The second portion of the duodenum was normal. Biopsies for histology       were taken with a cold forceps for evaluation of celiac disease. Impression:               - ANEMIA DUE TO Gastritis/ACUTE ILLNESS, NOW                            RESOLVING. Moderate Sedation:      Per Anesthesia Care Recommendation:           - Patient has a contact number available for                            emergencies. The signs and symptoms of potential  delayed complications were discussed with the                            patient. Return to normal activities tomorrow.                            Written discharge instructions were provided to the                            patient.                           - High fiber diet.                           - Continue present medications.                           - Await pathology results.                           - Return to GI office in 6 months. Procedure Code(s):        --- Professional ---                            [phone], Esophagogastroduodenoscopy, flexible,                            transoral; with biopsy, single or multiple Diagnosis Code(s):        --- Professional ---                           K29.70, Gastritis, unspecified, without bleeding                           D64.9, Anemia, unspecified CPT copyright 2019 American Medical Association. All rights reserved. The codes documented in this report are preliminary and upon coder review may  be revised to meet current compliance requirements. Barney Drain, MD Barney Drain MD, MD 10/22/2018 8:33:27 AM This report has been signed electronically. Number of Addenda: 0

## 2018-10-22 NOTE — Anesthesia Postprocedure Evaluation (Signed)
Anesthesia Post Note  Patient: [name]  Procedure(s) Performed: ESOPHAGOGASTRODUODENOSCOPY (EGD) WITH PROPOFOL (N/A ) BIOPSY  Anesthesia Type: General Level of consciousness: awake and alert, oriented and patient cooperative Pain management: satisfactory to patient Vital Signs Assessment: post-procedure vital signs reviewed and stable Respiratory status: spontaneous breathing and patient connected to nasal cannula oxygen Cardiovascular status: stable Postop Assessment: no apparent nausea or vomiting Anesthetic complications: no     Last Vitals:  Vitals:   10/22/18 0724 10/22/18 0815  BP: 136/64 104/75  Pulse: 83 84  Resp: (!) 23 19  Temp: 36.5 C (P) 36.6 C  SpO2: 97% 94%    Last Pain:  Vitals:   10/22/18 0724  TempSrc: Oral  PainSc: 7                  Lonnette Shrode

## 2018-10-22 NOTE — Transfer of Care (Signed)
Immediate Anesthesia Transfer of Care Note  Patient: [name]  Procedure(s) Performed: ESOPHAGOGASTRODUODENOSCOPY (EGD) WITH PROPOFOL (N/A ) BIOPSY  Patient Location: PACU  Anesthesia Type:General  Level of Consciousness: awake, alert  and patient cooperative  Airway & Oxygen Therapy: Patient Spontanous Breathing nasal cannula  Post-op Assessment: Report given to RN and Post -op Vital signs reviewed and stable  Post vital signs: Reviewed and stable  Last Vitals:  Vitals Value Taken Time  BP 104/75 818  Temp 97.8 818  Pulse 84 818  Resp 24 818  SpO2 94 818    Last Pain:  Vitals:   10/22/18 0724  TempSrc: Oral  PainSc: 7          Complications: No apparent anesthesia complications

## 2018-10-22 NOTE — Discharge Instructions (Signed)
NO DEFINITE SOURCE FOR YOUR ANEMIA WAS IDENTIFIED. IT HAS IMPROVED FROM JUL 2020. IT WAS 9.6 TO 11.4(near normal) in SEP 2020. You have gastritis from aspirin & THIS CAN CAUSE LOW BLOOD COUNT. YOUR SMALL BOWEL LOOKED NORMAL. I biopsied your stomach and small bowel.    DRINK WATER TO KEEP YOUR URINE LIGHT YELLOW.  FOLLOW A LOW FAT DIET. MEATS SHOULD BE BAKED, BROILED, OR BOILED. AVOID FRIED FOODS. SEE INFO BELOW.   TO PREVENT ULCERS AND GASTRITIS DUE TO DAILY ASPIRIN USE, CONTINUE PROTONIX.  TAKE 30 MINUTES PRIOR TO YOUR FIRST MEAL EVERY DAY.   YOUR BIOPSY RESULTS WILL BE BACK IN 5 BUSINESS DAYS.  FOLLOW UP IN 6 MOS.  UPPER ENDOSCOPY AFTER CARE Read the instructions outlined below and refer to this sheet in the next week. These discharge instructions provide you with general information on caring for yourself after you leave the hospital. While your treatment has been planned according to the most current medical practices available, unavoidable complications occasionally occur. If you have any problems or questions after discharge, call DR. Connor Meacham, [phone].  ACTIVITY  You may resume your regular activity, but move at a slower pace for the next 24 hours.   Take frequent rest periods for the next 24 hours.   Walking will help get rid of the air and reduce the bloated feeling in your belly (abdomen).   No driving for 24 hours (because of the medicine (anesthesia) used during the test).   You may shower.   Do not sign any important legal documents or operate any machinery for 24 hours (because of the anesthesia used during the test).    NUTRITION  Drink plenty of fluids.   You may resume your normal diet as instructed by your doctor.   Begin with a light meal and progress to your normal diet. Heavy or fried foods are harder to digest and may make you feel sick to your stomach (nauseated).   Avoid alcoholic beverages for 24 hours or as instructed.    MEDICATIONS  You  may resume your normal medications.   WHAT YOU CAN EXPECT TODAY  Some feelings of bloating in the abdomen.   Passage of more gas than usual.    IF YOU HAD A BIOPSY TAKEN DURING THE UPPER ENDOSCOPY:  Eat a soft diet IF YOU HAVE NAUSEA, BLOATING, ABDOMINAL PAIN, OR VOMITING.    FINDING OUT THE RESULTS OF YOUR TEST Not all test results are available during your visit. DR. Oneida Alar WILL CALL YOU WITHIN 14 DAYS OF YOUR PROCEDUE WITH YOUR RESULTS. Do not assume everything is normal if you have not heard from DR. Satvik Parco, CALL HER OFFICE AT [phone].  SEEK IMMEDIATE MEDICAL ATTENTION AND CALL THE OFFICE: [phone] IF:  You have more than a spotting of blood in your stool.   Your belly is swollen (abdominal distention).   You are nauseated or vomiting.   You have a temperature over 101F.   You have abdominal pain or discomfort that is severe or gets worse throughout the day.   Gastritis  Gastritis is an inflammation (the body's way of reacting to injury and/or infection) of the stomach. It is often caused by viral or bacterial (germ) infections. It can also be caused BY ASPIRIN, BC/GOODY POWDER'S, (IBUPROFEN) MOTRIN, OR ALEVE (NAPROXEN), chemicals (including alcohol), SPICY FOODS, and medications. This illness may be associated with generalized malaise (feeling tired, not well), UPPER ABDOMINAL STOMACH cramps, and fever. One common bacterial cause of gastritis is an  organism known as H. Pylori. This can be treated with antibiotics.

## 2018-10-23 ENCOUNTER — Other Ambulatory Visit: Payer: Self-pay | Admitting: Gastroenterology

## 2018-10-23 DIAGNOSIS — R238 Other skin changes: Secondary | ICD-10-CM | POA: Diagnosis not present

## 2018-10-23 LAB — SURGICAL PATHOLOGY

## 2018-10-26 ENCOUNTER — Encounter (HOSPITAL_COMMUNITY): Payer: Self-pay | Admitting: Gastroenterology

## 2018-10-27 ENCOUNTER — Ambulatory Visit: Payer: Medicare Other | Admitting: Gastroenterology

## 2018-10-27 ENCOUNTER — Encounter: Payer: Self-pay | Admitting: Gastroenterology

## 2018-10-27 ENCOUNTER — Telehealth: Payer: Self-pay | Admitting: Gastroenterology

## 2018-10-27 ENCOUNTER — Other Ambulatory Visit: Payer: Self-pay

## 2018-10-27 VITALS — BP 138/80 | HR 86 | Temp 96.2°F | Ht 62.0 in | Wt 117.8 lb

## 2018-10-27 DIAGNOSIS — R945 Abnormal results of liver function studies: Secondary | ICD-10-CM | POA: Diagnosis not present

## 2018-10-27 DIAGNOSIS — K219 Gastro-esophageal reflux disease without esophagitis: Secondary | ICD-10-CM | POA: Diagnosis not present

## 2018-10-27 DIAGNOSIS — R7989 Other specified abnormal findings of blood chemistry: Secondary | ICD-10-CM

## 2018-10-27 DIAGNOSIS — D649 Anemia, unspecified: Secondary | ICD-10-CM | POA: Diagnosis not present

## 2018-10-27 MED ORDER — PANTOPRAZOLE SODIUM 40 MG PO TBEC: 40.0000 mg | DELAYED_RELEASE_TABLET | Freq: Two times a day (BID) | ORAL | 3 refills | Status: DC

## 2018-10-27 NOTE — Patient Instructions (Signed)
Please have blood work done at [facility]. This is checking your liver numbers.  Please call your primary care about the referrals that have not been completed yet.   I have sent in Protonix to take twice a day, 30 minutes before breakfast and dinner.  We will see you in 3 months!  It was a pleasure to see you today. I want to create trusting relationships with patients to provide genuine, compassionate, and quality care. I value your feedback. If you receive a survey regarding your visit,  I greatly appreciate you taking time to fill this out.   Annitta Needs, PhD, ANP-BC [facility]

## 2018-10-27 NOTE — Progress Notes (Signed)
Referring Provider: Jani Gravel, MD Primary Care Physician:  [name], [name], MD Primary GI: Dr. Oneida Alar   Chief Complaint  Patient presents with  . Anemia    doing ok, had EGD last week    HPI:   [name] is a 63 y.o. female presenting today in hospital follow-up due to decompensated CHF, AKI, anemia with Hgb 7.9, melena, heme positive stools. Anemia multifactorial in setting of chronic renal insufficiency/CHF, +/- evolving IDA. While inpatient, elevated HFP felt due to ischemia. Negative hepatitis panel. Recently completed EGD as outpatient with mild reactive gastropathy, negative celiac. Last colonoscopy at [facility] possibly and still waiting reports. She is actually unsure. Hgb improved, with recent value 11.4.   2 liters [place] continuously and 3 liters if she is moving around. Used to being active with grandkids, fishing. Wants to get off oxygen in time for graduation. No rectal bleeding currently. Declining any further colonoscopy. No abdominal pain. Rare constipation, depending on how active she is. Wants to take Protonix BID, stating reflux is not controlled as well on once daily dosing.    Past Medical History:  Diagnosis Date  . Anemia   . Anxiety and depression   . Bronchitis   . Cancer ([place])    uterus  . CHF (congestive heart failure) ([place])   . Chronic pain 11/22/2010  . COPD (chronic obstructive pulmonary disease) ([place])   . Coronary artery disease   . Gastroesophageal reflux disease 11/22/2010  . Hyperlipidemia   . Hypertension   . Hypothyroid   . Migraine headache 11/22/2010  . Near syncope   . On home oxygen therapy    started 08/2018  . Peripheral vascular disease ([place])   . Pneumonia   . Tobacco abuse    1/2 pack per day    Past Surgical History:  Procedure Laterality Date  . ABDOMINAL HYSTERECTOMY     without oophorectomy for neoplastic disease  . BACK SURGERY    . BIOPSY  10/22/2018   Procedure: BIOPSY;  Surgeon: Danie Binder, MD;  [facility];  Service: Endoscopy;;  duodenal, gastric  . CARDIAC CATHETERIZATION  07/2013   normal coronary arteries  . CARPAL TUNNEL RELEASE     Left  . CHOLECYSTECTOMY  2007  . DILATION AND CURETTAGE OF UTERUS    . ESOPHAGOGASTRODUODENOSCOPY (EGD) WITH PROPOFOL N/A 10/22/2018   gastritis (mild reactive gastropathy, negative duodenal biopsies for celiac).   Marland Kitchen LAPAROTOMY    . LEFT HEART CATHETERIZATION WITH CORONARY ANGIOGRAM N/A 07/12/2013   Procedure: LEFT HEART CATHETERIZATION WITH CORONARY ANGIOGRAM;  Surgeon: Leonie Man, MD;  Location: [facility] CATH LAB;  Service: Cardiovascular;  Laterality: N/A;  . LUMBAR LAMINECTOMY  x3   Left; complicated by neurologic dysfunction  . NASAL SINUS SURGERY      Current Outpatient Medications  Medication Sig Dispense Refill  . albuterol (PROAIR HFA) 108 (90 Base) MCG/ACT inhaler Inhale 2 puffs into the lungs every 4 (four) hours as needed for wheezing or shortness of breath (cough). 18 g 1  . albuterol (PROVENTIL) (2.5 MG/3ML) 0.083% nebulizer solution Take 3 mLs (2.5 mg total) by nebulization every 4 (four) hours as needed for wheezing or shortness of breath (cough). 75 mL 12  . ALPRAZolam (XANAX) 0.5 MG tablet Take 0.5 mg by mouth 3 (three) times daily as needed for anxiety.    Marland Kitchen aspirin EC 81 MG tablet Take 81 mg by mouth daily.    . budesonide-formoterol (  SYMBICORT) 80-4.5 MCG/ACT inhaler Inhale 2 puffs into the lungs 2 (two) times daily.    . DULoxetine (CYMBALTA) 60 MG capsule Take 1 capsule (60 mg total) by mouth 2 (two) times daily. 60 capsule 3  . fluticasone (FLONASE) 50 MCG/ACT nasal spray Place 1-2 sprays into both nostrils daily as needed for allergies.     . furosemide (LASIX) 20 MG tablet Take 10 mg by mouth daily as needed for edema.    Marland Kitchen levothyroxine (LEVOTHROID) 25 MCG tablet Take 1 tablet (25 mcg total) by mouth every morning. 30 tablet 3  . mirtazapine (REMERON) 45 MG tablet Take 45 mg by mouth at bedtime.     Marland Kitchen oxycodone (OXY-IR) 5 MG capsule Take 5 mg by mouth 3 (three) times daily.    . pantoprazole (PROTONIX) 40 MG tablet Take 1 tablet (40 mg total) by mouth daily. 30 tablet 3  . pseudoephedrine-guaifenesin (MUCINEX D) 60-600 MG 12 hr tablet Take 1 tablet by mouth daily.    . rosuvastatin (CRESTOR) 40 MG tablet Take 40 mg by mouth at bedtime.     . ALPRAZolam (XANAX) 1 MG tablet Take 0.5 tablets (0.5 mg total) by mouth 3 (three) times daily as needed for anxiety. (Patient not taking: Reported on 10/14/2018) 1 tablet 0   No current facility-administered medications for this visit.     Allergies as of 10/27/2018 - Review Complete 10/27/2018  Allergen Reaction Noted  . Acetaminophen-codeine Nausea And Vomiting 04/19/2010  . Aspirin Nausea And Vomiting 04/19/2010  . Celecoxib Other (See Comments) 04/19/2010  . Levaquin [levofloxacin in d5w] Itching 02/21/2012  . Lipitor [atorvastatin] Other (See Comments) 04/22/2013  . Sulfa antibiotics Nausea And Vomiting 02/20/2012  . Adhesive [tape] Rash 03/06/2012  . Latex Rash 04/19/2010    Family History  Problem Relation Age of Onset  . Colon polyps Mother   . Deep vein thrombosis Mother   . Heart disease Mother   . Hypertension Mother   . Bleeding Disorder Mother   . Hypertension Sister   . Heart disease Brother        before age 56  . Hypertension Brother     Social History   Socioeconomic History  . Marital status: Married    Spouse name: Not on file  . Number of children: 2  . Years of education: Not on file  . Highest education level: Not on file  Occupational History    Comment: Disabled  Social Needs  . Financial resource strain: Not on file  . Food insecurity    Worry: Not on file    Inability: Not on file  . Transportation needs    Medical: Not on file    Non-medical: Not on file  Tobacco Use  . Smoking status: Current Every Day Smoker    Packs/day: 0.25    Years: 40.00    Pack years: 10.00    Types: Cigarettes     Start date: 09/06/1972  . Smokeless tobacco: Never Used  Substance and Sexual Activity  . Alcohol use: No    Alcohol/week: 0.0 standard drinks  . Drug use: No  . Sexual activity: Not Currently    Birth control/protection: Surgical  Lifestyle  . Physical activity    Days per week: Not on file    Minutes per session: Not on file  . Stress: Not on file  Relationships  . Social Herbalist on phone: Not on file    Gets together: Not on file  Attends religious service: Not on file    Active member of club or organization: Not on file    Attends meetings of clubs or organizations: Not on file    Relationship status: Not on file  Other Topics Concern  . Not on file  Social History Narrative  . Not on file    Review of Systems: Gen: Denies fever, chills, anorexia. Denies fatigue, weakness, weight loss.  CV: Denies chest pain, palpitations, syncope, peripheral edema, and claudication. Resp: see HPI GI: see HPI Derm: Denies rash, itching, dry skin Psych: Denies depression, anxiety, memory loss, confusion. No homicidal or suicidal ideation.  Heme: Denies bruising, bleeding, and enlarged lymph nodes.  Physical Exam: BP 138/80   Pulse 86   Temp (!) 96.2 F (35.7 C) (Temporal)   Ht 5\' 2"  (1.575 m)   Wt 117 lb 12.8 oz (53.4 kg)   BMI 21.55 kg/m  General:   Alert and oriented. No distress noted. Pleasant and cooperative.  Head:  Normocephalic and atraumatic. Eyes:  Conjuctiva clear without scleral icterus. Abdomen:  +BS, soft, non-tender and non-distended. No rebound or guarding. No HSM or masses noted. Msk:  Symmetrical without gross deformities. Normal posture. Extremities:  Without edema. Neurologic:  Alert and  oriented x4 Psych:  Alert and cooperative. Normal mood and affect.

## 2018-10-27 NOTE — Telephone Encounter (Signed)
LMOM to call.

## 2018-10-27 NOTE — Telephone Encounter (Signed)
Please call pt. HER stomach Bx shows gastritis.  HER SMALL BOWEL BIOPSIES ARE NORMAL.  HER HB IS IMPROVED SINCE JUL 2020.   COMPLETE WORKUP FOR ELEVATED LIVER ENZYMES. WE WILL REVIEW THE TCS REPORT FROM [facility]. NO ADDITIONAL WORKUP NEEDED AT THIS TIME FOR ANEMIA.

## 2018-10-28 ENCOUNTER — Encounter: Payer: Self-pay | Admitting: Gastroenterology

## 2018-10-28 ENCOUNTER — Telehealth: Payer: Self-pay | Admitting: *Deleted

## 2018-10-28 DIAGNOSIS — F329 Major depressive disorder, single episode, unspecified: Secondary | ICD-10-CM

## 2018-10-28 DIAGNOSIS — F32A Depression, unspecified: Secondary | ICD-10-CM

## 2018-10-28 DIAGNOSIS — J441 Chronic obstructive pulmonary disease with (acute) exacerbation: Secondary | ICD-10-CM | POA: Diagnosis not present

## 2018-10-28 NOTE — Telephone Encounter (Signed)
PT is aware of results and plan.  

## 2018-10-28 NOTE — Telephone Encounter (Signed)
Copied from [place] [id number]. Topic: Referral - Status >> Oct 28, 2018 12:46 PM Scherrie Gerlach wrote: Reason for CRM: pt states the behavioral health dr you referred her to in [place] is not accepting new pt. She called her insurance, and these drs are on her list. Dr Albertine Patricia @   [place]  [phone]      Dr. Norma Fredrickson, pt will also call a few more they gave her, to search for someone closer to her home

## 2018-10-28 NOTE — Telephone Encounter (Signed)
Ok to resend referral to those providers

## 2018-10-28 NOTE — Telephone Encounter (Signed)
Referral placed.

## 2018-10-29 ENCOUNTER — Other Ambulatory Visit: Payer: Self-pay

## 2018-10-29 ENCOUNTER — Encounter: Payer: Self-pay | Admitting: Physical Medicine and Rehabilitation

## 2018-10-29 ENCOUNTER — Encounter
Payer: Medicare Other | Attending: Physical Medicine and Rehabilitation | Admitting: Physical Medicine and Rehabilitation

## 2018-10-29 VITALS — BP 121/69 | HR 95 | Temp 97.7°F | Ht 62.0 in | Wt 120.0 lb

## 2018-10-29 DIAGNOSIS — Z5181 Encounter for therapeutic drug level monitoring: Secondary | ICD-10-CM | POA: Diagnosis not present

## 2018-10-29 DIAGNOSIS — Z79891 Long term (current) use of opiate analgesic: Secondary | ICD-10-CM

## 2018-10-29 DIAGNOSIS — G8929 Other chronic pain: Secondary | ICD-10-CM | POA: Insufficient documentation

## 2018-10-29 DIAGNOSIS — M5116 Intervertebral disc disorders with radiculopathy, lumbar region: Secondary | ICD-10-CM | POA: Insufficient documentation

## 2018-10-29 DIAGNOSIS — M5432 Sciatica, left side: Secondary | ICD-10-CM | POA: Diagnosis not present

## 2018-10-29 DIAGNOSIS — M5416 Radiculopathy, lumbar region: Secondary | ICD-10-CM | POA: Insufficient documentation

## 2018-10-29 MED ORDER — PREGABALIN 50 MG PO CAPS: 50.0000 mg | ORAL_CAPSULE | Freq: Two times a day (BID) | ORAL | 2 refills | Status: DC

## 2018-10-29 NOTE — Assessment & Plan Note (Signed)
Increase PPI to BID.  

## 2018-10-29 NOTE — Patient Instructions (Signed)
1. Pt is a 63 yr old female with Sciatica and chronic low back pain/lumbar radiculopathy s/p 3 lumbar surgeries in 1990s.  - Sciatica/lumbar radiculopathy- is on Duloxetine 60 mg BID-Never tried Lyrica- is concerned that might be abused- Lyrica 50 mg BID - can titrate up in future.   - Chronic low back pain- needs pain contract and oral drug screen to be done. Then can write for pain meds. Was supposed to run out tomorrow of oxycodone- 12 pills left- mad at her since changed doctors  - Could benefit from Lidocaine patches- allergic to adhesive- makes her break out. Cream also breaks her out per pt. (has very sensitive sin- can't even use Neutrogena)  - Will have pt go to psychiatry to get Rx for Alprazolam.   - F/U in 1 month

## 2018-10-29 NOTE — Assessment & Plan Note (Signed)
Likely due to ischemic hepatitis during hospitalization. Recheck now. Return in 3 months.

## 2018-10-29 NOTE — Progress Notes (Signed)
Subjective:    Patient ID: [name], female    DOB: Jul 18, 1955, 63 y.o.   MRN: [id number]  HPI   CC: Chronic back pain  Pt is a 63 yr old R handed female likes to be caused [place] with COPD on O2, CHF- doesn't know EF, just got out of hospital in 08/2018- due to O2 level dropping (and got put on O2 all the time at that hospitalization)  s/p 3 lumbar surgeries- doesn't know which levels had done. Here for evaluation of back pain.   Pain is more to the left but starts in midline and goes to L- and radiates down the back of leg to foot- when foot goes numb a lot. Sometimes it feels stabbing; mainly feels like someone is stabbing her and rips skin off- and then radiates/shoots down L leg. After a while, L leg feels work. Pain is always there; weakness comes and goes.   Has had epidural injections- weren't very helpful.  Takes Oxycodone- 5mg  in the morning, takes 2 tabs; gets 80 pills/month- some days, takes zero to 1 tab-   Made worse- Can't sit, stand for a long period of time- can't stay in 1 spot for a long time- stopped walking dog 2 miles/day before got sick in July 2020. Helps- Can walk- that's how gets her relief without meds; oxycodone is also very helpful.  On remeron for sleep now. Most sleeping pills wake her up- Gets Xanax 0.5 mg TID prn- takes some for sleep as well.   On Duloxetine 60 mg BID- for depression; actually can hepl with nerve pain.     Social Hx: Grandkids and her like to go fishing. COVID and being sick has called a halt to all of that. Was happy- had kids and grandkids unde roof for last birthday. Husband- got remarried to husband (EtOH played a part in divorce). Stopped smoking (husband still smokes 2ppd)- occ smokes 1/2 of cigarette- 1 pack will last all week- slowly weaning it on her own. Doesn't drink; no illicits.     Pain Inventory Average Pain 7 Pain Right Now 5 My pain is sharp and burning  In the last 24 hours, has pain interfered  with the following? General activity 6 Relation with others 4 Enjoyment of life 4 What TIME of day is your pain at its worst? daytime Sleep (in general) Fair  Pain is worse with: sitting, standing and some activites Pain improves with: pacing activities and medication Relief from Meds: 8  Mobility walk without assistance walk with assistance ability to climb steps?  yes do you drive?  yes  Function disabled: date disabled 2002  Neuro/Psych weakness numbness tingling depression anxiety  Prior Studies Any changes since last visit?  no  Physicians involved in your care Any changes since last visit?  no   Family History  Problem Relation Age of Onset  . Colon polyps Mother   . Deep vein thrombosis Mother   . Heart disease Mother   . Hypertension Mother   . Bleeding Disorder Mother   . Hypertension Sister   . Heart disease Brother        before age 69  . Hypertension Brother    Social History   Socioeconomic History  . Marital status: Married    Spouse name: Not on file  . Number of children: 2  . Years of education: Not on file  . Highest education level: Not on file  Occupational History    Comment: Disabled  Social Needs  . Financial resource strain: Not on file  . Food insecurity    Worry: Not on file    Inability: Not on file  . Transportation needs    Medical: Not on file    Non-medical: Not on file  Tobacco Use  . Smoking status: Current Every Day Smoker    Packs/day: 0.25    Years: 40.00    Pack years: 10.00    Types: Cigarettes    Start date: 09/06/1972  . Smokeless tobacco: Never Used  Substance and Sexual Activity  . Alcohol use: No    Alcohol/week: 0.0 standard drinks  . Drug use: No  . Sexual activity: Not Currently    Birth control/protection: Surgical  Lifestyle  . Physical activity    Days per week: Not on file    Minutes per session: Not on file  . Stress: Not on file  Relationships  . Social Herbalist on phone:  Not on file    Gets together: Not on file    Attends religious service: Not on file    Active member of club or organization: Not on file    Attends meetings of clubs or organizations: Not on file    Relationship status: Not on file  Other Topics Concern  . Not on file  Social History Narrative  . Not on file   Past Surgical History:  Procedure Laterality Date  . ABDOMINAL HYSTERECTOMY     without oophorectomy for neoplastic disease  . BACK SURGERY    . BIOPSY  10/22/2018   Procedure: BIOPSY;  Surgeon: Danie Binder, MD;  [facility];  Service: Endoscopy;;  duodenal, gastric  . CARDIAC CATHETERIZATION  07/2013   normal coronary arteries  . CARPAL TUNNEL RELEASE     Left  . CHOLECYSTECTOMY  2007  . DILATION AND CURETTAGE OF UTERUS    . ESOPHAGOGASTRODUODENOSCOPY (EGD) WITH PROPOFOL N/A 10/22/2018   gastritis (mild reactive gastropathy, negative duodenal biopsies for celiac).   Marland Kitchen LAPAROTOMY    . LEFT HEART CATHETERIZATION WITH CORONARY ANGIOGRAM N/A 07/12/2013   Procedure: LEFT HEART CATHETERIZATION WITH CORONARY ANGIOGRAM;  Surgeon: Leonie Man, MD;  Location: [facility] CATH LAB;  Service: Cardiovascular;  Laterality: N/A;  . LUMBAR LAMINECTOMY  x3   Left; complicated by neurologic dysfunction  . NASAL SINUS SURGERY     Past Medical History:  Diagnosis Date  . Anemia   . Anxiety and depression   . Bronchitis   . Cancer ([place])    uterus  . CHF (congestive heart failure) ([place])   . Chronic pain 11/22/2010  . COPD (chronic obstructive pulmonary disease) ([place])   . Coronary artery disease   . Gastroesophageal reflux disease 11/22/2010  . Hyperlipidemia   . Hypertension   . Hypothyroid   . Migraine headache 11/22/2010  . Near syncope   . On home oxygen therapy    started 08/2018  . Peripheral vascular disease ([place])   . Pneumonia   . Tobacco abuse    1/2 pack per day   BP 121/69   Pulse 95   Temp 97.7 F (36.5 C)   Ht 5\' 2"  (1.575 m)   Wt 120 lb (54.4 kg)    SpO2 98% Comment: O2 via nasal can at 2ltrs  BMI 21.95 kg/m   Opioid Risk Score:   Fall Risk Score:  `1  Depression screen PHQ 2/9  Depression screen PHQ 2/9 09/17/2018  Decreased Interest 0  Down,  Depressed, Hopeless 0  PHQ - 2 Score 0  Altered sleeping 0  Tired, decreased energy 0  Change in appetite 1  Feeling bad or failure about yourself  0  Trouble concentrating 0  Moving slowly or fidgety/restless 0  Suicidal thoughts 0  PHQ-9 Score 1  Difficult doing work/chores Not difficult at all     Review of Systems  Constitutional: Negative.   HENT: Negative.   Eyes: Negative.   Respiratory: Negative.   Cardiovascular: Negative.   Gastrointestinal: Negative.   Endocrine: Negative.   Genitourinary: Negative.   Musculoskeletal: Positive for arthralgias, back pain and myalgias.  Skin: Negative.   Allergic/Immunologic: Negative.   Neurological: Negative.   Hematological: Negative.   Psychiatric/Behavioral: Positive for dysphoric mood. The patient is nervous/anxious.   All other systems reviewed and are negative. mucinex D- to help post nasal drip since on O2. No significant constipation; no bowel/bladder accidents Takes cranberry  Tabs when feels like will have UTI- takes care of it.    Objective:   Physical Exam Awake, alert, appropriate, hoarse/gravely voice; on O2 by [place], on O2 tank, L leg crossed over R leg- shaking; NAD MS: UEs 5/5 B/L except L grip 4/5  LEs- RLE 5/5; LLE HF 4-/5, KE 4-/5, DF 4+/5, PF 4-/5, EHL 3+/5 hyerextension worse on Le than R Very TTP over L lumbar paraspinals and midline L2-L5 area Trigger points in B/L thoracic paraspinals ~ T5 down to lumbar, but worst pain L2/L5 on L SLR (+) on L; not on R Lumbar flexion made pain worse in L lumbar spine  Neuro- DTRs 3+ in UEs; slightly more brisk in LEs 3-4+ in LEs Sensation decreased in LLE L1-S1; intact otherwise except L3 rd finger/digit RRR-  CTA B/L- a few coarse breath sounds, but overall, just  upper airway sounds.  abd soft, NT, ND, (+)BS    Assessment & Plan:    1. Pt is a 63 yr old female with Sciatica and chronic low back pain/lumbar radiculopathy s/p 3 lumbar surgeries in 1990s.  - Sciatica/lumbar radiculopathy- is on Duloxetine 60 mg BID-Never tried Lyrica- is concerned that might be abused- Lyrica 50 mg BID - can titrate up in future.   - Chronic low back pain- needs pain contract and oral drug screen to be done. Then can write for pain meds. Was supposed to run out tomorrow of oxycodone- 12 pills left- mad at her since changed doctors  - Could benefit from Lidocaine patches- allergic to adhesive- makes her break out. Cream also breaks her out per pt. (has very sensitive sin- can't even use Neutrogena)  - Will have pt go to psychiatry to get Rx for Alprazolam.    - F/U in 1 month  I spent a total of 45 minutes on appointment- more than 30 minutes discussing  Options for pain control

## 2018-10-29 NOTE — Assessment & Plan Note (Addendum)
Multifactorial in setting of chronic disease, possible evolving IDA, EGD with gastritis and negative celiac. Hgb improved s/p hospitalization. Awaiting colonoscopy reports from [facility]; however, she is declining any further colonoscopies at this time. Will follow clinically. Return in 3 months.   Attempted to retreive colonoscopy report. No records per [facility].

## 2018-11-02 LAB — DRUG TOX MONITOR 1 W/CONF, ORAL FLD
Alprazolam: 9.42 ng/mL — ABNORMAL HIGH (ref <0.50)
Amphetamines: NEGATIVE ng/mL (ref <10)
Barbiturates: NEGATIVE ng/mL (ref <10)
Benzodiazepines: POSITIVE ng/mL — AB (ref <0.50)
Buprenorphine: NEGATIVE ng/mL (ref <0.10)
Chlordiazepoxide: NEGATIVE ng/mL (ref <0.50)
Clonazepam: NEGATIVE ng/mL (ref <0.50)
Cocaine: NEGATIVE ng/mL (ref <5.0)
Codeine: NEGATIVE ng/mL (ref <2.5)
Cotinine: 175.7 ng/mL — ABNORMAL HIGH (ref <5.0)
Diazepam: NEGATIVE ng/mL (ref <0.50)
Dihydrocodeine: NEGATIVE ng/mL (ref <2.5)
Fentanyl: NEGATIVE ng/mL (ref <0.10)
Flunitrazepam: NEGATIVE ng/mL (ref <0.50)
Flurazepam: NEGATIVE ng/mL (ref <0.50)
Heroin Metabolite: NEGATIVE ng/mL (ref <1.0)
Hydrocodone: NEGATIVE ng/mL (ref <2.5)
Hydromorphone: NEGATIVE ng/mL (ref <2.5)
Lorazepam: NEGATIVE ng/mL (ref <0.50)
MARIJUANA: NEGATIVE ng/mL (ref <2.5)
MDMA: NEGATIVE ng/mL (ref <10)
Meprobamate: NEGATIVE ng/mL (ref <2.5)
Methadone: NEGATIVE ng/mL (ref <5.0)
Midazolam: NEGATIVE ng/mL (ref <0.50)
Morphine: NEGATIVE ng/mL (ref <2.5)
Nicotine Metabolite: POSITIVE ng/mL — AB (ref <5.0)
Nordiazepam: NEGATIVE ng/mL (ref <0.50)
Norhydrocodone: NEGATIVE ng/mL (ref <2.5)
Noroxycodone: 77.6 ng/mL — ABNORMAL HIGH (ref <2.5)
Opiates: POSITIVE ng/mL — AB (ref <2.5)
Oxazepam: NEGATIVE ng/mL (ref <0.50)
Oxycodone: >250 ng/mL — ABNORMAL HIGH (ref <2.5)
Oxymorphone: NEGATIVE ng/mL (ref <2.5)
Phencyclidine: NEGATIVE ng/mL (ref <10)
Tapentadol: NEGATIVE ng/mL (ref <5.0)
Temazepam: NEGATIVE ng/mL (ref <0.50)
Tramadol: NEGATIVE ng/mL (ref <5.0)
Triazolam: NEGATIVE ng/mL (ref <0.50)
Zolpidem: NEGATIVE ng/mL (ref <5.0)

## 2018-11-02 LAB — DRUG TOX ALC METAB W/CON, ORAL FLD: Alcohol Metabolite: NEGATIVE ng/mL (ref <25)

## 2018-11-03 ENCOUNTER — Telehealth: Payer: Self-pay | Admitting: *Deleted

## 2018-11-03 NOTE — Telephone Encounter (Signed)
Patients left a message that pregabalin, generic Lyrica, is make her body tingle all over. She contacted her pharmacy and they told her to only take one and contact us. Please advise. ALSO, she is asking about he drug screen results and asks what Dr. Dagoberto Ligas might be prescribing for her pain?

## 2018-11-04 ENCOUNTER — Telehealth: Payer: Self-pay | Admitting: *Deleted

## 2018-11-04 MED ORDER — OXYCODONE HCL 5 MG PO CAPS: 5.0000 mg | ORAL_CAPSULE | Freq: Three times a day (TID) | ORAL | 0 refills | Status: DC | PRN

## 2018-11-04 NOTE — Telephone Encounter (Signed)
Oral swab drug screen was consistent for prescribed medications.  ?

## 2018-11-04 NOTE — Telephone Encounter (Signed)
Lyrica was causing her body to tingle "all over" per note.  Tingling from head to toes- called pharmacist- was told to take 1 not 2- no change in effects.  Suggest for her to stop it- if Sx's stop, then know it's Lyrica.  2. Was taking Oxycodone 5 mg TID- Drug screen came back- is consistent- will prescribe it for her. - sent to her pharmacy for #90- no refills

## 2018-11-09 DIAGNOSIS — R945 Abnormal results of liver function studies: Secondary | ICD-10-CM | POA: Diagnosis not present

## 2018-11-10 LAB — HEPATIC FUNCTION PANEL
ALT: 10 IU/L (ref 0–32)
AST: 17 IU/L (ref 0–40)
Albumin: 4.2 g/dL (ref 3.8–4.8)
Alkaline Phosphatase: 83 IU/L (ref 39–117)
Bilirubin Total: 0.3 mg/dL (ref 0.0–1.2)
Bilirubin, Direct: 0.09 mg/dL (ref 0.00–0.40)
Total Protein: 6.1 g/dL (ref 6.0–8.5)

## 2018-11-17 NOTE — Progress Notes (Signed)
LFTs have returned to normal!

## 2018-11-19 ENCOUNTER — Other Ambulatory Visit: Payer: Self-pay

## 2018-11-19 ENCOUNTER — Ambulatory Visit (INDEPENDENT_AMBULATORY_CARE_PROVIDER_SITE_OTHER): Payer: Medicare Other | Admitting: Internal Medicine

## 2018-11-19 ENCOUNTER — Encounter: Payer: Self-pay | Admitting: Internal Medicine

## 2018-11-19 VITALS — BP 130/78 | HR 90 | Temp 98.0°F | Wt 124.3 lb

## 2018-11-19 DIAGNOSIS — I10 Essential (primary) hypertension: Secondary | ICD-10-CM | POA: Diagnosis not present

## 2018-11-19 DIAGNOSIS — Z720 Tobacco use: Secondary | ICD-10-CM | POA: Diagnosis not present

## 2018-11-19 DIAGNOSIS — E785 Hyperlipidemia, unspecified: Secondary | ICD-10-CM

## 2018-11-19 DIAGNOSIS — Z23 Encounter for immunization: Secondary | ICD-10-CM | POA: Diagnosis not present

## 2018-11-19 DIAGNOSIS — F329 Major depressive disorder, single episode, unspecified: Secondary | ICD-10-CM

## 2018-11-19 DIAGNOSIS — J441 Chronic obstructive pulmonary disease with (acute) exacerbation: Secondary | ICD-10-CM | POA: Diagnosis not present

## 2018-11-19 DIAGNOSIS — F419 Anxiety disorder, unspecified: Secondary | ICD-10-CM | POA: Diagnosis not present

## 2018-11-19 DIAGNOSIS — K921 Melena: Secondary | ICD-10-CM

## 2018-11-19 DIAGNOSIS — M5416 Radiculopathy, lumbar region: Secondary | ICD-10-CM

## 2018-11-19 DIAGNOSIS — E039 Hypothyroidism, unspecified: Secondary | ICD-10-CM

## 2018-11-19 MED ORDER — ALPRAZOLAM 0.5 MG PO TABS: 0.5000 mg | ORAL_TABLET | Freq: Three times a day (TID) | ORAL | 0 refills | Status: AC | PRN

## 2018-11-19 NOTE — Patient Instructions (Signed)
-  Nice seeing you today!!  -Will follow up on pulmonary and psychiatry referrals.  -Schedule follow up in 4 months.

## 2018-11-19 NOTE — Progress Notes (Signed)
Established Patient Office Visit     CC/Reason for Visit: Follow-up chronic conditions  HPI: [name] is a 63 y.o. female who is coming in today for the above mentioned reasons.  Past medical history is extensive and detailed on prior note.  Since I last saw her she followed up with GI due to the melena that she experienced while hospitalized.  She refuses colonoscopies and they have just going to observe her for now.  She states she has not noticed any further blood in her stools.  She is also requesting a prescription for a portable oxygen tank delivered to [facility].  At last visit we referred her to a pain clinic, to pulmonary into psychiatry.  She was able to get into a pain clinic, however psychiatry stated they were not taking new patients and pulmonary is behind in the new patient appointments.  She has not run out of her Xanax and is asking if I can temporarily fill it for her.  She has been shaky without it.  Past Medical/Surgical History: Past Medical History:  Diagnosis Date  . Anemia   . Anxiety and depression   . Bronchitis   . Cancer ([place])    uterus  . CHF (congestive heart failure) ([place])   . Chronic pain 11/22/2010  . COPD (chronic obstructive pulmonary disease) ([place])   . Coronary artery disease   . Gastroesophageal reflux disease 11/22/2010  . Hyperlipidemia   . Hypertension   . Hypothyroid   . Migraine headache 11/22/2010  . Near syncope   . On home oxygen therapy    started 08/2018  . Peripheral vascular disease ([place])   . Pneumonia   . Tobacco abuse    1/2 pack per day    Past Surgical History:  Procedure Laterality Date  . ABDOMINAL HYSTERECTOMY     without oophorectomy for neoplastic disease  . BACK SURGERY    . BIOPSY  10/22/2018   Procedure: BIOPSY;  Surgeon: Danie Binder, MD;  Location: [facility];  Service: Endoscopy;;  duodenal, gastric  . CARDIAC CATHETERIZATION  07/2013   normal coronary arteries  . CARPAL TUNNEL  RELEASE     Left  . CHOLECYSTECTOMY  2007  . DILATION AND CURETTAGE OF UTERUS    . ESOPHAGOGASTRODUODENOSCOPY (EGD) WITH PROPOFOL N/A 10/22/2018   gastritis (mild reactive gastropathy, negative duodenal biopsies for celiac).   Marland Kitchen LAPAROTOMY    . LEFT HEART CATHETERIZATION WITH CORONARY ANGIOGRAM N/A 07/12/2013   Procedure: LEFT HEART CATHETERIZATION WITH CORONARY ANGIOGRAM;  Surgeon: Leonie Man, MD;  Location: [facility] CATH LAB;  Service: Cardiovascular;  Laterality: N/A;  . LUMBAR LAMINECTOMY  x3   Left; complicated by neurologic dysfunction  . NASAL SINUS SURGERY      Social History:  reports that she has been smoking cigarettes. She started smoking about 46 years ago. She has a 10.00 pack-year smoking history. She has never used smokeless tobacco. She reports that she does not drink alcohol or use drugs.  Allergies: Allergies  Allergen Reactions  . Acetaminophen-Codeine Nausea And Vomiting  . Aspirin Nausea And Vomiting    (can take coated aspirin)  . Celecoxib Other (See Comments)    "does the opposite of what it is suppose to do"  . Levaquin [Levofloxacin In D5w] Itching  . Lipitor [Atorvastatin] Other (See Comments)    Myalgias   . Sulfa Antibiotics Nausea And Vomiting  . Adhesive [Tape] Rash  . Latex Rash  Family History:  Family History  Problem Relation Age of Onset  . Colon polyps Mother   . Deep vein thrombosis Mother   . Heart disease Mother   . Hypertension Mother   . Bleeding Disorder Mother   . Hypertension Sister   . Heart disease Brother        before age 19  . Hypertension Brother      Current Outpatient Medications:  .  albuterol (PROAIR HFA) 108 (90 Base) MCG/ACT inhaler, Inhale 2 puffs into the lungs every 4 (four) hours as needed for wheezing or shortness of breath (cough)., Disp: 18 g, Rfl: 1 .  albuterol (PROVENTIL) (2.5 MG/3ML) 0.083% nebulizer solution, Take 3 mLs (2.5 mg total) by nebulization every 4 (four) hours as needed for wheezing or  shortness of breath (cough)., Disp: 75 mL, Rfl: 12 .  ALPRAZolam (XANAX) 0.5 MG tablet, Take 1 tablet (0.5 mg total) by mouth 3 (three) times daily as needed for up to 15 days for anxiety., Disp: 30 tablet, Rfl: 0 .  aspirin EC 81 MG tablet, Take 81 mg by mouth daily., Disp: , Rfl:  .  budesonide-formoterol (SYMBICORT) 80-4.5 MCG/ACT inhaler, Inhale 2 puffs into the lungs 2 (two) times daily., Disp: , Rfl:  .  DULoxetine (CYMBALTA) 60 MG capsule, Take 1 capsule (60 mg total) by mouth 2 (two) times daily., Disp: 60 capsule, Rfl: 3 .  fluticasone (FLONASE) 50 MCG/ACT nasal spray, Place 1-2 sprays into both nostrils daily as needed for allergies. , Disp: , Rfl:  .  furosemide (LASIX) 20 MG tablet, Take 10 mg by mouth daily as needed for edema., Disp: , Rfl:  .  levothyroxine (LEVOTHROID) 25 MCG tablet, Take 1 tablet (25 mcg total) by mouth every morning., Disp: 30 tablet, Rfl: 3 .  mirtazapine (REMERON) 45 MG tablet, Take 45 mg by mouth at bedtime., Disp: , Rfl:  .  oxycodone (OXY-IR) 5 MG capsule, Take 1 capsule (5 mg total) by mouth 3 (three) times daily as needed., Disp: 90 capsule, Rfl: 0 .  pantoprazole (PROTONIX) 40 MG tablet, Take 1 tablet (40 mg total) by mouth 2 (two) times daily before a meal., Disp: 180 tablet, Rfl: 3 .  pregabalin (LYRICA) 50 MG capsule, Take 1 capsule (50 mg total) by mouth 2 (two) times daily., Disp: 90 capsule, Rfl: 2 .  pseudoephedrine-guaifenesin (MUCINEX D) 60-600 MG 12 hr tablet, Take 1 tablet by mouth daily., Disp: , Rfl:  .  rosuvastatin (CRESTOR) 40 MG tablet, Take 40 mg by mouth at bedtime. , Disp: , Rfl:   Review of Systems:  Constitutional: Denies fever, chills, diaphoresis, appetite change and fatigue.  HEENT: Denies photophobia, eye pain, redness, hearing loss, ear pain, congestion, sore throat, rhinorrhea, sneezing, mouth sores, trouble swallowing, neck pain, neck stiffness and tinnitus.   Respiratory: Denies SOB, DOE, cough, chest tightness,  and wheezing.    Cardiovascular: Denies chest pain, palpitations and leg swelling.  Gastrointestinal: Denies nausea, vomiting, abdominal pain, diarrhea, constipation, blood in stool and abdominal distention.  Genitourinary: Denies dysuria, urgency, frequency, hematuria, flank pain and difficulty urinating.  Endocrine: Denies: hot or cold intolerance, sweats, changes in hair or nails, polyuria, polydipsia. Musculoskeletal: Denies myalgias, back pain, joint swelling, arthralgias and gait problem.  Skin: Denies pallor, rash and wound.  Neurological: Denies dizziness, seizures, syncope, weakness, light-headedness, numbness and headaches.  Hematological: Denies adenopathy. Easy bruising, personal or family bleeding history  Psychiatric/Behavioral: Denies suicidal ideation, mood changes, confusion, nervousness, sleep disturbance and agitation  Physical Exam: Vitals:   11/19/18 1259  BP: 130/78  Pulse: 90  Temp: 98 F (36.7 C)  TempSrc: Temporal  SpO2: 96%  Weight: 124 lb 4.8 oz (56.4 kg)    Body mass index is 22.73 kg/m.   Constitutional: NAD, calm, comfortable Eyes: PERRL, lids and conjunctivae nor Respiratory: clear to auscultation bilaterally, no wheezing, no crackles. Normal respiratory effort. No accessory muscle use.  Cardiovascular: Regular rate and rhythm, no murmurs / rubs / gallops. No extremity edema. 2+ pedal pulses.  Abdomen: no tenderness, no masses palpated. No hepatosplenomegaly. Bowel sounds positive.  Musculoskeletal: no clubbing / cyanosis. No joint deformity upper and lower extremities. Good ROM, no contractures. Normal muscle tone.  Skin: no rashes, lesions, ulcers. No induration Neurologic: CN 2-12 grossly intact. Sensation intact, DTR normal. Strength 5/5 in all 4.  Psychiatric: Normal judgment and insight. Alert and oriented x 3. Normal mood.    Impression and Plan:  Tobacco abuse  -No time to address at today's visit  COPD exacerbation ([place])  -With chronic  hypoxemic respiratory failure on oxygen. -Will send prescription for portable oxygen tank. -Referral to pulmonary is in process and delayed on their end.  Anxiety and depression -We had referred her to psychiatry, however they were not accepting new patients. -Will follow-up on this referral today, it is imperative that she see psychiatry given her significant depression and multiple antidepressant medications. -Will allow 30-day tablets of Xanax as she has tried to get into psychiatry and has been able to through no fault of her own, have advised that I will not be willing to prescribe any more.  Essential hypertension -Well-controlled.  Hypothyroidism, unspecified type -Check TSH next visit, continue Synthroid  Chronic left lumbar radiculopathy -With chronic pain syndrome, on oxycodone through pain clinic.  Gastrointestinal hemorrhage with melena -Has already seen GI with plan for observation as she has refused further colonoscopies.  Hyperlipidemia, unspecified hyperlipidemia type -No LDL on file, continue Crestor.    Patient Instructions  -Nice seeing you today!!  -Will follow up on pulmonary and psychiatry referrals.  -Schedule follow up in 4 months.     Lelon Frohlich, MD [facility] at [facility]

## 2018-11-20 ENCOUNTER — Telehealth: Payer: Self-pay | Admitting: *Deleted

## 2018-11-20 NOTE — Telephone Encounter (Signed)
Order, patient's information, office notes faxed

## 2018-11-20 NOTE — Telephone Encounter (Signed)
Copied from [place] [phone]. Topic: General - Inquiry >> Nov 19, 2018  4:25 PM Alease Frame wrote: Reason for CRM: Patient would like for Dr Jerilee Hoh to fax over info to [facility]  Call back number [phone]

## 2018-11-24 ENCOUNTER — Telehealth: Payer: Self-pay

## 2018-11-24 NOTE — Telephone Encounter (Signed)
Copied from [place] [phone]. Topic: General - Other >> Nov 24, 2018  2:45 PM Lennox Solders wrote: Reason for CRM: appey with [employer] is calling checking on the status of CMN form that was faxed on 11-16-2018. Appey will refax the form

## 2018-11-25 NOTE — Telephone Encounter (Signed)
Form has not been received.

## 2018-11-26 ENCOUNTER — Encounter: Payer: Medicare Other | Admitting: Physical Medicine and Rehabilitation

## 2018-11-26 NOTE — Telephone Encounter (Signed)
Placed in Dr Hernandez's folder 

## 2018-11-27 DIAGNOSIS — J441 Chronic obstructive pulmonary disease with (acute) exacerbation: Secondary | ICD-10-CM | POA: Diagnosis not present

## 2018-12-03 ENCOUNTER — Institutional Professional Consult (permissible substitution): Payer: Medicare Other | Admitting: Internal Medicine

## 2018-12-07 ENCOUNTER — Telehealth: Payer: Self-pay | Admitting: Internal Medicine

## 2018-12-07 NOTE — Telephone Encounter (Signed)
ALPRAZolam (XANAX) 0.5 MG tablet    Patient is requesting refill.    Pharmacy:  [facility], [place] [phone] (Phone) [phone] (Fax)

## 2018-12-08 ENCOUNTER — Ambulatory Visit: Payer: Medicare Other | Admitting: Internal Medicine

## 2018-12-08 ENCOUNTER — Other Ambulatory Visit: Payer: Self-pay

## 2018-12-08 ENCOUNTER — Ambulatory Visit (INDEPENDENT_AMBULATORY_CARE_PROVIDER_SITE_OTHER): Payer: Medicare Other

## 2018-12-08 ENCOUNTER — Encounter: Payer: Self-pay | Admitting: Internal Medicine

## 2018-12-08 DIAGNOSIS — F17210 Nicotine dependence, cigarettes, uncomplicated: Secondary | ICD-10-CM | POA: Diagnosis not present

## 2018-12-08 DIAGNOSIS — J449 Chronic obstructive pulmonary disease, unspecified: Secondary | ICD-10-CM | POA: Insufficient documentation

## 2018-12-08 DIAGNOSIS — J9611 Chronic respiratory failure with hypoxia: Secondary | ICD-10-CM

## 2018-12-08 DIAGNOSIS — J9612 Chronic respiratory failure with hypercapnia: Secondary | ICD-10-CM

## 2018-12-08 DIAGNOSIS — I509 Heart failure, unspecified: Secondary | ICD-10-CM | POA: Diagnosis not present

## 2018-12-08 MED ORDER — BUDESONIDE-FORMOTEROL FUMARATE 160-4.5 MCG/ACT IN AERO: INHALATION_SPRAY | RESPIRATORY_TRACT | 11 refills | Status: DC

## 2018-12-08 NOTE — Assessment & Plan Note (Addendum)
D/c from [facility] 08/27/18 with HC03 30 on 3lpm  12/08/2018  Patient Saturations on Room Air at Rest = 94%, while Ambulating = 87% Patient Saturations on 3  Liters of pulsed oxygen while Ambulating = 95% - HC03  12/08/2018  = 41   She is hypercarbic so ok to titrate to keep sats > 90% and qualifies for POC at 3lpm pulsed today   Total time devoted to counseling  > 50 % of initial 60 min office visit:  reviewed case with pt/ directly observed portions of ambulatory 02 saturation study/ performed device teaching  using a teach back technique which also  extended face to face time for this visit (see above)  discussion of options/alternatives/ personally creating written customized instructions  in presence of pt  then going over those specific  Instructions directly with the pt including how to use all of the meds but in particular covering each new medication in detail and the difference between the maintenance= "automatic" meds and the prns using an action plan format for the latter (If this problem/symptom => do that organization reading Left to right).  Please see AVS from this visit for a full list of these instructions which I personally wrote for this pt and  are unique to this visit.

## 2018-12-08 NOTE — Assessment & Plan Note (Signed)
Active smoker - Spirometry 12/08/2018  FEV1 1.28 (53%)  Ratio 0.58 classic concave contour to f/v loop  - 12/08/2018  After extensive coaching inhaler device,  effectiveness =    75% try increase symb to 80 2bid   -  Alpha one AT screen 12/08/2018    DDX of  difficult airways management almost all start with A and  include Adherence, Ace Inhibitors, Acid Reflux, Active Sinus Disease, Alpha 1 Antitripsin deficiency, Anxiety masquerading as Airways dz,  ABPA,  Allergy(esp in young), Aspiration (esp in elderly), Adverse effects of meds,  Active smoking or vaping, A bunch of PE's (a small clot burden can't cause this syndrome unless there is already severe underlying pulm or vascular dz with poor reserve) plus two Bs  = Bronchiectasis and Beta blocker use..and one C= CHF    Adherence is always the initial "prime suspect" and is a multilayered concern that requires a "trust but verify" approach in every patient - starting with knowing how to use medications, especially inhalers, correctly, keeping up with refills and understanding the fundamental difference between maintenance and prns vs those medications only taken for a very short course and then stopped and not refilled.  - see hfa teaching - return with all meds in hand using a trust but verify approach to confirm accurate Medication  Reconciliation The principal here is that until we are certain that the  patients are doing what we've asked, it makes no sense to ask them to do more.   Active smoking greatest concern > see sep a/p  ? Acid (or non-acid) GERD > always difficult to exclude as up to 75% of pts in some series report no assoc GI/ Heartburn symptoms> rec continue max (24h)  acid suppression and diet restrictions/ reviewed     ? Allergy/ asthmatic component > max ICS/ more regular use will help  ? Alpha one at def > send level   ? Anxiety > usually at the bottom of this list of usual suspects but should be included on this pt's based on H  and P and note already on psychotropics and may interfere with ability to quit smoking  adherence and also interpretation of response or lack thereof to symptom management which can be quite subjective.   ? Chf > echo with [facility] only 08/25/2018   >>> f/u in 4 weeks, sooner prn

## 2018-12-08 NOTE — Progress Notes (Signed)
[name], female    DOB: 1955-05-16      MRN: [id number]   Brief patient profile:  26 yowf active smoker with onset somewhat limited by both breathing and back/ legs x 2017  With GOLD II criteria by spirometry  07/17/15   rx with proair avg sev times a week at most no maint rx then acutely ill for a few days weakness > sob  before admitted [facility]  With new acute resp failure/ severe hypoxemia.      Admission date:  08/24/2018  Admitting Physician  Bethena Roys, MD  Discharge Date:  08/27/2018   Primary MD  Jani Gravel, MD  Recommendations for primary care physician for things to follow:   1)Very low-salt diet advised 2)Weigh yourself daily, call if you gain more than 3 pounds in 1 day or more than 5 pounds in 1 week as your diuretic medications may need to be adjusted 3)Limit your Fluid  intake to no more than 60 ounces (1.8 Liters) per day 4)Follow -up with gastroenterologist Dr. Barney Drain at Address: [address] [place], [place] [address] Phone: [phone]---- most likely need upper endoscopy and colonoscopy 5)Avoid ibuprofen/Advil/Aleve/Motrin/Goody Powders/Naproxen/BC powders/Meloxicam/Diclofenac/Indomethacin and other Nonsteroidal anti-inflammatory medications as these will make you more likely to bleed and can cause stomach ulcers, can also cause Kidney problems.  6) you need repeat CBC and BMP blood test around Tuesday, 09/01/2018 7) absolutely no smoking around oxygen--this related to fire, injury and Death   Admission Diagnosis  Hypokalemia [E87.6] Pleural effusion [J90] Hypoxia [R09.02] AKI (acute kidney injury) ([place]) [N17.9] Gastrointestinal hemorrhage, unspecified gastrointestinal hemorrhage type [K92.2] Anemia, unspecified type [D64.9] Depression, unspecified depression type [F32.9] Community acquired pneumonia of right lung, unspecified part of lung [J18.9]   Discharge Diagnosis  Hypokalemia [E87.6] Pleural effusion [J90] Hypoxia [R09.02]  AKI (acute kidney injury) ([place]) [N17.9] Gastrointestinal hemorrhage, unspecified gastrointestinal hemorrhage type [K92.2] Anemia, unspecified type [D64.9] Depression, unspecified depression type [F32.9] Community acquired pneumonia of right lung, unspecified part of lung [J18.9]    Principal Problem:   Acute respiratory failure with hypoxia ([place]) Active Problems:   Anxiety and depression   HTN (hypertension)   COPD exacerbation ([facility])   Hypokalemia   Elevated LFTs   Anemia   Gastrointestinal hemorrhage          Past Medical History:  Diagnosis Date  . Anemia   . Anxiety and depression   . Bronchitis   . Cancer ([place])    uterus  . CHF (congestive heart failure) ([place])   . Chronic pain 11/22/2010  . COPD (chronic obstructive pulmonary disease) ([place])   . Gastroesophageal reflux disease 11/22/2010  . Hyperlipidemia   . Hypertension   . Hypothyroid   . Migraine headache 11/22/2010  . Near syncope   . Peripheral vascular disease ([place])   . Pneumonia   . Tobacco abuse    1/2 pack per day         Past Surgical History:  Procedure Laterality Date  . ABDOMINAL HYSTERECTOMY     without oophorectomy for neoplastic disease  . BACK SURGERY    . CARDIAC CATHETERIZATION  07/2013   normal coronary arteries  . CARPAL TUNNEL RELEASE     Left  . CHOLECYSTECTOMY  2007  . DILATION AND CURETTAGE OF UTERUS    . LAPAROTOMY    . LEFT HEART CATHETERIZATION WITH CORONARY ANGIOGRAM N/A 07/12/2013   Procedure: LEFT HEART CATHETERIZATION WITH CORONARY ANGIOGRAM;  Surgeon: Leonie Man, MD;  Location: [facility]  CATH LAB;  Service: Cardiovascular;  Laterality: N/A;  . LUMBAR LAMINECTOMY  x3   Left; complicated by neurologic dysfunction  . NASAL SINUS SURGERY       HPI  from the history and physical done on the day of admission:     Patient coming from:Home  I have personally briefly reviewed patient's old medical records in [facility]  Chief  Complaint:Weakness  [id number] L [name] a 63 y.o.femalewith medical history significant forCHF, hypothyroidism, peripheral vascular disease, COPD, depression anxiety, hypertension, subclavian steal syndrome, CVA. At the time of my evaluation patient is alert and oriented,able to give me a history. Who presented to the ED with complaints of generalized weakness of 2 weeks,and confusion at night. Patient reports over the past 2 weeks she has barely eatenor drank anything that she has felt so depressed. No abdominal pain, no vomiting, she reports some occasional loose stools. She reports chronic difficulty breathing,but is unable to tell me if this has changed recently. She denies cough. No chest pain. No fever or chills. She reports bilateral lower extremity swelling of about 2 weeks duration now. She reports her normal weight is about 115pounds. She denies being on any fluid pills. She denies vomiting of blood, denies black stools or bloody stools. She denies NSAID use. She is unaware of a history of heart disease or stroke,though both of these are listed on her medical problems. Patient is supposed to be on CPAP at night and home oxygen,both of which she has not used in at least 4 years. Per patient spouse-he has been seeing blood in patient's underwear,and patient has been admitted to [facility] for same.  ED Course:Temperature 98. Respiratory rate 17-24, blood pressure systolic 1 1 7-1 2 9, O2 sats 68% on room air. Patient was initially lethargic and somewhat confused, this improved after patient was placed on 2 L nasal cannula she became more alert and oriented. After placing patient on O2 by nasal cannula, patient's been BNP elevated at 1260.Mild leukocytosis 12.4. Low potassium 2.6. Elevated creatinine 2.47. Elevated bicarb 33. Hemoglobin 7.9. EKG without significant change from prior. Stool occult positive. Portable chest x-ray shows interval  cardiomegaly and moderate-sized right pleural effusion with right basilar atelectasis or pneumonia. Patient started on IV ceftriaxone and azithromycin. Hospitalist to admit for community-acquired pneumonia.     Hospital Course:       A/p Acute respiratory failure with hypoxia-with reported dyspnea,---most likely due to pneumonia and CHF, patient continues to require supplemental oxygen, currently requiring 3 L via nasal cannula   HFpEF/dCHF history-decompensated. New 1+ pitting pedal edema bilaterally,marked elevated BNP 1260 ( was 67 fouryears ago)chest x-ray with moderate sized right pleural effusion, no echo on file.  PTA was Not on diuretics. She is unaware if she has gained weight. Denies chest pain. EKG unchanged.  -Diuresed well with Lasix,.  Okay to discharge home on p.o. Lasix -Echocardiogram with LVEF 50-55%  Rt Sided Pleural Effusion--- status post ultrasound-guided thoracentesis with removal of 600 mL of serous fluid, fluid studies pending, postthoracentesis chest x-ray shows significant improvement in right-sided pleural effusion and right lung aeration  Acute kidney injury-creatininewas2.47,improving, creatinine is down to 1.51,Last available creatinine 4 years ago baseline 0.6 - 0.7.-----  Generalized weakness-likely multifactorial-overall improved,  Metabolic encephalopathy-likely from hypoxia/hypercapnia due to CPAP non-compliance,and probably pneumonia.  -Mental statuschanges have resolved, patient is back to baseline  Acuteiron deficiencyanemia-hemoglobinis up to 9.6 post transfusion of 2 units of packed cells,Last check 4 years ago baseline 11-12. Stool occult  positive.She denies black stools or blood loss, orabdominal pain.Newton Pigg use. NoColonoscopy or EGD on file. Per patient spouse-he has been seeing blood in patient's underwear,and patient has been admitted to [facility] for same. S/pFeraheme  transfusion  - CBC a.m -GI following with possible EGD soon and records being obtained from [facility] --GI consult appreciated and recommend outpatient EGD with Dr. Katharine Look fields  Tobacco abuse/COPD exacerbation/OSA--Hypoxia persist, smoking cessation advised, continue bronchodilators and steroids, treated with Rocephin---Continue CPAP and supplemental oxygen  Anxiety/depression- reports depressed feelings over the past 2 weeks,which resulted in poor p.o. intake. Denies suicidal or homicidal ideations. -TTSpsychiatry consultation appreciated, they recommend outpatient follow-up  Disposition--- patient repeatedly came down to the nursing station, demanding to be discharged home or she would leave AMA. --It took a lot of pleading for patient to agree to right-sided thoracentesis prior to discharge --She wanted to be discharged home As possible refused to stay for further treatment or interventions --She repeatedly threatening to leave AMA so she was reluctantly discharged home with home O2 and home health services          History of Present Illness  12/08/2018  Pulmonary/ 1st office eval/[facility]  GOLD II/ still smoking on symb 71 'now and then"  And 02 24/7  Chief Complaint  Patient presents with  . Pulmonary Consult    Referred by Dr Deniece Ree. Pt c/o SOB since July 2020. She states she was hospitalized with CHF then and started on o2 3lpm continuous o2.   Dyspnea:  Better  But can't walk the dog yet, better on 02 3lpm cont Cough: none  Sleep: no resp symptoms  flat  SABA use: less than usual / nebulizer twice weekly at most  0 2 3lpm 24/7 does not titrate though has pulse ox  No obvious day to day or daytime variability or assoc   purulent sputum or mucus plugs or hemoptysis or cp or chest tightness, subjective wheeze or overt sinus or hb symptoms.   Sleeping  without nocturnal  or early am exacerbation  of respiratory  c/o's or need for noct saba. Also denies any  obvious fluctuation of symptoms with weather or environmental changes or other aggravating or alleviating factors except as outlined above   No unusual exposure hx or h/o childhood pna/ asthma or knowledge of premature birth.  Current Allergies, Complete Past Medical History, Past Surgical History, Family History, and Social History were reviewed in [employer] record.  ROS  The following are not active complaints unless bolded Hoarseness, sore throat, dysphagia, dental problems, itching, sneezing,  nasal congestion or discharge of excess mucus or purulent secretions, ear ache,   fever, chills, sweats, unintended wt loss or wt gain, classically pleuritic or exertional cp,  orthopnea pnd or arm/hand swelling  or leg swelling, presyncope, palpitations, abdominal pain, anorexia, nausea, vomiting, diarrhea  or change in bowel habits or change in bladder habits, change in stools or change in urine, dysuria, hematuria,  rash, arthralgias back pain, visual complaints, headache, numbness, weakness or ataxia or problems with walking or coordination,  change in mood or  memory.              Past Medical History:  Diagnosis Date  . Anemia   . Anxiety and depression   . Bronchitis   . Cancer ([place])    uterus  . CHF (congestive heart failure) ([place])   . Chronic pain 11/22/2010  . COPD (chronic obstructive pulmonary disease) ([place])   . Coronary artery disease   .  Gastroesophageal reflux disease 11/22/2010  . Hyperlipidemia   . Hypertension   . Hypothyroid   . Migraine headache 11/22/2010  . Near syncope   . On home oxygen therapy    started 08/2018  . Peripheral vascular disease ([place])   . Pneumonia   . Tobacco abuse    1/2 pack per day    Outpatient Medications Prior to Visit  Medication Sig Dispense Refill  . albuterol (PROAIR HFA) 108 (90 Base) MCG/ACT inhaler Inhale 2 puffs into the lungs every 4 (four) hours as needed for wheezing or shortness of breath (cough). 18 g  1  . albuterol (PROVENTIL) (2.5 MG/3ML) 0.083% nebulizer solution Take 3 mLs (2.5 mg total) by nebulization every 4 (four) hours as needed for wheezing or shortness of breath (cough). 75 mL 12  . ALPRAZolam (XANAX) 0.5 MG tablet Take 0.5 mg by mouth 3 (three) times daily as needed for anxiety.    Marland Kitchen aspirin EC 81 MG tablet Take 81 mg by mouth daily.    . budesonide-formoterol (SYMBICORT) 80-4.5 MCG/ACT inhaler Inhale 2 puffs into the lungs 2 (two) times daily.    . DULoxetine (CYMBALTA) 60 MG capsule Take 1 capsule (60 mg total) by mouth 2 (two) times daily. 60 capsule 3  . fluticasone (FLONASE) 50 MCG/ACT nasal spray Place 1-2 sprays into both nostrils daily as needed for allergies.     . furosemide (LASIX) 20 MG tablet Take 10 mg by mouth daily as needed for edema.    Marland Kitchen levothyroxine (LEVOTHROID) 25 MCG tablet Take 1 tablet (25 mcg total) by mouth every morning. 30 tablet 3  . mirtazapine (REMERON) 45 MG tablet Take 45 mg by mouth at bedtime.    Marland Kitchen oxycodone (OXY-IR) 5 MG capsule Take 1 capsule (5 mg total) by mouth 3 (three) times daily as needed. 90 capsule 0  . pantoprazole (PROTONIX) 40 MG tablet Take 1 tablet (40 mg total) by mouth 2 (two) times daily before a meal. 180 tablet 3  . pregabalin (LYRICA) 50 MG capsule Take 1 capsule (50 mg total) by mouth 2 (two) times daily. 90 capsule 2  . pseudoephedrine-guaifenesin (MUCINEX D) 60-600 MG 12 hr tablet Take 1 tablet by mouth daily.    . rosuvastatin (CRESTOR) 40 MG tablet Take 40 mg by mouth at bedtime.         Objective:     BP 102/60 (BP Location: Left Arm, Cuff Size: Normal)   Pulse 71   Temp 97.6 F (36.4 C) (Temporal)   Ht 5\' 2"  (1.575 m)   Wt 127 lb (57.6 kg)   SpO2 96% Comment: 3lpm cont o2  BMI 23.23 kg/m   SpO2: 96 %(3lpm cont o2) O2 Type: Continuous O2 O2 Flow Rate (L/min): 3 L/min   Full dentures   HEENT : pt wearing mask not removed for exam due to covid - 19 concerns.    NECK :  without JVD/Nodes/TM/ nl  carotid upstrokes bilaterally   LUNGS: no acc muscle use,  Mild barrel  contour chest wall with bilateral  Mid exp wheeze and  without cough on insp or exp maneuvers  and mild  Hyperresonant  to  percussion bilaterally     CV:  RRR  no s3 or murmur or increase in P2, and no edema   ABD:  soft and nontender with pos end  insp Hoover's  in the supine position. No bruits or organomegaly appreciated, bowel sounds nl  MS:   Nl gait/  ext warm  without deformities, calf tenderness, cyanosis or clubbing No obvious joint restrictions   SKIN: warm and dry without lesions    NEURO:  alert, approp, nl sensorium with  no motor or cerebellar deficits apparent.        CXR PA and Lateral:   12/08/2018 :    I personally reviewed images and agree with radiology impression as follows:   COPD and mild cardiomegaly.  No active lung disease.  Labs ordered/ reviewed:      Chemistry      Component Value Date/Time   NA 141 12/08/2018 1536   K 3.9 12/08/2018 1536   CL 98 12/08/2018 1536   CO2 41 (H) 12/08/2018 1536   BUN 10 12/08/2018 1536   CREATININE 1.06 12/08/2018 1536      Component Value Date/Time   CALCIUM 9.5 12/08/2018 1536   ALKPHOS 83 11/09/2018 1137   AST 17 11/09/2018 1137   ALT 10 11/09/2018 1137   BILITOT 0.3 11/09/2018 1137        Lab Results  Component Value Date   WBC 8.7 12/08/2018   HGB 11.7 (L) 12/08/2018   HCT 35.1 (L) 12/08/2018   MCV 93.5 12/08/2018   PLT 271.0 12/08/2018         Labs ordered 12/08/2018  :     alpha one AT phenotype       Assessment   No problem-specific Assessment & Plan notes found for this encounter.     Christinia Gully, MD 12/08/2018

## 2018-12-08 NOTE — Patient Instructions (Addendum)
Plan A = Automatic = Always=   Symbicort 160 Take 2 puffs first thing in am and then another 2 puffs about 12 hours later.   Work on inhaler technique:  relax and gently blow all the way out then take a nice smooth deep breath back in, triggering the inhaler at same time you start breathing in.  Hold for up to 5 seconds if you can. Blow out thru nose. Rinse and gargle with water when done - brush gums with arm and hammer toothpaste      Plan B = Backup (to supplement plan A, not to replace it) Only use your albuterol inhaler as a rescue medication to be used if you can't catch your breath by resting or doing a relaxed purse lip breathing pattern.  - The less you use it, the better it will work when you need it. - Ok to use the inhaler up to 2 puffs  every 4 hours if you must but call for appointment if use goes up over your usual need - Don't leave home without it !!  (think of it like the spare tire for your car)   Plan C = Crisis (instead of Plan B but only if Plan B stops working) - only use your albuterol nebulizer if you first try Plan B and it fails to help > ok to use the nebulizer up to every 4 hours but if start needing it regularly call for immediate appointment   Please remember to go to the lab and x-ray department   for your tests - we will call you with the results when they are available  Goal is to keep your 02 saturation above 90%   Please schedule a follow up office visit in 4 weeks, sooner if needed  with all medications /inhalers/ solutions in hand so we can verify exactly what you are taking. This includes all medications from all doctors and over the counters

## 2018-12-08 NOTE — Telephone Encounter (Signed)
Please see previous note: no further Xanax refills will be allowed.

## 2018-12-08 NOTE — Assessment & Plan Note (Signed)

## 2018-12-08 NOTE — Telephone Encounter (Signed)
Form has been completed.

## 2018-12-09 ENCOUNTER — Telehealth: Payer: Self-pay | Admitting: Internal Medicine

## 2018-12-09 ENCOUNTER — Encounter: Payer: Self-pay | Admitting: Internal Medicine

## 2018-12-09 LAB — CBC WITH DIFFERENTIAL/PLATELET
Basophils Absolute: 0.1 10*3/uL (ref 0.0–0.1)
Basophils Relative: 1 % (ref 0.0–3.0)
Eosinophils Absolute: 0.2 10*3/uL (ref 0.0–0.7)
Eosinophils Relative: 2.3 % (ref 0.0–5.0)
HCT: 35.1 % — ABNORMAL LOW (ref 36.0–46.0)
Hemoglobin: 11.7 g/dL — ABNORMAL LOW (ref 12.0–15.0)
Lymphocytes Relative: 25.4 % (ref 12.0–46.0)
Lymphs Abs: 2.2 10*3/uL (ref 0.7–4.0)
MCHC: 33.4 g/dL (ref 30.0–36.0)
MCV: 93.5 fl (ref 78.0–100.0)
Monocytes Absolute: 0.9 10*3/uL (ref 0.1–1.0)
Monocytes Relative: 10.2 % (ref 3.0–12.0)
Neutro Abs: 5.3 10*3/uL (ref 1.4–7.7)
Neutrophils Relative %: 61.1 % (ref 43.0–77.0)
Platelets: 271 10*3/uL (ref 150.0–400.0)
RBC: 3.75 Mil/uL — ABNORMAL LOW (ref 3.87–5.11)
RDW: 12.6 % (ref 11.5–15.5)
WBC: 8.7 10*3/uL (ref 4.0–10.5)

## 2018-12-09 LAB — BASIC METABOLIC PANEL
BUN: 10 mg/dL (ref 6–23)
CO2: 41 mEq/L — ABNORMAL HIGH (ref 19–32)
Calcium: 9.5 mg/dL (ref 8.4–10.5)
Chloride: 98 mEq/L (ref 96–112)
Creatinine, Ser: 1.06 mg/dL (ref 0.40–1.20)
GFR: 52.48 mL/min — ABNORMAL LOW (ref >60.00)
Glucose, Bld: 83 mg/dL (ref 70–99)
Potassium: 3.9 mEq/L (ref 3.5–5.1)
Sodium: 141 mEq/L (ref 135–145)

## 2018-12-09 NOTE — Telephone Encounter (Signed)
Pt is upset about no more refills and states that  is trying to find someone to treat her that can give her these meds, wanted another refill but st to pt that last dr note states she would need to find a doctor that would monitor this medication for her.

## 2018-12-09 NOTE — Telephone Encounter (Signed)
Faxed and confirmed

## 2018-12-09 NOTE — Telephone Encounter (Signed)
Spoke with patient. She was calling back in regards to her lab and CXR results. Explained results to patient, she verbalized understanding.   While on the phone, she wanted to know if MW would be able to call in a RX for her Xanax. She stated that she is completely out and does not want to go through withdrawals. Advised patient to contact her PCP. She verbalized understanding.

## 2018-12-09 NOTE — Telephone Encounter (Signed)
She was advised at her first visit that I was not willing to continue her BDZ prescription. Despite that, I allowed #30 tabs at last visit as she was having trouble finding a psychiatrist. I will not be prescribing further.

## 2018-12-09 NOTE — Telephone Encounter (Signed)
FYI

## 2018-12-09 NOTE — Progress Notes (Signed)
LMTCB

## 2018-12-10 ENCOUNTER — Other Ambulatory Visit: Payer: Self-pay

## 2018-12-10 NOTE — Telephone Encounter (Signed)
I am not a psychiatrist and alprazolam is for anxiety- I only take on Benzo's if for muscle spasms, and I know hers it NOT for that diagnosis- I'm not a pain medicine physician and even most pain mgmt doctors don't take on Psychiatric meds that are controlled.  Sorry  But I don't feel comfortable taking that on.

## 2018-12-10 NOTE — Telephone Encounter (Signed)
Patient called back.  The medication is alprazolam medication.  She was having it refilled at her previous internal medicine clinic in [place] but no longer is a patient there and her current family practice does not wish to start a benzo contract with patient.  Patient has requested we refill her medication and begin a contract.

## 2018-12-10 NOTE — Telephone Encounter (Signed)
Patient called yesterday after noon after 4pm in regards to needing a medication refill on meds that she is currently out of but did not specify as to which ones need the refill.  Attempted to call her back to figure out the names.  No answer, left message to return call.

## 2018-12-11 NOTE — Telephone Encounter (Signed)
I notified Ms Stonecypher. She has been unable to find a psychiatrist that will prescribe for her. I advised her to go back to the primary care because being on it long term would mean she may go through withdrawal if suddenly stopped. Perhaps they can refer her somewhere.

## 2018-12-12 LAB — ALPHA-1 ANTITRYPSIN PHENOTYPE: A-1 Antitrypsin, Ser: 159 mg/dL (ref 83–199)

## 2018-12-14 ENCOUNTER — Encounter
Payer: Medicare Other | Attending: Physical Medicine and Rehabilitation | Admitting: Physical Medicine and Rehabilitation

## 2018-12-14 ENCOUNTER — Other Ambulatory Visit: Payer: Self-pay

## 2018-12-14 ENCOUNTER — Encounter: Payer: Self-pay | Admitting: Physical Medicine and Rehabilitation

## 2018-12-14 VITALS — BP 128/75 | HR 90 | Temp 97.7°F | Ht 61.0 in | Wt 121.0 lb

## 2018-12-14 DIAGNOSIS — M5432 Sciatica, left side: Secondary | ICD-10-CM | POA: Diagnosis not present

## 2018-12-14 DIAGNOSIS — Z79891 Long term (current) use of opiate analgesic: Secondary | ICD-10-CM | POA: Diagnosis not present

## 2018-12-14 DIAGNOSIS — M5416 Radiculopathy, lumbar region: Secondary | ICD-10-CM | POA: Insufficient documentation

## 2018-12-14 DIAGNOSIS — M94269 Chondromalacia, unspecified knee: Secondary | ICD-10-CM | POA: Insufficient documentation

## 2018-12-14 DIAGNOSIS — Z5181 Encounter for therapeutic drug level monitoring: Secondary | ICD-10-CM | POA: Diagnosis not present

## 2018-12-14 DIAGNOSIS — F32A Depression, unspecified: Secondary | ICD-10-CM

## 2018-12-14 DIAGNOSIS — F419 Anxiety disorder, unspecified: Secondary | ICD-10-CM

## 2018-12-14 DIAGNOSIS — E039 Hypothyroidism, unspecified: Secondary | ICD-10-CM | POA: Insufficient documentation

## 2018-12-14 DIAGNOSIS — G8929 Other chronic pain: Secondary | ICD-10-CM | POA: Diagnosis not present

## 2018-12-14 DIAGNOSIS — F329 Major depressive disorder, single episode, unspecified: Secondary | ICD-10-CM

## 2018-12-14 DIAGNOSIS — G894 Chronic pain syndrome: Secondary | ICD-10-CM | POA: Diagnosis not present

## 2018-12-14 MED ORDER — MIRTAZAPINE 15 MG PO TABS: 15.0000 mg | ORAL_TABLET | Freq: Every day | ORAL | 5 refills | Status: DC

## 2018-12-14 MED ORDER — BUSPIRONE HCL 7.5 MG PO TABS: 15.0000 mg | ORAL_TABLET | Freq: Three times a day (TID) | ORAL | 5 refills | Status: DC

## 2018-12-14 MED ORDER — OXYCODONE HCL 5 MG PO CAPS: 5.0000 mg | ORAL_CAPSULE | Freq: Three times a day (TID) | ORAL | 0 refills | Status: DC | PRN

## 2018-12-14 NOTE — Patient Instructions (Signed)
  1. Pt is a 63 yr old female with Sciatica and chronic low back pain/lumbar radiculopathy s/p 3 lumbar surgeries in 1990s. Also has severe anxiety- was on Xanax 0.5 mg prior- and on O2 full time and insomnia   1. For anxiety, will try Buspar- 7.5 mg 3x/day scheduled- than in 5-7 days, if not enough, can increase to 15 mg 3x/day- for anxiety  2. Decrease Remeron to 15 mg nightly for sleep. Sleep schedule is essential- a routine - avoid caffeine 5-6 hours before bed; start going to  bed 30-60 minutes before bed. Don't want to start Paxil since on 2 SSRI/SNRIs   3.  Needs to con't O2.  4. Con't Oxycodone at current dose and Lyrica 50 mg (still has >10+ pills left, so not taking every day regularly)  BID at current dose for this appointment.   5. Xray of R knee- secondary to pain.  5. F/U  1 month

## 2018-12-14 NOTE — Progress Notes (Signed)
Subjective:    Patient ID: [name], female    DOB: [birth year], 63 y.o.   MRN: [id number]  HPI  CC: Low back pain.   Checked out a long list to find a new psychiatrist-   Having panic attacks- since ran out of meds/benzo's weaned down- last Wednesday was the last day she had Xanax.  Is shaking constantly/tremors in LEs;  not wearing O2.  Hasn't had any side effects from lyrica-  Numbness hasn't gone away Been having dizzy spells- everything started spinning- started since Beginning of October- a couple times per day has been dizzy. Was once in awhile and now more frequently. Lasted ~ 20 minutes.  Has been tapering self off O2- Hasn't discussed with Pulmonologist- feeling SOB- Tried to explain O2 is necessary- Has water/moisture  in tubing. Asked pt to call Pulmonary- about this. Dr Melvyn Novas- - told her not to do Nebulizers.  Willing to try something else for anxiety- never tried Buspar-   Tries to  get up with husband at 4:30 am- as early as 7pm- usually 9pm.      Pain Inventory Average Pain 7 Pain Right Now 6 My pain is aching  In the last 24 hours, has pain interfered with the following? General activity 9 Relation with others 0 Enjoyment of life 10 What TIME of day is your pain at its worst? morning Sleep (in general) Poor  Pain is worse with: walking, bending and standing Pain improves with: medication Relief from Meds: 5  Mobility walk without assistance how many minutes can you walk? 1-2 ability to climb steps?  yes do you drive?  yes  Function Do you have any goals in this area?  no  Neuro/Psych weakness numbness tingling spasms dizziness depression anxiety  Prior Studies x-rays  Physicians involved in your care Pulmonlogist   Family History  Problem Relation Age of Onset  . Colon polyps Mother   . Deep vein thrombosis Mother   . Heart disease Mother   . Hypertension Mother   . Bleeding Disorder Mother   . Hypertension  Sister   . Heart disease Brother        before age 66  . Hypertension Brother    Social History   Socioeconomic History  . Marital status: Married    Spouse name: Not on file  . Number of children: 2  . Years of education: Not on file  . Highest education level: Not on file  Occupational History    Comment: Disabled  Social Needs  . Financial resource strain: Not on file  . Food insecurity    Worry: Not on file    Inability: Not on file  . Transportation needs    Medical: Not on file    Non-medical: Not on file  Tobacco Use  . Smoking status: Current Every Day Smoker    Packs/day: 0.25    Years: 40.00    Pack years: 10.00    Types: Cigarettes    Start date: 09/06/1972  . Smokeless tobacco: Never Used  Substance and Sexual Activity  . Alcohol use: No    Alcohol/week: 0.0 standard drinks  . Drug use: No  . Sexual activity: Not Currently    Birth control/protection: Surgical  Lifestyle  . Physical activity    Days per week: Not on file    Minutes per session: Not on file  . Stress: Not on file  Relationships  . Social connections    Talks on phone: Not on  file    Gets together: Not on file    Attends religious service: Not on file    Active member of club or organization: Not on file    Attends meetings of clubs or organizations: Not on file    Relationship status: Not on file  Other Topics Concern  . Not on file  Social History Narrative  . Not on file   Past Surgical History:  Procedure Laterality Date  . ABDOMINAL HYSTERECTOMY     without oophorectomy for neoplastic disease  . BACK SURGERY    . BIOPSY  10/22/2018   Procedure: BIOPSY;  Surgeon: Danie Binder, MD;  Location: [facility];  Service: Endoscopy;;  duodenal, gastric  . CARDIAC CATHETERIZATION  07/2013   normal coronary arteries  . CARPAL TUNNEL RELEASE     Left  . CHOLECYSTECTOMY  2007  . DILATION AND CURETTAGE OF UTERUS    . ESOPHAGOGASTRODUODENOSCOPY (EGD) WITH PROPOFOL N/A 10/22/2018    gastritis (mild reactive gastropathy, negative duodenal biopsies for celiac).   Marland Kitchen LAPAROTOMY    . LEFT HEART CATHETERIZATION WITH CORONARY ANGIOGRAM N/A 07/12/2013   Procedure: LEFT HEART CATHETERIZATION WITH CORONARY ANGIOGRAM;  Surgeon: Leonie Man, MD;  Location: [facility] CATH LAB;  Service: Cardiovascular;  Laterality: N/A;  . LUMBAR LAMINECTOMY  x3   Left; complicated by neurologic dysfunction  . NASAL SINUS SURGERY     Past Medical History:  Diagnosis Date  . Anemia   . Anxiety and depression   . Bronchitis   . Cancer ([place])    uterus  . CHF (congestive heart failure) ([place])   . Chronic pain 11/22/2010  . COPD (chronic obstructive pulmonary disease) ([place])   . Coronary artery disease   . Gastroesophageal reflux disease 11/22/2010  . Hyperlipidemia   . Hypertension   . Hypothyroid   . Migraine headache 11/22/2010  . Near syncope   . On home oxygen therapy    started 08/2018  . Peripheral vascular disease ([place])   . Pneumonia   . Tobacco abuse    1/2 pack per day   BP 128/75   Pulse 90   Temp 97.7 F (36.5 C)   Ht 5\' 1"  (1.549 m)   Wt 121 lb (54.9 kg)   SpO2 92%   BMI 22.86 kg/m   Opioid Risk Score:   Fall Risk Score:  `1  Depression screen PHQ 2/9  Depression screen PHQ 2/9 09/17/2018  Decreased Interest 0  Down, Depressed, Hopeless 0  PHQ - 2 Score 0  Altered sleeping 0  Tired, decreased energy 0  Change in appetite 1  Feeling bad or failure about yourself  0  Trouble concentrating 0  Moving slowly or fidgety/restless 0  Suicidal thoughts 0  PHQ-9 Score 1  Difficult doing work/chores Not difficult at all    Review of Systems  Respiratory: Positive for cough, shortness of breath and wheezing.   Psychiatric/Behavioral: The patient is nervous/anxious.        Objective:   Physical Exam sats 92% off O2/on RA BP 128/75/ pulse 90; temp 97.7 R tibial plateau is edematous/swollen, very TTP RRR- not tachycardic CTA B/L- adequate air movement- is coarse  Soft, NT, ND, (+)BS     Assessment & Plan:   1. Pt is a 63 yr old female with Sciatica and chronic low back pain/lumbar radiculopathy s/p 3 lumbar surgeries in 1990s. Also has severe anxiety- was on Xanax 0.5 mg prior- and on O2 full time and insomnia  1. For anxiety, will try Buspar- 7.5 mg 3x/day scheduled- than in 5-7 days, if not enough, can increase to 15 mg 3x/day- for anxiety  2. Decrease Remeron to 15 mg nightly for sleep. Sleep schedule is essential- a routine - avoid caffeine 5-6 hours before bed; start going to  bed 30-60 minutes before bed. Don't want to start Paxil since on 2 SSRI/SNRIs   3.  Needs to con't O2.  4. Con't Oxycodone at current dose and Lyrica 50 mg (still has >10+ pills left, so not taking every day regularly)  BID at current dose for this appointment.   5. Xray of R knee- for tibial plateau  5. F/U  1 month  Spent a total of 40 minutes on visit- more than 25 minutes discussing options other than benzo's, and plan as above.

## 2018-12-14 NOTE — Progress Notes (Signed)
Pt aware.

## 2018-12-18 ENCOUNTER — Ambulatory Visit (HOSPITAL_COMMUNITY)
Admission: RE | Admit: 2018-12-18 | Discharge: 2018-12-18 | Disposition: A | Discharge status: Home / Self Care | Payer: Medicare Other | Source: Ambulatory Visit | Attending: Physical Medicine and Rehabilitation | Admitting: Physical Medicine and Rehabilitation

## 2018-12-18 ENCOUNTER — Other Ambulatory Visit: Payer: Self-pay

## 2018-12-18 DIAGNOSIS — M94269 Chondromalacia, unspecified knee: Secondary | ICD-10-CM

## 2018-12-18 DIAGNOSIS — E039 Hypothyroidism, unspecified: Secondary | ICD-10-CM | POA: Diagnosis not present

## 2018-12-18 DIAGNOSIS — M11261 Other chondrocalcinosis, right knee: Secondary | ICD-10-CM | POA: Diagnosis not present

## 2018-12-18 DIAGNOSIS — M1711 Unilateral primary osteoarthritis, right knee: Secondary | ICD-10-CM | POA: Diagnosis not present

## 2018-12-19 LAB — TSH: TSH: 5.38 u[IU]/mL — ABNORMAL HIGH (ref 0.450–4.500)

## 2018-12-21 DIAGNOSIS — J441 Chronic obstructive pulmonary disease with (acute) exacerbation: Secondary | ICD-10-CM | POA: Diagnosis not present

## 2018-12-28 DIAGNOSIS — J441 Chronic obstructive pulmonary disease with (acute) exacerbation: Secondary | ICD-10-CM | POA: Diagnosis not present

## 2019-01-06 ENCOUNTER — Other Ambulatory Visit (HOSPITAL_COMMUNITY): Payer: Self-pay | Admitting: Internal Medicine

## 2019-01-06 DIAGNOSIS — Z1231 Encounter for screening mammogram for malignant neoplasm of breast: Secondary | ICD-10-CM

## 2019-01-08 ENCOUNTER — Ambulatory Visit (INDEPENDENT_AMBULATORY_CARE_PROVIDER_SITE_OTHER): Payer: Medicare Other | Admitting: Internal Medicine

## 2019-01-08 ENCOUNTER — Encounter: Payer: Self-pay | Admitting: Internal Medicine

## 2019-01-08 DIAGNOSIS — J9612 Chronic respiratory failure with hypercapnia: Secondary | ICD-10-CM | POA: Diagnosis not present

## 2019-01-08 DIAGNOSIS — F17210 Nicotine dependence, cigarettes, uncomplicated: Secondary | ICD-10-CM

## 2019-01-08 DIAGNOSIS — J449 Chronic obstructive pulmonary disease, unspecified: Secondary | ICD-10-CM | POA: Diagnosis not present

## 2019-01-08 DIAGNOSIS — J9611 Chronic respiratory failure with hypoxia: Secondary | ICD-10-CM

## 2019-01-08 NOTE — Progress Notes (Signed)
[name], female    DOB: 1955-05-16      MRN: [id number]   Brief patient profile:  26 yowf active smoker with onset somewhat limited by both breathing and back/ legs x 2017  With GOLD II criteria by spirometry  07/17/15   rx with proair avg sev times a week at most no maint rx then acutely ill for a few days weakness > sob  before admitted [facility]  With new acute resp failure/ severe hypoxemia.      Admission date:  08/24/2018  Admitting Physician  Beverly Roys, MD  Discharge Date:  08/27/2018   Primary MD  Beverly Gravel, MD  Recommendations for primary care physician for things to follow:   1)Very low-salt diet advised 2)Weigh yourself daily, call if you gain more than 3 pounds in 1 day or more than 5 pounds in 1 week as your diuretic medications may need to be adjusted 3)Limit your Fluid  intake to no more than 60 ounces (1.8 Liters) per day 4)Follow -up with gastroenterologist Dr. Barney Smith at Address: [address] [place], [place] [address] Phone: [phone]---- most likely need upper endoscopy and colonoscopy 5)Avoid ibuprofen/Advil/Aleve/Motrin/Goody Powders/Naproxen/BC powders/Meloxicam/Diclofenac/Indomethacin and other Nonsteroidal anti-inflammatory medications as these will make you more likely to bleed and can cause stomach ulcers, can also cause Kidney problems.  6) you need repeat CBC and BMP blood test around Tuesday, 09/01/2018 7) absolutely no smoking around oxygen--this related to fire, injury and Death   Admission Diagnosis  Hypokalemia [E87.6] Pleural effusion [J90] Hypoxia [R09.02] AKI (acute kidney injury) ([place]) [N17.9] Gastrointestinal hemorrhage, unspecified gastrointestinal hemorrhage type [K92.2] Anemia, unspecified type [D64.9] Depression, unspecified depression type [F32.9] Community acquired pneumonia of right lung, unspecified part of lung [J18.9]   Discharge Diagnosis  Hypokalemia [E87.6] Pleural effusion [J90] Hypoxia [R09.02]  AKI (acute kidney injury) ([place]) [N17.9] Gastrointestinal hemorrhage, unspecified gastrointestinal hemorrhage type [K92.2] Anemia, unspecified type [D64.9] Depression, unspecified depression type [F32.9] Community acquired pneumonia of right lung, unspecified part of lung [J18.9]    Principal Problem:   Acute respiratory failure with hypoxia ([place]) Active Problems:   Anxiety and depression   HTN (hypertension)   COPD exacerbation ([facility])   Hypokalemia   Elevated LFTs   Anemia   Gastrointestinal hemorrhage          Past Medical History:  Diagnosis Date  . Anemia   . Anxiety and depression   . Bronchitis   . Cancer ([place])    uterus  . CHF (congestive heart failure) ([place])   . Chronic pain 11/22/2010  . COPD (chronic obstructive pulmonary disease) ([place])   . Gastroesophageal reflux disease 11/22/2010  . Hyperlipidemia   . Hypertension   . Hypothyroid   . Migraine headache 11/22/2010  . Near syncope   . Peripheral vascular disease ([place])   . Pneumonia   . Tobacco abuse    1/2 pack per day         Past Surgical History:  Procedure Laterality Date  . ABDOMINAL HYSTERECTOMY     without oophorectomy for neoplastic disease  . BACK SURGERY    . CARDIAC CATHETERIZATION  07/2013   normal coronary arteries  . CARPAL TUNNEL RELEASE     Left  . CHOLECYSTECTOMY  2007  . DILATION AND CURETTAGE OF UTERUS    . LAPAROTOMY    . LEFT HEART CATHETERIZATION WITH CORONARY ANGIOGRAM N/A 07/12/2013   Procedure: LEFT HEART CATHETERIZATION WITH CORONARY ANGIOGRAM;  Surgeon: Beverly Man, MD;  Location: [facility]  CATH LAB;  Service: Cardiovascular;  Laterality: N/A;  . LUMBAR LAMINECTOMY  x3   Left; complicated by neurologic dysfunction  . NASAL SINUS SURGERY       HPI  from the history and physical done on the day of admission:     Patient coming from:Home  I have personally briefly reviewed patient's old medical records in [facility]  Chief  Complaint:Weakness  [name] L [name] a 63 y.o.femalewith medical history significant forCHF, hypothyroidism, peripheral vascular disease, COPD, depression anxiety, hypertension, subclavian steal syndrome, CVA. At the time of my evaluation patient is alert and oriented,able to give me a history. Who presented to the ED with complaints of generalized weakness of 2 weeks,and confusion at night. Patient reports over the past 2 weeks she has barely eatenor drank anything that she has felt so depressed. No abdominal pain, no vomiting, she reports some occasional loose stools. She reports chronic difficulty breathing,but is unable to tell me if this has changed recently. She denies cough. No chest pain. No fever or chills. She reports bilateral lower extremity swelling of about 2 weeks duration now. She reports her normal weight is about 115pounds. She denies being on any fluid pills. She denies vomiting of blood, denies black stools or bloody stools. She denies NSAID use. She is unaware of a history of heart disease or stroke,though both of these are listed on her medical problems. Patient is supposed to be on CPAP at night and home oxygen,both of which she has not used in at least 4 years. Per patient spouse-he has been seeing blood in patient's underwear,and patient has been admitted to [facility] for same.  ED Course:Temperature 98. Respiratory rate 17-24, blood pressure systolic 1 1 7-1 2 9, O2 sats 68% on room air. Patient was initially lethargic and somewhat confused, this improved after patient was placed on 2 L nasal cannula she became more alert and oriented. After placing patient on O2 by nasal cannula, patient's been BNP elevated at 1260.Mild leukocytosis 12.4. Low potassium 2.6. Elevated creatinine 2.47. Elevated bicarb 33. Hemoglobin 7.9. EKG without significant change from prior. Stool occult positive. Portable chest x-ray shows interval  cardiomegaly and moderate-sized right pleural effusion with right basilar atelectasis or pneumonia. Patient started on IV ceftriaxone and azithromycin. Hospitalist to admit for community-acquired pneumonia.     Hospital Course:       A/p Acute respiratory failure with hypoxia-with reported dyspnea,---most likely due to pneumonia and CHF, patient continues to require supplemental oxygen, currently requiring 3 L via nasal cannula   HFpEF/dCHF history-decompensated. New 1+ pitting pedal edema bilaterally,marked elevated BNP 1260 ( was 67 fouryears ago)chest x-ray with moderate sized right pleural effusion, no echo on file.  PTA was Not on diuretics. She is unaware if she has gained weight. Denies chest pain. EKG unchanged.  -Diuresed well with Lasix,.  Okay to discharge home on p.o. Lasix -Echocardiogram with LVEF 50-55%  Rt Sided Pleural Effusion--- status post ultrasound-guided thoracentesis with removal of 600 mL of serous fluid, fluid studies pending, postthoracentesis chest x-ray shows significant improvement in right-sided pleural effusion and right lung aeration  Acute kidney injury-creatininewas2.47,improving, creatinine is down to 1.51,Last available creatinine 4 years ago baseline 0.6 - 0.7.-----  Generalized weakness-likely multifactorial-overall improved,  Metabolic encephalopathy-likely from hypoxia/hypercapnia due to CPAP non-compliance,and probably pneumonia.  -Mental statuschanges have resolved, patient is back to baseline  Acuteiron deficiencyanemia-hemoglobinis up to 9.6 post transfusion of 2 units of packed cells,Last check 4 years ago baseline 11-12. Stool occult  positive.She denies black stools or blood loss, orabdominal pain.[place] use. NoColonoscopy or EGD on file. Per patient spouse-he has been seeing blood in patient's underwear,and patient has been admitted to [facility] for same. S/pFeraheme  transfusion  - CBC a.m -GI following with possible EGD soon and records being obtained from [facility] --GI consult appreciated and recommend outpatient EGD with Dr. Katharine Look fields  Tobacco abuse/COPD exacerbation/OSA--Hypoxia persist, smoking cessation advised, continue bronchodilators and steroids, treated with Rocephin---Continue CPAP and supplemental oxygen  Anxiety/depression- reports depressed feelings over the past 2 weeks,which resulted in poor p.o. intake. Denies suicidal or homicidal ideations. -TTSpsychiatry consultation appreciated, they recommend outpatient follow-up  Disposition--- patient repeatedly came down to the nursing station, demanding to be discharged home or she would leave AMA. --It took a lot of pleading for patient to agree to right-sided thoracentesis prior to discharge --She wanted to be discharged home As possible refused to stay for further treatment or interventions --She repeatedly threatening to leave AMA so she was reluctantly discharged home with home O2 and home health services          History of Present Illness  12/08/2018  Pulmonary/ 1st office eval/  GOLD II/ still smoking on symb 17 'now and then"  And 02 24/7  Chief Complaint  Patient presents with  . Pulmonary Consult    Referred by Dr Deniece Ree. Pt c/o SOB since July 2020. She states she was hospitalized with CHF then and started on o2 3lpm continuous o2.   Dyspnea:  Better  But can't walk the dog yet, better on 02 3lpm cont Cough: none  Sleep: no resp symptoms  flat  SABA use: less than usual / nebulizer twice weekly at most  0 2 3lpm 24/7 does not titrate though has pulse ox rec Plan A = Automatic = Always=   Symbicort 160 Take 2 puffs first thing in am and then another 2 puffs about 12 hours later.  Work on inhaler technique: Plan B = Backup (to supplement plan A, not to replace it) Only use your albuterol inhaler as a rescue medication Plan C = Crisis (instead of  Plan B but only if Plan B stops working) - only use your albuterol nebulizer if you first try Plan B and it fails to help      Virtual Visit via Telephone Note 01/08/2019   I connected with [name] on 01/08/19 at  1:15PM EST by telephone and verified that I am speaking with the correct person using two identifiers.   I discussed the limitations, risks, security and privacy concerns of performing an evaluation and management service by telephone and the availability of in person appointments. I also discussed with the patient that there may be a patient responsible charge related to this service. The patient expressed understanding and agreed to proceed.   History of Present Illness: re previous gold II/ 02 dep s/p admit maint just symb 160 2bid /still smoking but cutting down Dyspnea:  Walking dog wearing 02  3lpm cont  As high as 5lpm  Cough: none now Sleeping: on side flat bed one pillow  SABA use: maybe 3 x daily "you said to stop  02: 3lpm restless    No obvious day to day or daytime variability or assoc excess/ purulent sputum or mucus plugs or hemoptysis or cp or chest tightness, subjective wheeze or overt sinus or hb symptoms.    Also denies any obvious fluctuation of symptoms with weather or environmental changes or other aggravating  or alleviating factors except as outlined above.   Meds reviewed/ med reconciliation completed         Observations/Objective: Speaking full phrases, min hoarseness   Assessment and Plan: See problem list for active a/p's   Follow Up Instructions: See avs for instructions unique to this ov which includes revised/ updated med list     I discussed the assessment and treatment plan with the patient. The patient was provided an opportunity to ask questions and all were answered. The patient agreed with the plan and demonstrated an understanding of the instructions.   The patient was advised to call back or seek an in-person  evaluation if the symptoms worsen or if the condition fails to improve as anticipated.  I provided 23minutes of non-face-to-face time during this encounter.   Christinia Gully, MD         Past Medical History:  Diagnosis Date  . Anemia   . Anxiety and depression   . Bronchitis   . Cancer ([place])    uterus  . CHF (congestive heart failure) ([place])   . Chronic pain 11/22/2010  . COPD (chronic obstructive pulmonary disease) ([place])   . Coronary artery disease   . Gastroesophageal reflux disease 11/22/2010  . Hyperlipidemia   . Hypertension   . Hypothyroid   . Migraine headache 11/22/2010  . Near syncope   . On home oxygen therapy    started 08/2018  . Peripheral vascular disease ([place])   . Pneumonia   . Tobacco abuse    1/2 pack per day

## 2019-01-08 NOTE — Assessment & Plan Note (Signed)
Counseled re importance of smoking cessation but did not meet time criteria for separate billing     Each maintenance medication was reviewed in detail including most importantly the difference between maintenance and as needed and under what circumstances the prns are to be used.  Please see AVS for specific  Instructions which are unique to this visit and I personally typed out  which were reviewed in detail over the phone with the patient and a copy provided via mail     

## 2019-01-08 NOTE — Patient Instructions (Addendum)
    Plan A = Automatic = Always=   Symbicort 160 Take 2 puffs first thing in am and then another 2 puffs about 12 hours later.   Work on inhaler technique:  relax and gently blow all the way out then take a nice smooth deep breath back in, triggering the inhaler at same time you start breathing in.  Hold for up to 5 seconds if you can. Blow out thru nose. Rinse and gargle with water when done - brush gums with arm and hammer toothpaste      Plan B = Backup (to supplement plan A, not to replace it) Only use your albuterol inhaler as a rescue medication to be used if you can't catch your breath by resting or doing a relaxed purse lip breathing pattern.  - The less you use it, the better it will work when you need it. - Ok to use the inhaler up to 2 puffs  every 4 hours if you must but call for appointment if use goes up over your usual need - Don't leave home without it !!  (think of it like the spare tire for your car)   Plan C = Crisis (instead of Plan B but only if Plan B stops working) - only use your albuterol nebulizer if you first try Plan B and it fails to help > ok to use the nebulizer up to every 4 hours but if start needing it regularly call for immediate appointment  Goal is to keep your 02 saturation above 90%   The key is to stop smoking completely before smoking completely stops you!    Please schedule a follow up office visit in 4 weeks, sooner if needed  with all medications /inhalers/ solutions in hand so we can verify exactly what you are taking. This includes all medications from all doctors and over the counters

## 2019-01-08 NOTE — Assessment & Plan Note (Signed)
Active smoker - Spirometry 07/17/15   FEV1 1.28 (53%)  Ratio 0.58 classic concave contour to f/v loop  - 12/08/2018  After extensive coaching inhaler device,  effectiveness =    75% try increase symb to 80 2bid   -  Alpha one AT screen 12/08/2018  MM   Level 159   More of an AB component at present but likely Group D so will need to add lama on return on change to triple rx with Breztri depending on insurance.  Advised:  formulary restrictions will be an ongoing challenge for the forseable future and I would be happy to pick an alternative if the pt will first  provide me a list of them -  pt  will need to return here for training for any new device that is required eg dpi vs hfa vs respimat.    In the meantime we can always provide samples so that the patient never runs out of any needed respiratory medications.    .Pt informed of the seriousness of COVID 19 infection as a direct risk to their health  and safey and to those of their loved ones and should continue to wear facemask in public and minimize exposure to public locations but especially avoid any area or activity where non-close contacts are not observing distancing or wearing an appropriate face mask.

## 2019-01-08 NOTE — Assessment & Plan Note (Signed)
D/c from [facility] 08/27/18 with HC03 30 on 3lpm  12/08/2018  Patient Saturations on Room Air at Rest = 94%, while Ambulating = 87% Patient Saturations on 3  Liters of pulsed oxygen while Ambulating = 95%  Reports needing up to 5 lpm when walks the dog so hold off on POC for now and just titrate:  Advised: Make sure you check your oxygen saturations at highest level of activity to be sure it stays over 90% and adjust upward to maintain this level if needed but remember to turn it back to previous settings when you stop (to conserve your supply).

## 2019-01-11 ENCOUNTER — Encounter: Payer: Self-pay | Admitting: Physical Medicine and Rehabilitation

## 2019-01-11 ENCOUNTER — Encounter
Payer: Medicare Other | Attending: Physical Medicine and Rehabilitation | Admitting: Physical Medicine and Rehabilitation

## 2019-01-11 ENCOUNTER — Other Ambulatory Visit: Payer: Self-pay

## 2019-01-11 VITALS — BP 119/74 | HR 91 | Temp 97.8°F | Ht 62.0 in | Wt 120.0 lb

## 2019-01-11 DIAGNOSIS — F419 Anxiety disorder, unspecified: Secondary | ICD-10-CM | POA: Diagnosis not present

## 2019-01-11 DIAGNOSIS — F32A Depression, unspecified: Secondary | ICD-10-CM

## 2019-01-11 DIAGNOSIS — G8929 Other chronic pain: Secondary | ICD-10-CM | POA: Diagnosis not present

## 2019-01-11 DIAGNOSIS — Z5181 Encounter for therapeutic drug level monitoring: Secondary | ICD-10-CM | POA: Diagnosis not present

## 2019-01-11 DIAGNOSIS — E039 Hypothyroidism, unspecified: Secondary | ICD-10-CM

## 2019-01-11 DIAGNOSIS — M5416 Radiculopathy, lumbar region: Secondary | ICD-10-CM

## 2019-01-11 DIAGNOSIS — M94269 Chondromalacia, unspecified knee: Secondary | ICD-10-CM | POA: Insufficient documentation

## 2019-01-11 DIAGNOSIS — M5432 Sciatica, left side: Secondary | ICD-10-CM | POA: Diagnosis not present

## 2019-01-11 DIAGNOSIS — F17210 Nicotine dependence, cigarettes, uncomplicated: Secondary | ICD-10-CM

## 2019-01-11 DIAGNOSIS — F329 Major depressive disorder, single episode, unspecified: Secondary | ICD-10-CM

## 2019-01-11 DIAGNOSIS — Z79891 Long term (current) use of opiate analgesic: Secondary | ICD-10-CM | POA: Insufficient documentation

## 2019-01-11 MED ORDER — ALPRAZOLAM 0.5 MG PO TABS: 0.5000 mg | ORAL_TABLET | Freq: Two times a day (BID) | ORAL | 0 refills | Status: DC | PRN

## 2019-01-11 MED ORDER — OXYCODONE HCL 5 MG PO CAPS: 5.0000 mg | ORAL_CAPSULE | Freq: Three times a day (TID) | ORAL | 0 refills | Status: DC | PRN

## 2019-01-11 MED ORDER — LEVOTHYROXINE SODIUM 100 MCG PO TABS: 100.0000 ug | ORAL_TABLET | Freq: Every day | ORAL | 11 refills | Status: DC

## 2019-01-11 MED ORDER — PREGABALIN 50 MG PO CAPS: 50.0000 mg | ORAL_CAPSULE | Freq: Three times a day (TID) | ORAL | 5 refills | Status: DC

## 2019-01-11 NOTE — Patient Instructions (Signed)
1. Pt is a 63 yr old female with Sciatica and chronic low back pain/lumbar radiculopathy s/p 3 lumbar surgeries in 1990s. Also has severe anxiety- was on Xanax 0.5 mg prior- and on O2 full time- weaning off of and insomnia  2. Lyrica/Pregabalin- increasing 50 mg 3x/day- 1 tab in AM and 2 tabs at night.   3. Will give her refill of oxycodone today- is due- will write for #105 instead of #90  4. Will try Alprazolam 0.5 BID prn- will reduce dose to 2x/day as needed based on clinic policy with pain meds- and will write for a total of 6 months -to give her time to find psychiatrist/someone to write for it.   5. Stop Buspar  6. Increase Synthroid  To 100 mcg daily- don't take within 1 hour of food. Goal to get TSH to ~ 1 and it's currently 5.42  7. Don't make all med changes in 1 day- make 1 med change every 1-2 days.  8. Try to con't Remeron/Mirtazepine. If still has Symptoms of crawling skin, etc, stop it.   9. F/U in 4 weeks.

## 2019-01-11 NOTE — Progress Notes (Signed)
Subjective:    Patient ID: [name], female    DOB: December 31, 1955, 63 y.o.   MRN: [id number]  HPI  1. Pt is a 63 yr old female with Sciatica and chronic low back pain/lumbar radiculopathy s/p 3 lumbar surgeries in 1990s. Also has severe anxiety- was on Xanax 0.5 mg prior- and on O2 full time and insomnia  Buspar is making her skin crawl and has twitches and hair tingling and making her constipated.  Has been up since 3 am since yesterday AM.  At first, seemed like would work, but then [employer] got worse til above.  Psychiatrists are so full- just has been not able to find one. Continues to keep L leg constantly moving when sitting down.  Cold weather makes it worse. Can soak in a tub of hot water- and wraps leg in heating pad- and is "SOL" - and is taking pain meds more because of the weather.  The way she's describing nerve pain, will increase Lyrica a little.  Gets to taper off O2- per Pulmonology-    Used to be on Lortab (noroc 10/325mg ) and Soma- a muscle relaxant. Doesn't remember any other muscle relaxant.   Pain Inventory Average Pain 5 Pain Right Now 6 My pain is burning, stabbing and aching  In the last 24 hours, has pain interfered with the following? General activity 7 Relation with others 7 Enjoyment of life 7 What TIME of day is your pain at its worst? all Sleep (in general) Poor  Pain is worse with: bending, sitting and standing Pain improves with: medication Relief from Meds: 7  Mobility do you drive?  yes  Function disabled: date disabled .  Neuro/Psych numbness tremor tingling spasms depression anxiety  Prior Studies Any changes since last visit?  no  Physicians involved in your care Any changes since last visit?  no   Family History  Problem Relation Age of Onset  . Colon polyps Mother   . Deep vein thrombosis Mother   . Heart disease Mother   . Hypertension Mother   . Bleeding Disorder Mother   . Hypertension Sister   .  Heart disease Brother        before age 79  . Hypertension Brother    Social History   Socioeconomic History  . Marital status: Married    Spouse name: Not on file  . Number of children: 2  . Years of education: Not on file  . Highest education level: Not on file  Occupational History    Comment: Disabled  Social Needs  . Financial resource strain: Not on file  . Food insecurity    Worry: Not on file    Inability: Not on file  . Transportation needs    Medical: Not on file    Non-medical: Not on file  Tobacco Use  . Smoking status: Current Every Day Smoker    Packs/day: 0.25    Years: 40.00    Pack years: 10.00    Types: Cigarettes    Start date: 09/06/1972  . Smokeless tobacco: Never Used  Substance and Sexual Activity  . Alcohol use: No    Alcohol/week: 0.0 standard drinks  . Drug use: No  . Sexual activity: Not Currently    Birth control/protection: Surgical  Lifestyle  . Physical activity    Days per week: Not on file    Minutes per session: Not on file  . Stress: Not on file  Relationships  . Social connections  Talks on phone: Not on file    Gets together: Not on file    Attends religious service: Not on file    Active member of club or organization: Not on file    Attends meetings of clubs or organizations: Not on file    Relationship status: Not on file  Other Topics Concern  . Not on file  Social History Narrative  . Not on file   Past Surgical History:  Procedure Laterality Date  . ABDOMINAL HYSTERECTOMY     without oophorectomy for neoplastic disease  . BACK SURGERY    . BIOPSY  10/22/2018   Procedure: BIOPSY;  Surgeon: Danie Binder, MD;  Location: [facility];  Service: Endoscopy;;  duodenal, gastric  . CARDIAC CATHETERIZATION  07/2013   normal coronary arteries  . CARPAL TUNNEL RELEASE     Left  . CHOLECYSTECTOMY  2007  . DILATION AND CURETTAGE OF UTERUS    . ESOPHAGOGASTRODUODENOSCOPY (EGD) WITH PROPOFOL N/A 10/22/2018   gastritis  (mild reactive gastropathy, negative duodenal biopsies for celiac).   Marland Kitchen LAPAROTOMY    . LEFT HEART CATHETERIZATION WITH CORONARY ANGIOGRAM N/A 07/12/2013   Procedure: LEFT HEART CATHETERIZATION WITH CORONARY ANGIOGRAM;  Surgeon: Leonie Man, MD;  Location: [facility] CATH LAB;  Service: Cardiovascular;  Laterality: N/A;  . LUMBAR LAMINECTOMY  x3   Left; complicated by neurologic dysfunction  . NASAL SINUS SURGERY     Past Medical History:  Diagnosis Date  . Anemia   . Anxiety and depression   . Bronchitis   . Cancer ([place])    uterus  . CHF (congestive heart failure) ([place])   . Chronic pain 11/22/2010  . COPD (chronic obstructive pulmonary disease) ([place])   . Coronary artery disease   . Gastroesophageal reflux disease 11/22/2010  . Hyperlipidemia   . Hypertension   . Hypothyroid   . Migraine headache 11/22/2010  . Near syncope   . On home oxygen therapy    started 08/2018  . Peripheral vascular disease ([place])   . Pneumonia   . Tobacco abuse    1/2 pack per day   BP 119/74   Pulse 91   Temp 97.8 F (36.6 C)   Ht 5\' 2"  (1.575 m)   Wt 120 lb (54.4 kg)   SpO2 95%   BMI 21.95 kg/m   Opioid Risk Score:   Fall Risk Score:  `1  Depression screen PHQ 2/9  Depression screen PHQ 2/9 09/17/2018  Decreased Interest 0  Down, Depressed, Hopeless 0  PHQ - 2 Score 0  Altered sleeping 0  Tired, decreased energy 0  Change in appetite 1  Feeling bad or failure about yourself  0  Trouble concentrating 0  Moving slowly or fidgety/restless 0  Suicidal thoughts 0  PHQ-9 Score 1  Difficult doing work/chores Not difficult at all     Review of Systems  Constitutional: Negative.   HENT: Negative.   Eyes: Negative.   Respiratory: Negative.   Cardiovascular: Negative.   Gastrointestinal: Negative.   Endocrine: Negative.   Genitourinary: Negative.   Musculoskeletal: Positive for arthralgias, gait problem and myalgias.  Skin: Negative.   Allergic/Immunologic: Negative.   Neurological:  Positive for tremors and numbness.  Hematological: Negative.   Psychiatric/Behavioral: Positive for dysphoric mood. The patient is nervous/anxious.   All other systems reviewed and are negative.      Objective:   Physical Exam  Awake, alert, appropriate, constant moving L leg, NAD  Assessment & Plan:      1. Pt is a 63 yr old female with Sciatica and chronic low back pain/lumbar radiculopathy s/p 3 lumbar surgeries in 1990s. Also has severe anxiety- was on Xanax 0.5 mg prior- and on O2 full time and insomnia  2. Lyrica/Pregabalin- increasing 50 mg 3x/day- 1 tab in AM and 2 tabs at night.   3. Will give her refill of oxycodone today- is due- will write for #105 instead of #90  4. Will try Alprazolam 0.5 BID prn- will reduce dose to 2x/day as needed based on clinic policy with pain meds- and will write for a total of 6 months -to give her time to find psychiatrist/someone to write for it.   5. Stop Buspar  6. Increase Synthroid  To 100 mcg daily- don't take within 1 hour of food. Goal to get TSH to ~ 1 and it's currently 5.42  7. Don't make all med changes in 1 day- make 1 med change every 1-2 days.  8. Try to con't Remeron/Mirtazepine. If still has Symptoms of crawling skin, etc, stop it.   9. F/U in 4 weeks.  I spent a total of 25 minutes on appointment- more than 15 minutes on appointment going over changes as detailed above.

## 2019-01-14 ENCOUNTER — Ambulatory Visit (HOSPITAL_COMMUNITY): Payer: Medicare Other

## 2019-01-25 ENCOUNTER — Ambulatory Visit (HOSPITAL_COMMUNITY)
Admission: RE | Admit: 2019-01-25 | Discharge: 2019-01-25 | Disposition: A | Discharge status: Home / Self Care | Payer: Medicare Other | Source: Ambulatory Visit | Attending: Internal Medicine | Admitting: Internal Medicine

## 2019-01-25 ENCOUNTER — Other Ambulatory Visit: Payer: Self-pay

## 2019-01-25 DIAGNOSIS — Z1231 Encounter for screening mammogram for malignant neoplasm of breast: Secondary | ICD-10-CM | POA: Diagnosis not present

## 2019-01-26 NOTE — Progress Notes (Deleted)
Primary Care Physician: [name], [name], MD  Primary Gastroenterologist:  Barney Drain, MD   No chief complaint on file.   HPI: [name] is a 63 y.o. female here for follow up. Last seen in 10/2018. Seen in hospital back in 08/2018 for melena, anemia, abnormal LFTs. Anemia multifactorial in setting of chronic renal insufficiency/CHF, +/- evolving IDA. Abnormal LFTs felt to be due to ischemia. Negative hepatitis panel. LFTs returned to normal. EGD 10/2018 showed mild reactive gastropathy, negative for celiac. She was unable to provide details of when her last colonoscopy was, suspected to be done at [facility] but no records were found. She declined colonoscopy at last office visit.     Current Outpatient Medications  Medication Sig Dispense Refill  . albuterol (PROAIR HFA) 108 (90 Base) MCG/ACT inhaler Inhale 2 puffs into the lungs every 4 (four) hours as needed for wheezing or shortness of breath (cough). 18 g 1  . albuterol (PROVENTIL) (2.5 MG/3ML) 0.083% nebulizer solution Take 3 mLs (2.5 mg total) by nebulization every 4 (four) hours as needed for wheezing or shortness of breath (cough). 75 mL 12  . ALPRAZolam (XANAX) 0.5 MG tablet Take 1 tablet (0.5 mg total) by mouth 2 (two) times daily as needed for anxiety. 60 tablet 0  . aspirin EC 81 MG tablet Take 81 mg by mouth daily.    . budesonide-formoterol (SYMBICORT) 160-4.5 MCG/ACT inhaler Take 2 puffs first thing in am and then another 2 puffs about 12 hours later. 1 Inhaler 11  . DULoxetine (CYMBALTA) 60 MG capsule Take 1 capsule (60 mg total) by mouth 2 (two) times daily. 60 capsule 3  . fluticasone (FLONASE) 50 MCG/ACT nasal spray Place 1-2 sprays into both nostrils daily as needed for allergies.     . furosemide (LASIX) 20 MG tablet Take 10 mg by mouth daily as needed for edema.    Marland Kitchen levothyroxine (SYNTHROID) 100 MCG tablet Take 1 tablet (100 mcg total) by mouth daily. 30 tablet 11  . mirtazapine (REMERON) 15 MG  tablet Take 1 tablet (15 mg total) by mouth at bedtime. 30 tablet 5  . oxycodone (OXY-IR) 5 MG capsule Take 1 capsule (5 mg total) by mouth 3 (three) times daily as needed (for severe pain as needed). 105 capsule 0  . pantoprazole (PROTONIX) 40 MG tablet Take 1 tablet (40 mg total) by mouth 2 (two) times daily before a meal. 180 tablet 3  . pregabalin (LYRICA) 50 MG capsule Take 1 capsule (50 mg total) by mouth 3 (three) times daily. 90 capsule 5  . pseudoephedrine-guaifenesin (MUCINEX D) 60-600 MG 12 hr tablet Take 1 tablet by mouth daily.    . rosuvastatin (CRESTOR) 40 MG tablet Take 40 mg by mouth at bedtime.      No current facility-administered medications for this visit.    Allergies as of 01/27/2019 - Review Complete 01/11/2019  Allergen Reaction Noted  . Mirtazapine Other (See Comments) 01/11/2019  . Acetaminophen-codeine Nausea And Vomiting 04/19/2010  . Aspirin Nausea And Vomiting 04/19/2010  . Astelin [azelastine]  12/08/2018  . Bactrim [sulfamethoxazole-trimethoprim]  12/08/2018  . Celecoxib Other (See Comments) 04/19/2010  . Ciprofloxacin  12/08/2018  . Fentanyl  12/08/2018  . Levaquin [levofloxacin in d5w] Itching 02/21/2012  . Lipitor [atorvastatin] Other (See Comments) 04/22/2013  . Neomycin  12/08/2018  . Salicylates  [id number]  . Sulfa antibiotics Nausea And Vomiting 02/20/2012  . Adhesive [tape] Rash 03/06/2012  . Latex Rash 04/19/2010  .  Tegretol [carbamazepine] Rash 12/08/2018    ROS:  General: Negative for anorexia, weight loss, fever, chills, fatigue, weakness. ENT: Negative for hoarseness, difficulty swallowing , nasal congestion. CV: Negative for chest pain, angina, palpitations, dyspnea on exertion, peripheral edema.  Respiratory: Negative for dyspnea at rest, dyspnea on exertion, cough, sputum, wheezing.  GI: See history of present illness. GU:  Negative for dysuria, hematuria, urinary incontinence, urinary frequency, nocturnal urination.  Endo:  Negative for unusual weight change.    Physical Examination:   There were no vitals taken for this visit.  General: Well-nourished, well-developed in no acute distress.  Eyes: No icterus. Mouth: Oropharyngeal mucosa moist and pink , no lesions erythema or exudate. Lungs: Clear to auscultation bilaterally.  Heart: Regular rate and rhythm, no murmurs rubs or gallops.  Abdomen: Bowel sounds are normal, nontender, nondistended, no hepatosplenomegaly or masses, no abdominal bruits or hernia , no rebound or guarding.   Extremities: No lower extremity edema. No clubbing or deformities. Neuro: Alert and oriented x 4   Skin: Warm and dry, no jaundice.   Psych: Alert and cooperative, normal mood and affect.  Labs:  Lab Results  Component Value Date   CREATININE 1.06 12/08/2018   BUN 10 12/08/2018   NA 141 12/08/2018   K 3.9 12/08/2018   CL 98 12/08/2018   CO2 41 (H) 12/08/2018   Lab Results  Component Value Date   WBC 8.7 12/08/2018   HGB 11.7 (L) 12/08/2018   HCT 35.1 (L) 12/08/2018   MCV 93.5 12/08/2018   PLT 271.0 12/08/2018   Lab Results  Component Value Date   ALT 10 11/09/2018   AST 17 11/09/2018   ALKPHOS 83 11/09/2018   BILITOT 0.3 11/09/2018   Lab Results  Component Value Date   IRON 9 (L) 08/24/2018   TIBC 479 (H) 08/24/2018   FERRITIN 11 08/24/2018   Lab Results  Component Value Date   [id number] 08/26/2018   No results found for: FOLATE   Imaging Studies: MM 3D SCREEN BREAST BILATERAL  Result Date: 01/26/2019 CLINICAL DATA:  Screening. EXAM: DIGITAL SCREENING BILATERAL MAMMOGRAM WITH TOMO AND CAD COMPARISON:  Previous exam(s). ACR Breast Density Category b: There are scattered areas of fibroglandular density. FINDINGS: There are no findings suspicious for malignancy. Images were processed with CAD. IMPRESSION: No mammographic evidence of malignancy. A result letter of this screening mammogram will be mailed directly to the patient. RECOMMENDATION:  Screening mammogram in one year. (Code:SM-B-01Y) BI-RADS CATEGORY  1: Negative. Electronically Signed   By: Lillia Mountain M.D.   On: 01/26/2019 13:57

## 2019-01-27 ENCOUNTER — Ambulatory Visit: Payer: Medicare Other | Admitting: Gastroenterology

## 2019-01-27 DIAGNOSIS — J441 Chronic obstructive pulmonary disease with (acute) exacerbation: Secondary | ICD-10-CM | POA: Diagnosis not present

## 2019-02-08 ENCOUNTER — Other Ambulatory Visit: Payer: Self-pay

## 2019-02-08 ENCOUNTER — Ambulatory Visit (INDEPENDENT_AMBULATORY_CARE_PROVIDER_SITE_OTHER): Payer: Medicare Other | Admitting: Internal Medicine

## 2019-02-08 DIAGNOSIS — F17210 Nicotine dependence, cigarettes, uncomplicated: Secondary | ICD-10-CM | POA: Diagnosis not present

## 2019-02-08 DIAGNOSIS — J9612 Chronic respiratory failure with hypercapnia: Secondary | ICD-10-CM | POA: Diagnosis not present

## 2019-02-08 DIAGNOSIS — J9611 Chronic respiratory failure with hypoxia: Secondary | ICD-10-CM

## 2019-02-08 DIAGNOSIS — J449 Chronic obstructive pulmonary disease, unspecified: Secondary | ICD-10-CM | POA: Diagnosis not present

## 2019-02-08 NOTE — Progress Notes (Signed)
[name], female    DOB: 23-Oct-1955      MRN: [id number]   Brief patient profile:  64 yowf active smoker with onset somewhat limited by both breathing and back/ legs x 2017  With GOLD II criteria by spirometry  07/17/15   rx with proair avg sev times a week at most no maint rx then acutely ill for a few days weakness > sob  before admitted [place]  With new acute resp failure/ severe hypoxemia.      Admission date:  08/24/2018  Admitting Physician  Bethena Roys, MD  Discharge Date:  08/27/2018   Primary MD  Jani Gravel, MD  Recommendations for primary care physician for things to follow:   1)Very low-salt diet advised 2)Weigh yourself daily, call if you gain more than 3 pounds in 1 day or more than 5 pounds in 1 week as your diuretic medications may need to be adjusted 3)Limit your Fluid  intake to no more than 60 ounces (1.8 Liters) per day 4)Follow -up with gastroenterologist Dr. Barney Drain at Address: [address] [place], [place] [address] Phone: [phone]---- most likely need upper endoscopy and colonoscopy 5)Avoid ibuprofen/Advil/Aleve/Motrin/Goody Powders/Naproxen/BC powders/Meloxicam/Diclofenac/Indomethacin and other Nonsteroidal anti-inflammatory medications as these will make you more likely to bleed and can cause stomach ulcers, can also cause Kidney problems.  6) you need repeat CBC and BMP blood test around Tuesday, 09/01/2018 7) absolutely no smoking around oxygen--this related to fire, injury and Death   Admission Diagnosis  Hypokalemia [E87.6] Pleural effusion [J90] Hypoxia [R09.02] AKI (acute kidney injury) ([place]) [N17.9] Gastrointestinal hemorrhage, unspecified gastrointestinal hemorrhage type [K92.2] Anemia, unspecified type [D64.9] Depression, unspecified depression type [F32.9] Community acquired pneumonia of right lung, unspecified part of lung [J18.9]   Discharge Diagnosis  Hypokalemia [E87.6] Pleural effusion [J90] Hypoxia  [R09.02] AKI (acute kidney injury) ([place]) [N17.9] Gastrointestinal hemorrhage, unspecified gastrointestinal hemorrhage type [K92.2] Anemia, unspecified type [D64.9] Depression, unspecified depression type [F32.9] Community acquired pneumonia of right lung, unspecified part of lung [J18.9]    Principal Problem:   Acute respiratory failure with hypoxia ([place]) Active Problems:   Anxiety and depression   HTN (hypertension)   COPD exacerbation (H[facility])   Hypokalemia   Elevated LFTs   Anemia   Gastrointestinal hemorrhage          Past Medical History:  Diagnosis Date  . Anemia   . Anxiety and depression   . Bronchitis   . Cancer ([place])    uterus  . CHF (congestive heart failure) ([place])   . Chronic pain 11/22/2010  . COPD (chronic obstructive pulmonary disease) ([place])   . Gastroesophageal reflux disease 11/22/2010  . Hyperlipidemia   . Hypertension   . Hypothyroid   . Migraine headache 11/22/2010  . Near syncope   . Peripheral vascular disease ([place])   . Pneumonia   . Tobacco abuse    1/2 pack per day         Past Surgical History:  Procedure Laterality Date  . ABDOMINAL HYSTERECTOMY     without oophorectomy for neoplastic disease  . BACK SURGERY    . CARDIAC CATHETERIZATION  07/2013   normal coronary arteries  . CARPAL TUNNEL RELEASE     Left  . CHOLECYSTECTOMY  2007  . DILATION AND CURETTAGE OF UTERUS    . LAPAROTOMY    . LEFT HEART CATHETERIZATION WITH CORONARY ANGIOGRAM N/A 07/12/2013   Procedure: LEFT HEART CATHETERIZATION WITH CORONARY ANGIOGRAM;  Surgeon: Leonie Man, MD;  Location: [facility]  CATH LAB;  Service: Cardiovascular;  Laterality: N/A;  . LUMBAR LAMINECTOMY  x3   Left; complicated by neurologic dysfunction  . NASAL SINUS SURGERY       HPI  from the history and physical done on the day of admission:     Patient coming from:Home  I have personally briefly reviewed patient's old medical records in [place]  Link  Chief Complaint:Weakness  TDV:VOHYWV L [name] a 64 y.o.femalewith medical history significant forCHF, hypothyroidism, peripheral vascular disease, COPD, depression anxiety, hypertension, subclavian steal syndrome, CVA. At the time of my evaluation patient is alert and oriented,able to give me a history. Who presented to the ED with complaints of generalized weakness of 2 weeks,and confusion at night. Patient reports over the past 2 weeks she has barely eatenor drank anything that she has felt so depressed. No abdominal pain, no vomiting, she reports some occasional loose stools. She reports chronic difficulty breathing,but is unable to tell me if this has changed recently. She denies cough. No chest pain. No fever or chills. She reports bilateral lower extremity swelling of about 2 weeks duration now. She reports her normal weight is about 115pounds. She denies being on any fluid pills. She denies vomiting of blood, denies black stools or bloody stools. She denies NSAID use. She is unaware of a history of heart disease or stroke,though both of these are listed on her medical problems. Patient is supposed to be on CPAP at night and home oxygen,both of which she has not used in at least 4 years. Per patient spouse-he has been seeing blood in patient's underwear,and patient has been admitted to [facility] for same.  ED Course:Temperature 98. Respiratory rate 17-24, blood pressure systolic 1 1 7-1 2 9, O2 sats 68% on room air. Patient was initially lethargic and somewhat confused, this improved after patient was placed on 2 L nasal cannula she became more alert and oriented. After placing patient on O2 by nasal cannula, patient's been BNP elevated at 1260.Mild leukocytosis 12.4. Low potassium 2.6. Elevated creatinine 2.47. Elevated bicarb 33. Hemoglobin 7.9. EKG without significant change from prior. Stool occult positive. Portable chest x-ray shows  interval cardiomegaly and moderate-sized right pleural effusion with right basilar atelectasis or pneumonia. Patient started on IV ceftriaxone and azithromycin. Hospitalist to admit for community-acquired pneumonia.     Hospital Course:       A/p Acute respiratory failure with hypoxia-with reported dyspnea,---most likely due to pneumonia and CHF, patient continues to require supplemental oxygen, currently requiring 3 L via nasal cannula   HFpEF/dCHF history-decompensated. New 1+ pitting pedal edema bilaterally,marked elevated BNP 1260 ( was 67 fouryears ago)chest x-ray with moderate sized right pleural effusion, no echo on file.  PTA was Not on diuretics. She is unaware if she has gained weight. Denies chest pain. EKG unchanged.  -Diuresed well with Lasix,.  Okay to discharge home on p.o. Lasix -Echocardiogram with LVEF 50-55%  Rt Sided Pleural Effusion--- status post ultrasound-guided thoracentesis with removal of 600 mL of serous fluid, fluid studies pending, postthoracentesis chest x-ray shows significant improvement in right-sided pleural effusion and right lung aeration  Acute kidney injury-creatininewas2.47,improving, creatinine is down to 1.51,Last available creatinine 4 years ago baseline 0.6 - 0.7.-----  Generalized weakness-likely multifactorial-overall improved,  Metabolic encephalopathy-likely from hypoxia/hypercapnia due to CPAP non-compliance,and probably pneumonia.  -Mental statuschanges have resolved, patient is back to baseline  Acuteiron deficiencyanemia-hemoglobinis up to 9.6 post transfusion of 2 units of packed cells,Last check 4 years ago baseline 11-12. Stool occult  positive.She denies black stools or blood loss, orabdominal pain.Newton Pigg use. NoColonoscopy or EGD on file. Per patient spouse-he has been seeing blood in patient's underwear,and patient has been admitted to [facility] for  same. S/pFeraheme transfusion  - CBC a.m -GI following with possible EGD soon and records being obtained from [facility] --GI consult appreciated and recommend outpatient EGD with Dr. Katharine Look fields  Tobacco abuse/COPD exacerbation/OSA--Hypoxia persist, smoking cessation advised, continue bronchodilators and steroids, treated with Rocephin---Continue CPAP and supplemental oxygen  Anxiety/depression- reports depressed feelings over the past 2 weeks,which resulted in poor p.o. intake. Denies suicidal or homicidal ideations. -TTSpsychiatry consultation appreciated, they recommend outpatient follow-up  Disposition--- patient repeatedly came down to the nursing station, demanding to be discharged home or she would leave AMA. --It took a lot of pleading for patient to agree to right-sided thoracentesis prior to discharge --She wanted to be discharged home As possible refused to stay for further treatment or interventions --She repeatedly threatening to leave AMA so she was reluctantly discharged home with home O2 and home health services          History of Present Illness  12/08/2018  Pulmonary/ 1st office eval/Hazelyn Kallen  GOLD II/ still smoking on symb 10 'now and then"  And 02 24/7  Chief Complaint  Patient presents with  . Pulmonary Consult    Referred by Dr Deniece Ree. Pt c/o SOB since July 2020. She states she was hospitalized with CHF then and started on o2 3lpm continuous o2.   Dyspnea:  Better  But can't walk the dog yet, better on 02 3lpm cont Cough: none  Sleep: no resp symptoms  flat  SABA use: less than usual / nebulizer twice weekly at most  0 2 3lpm 24/7 does not titrate though has pulse ox rec Plan A = Automatic = Always=   Symbicort 160 Take 2 puffs first thing in am and then another 2 puffs about 12 hours later.  Work on inhaler technique: Plan B = Backup (to supplement plan A, not to replace it) Only use your albuterol inhaler as a rescue medication Plan C =  Crisis (instead of Plan B but only if Plan B stops working) - only use your albuterol nebulizer if you first try Plan B and it fails to help      Virtual Visit via Telephone Note 01/08/2019   I connected with [name] on 01/08/19 at  1:15PM EST by telephone and verified that I am speaking with the correct person using two identifiers.   I discussed the limitations, risks, security and privacy concerns of performing an evaluation and management service by telephone and the availability of in person appointments. I also discussed with the patient that there may be a patient responsible charge related to this service. The patient expressed understanding and agreed to proceed.   History of Present Illness: re previous gold II/ 02 dep s/p admit maint just symb 160 2bid /still smoking but cutting down Dyspnea:  Walking dog wearing 02  3lpm cont  As high as 5lpm  Cough: none now Sleeping: on side flat bed one pillow  SABA use: maybe 3 x daily "you said to stop  02: 3lpm rec Plan A = Automatic = Always=   Symbicort 160 Take 2 puffs first thing in am and then another 2 puffs about 12 hours later.  Work on inhaler technique: Plan B = Backup (to supplement plan A, not to replace it) Only use your albuterol inhaler as a rescue medication  Plan C = Crisis (instead of Plan B but only if Plan B stops working) - only use your albuterol nebulizer if you first try Plan B and it fails to help Goal is to keep your 02 saturation above 90%  The key is to stop smoking completely before smoking completely stops you!  Please schedule a follow up office visit in 4 weeks, sooner if needed  with all medications /inhalers/ solutions in hand so we can verify exactly what you are taking. This includes all medications from all doctors and over the counters   Virtual Visit via Telephone Note 02/08/2019  GOLD II/ still smoking d/c'd 02 on her own   I connected with [name] on 02/08/19 at  1:15 PM  EST by telephone and verified that I am speaking with the correct person using two identifiers.   I discussed the limitations, risks, security and privacy concerns of performing an evaluation and management service by telephone and the availability of in person appointments. I also discussed with the patient that there may be a patient responsible charge related to this service. The patient expressed understanding and agreed to proceed.   History of Present Illness: Dyspnea: room to room ok, very sedentary due to L sciatica  Cough: fairly dry day > noct  Sleeping: ok flat/ one pillow SABA use: no neb / no need  02: stopped on her own    No obvious day to day or daytime variability or assoc excess/ purulent sputum or mucus plugs or hemoptysis or cp or chest tightness, subjective wheeze or overt sinus or hb symptoms.    Also denies any obvious fluctuation of symptoms with weather or environmental changes or other aggravating or alleviating factors except as outlined above.   Meds reviewed/ med reconciliation completed      Past Medical History:  Diagnosis Date  . Anemia   . Anxiety and depression   . Bronchitis   . Cancer ([place])    uterus  . CHF (congestive heart failure) ([place])   . Chronic pain 11/22/2010  . COPD (chronic obstructive pulmonary disease) ([place])   . Coronary artery disease   . Gastroesophageal reflux disease 11/22/2010  . Hyperlipidemia   . Hypertension   . Hypothyroid   . Migraine headache 11/22/2010  . Near syncope   . On home oxygen therapy    started 08/2018  . Peripheral vascular disease ([place])   . Pneumonia   . Tobacco abuse    1/2 pack per day       Observations/Objective: Gravely voice, dry sounding cough, some mild conversational sob    Assessment and Plan: See problem list for active a/p's   Follow Up Instructions: See avs for instructions unique to this ov which includes revised/ updated med list     I discussed the assessment and  treatment plan with the patient. The patient was provided an opportunity to ask questions and all were answered. The patient agreed with the plan and demonstrated an understanding of the instructions.   The patient was advised to call back or seek an in-person evaluation if the symptoms worsen or if the condition fails to improve as anticipated.  I provided 15 minutes of non-face-to-face time during this encounter.   Christinia Gully, MD

## 2019-02-08 NOTE — Patient Instructions (Addendum)
Make sure you check your oxygen saturations at highest level of activity to be sure it stays over 90% and adjust upward to maintain this level if needed but remember to turn it back to previous settings when you stop (to conserve your supply).    No change in medications  - call for evaluation if you start needing the albuterol nebulizer more than you are now  Please schedule a follow up visit in 6  months but call sooner if needed

## 2019-02-10 ENCOUNTER — Encounter: Payer: Self-pay | Admitting: Internal Medicine

## 2019-02-10 NOTE — Assessment & Plan Note (Signed)
Counseled re importance of smoking cessation but did not meet time criteria for separate billing     Each maintenance medication was reviewed in detail including most importantly the difference between maintenance and as needed and under what circumstances the prns are to be used.  Please see AVS for specific  Instructions which are unique to this visit and I personally typed out  which were reviewed in detail over the phone with the patient and a copy provided via mail     

## 2019-02-10 NOTE — Assessment & Plan Note (Signed)
Active smoker - Spirometry 07/17/15   FEV1 1.28 (53%)  Ratio 0.58 classic concave contour to f/v loop  - 12/08/2018  After extensive coaching inhaler device,  effectiveness =    75% try increase symb to 80 2bid   -  Alpha one AT screen 12/08/2018  MM   Level 159   Mostly AB pattern / still actively smoking so need to work harder on that and consider changing to breztri if having any significant flares or worse breathing / need for saba on symb 160 2bid  Pt informed of the seriousness of COVID 19 infection as a direct risk to lung health  and safey and to close contacts and should continue to wear a facemask in public and minimize exposure to public locations but especially avoid any area or activity where non-close contacts are not observing distancing or wearing an appropriate face mask.  I strongly recommended vaccine when offered.    >>> f/u q  6  m to limit covid 19 exp

## 2019-02-10 NOTE — Assessment & Plan Note (Signed)
D/c from [facility] 08/27/18 with HC03 30 on 3lpm  12/08/2018  Patient Saturations on Room Air at Rest = 94%, while Ambulating = 87% Patient Saturations on 3  Liters of pulsed oxygen while Ambulating = 95% - no longer using 02 as of 02/08/2019   She stopped this on her own I have advised her to keep track of her saturations even if she is feeling good because she may suffer the consequences of chronic hypoxemia (cor pulmonale).

## 2019-02-12 ENCOUNTER — Encounter: Payer: Self-pay | Admitting: Physical Medicine and Rehabilitation

## 2019-02-12 ENCOUNTER — Other Ambulatory Visit: Payer: Self-pay

## 2019-02-12 ENCOUNTER — Encounter
Payer: Medicare Other | Attending: Physical Medicine and Rehabilitation | Admitting: Physical Medicine and Rehabilitation

## 2019-02-12 VITALS — BP 115/47 | HR 92 | Temp 97.8°F | Ht 62.0 in | Wt 115.0 lb

## 2019-02-12 DIAGNOSIS — M94269 Chondromalacia, unspecified knee: Secondary | ICD-10-CM | POA: Diagnosis not present

## 2019-02-12 DIAGNOSIS — G894 Chronic pain syndrome: Secondary | ICD-10-CM | POA: Diagnosis not present

## 2019-02-12 DIAGNOSIS — F17210 Nicotine dependence, cigarettes, uncomplicated: Secondary | ICD-10-CM

## 2019-02-12 DIAGNOSIS — Z79891 Long term (current) use of opiate analgesic: Secondary | ICD-10-CM | POA: Insufficient documentation

## 2019-02-12 DIAGNOSIS — I10 Essential (primary) hypertension: Secondary | ICD-10-CM | POA: Diagnosis not present

## 2019-02-12 DIAGNOSIS — G8929 Other chronic pain: Secondary | ICD-10-CM | POA: Insufficient documentation

## 2019-02-12 DIAGNOSIS — Z5181 Encounter for therapeutic drug level monitoring: Secondary | ICD-10-CM | POA: Insufficient documentation

## 2019-02-12 DIAGNOSIS — M5432 Sciatica, left side: Secondary | ICD-10-CM

## 2019-02-12 DIAGNOSIS — M5416 Radiculopathy, lumbar region: Secondary | ICD-10-CM | POA: Diagnosis not present

## 2019-02-12 DIAGNOSIS — E039 Hypothyroidism, unspecified: Secondary | ICD-10-CM | POA: Diagnosis not present

## 2019-02-12 MED ORDER — ALPRAZOLAM 0.5 MG PO TABS: 0.5000 mg | ORAL_TABLET | Freq: Two times a day (BID) | ORAL | 0 refills | Status: DC | PRN

## 2019-02-12 MED ORDER — OXYCODONE HCL 5 MG PO CAPS: 5.0000 mg | ORAL_CAPSULE | Freq: Four times a day (QID) | ORAL | 0 refills | Status: DC | PRN

## 2019-02-12 MED ORDER — DULOXETINE HCL 60 MG PO CPEP: 60.0000 mg | ORAL_CAPSULE | Freq: Two times a day (BID) | ORAL | 11 refills | Status: DC

## 2019-02-12 NOTE — Patient Instructions (Signed)
HPI  1. Pt is a 64 yr old female with Sciatica and chronic low back pain/lumbar radiculopathy s/p 3 lumbar surgeries in 1990s.Also has severe anxiety- was on Xanax 0.5 mg prior- and was on O2 full time and insomnia and hypothyroidism with increased Synthroid.   1. Off O2 completely-per Pulmonary- supposedly shouldn't have stopped yet.  2. Went over R knee xray- mild tricompartment narrowing  3. Refilled Cymbalta 120 mg daily, Oxycodone #120 (insurance won't let her get Rx on a odd number) and 0.5 mg Xanax #60  4. Con't Lyrica and Synthroid 100 mcg- hair still falling out.  5. Will need to check TSH at next visit since will have been 8 weeks since dose change.  6. [place] so [place]'t [place] computer/way to do Mychart  7.  1 pack of cigarettes last 1 week. Husband blowing cigarettes in face and smokes 2ppd- got humidifier for dry air.  8. F/U in 4 weeks.

## 2019-02-12 NOTE — Progress Notes (Signed)
Subjective:    Patient ID: [name], female    DOB: 06-19-1955, 64 y.o.   MRN: [id number]  HPI   HPI  1. Pt is a 64 yr old female with Sciatica and chronic low back pain/lumbar radiculopathy s/p 3 lumbar surgeries in 1990s.Also has severe anxiety- was on Xanax 0.5 mg prior- and on O2 full time and insomnia   Needs refills of Duloxetine, Oxycodone and Xanax Nothing else  Hair still coming out- all the time. A lot more than normal per pt.  Had a traumatic event this AM- doesn't want to talk about it.      Pain Inventory Average Pain 9 Pain Right Now 7 My pain is sharp and stabbing  In the last 24 hours, has pain interfered with the following? General activity 8 Relation with others 0 Enjoyment of life 9 What TIME of day is your pain at its worst? . Sleep (in general) Fair  Pain is worse with: bending, sitting and standing Pain improves with: medication and TENS Relief from Meds: 6  Mobility ability to climb steps?  yes do you drive?  yes  Function Do you have any goals in this area?  no  Neuro/Psych weakness numbness dizziness depression anxiety  Prior Studies Any changes since last visit?  no  Physicians involved in your care Any changes since last visit?  no   Family History  Problem Relation Age of Onset  . Colon polyps Mother   . Deep vein thrombosis Mother   . Heart disease Mother   . Hypertension Mother   . Bleeding Disorder Mother   . Hypertension Sister   . Heart disease Brother        before age 4  . Hypertension Brother    Social History   Socioeconomic History  . Marital status: Married    Spouse name: Not on file  . Number of children: 2  . Years of education: Not on file  . Highest education level: Not on file  Occupational History    Comment: Disabled  Tobacco Use  . Smoking status: Current Every Day Smoker    Packs/day: 0.25    Years: 40.00    Pack years: 10.00    Types: Cigarettes    Start date: 09/06/1972   . Smokeless tobacco: Never Used  Substance and Sexual Activity  . Alcohol use: No    Alcohol/week: 0.0 standard drinks  . Drug use: No  . Sexual activity: Not Currently    Birth control/protection: Surgical  Other Topics Concern  . Not on file  Social History Narrative  . Not on file   Social Determinants of Health   Financial Resource Strain:   . Difficulty of Paying Living Expenses: Not on file  Food Insecurity:   . Worried About Charity fundraiser in the Last Year: Not on file  . Ran Out of Food in the Last Year: Not on file  Transportation Needs:   . Lack of Transportation (Medical): Not on file  . Lack of Transportation (Non-Medical): Not on file  Physical Activity:   . Days of Exercise per Week: Not on file  . Minutes of Exercise per Session: Not on file  Stress:   . Feeling of Stress : Not on file  Social Connections:   . Frequency of Communication with Friends and Family: Not on file  . Frequency of Social Gatherings with Friends and Family: Not on file  . Attends Religious Services: Not on file  .  Active Member of Clubs or Organizations: Not on file  . Attends Archivist Meetings: Not on file  . Marital Status: Not on file   Past Surgical History:  Procedure Laterality Date  . ABDOMINAL HYSTERECTOMY     without oophorectomy for neoplastic disease  . BACK SURGERY    . BIOPSY  10/22/2018   Procedure: BIOPSY;  Surgeon: Danie Binder, MD;  [facility];  Service: Endoscopy;;  duodenal, gastric  . CARDIAC CATHETERIZATION  07/2013   normal coronary arteries  . CARPAL TUNNEL RELEASE     Left  . CHOLECYSTECTOMY  2007  . DILATION AND CURETTAGE OF UTERUS    . ESOPHAGOGASTRODUODENOSCOPY (EGD) WITH PROPOFOL N/A 10/22/2018   gastritis (mild reactive gastropathy, negative duodenal biopsies for celiac).   Marland Kitchen LAPAROTOMY    . LEFT HEART CATHETERIZATION WITH CORONARY ANGIOGRAM N/A 07/12/2013   Procedure: LEFT HEART CATHETERIZATION WITH CORONARY  ANGIOGRAM;  Surgeon: Leonie Man, MD;  Location: [facility] CATH LAB;  Service: Cardiovascular;  Laterality: N/A;  . LUMBAR LAMINECTOMY  x3   Left; complicated by neurologic dysfunction  . NASAL SINUS SURGERY     Past Medical History:  Diagnosis Date  . Anemia   . Anxiety and depression   . Bronchitis   . Cancer ([place])    uterus  . CHF (congestive heart failure) ([place])   . Chronic pain 11/22/2010  . COPD (chronic obstructive pulmonary disease) ([place])   . Coronary artery disease   . Gastroesophageal reflux disease 11/22/2010  . Hyperlipidemia   . Hypertension   . Hypothyroid   . Migraine headache 11/22/2010  . Near syncope   . On home oxygen therapy    started 08/2018  . Peripheral vascular disease ([place])   . Pneumonia   . Tobacco abuse    1/2 pack per day   Temp 97.8 F (36.6 C)   Opioid Risk Score:   Fall Risk Score:  `1  Depression screen PHQ 2/9  Depression screen PHQ 2/9 09/17/2018  Decreased Interest 0  Down, Depressed, Hopeless 0  PHQ - 2 Score 0  Altered sleeping 0  Tired, decreased energy 0  Change in appetite 1  Feeling bad or failure about yourself  0  Trouble concentrating 0  Moving slowly or fidgety/restless 0  Suicidal thoughts 0  PHQ-9 Score 1  Difficult doing work/chores Not difficult at all     Review of Systems  Constitutional: Positive for diaphoresis.  HENT: Negative.   Eyes: Negative.   Respiratory: Positive for shortness of breath.   Cardiovascular: Negative.   Gastrointestinal: Negative.   Endocrine: Negative.   Genitourinary: Negative.   Musculoskeletal: Positive for arthralgias and back pain.  Skin: Negative.   Allergic/Immunologic: Negative.   Neurological: Positive for dizziness, weakness and numbness.  Hematological: Negative.   Psychiatric/Behavioral: Positive for dysphoric mood. The patient is nervous/anxious.   All other systems reviewed and are negative.      Objective:   Physical Exam Awake, alert, appropriate, but  shaking in UEs constantly, NAD Cries when starts talking about it TTP across low back in band No change in pain control       Assessment & Plan:   HPI  1. Pt is a 64 yr old female with Sciatica and chronic low back pain/lumbar radiculopathy s/p 3 lumbar surgeries in 1990s.Also has severe anxiety- was on Xanax 0.5 mg prior- and was on O2 full time and insomnia and hypothyroidism with increased Synthroid.   1. Off O2  completely-per Pulmonary- supposedly shouldn't have stopped yet.  2. Went over R knee xray- mild tricompartment narrowing  3. Refilled Cymbalta 120 mg daily, Oxycodone #120 (inslurance won't let her get Rx on a odd number) and 0.5 mg Xanax #60  4. Con't Lyrica and Synthroid 100 mcg- hair still falling out.  5. Will need to check TSH at next visit since will have been 8 weeks since dose change.  6. [place] computer/way to do Mychart  7.  1 pack of cigarettes last 1 week. Husband blowing cigarettes in face and smokes 2ppd- got humidifier for dry air.  8. F/U in 4 weeks.   I spent a total of 36 minutes- more than 20 minutes going over concerns with anxiety.

## 2019-02-17 ENCOUNTER — Other Ambulatory Visit: Payer: Self-pay

## 2019-02-17 ENCOUNTER — Encounter: Payer: Self-pay | Admitting: Gastroenterology

## 2019-02-17 ENCOUNTER — Ambulatory Visit (INDEPENDENT_AMBULATORY_CARE_PROVIDER_SITE_OTHER): Payer: Medicare Other | Admitting: Gastroenterology

## 2019-02-17 DIAGNOSIS — D509 Iron deficiency anemia, unspecified: Secondary | ICD-10-CM | POA: Diagnosis not present

## 2019-02-17 DIAGNOSIS — K2970 Gastritis, unspecified, without bleeding: Secondary | ICD-10-CM | POA: Diagnosis not present

## 2019-02-17 DIAGNOSIS — K2990 Gastroduodenitis, unspecified, without bleeding: Secondary | ICD-10-CM

## 2019-02-17 NOTE — Progress Notes (Signed)
Primary Care Physician: [name], [name], MD  Primary Gastroenterologist:  Barney Drain, MD   Chief Complaint  Patient presents with  . Gastroesophageal Reflux    doing ok    HPI: [name] is a 64 y.o. female here for follow-up.  She was seen in September 2020 for hospital follow-up with history of decompensated CHF, acute kidney failure, anemia with hemoglobin 7.9, melena, heme positive stools.  Anemia multifactorial in the setting of chronic renal insufficiency/CHF, plus or minus evolving IDA.  While inpatient, elevated LFTs felt to be due to ischemia.  Negative hepatitis panel.  EGD in September with mild reactive gastropathy, negative small bowel biopsies.  Last colonoscopy possibly at [facility].  We attempted records but none received.  At last office visit she declined further colonoscopy.  Overall feeling well.  Mild constipation well managed with a stool softener. No heartburn, on pantoprazole Bid. No n/v.  No dysphagia.  No melena or rectal bleeding.  Current Outpatient Medications  Medication Sig Dispense Refill  . albuterol (PROAIR HFA) 108 (90 Base) MCG/ACT inhaler Inhale 2 puffs into the lungs every 4 (four) hours as needed for wheezing or shortness of breath (cough). 18 g 1  . albuterol (PROVENTIL) (2.5 MG/3ML) 0.083% nebulizer solution Take 3 mLs (2.5 mg total) by nebulization every 4 (four) hours as needed for wheezing or shortness of breath (cough). 75 mL 12  . ALPRAZolam (XANAX) 0.5 MG tablet Take 1 tablet (0.5 mg total) by mouth 2 (two) times daily as needed for anxiety. 60 tablet 0  . aspirin EC 81 MG tablet Take 81 mg by mouth daily.    . budesonide-formoterol (SYMBICORT) 160-4.5 MCG/ACT inhaler Take 2 puffs first thing in am and then another 2 puffs about 12 hours later. 1 Inhaler 11  . DULoxetine (CYMBALTA) 60 MG capsule Take 1 capsule (60 mg total) by mouth 2 (two) times daily. 120 capsule 11  . fluticasone (FLONASE) 50 MCG/ACT nasal  spray Place 1-2 sprays into both nostrils daily as needed for allergies.     . furosemide (LASIX) 20 MG tablet Take 10 mg by mouth daily as needed for edema.    Marland Kitchen levothyroxine (SYNTHROID) 100 MCG tablet Take 1 tablet (100 mcg total) by mouth daily. 30 tablet 11  . mirtazapine (REMERON) 15 MG tablet Take 1 tablet (15 mg total) by mouth at bedtime. 30 tablet 5  . oxycodone (OXY-IR) 5 MG capsule Take 1 capsule (5 mg total) by mouth every 6 (six) hours as needed (for severe pain as needed). 120 capsule 0  . pantoprazole (PROTONIX) 40 MG tablet Take 1 tablet (40 mg total) by mouth 2 (two) times daily before a meal. 180 tablet 3  . pregabalin (LYRICA) 50 MG capsule Take 1 capsule (50 mg total) by mouth 3 (three) times daily. 90 capsule 5  . pseudoephedrine-guaifenesin (MUCINEX D) 60-600 MG 12 hr tablet Take 1 tablet by mouth daily.    . rosuvastatin (CRESTOR) 40 MG tablet Take 40 mg by mouth at bedtime.      No current facility-administered medications for this visit.    Allergies as of 02/17/2019 - Review Complete 02/12/2019  Allergen Reaction Noted  . Mirtazapine Other (See Comments) 01/11/2019  . Acetaminophen-codeine Nausea And Vomiting 04/19/2010  . Aspirin Nausea And Vomiting 04/19/2010  . Astelin [azelastine]  12/08/2018  . Bactrim [sulfamethoxazole-trimethoprim]  12/08/2018  . Celecoxib Other (See Comments) 04/19/2010  . Ciprofloxacin  12/08/2018  . Fentanyl  12/08/2018  .  Levaquin [levofloxacin in d5w] Itching 02/21/2012  . Lipitor [atorvastatin] Other (See Comments) 04/22/2013  . Neomycin  12/08/2018  . Salicylates  [id number]  . Sulfa antibiotics Nausea And Vomiting 02/20/2012  . Adhesive [tape] Rash 03/06/2012  . Latex Rash 04/19/2010  . Tegretol [carbamazepine] Rash 12/08/2018    ROS:  General: Negative for anorexia, weight loss, fever, chills, fatigue, weakness. ENT: Negative for hoarseness, difficulty swallowing , nasal congestion. CV: Negative for chest pain, angina,  palpitations, dyspnea on exertion, peripheral edema.  Respiratory: Negative for dyspnea at rest, dyspnea on exertion, cough, sputum, wheezing.  GI: See history of present illness. GU:  Negative for dysuria, hematuria, urinary incontinence, urinary frequency, nocturnal urination.  Endo: Negative for unusual weight change.    Physical Examination:   BP (!) 143/66   Pulse 78   Temp (!) 96.9 F (36.1 C) (Temporal)   Ht 5\' 2"  (1.575 m)   Wt 125 lb 12.8 oz (57.1 kg)   BMI 23.01 kg/m   General: Well-nourished, well-developed in no acute distress.  Eyes: No icterus. Mouth: Oropharyngeal mucosa moist and pink , no lesions erythema or exudate. Lungs: Clear to auscultation bilaterally.  Heart: Regular rate and rhythm, no murmurs rubs or gallops.  Abdomen: Bowel sounds are normal, nontender, nondistended, no hepatosplenomegaly or masses, no abdominal bruits or hernia , no rebound or guarding.   Extremities: No lower extremity edema. No clubbing or deformities. Neuro: Alert and oriented x 4   Skin: Warm and dry, no jaundice.   Psych: Alert and cooperative, normal mood and affect.  Labs:  Lab Results  Component Value Date   WBC 8.7 12/08/2018   HGB 11.7 (L) 12/08/2018   HCT 35.1 (L) 12/08/2018   MCV 93.5 12/08/2018   PLT 271.0 12/08/2018   Lab Results  Component Value Date   CREATININE 1.06 12/08/2018   BUN 10 12/08/2018   NA 141 12/08/2018   K 3.9 12/08/2018   CL 98 12/08/2018   CO2 41 (H) 12/08/2018   Lab Results  Component Value Date   ALT 10 11/09/2018   AST 17 11/09/2018   ALKPHOS 83 11/09/2018   BILITOT 0.3 11/09/2018   Lab Results  Component Value Date   IRON 9 (L) 08/24/2018   TIBC 479 (H) 08/24/2018   FERRITIN 11 08/24/2018   Lab Results  Component Value Date   TSH 5.380 (H) 12/18/2018    Imaging Studies: MM 3D SCREEN BREAST BILATERAL  Result Date: 01/26/2019 CLINICAL DATA:  Screening. EXAM: DIGITAL SCREENING BILATERAL MAMMOGRAM WITH TOMO AND CAD  COMPARISON:  Previous exam(s). ACR Breast Density Category b: There are scattered areas of fibroglandular density. FINDINGS: There are no findings suspicious for malignancy. Images were processed with CAD. IMPRESSION: No mammographic evidence of malignancy. A result letter of this screening mammogram will be mailed directly to the patient. RECOMMENDATION: Screening mammogram in one year. (Code:SM-B-01Y) BI-RADS CATEGORY  1: Negative. Electronically Signed   By: Lillia Mountain M.D.   On: 01/26/2019 13:57

## 2019-02-17 NOTE — Patient Instructions (Addendum)
1. We will retrieve copy of your last colonoscopy report and if any additional work up needed we will let you know.  2. Update labs in 04/2019 for anemia. 3. Continue pantoprazole 40mg  twice a day before breakfast and evening meal. 4. Return to the office in six months.

## 2019-02-18 NOTE — Assessment & Plan Note (Signed)
Feeling much better.  Her typical reflux is well controlled now.  Continue pantoprazole twice daily for now.  Reevaluate in 6 months.

## 2019-02-18 NOTE — Assessment & Plan Note (Signed)
Hemoglobin much better November, up to 11.7.  EGD as outlined above.  It appears that she may have had a colonoscopy in 2018 through [facility], we have requested records.  She will update labs in March including CBC, iron/TIBC, ferritin.  Return to the office in 6 months.

## 2019-02-22 NOTE — Progress Notes (Signed)
Cc'ed to pcp °

## 2019-02-27 DIAGNOSIS — J441 Chronic obstructive pulmonary disease with (acute) exacerbation: Secondary | ICD-10-CM | POA: Diagnosis not present

## 2019-03-11 ENCOUNTER — Telehealth: Payer: Self-pay | Admitting: Gastroenterology

## 2019-03-11 NOTE — Telephone Encounter (Signed)
Please let pt know that I retrieved colonoscopy and path from 10/2016 colonoscopy with Dr. Therisa Doyne.  She had total of 11 polyps. 8 tubular adenomas. One sessile serrated polyp removed piecemeal measuring almost 2cm. Advised to have one year follow up colonoscopy.  Patient is at increased risk of colon cancer due to numerous polyps as outlined.   Advise colonoscopy with proprofol with SLF. Schedule if patient agrees. Recommend dulcolax 10mg  daily for 3 days before procedure in addition to standard split prep.

## 2019-03-12 ENCOUNTER — Encounter
Payer: Medicare Other | Attending: Physical Medicine and Rehabilitation | Admitting: Physical Medicine and Rehabilitation

## 2019-03-12 ENCOUNTER — Ambulatory Visit: Payer: Medicare Other | Admitting: Physical Medicine and Rehabilitation

## 2019-03-12 ENCOUNTER — Encounter: Payer: Self-pay | Admitting: Physical Medicine and Rehabilitation

## 2019-03-12 ENCOUNTER — Other Ambulatory Visit: Payer: Self-pay

## 2019-03-12 VITALS — BP 95/59 | HR 75 | Temp 97.5°F | Ht 62.0 in | Wt 129.0 lb

## 2019-03-12 DIAGNOSIS — M5416 Radiculopathy, lumbar region: Secondary | ICD-10-CM | POA: Insufficient documentation

## 2019-03-12 DIAGNOSIS — M94269 Chondromalacia, unspecified knee: Secondary | ICD-10-CM | POA: Diagnosis not present

## 2019-03-12 DIAGNOSIS — E039 Hypothyroidism, unspecified: Secondary | ICD-10-CM | POA: Diagnosis not present

## 2019-03-12 DIAGNOSIS — G8929 Other chronic pain: Secondary | ICD-10-CM | POA: Insufficient documentation

## 2019-03-12 DIAGNOSIS — Z5181 Encounter for therapeutic drug level monitoring: Secondary | ICD-10-CM | POA: Diagnosis not present

## 2019-03-12 DIAGNOSIS — G894 Chronic pain syndrome: Secondary | ICD-10-CM

## 2019-03-12 DIAGNOSIS — M5432 Sciatica, left side: Secondary | ICD-10-CM | POA: Diagnosis not present

## 2019-03-12 DIAGNOSIS — Z79891 Long term (current) use of opiate analgesic: Secondary | ICD-10-CM | POA: Diagnosis not present

## 2019-03-12 MED ORDER — OXYCODONE HCL 5 MG PO CAPS: 5.0000 mg | ORAL_CAPSULE | Freq: Four times a day (QID) | ORAL | 0 refills | Status: DC | PRN

## 2019-03-12 MED ORDER — ALPRAZOLAM 0.5 MG PO TABS: 0.5000 mg | ORAL_TABLET | Freq: Two times a day (BID) | ORAL | 0 refills | Status: DC | PRN

## 2019-03-12 NOTE — Patient Instructions (Signed)
1. Pt is a 64 yr old female with Sciatica and chronic low back pain/lumbar radiculopathy s/p 3 lumbar surgeries in 1990s.Also has severe anxiety- was on Xanax 0.5 mg prior- and was on O2 full time and insomnia and hypothyroidism with increased Synthroid.   1. TSH checked today. Will place order  2. Concerned doesn't want to go up on pain meds because sister steals meds a lot. Doesn't let her in the house anymore.   3. Will con't Oxycodone- #120 5 mg ox and Xanax 05. Mg BID prn as needed- #60- due to be refilled 2/7.   4. F/U 3 months

## 2019-03-12 NOTE — Progress Notes (Signed)
Subjective:    Patient ID: [name], female    DOB: [birth year], 64 y.o.   MRN: [id number]  HPI 1. Pt is a 64 yr old female with Sciatica and chronic low back pain/lumbar radiculopathy s/p 3 lumbar surgeries in 1990s.Also has severe anxiety- was on Xanax 0.5 mg prior- and was on O2 full time and insomnia and hypothyroidism with increased Synthroid.   Has been a little more active- and checking O2 sats when does so. Walking to mailbox- like walking to  parking lot to get it all done- taking dog-O2 sats 86-97% usually when walks.   Overdoing it at the house.    Has not been sleeping- at all- too nervous.   Is taking Zyrtec as well for allergies.   When hurting has to keep moving- bad weather lately has made pain worse.  Pain has been a lot worse due to cold weather.  Feels comfortable on current dose right now- doesn't want ot increase it.     Pain Inventory Average Pain 8 Pain Right Now 6 My pain is sharp, stabbing and tingling  In the last 24 hours, has pain interfered with the following? General activity 7 Relation with others 7 Enjoyment of life 7 What TIME of day is your pain at its worst? all Sleep (in general) Poor  Pain is worse with: bending, sitting, inactivity, standing and some activites Pain improves with: medication Relief from Meds: 7  Mobility walk without assistance ability to climb steps?  yes do you drive?  yes transfers alone Do you have any goals in this area?  yes  Function disabled: date disabled .  Neuro/Psych weakness numbness tingling spasms depression anxiety  Prior Studies Any changes since last visit?  no  Physicians involved in your care Any changes since last visit?  no   Family History  Problem Relation Age of Onset  . Colon polyps Mother   . Deep vein thrombosis Mother   . Heart disease Mother   . Hypertension Mother   . Bleeding Disorder Mother   . Hypertension Sister   . Heart disease Brother    before age 48  . Hypertension Brother    Social History   Socioeconomic History  . Marital status: Married    Spouse name: Not on file  . Number of children: 2  . Years of education: Not on file  . Highest education level: Not on file  Occupational History    Comment: Disabled  Tobacco Use  . Smoking status: Current Every Day Smoker    Packs/day: 0.25    Years: 40.00    Pack years: 10.00    Types: Cigarettes    Start date: 09/06/1972  . Smokeless tobacco: Never Used  Substance and Sexual Activity  . Alcohol use: No    Alcohol/week: 0.0 standard drinks  . Drug use: No  . Sexual activity: Not Currently    Birth control/protection: Surgical  Other Topics Concern  . Not on file  Social History Narrative  . Not on file   Social Determinants of Health   Financial Resource Strain:   . Difficulty of Paying Living Expenses: Not on file  Food Insecurity:   . Worried About Charity fundraiser in the Last Year: Not on file  . Ran Out of Food in the Last Year: Not on file  Transportation Needs:   . Lack of Transportation (Medical): Not on file  . Lack of Transportation (Non-Medical): Not on file  Physical Activity:   .  Days of Exercise per Week: Not on file  . Minutes of Exercise per Session: Not on file  Stress:   . Feeling of Stress : Not on file  Social Connections:   . Frequency of Communication with Friends and Family: Not on file  . Frequency of Social Gatherings with Friends and Family: Not on file  . Attends Religious Services: Not on file  . Active Member of Clubs or Organizations: Not on file  . Attends Archivist Meetings: Not on file  . Marital Status: Not on file   Past Surgical History:  Procedure Laterality Date  . ABDOMINAL HYSTERECTOMY     without oophorectomy for neoplastic disease  . BACK SURGERY    . BIOPSY  10/22/2018   Procedure: BIOPSY;  Surgeon: Danie Binder, MD;  Location: [facility];  Service: Endoscopy;;  duodenal, gastric  .  CARDIAC CATHETERIZATION  07/2013   normal coronary arteries  . CARPAL TUNNEL RELEASE     Left  . CHOLECYSTECTOMY  2007  . DILATION AND CURETTAGE OF UTERUS    . ESOPHAGOGASTRODUODENOSCOPY (EGD) WITH PROPOFOL N/A 10/22/2018   gastritis (mild reactive gastropathy, negative duodenal biopsies for celiac).   Marland Kitchen LAPAROTOMY    . LEFT HEART CATHETERIZATION WITH CORONARY ANGIOGRAM N/A 07/12/2013   Procedure: LEFT HEART CATHETERIZATION WITH CORONARY ANGIOGRAM;  Surgeon: Leonie Man, MD;  Location: [facility] CATH LAB;  Service: Cardiovascular;  Laterality: N/A;  . LUMBAR LAMINECTOMY  x3   Left; complicated by neurologic dysfunction  . NASAL SINUS SURGERY     Past Medical History:  Diagnosis Date  . Anemia   . Anxiety and depression   . Bronchitis   . Cancer ([place])    uterus  . CHF (congestive heart failure) ([place])   . Chronic pain 11/22/2010  . COPD (chronic obstructive pulmonary disease) ([place])   . Coronary artery disease   . Gastroesophageal reflux disease 11/22/2010  . Hyperlipidemia   . Hypertension   . Hypothyroid   . Migraine headache 11/22/2010  . Near syncope   . On home oxygen therapy    started 08/2018  . Peripheral vascular disease ([place])   . Pneumonia   . Tobacco abuse    1/2 pack per day   BP (!) 95/59   Pulse 75   Temp (!) 97.5 F (36.4 C)   Ht 5\' 2"  (1.575 m)   Wt 129 lb (58.5 kg)   SpO2 (!) 84%   BMI 23.59 kg/m   Opioid Risk Score:   Fall Risk Score:  `1  Depression screen PHQ 2/9  Depression screen PHQ 2/9 09/17/2018  Decreased Interest 0  Down, Depressed, Hopeless 0  PHQ - 2 Score 0  Altered sleeping 0  Tired, decreased energy 0  Change in appetite 1  Feeling bad or failure about yourself  0  Trouble concentrating 0  Moving slowly or fidgety/restless 0  Suicidal thoughts 0  PHQ-9 Score 1  Difficult doing work/chores Not difficult at all    Review of Systems  Constitutional: Negative.   HENT: Negative.   Eyes: Negative.   Respiratory: Negative.    Cardiovascular: Negative.   Gastrointestinal: Negative.   Endocrine: Negative.   Genitourinary: Negative.   Musculoskeletal: Positive for arthralgias, back pain and gait problem.       Spasms  Skin: Negative.   Allergic/Immunologic: Negative.   Neurological: Positive for weakness and numbness.  Psychiatric/Behavioral: Positive for dysphoric mood. The patient is nervous/anxious.   All other systems reviewed  and are negative.      Objective:   Physical Exam  Awake, alert, appropriate, but shaking and moving and can't get comfortable; NAD  less anxious than was at last visit. Slightly brighter affect than last few visits. Still very TTP across low back in band.       Assessment & Plan:   1. Pt is a 64 yr old female with Sciatica and chronic low back pain/lumbar radiculopathy s/p 3 lumbar surgeries in 1990s.Also has severe anxiety- was on Xanax 0.5 mg prior- and was on O2 full time and insomnia and hypothyroidism with increased Synthroid.   1. TSH checked today. Will place order  2. Concerned doesn't want to go up on pain meds because sister steals meds a lot. Doesn't let her in the house anymore.   3. Will con't Oxycodone- #120 5 mg ox and Xanax 05. Mg BID prn as needed- #60- due to be refilled 2/7.   4. F/U 3 months  I spent a total of 20 minutes on appointment- more than 10 minutes going over thyroid and education on signs/symptoms- sounds euthyroid currently.

## 2019-03-15 NOTE — Telephone Encounter (Signed)
LMOM to call.

## 2019-03-16 LAB — DRUG TOX MONITOR 1 W/CONF, ORAL FLD
Alprazolam: 4.71 ng/mL — ABNORMAL HIGH (ref <0.50)
Amphetamines: NEGATIVE ng/mL (ref <10)
Barbiturates: NEGATIVE ng/mL (ref <10)
Benzodiazepines: POSITIVE ng/mL — AB (ref <0.50)
Buprenorphine: NEGATIVE ng/mL (ref <0.10)
Chlordiazepoxide: NEGATIVE ng/mL (ref <0.50)
Clonazepam: NEGATIVE ng/mL (ref <0.50)
Cocaine: NEGATIVE ng/mL (ref <5.0)
Codeine: NEGATIVE ng/mL (ref <2.5)
Cotinine: >250 ng/mL — ABNORMAL HIGH (ref <5.0)
Diazepam: NEGATIVE ng/mL (ref <0.50)
Dihydrocodeine: NEGATIVE ng/mL (ref <2.5)
Fentanyl: NEGATIVE ng/mL (ref <0.10)
Flunitrazepam: NEGATIVE ng/mL (ref <0.50)
Flurazepam: NEGATIVE ng/mL (ref <0.50)
Heroin Metabolite: NEGATIVE ng/mL (ref <1.0)
Hydrocodone: NEGATIVE ng/mL (ref <2.5)
Hydromorphone: NEGATIVE ng/mL (ref <2.5)
Lorazepam: NEGATIVE ng/mL (ref <0.50)
MARIJUANA: NEGATIVE ng/mL (ref <2.5)
MDMA: NEGATIVE ng/mL (ref <10)
Meprobamate: NEGATIVE ng/mL (ref <2.5)
Methadone: NEGATIVE ng/mL (ref <5.0)
Midazolam: NEGATIVE ng/mL (ref <0.50)
Morphine: NEGATIVE ng/mL (ref <2.5)
Nicotine Metabolite: POSITIVE ng/mL — AB (ref <5.0)
Nordiazepam: NEGATIVE ng/mL (ref <0.50)
Norhydrocodone: NEGATIVE ng/mL (ref <2.5)
Noroxycodone: 76.7 ng/mL — ABNORMAL HIGH (ref <2.5)
Opiates: POSITIVE ng/mL — AB (ref <2.5)
Oxazepam: NEGATIVE ng/mL (ref <0.50)
Oxycodone: >250 ng/mL — ABNORMAL HIGH (ref <2.5)
Oxymorphone: 3.4 ng/mL — ABNORMAL HIGH (ref <2.5)
Phencyclidine: NEGATIVE ng/mL (ref <10)
Tapentadol: NEGATIVE ng/mL (ref <5.0)
Temazepam: NEGATIVE ng/mL (ref <0.50)
Tramadol: NEGATIVE ng/mL (ref <5.0)
Triazolam: NEGATIVE ng/mL (ref <0.50)
Zolpidem: NEGATIVE ng/mL (ref <5.0)

## 2019-03-16 LAB — DRUG TOX ALC METAB W/CON, ORAL FLD: Alcohol Metabolite: NEGATIVE ng/mL (ref <25)

## 2019-03-18 NOTE — Telephone Encounter (Signed)
LMOM for a return call and will mail a letter also.

## 2019-03-30 DIAGNOSIS — J441 Chronic obstructive pulmonary disease with (acute) exacerbation: Secondary | ICD-10-CM | POA: Diagnosis not present

## 2019-04-20 ENCOUNTER — Telehealth: Payer: Self-pay

## 2019-04-20 NOTE — Telephone Encounter (Signed)
Patient called to discuss medications. Called patient back and left a message to call back

## 2019-04-21 NOTE — Telephone Encounter (Signed)
Patient left a voicemail on 04/21/19 at 3:14 about returning a phone call to our office.  She stated that she will need a refill on her medications, she wasn't out but she is going to need them--Oxycodone and Xanax.

## 2019-04-23 MED ORDER — ALPRAZOLAM 0.5 MG PO TABS: 0.5000 mg | ORAL_TABLET | Freq: Two times a day (BID) | ORAL | 0 refills | Status: DC | PRN

## 2019-04-23 MED ORDER — OXYCODONE HCL 5 MG PO CAPS: 5.0000 mg | ORAL_CAPSULE | Freq: Four times a day (QID) | ORAL | 0 refills | Status: DC | PRN

## 2019-04-23 NOTE — Telephone Encounter (Signed)
Refilled xanax and Oxycodone today

## 2019-04-23 NOTE — Telephone Encounter (Signed)
Patient has called again today asking for a refill on her oxycodone.  She said she left a message earlier this week.

## 2019-04-27 DIAGNOSIS — J441 Chronic obstructive pulmonary disease with (acute) exacerbation: Secondary | ICD-10-CM | POA: Diagnosis not present

## 2019-05-28 DIAGNOSIS — J441 Chronic obstructive pulmonary disease with (acute) exacerbation: Secondary | ICD-10-CM | POA: Diagnosis not present

## 2019-05-31 DIAGNOSIS — E039 Hypothyroidism, unspecified: Secondary | ICD-10-CM | POA: Diagnosis not present

## 2019-06-01 LAB — TSH: TSH: 3.22 u[IU]/mL (ref 0.450–4.500)

## 2019-06-11 ENCOUNTER — Encounter (HOSPITAL_COMMUNITY): Payer: Self-pay | Admitting: Emergency Medicine

## 2019-06-11 ENCOUNTER — Other Ambulatory Visit: Payer: Self-pay

## 2019-06-11 ENCOUNTER — Emergency Department (HOSPITAL_COMMUNITY): Payer: Medicare Other

## 2019-06-11 ENCOUNTER — Encounter (HOSPITAL_BASED_OUTPATIENT_CLINIC_OR_DEPARTMENT_OTHER): Payer: Medicare Other | Admitting: Physical Medicine and Rehabilitation

## 2019-06-11 ENCOUNTER — Emergency Department (HOSPITAL_COMMUNITY)
Admission: EM | Admit: 2019-06-11 | Discharge: 2019-06-11 | Disposition: A | Discharge status: Home / Self Care | Payer: Medicare Other | Attending: Emergency Medicine | Admitting: Emergency Medicine

## 2019-06-11 ENCOUNTER — Encounter: Payer: Self-pay | Admitting: Physical Medicine and Rehabilitation

## 2019-06-11 VITALS — BP 73/35 | HR 77 | Temp 98.1°F | Ht 62.0 in | Wt 128.4 lb

## 2019-06-11 DIAGNOSIS — I2510 Atherosclerotic heart disease of native coronary artery without angina pectoris: Secondary | ICD-10-CM | POA: Diagnosis not present

## 2019-06-11 DIAGNOSIS — F17210 Nicotine dependence, cigarettes, uncomplicated: Secondary | ICD-10-CM | POA: Insufficient documentation

## 2019-06-11 DIAGNOSIS — I739 Peripheral vascular disease, unspecified: Secondary | ICD-10-CM | POA: Diagnosis not present

## 2019-06-11 DIAGNOSIS — R0902 Hypoxemia: Secondary | ICD-10-CM | POA: Insufficient documentation

## 2019-06-11 DIAGNOSIS — E039 Hypothyroidism, unspecified: Secondary | ICD-10-CM | POA: Insufficient documentation

## 2019-06-11 DIAGNOSIS — I110 Hypertensive heart disease with heart failure: Secondary | ICD-10-CM | POA: Insufficient documentation

## 2019-06-11 DIAGNOSIS — I509 Heart failure, unspecified: Secondary | ICD-10-CM | POA: Insufficient documentation

## 2019-06-11 DIAGNOSIS — R531 Weakness: Secondary | ICD-10-CM | POA: Diagnosis not present

## 2019-06-11 DIAGNOSIS — J449 Chronic obstructive pulmonary disease, unspecified: Secondary | ICD-10-CM | POA: Diagnosis not present

## 2019-06-11 DIAGNOSIS — R0602 Shortness of breath: Secondary | ICD-10-CM | POA: Diagnosis not present

## 2019-06-11 DIAGNOSIS — J9601 Acute respiratory failure with hypoxia: Secondary | ICD-10-CM

## 2019-06-11 DIAGNOSIS — R9431 Abnormal electrocardiogram [ECG] [EKG]: Secondary | ICD-10-CM | POA: Diagnosis not present

## 2019-06-11 LAB — URINALYSIS, ROUTINE W REFLEX MICROSCOPIC
Bilirubin Urine: NEGATIVE
Glucose, UA: NEGATIVE mg/dL
Hgb urine dipstick: NEGATIVE
Ketones, ur: NEGATIVE mg/dL
Leukocytes,Ua: NEGATIVE
Nitrite: NEGATIVE
Protein, ur: 30 mg/dL — AB
Specific Gravity, Urine: 1.008 (ref 1.005–1.030)
pH: 6 (ref 5.0–8.0)

## 2019-06-11 LAB — CBC
HCT: 37.4 % (ref 36.0–46.0)
Hemoglobin: 10.4 g/dL — ABNORMAL LOW (ref 12.0–15.0)
MCH: 24.5 pg — ABNORMAL LOW (ref 26.0–34.0)
MCHC: 27.8 g/dL — ABNORMAL LOW (ref 30.0–36.0)
MCV: 88 fL (ref 80.0–100.0)
Platelets: 195 10*3/uL (ref 150–400)
RBC: 4.25 MIL/uL (ref 3.87–5.11)
RDW: 17.2 % — ABNORMAL HIGH (ref 11.5–15.5)
WBC: 8.5 10*3/uL (ref 4.0–10.5)
nRBC: 0 % (ref 0.0–0.2)

## 2019-06-11 LAB — POCT I-STAT EG7
Acid-Base Excess: 6 mmol/L — ABNORMAL HIGH (ref 0.0–2.0)
Bicarbonate: 32.7 mmol/L — ABNORMAL HIGH (ref 20.0–28.0)
Calcium, Ion: 1.15 mmol/L (ref 1.15–1.40)
HCT: 35 % — ABNORMAL LOW (ref 36.0–46.0)
Hemoglobin: 11.9 g/dL — ABNORMAL LOW (ref 12.0–15.0)
O2 Saturation: 91 %
Potassium: 2.8 mmol/L — ABNORMAL LOW (ref 3.5–5.1)
Sodium: 139 mmol/L (ref 135–145)
TCO2: 34 mmol/L — ABNORMAL HIGH (ref 22–32)
pCO2, Ven: 57.8 mmHg (ref 44.0–60.0)
pH, Ven: 7.36 (ref 7.250–7.430)
pO2, Ven: 64 mmHg — ABNORMAL HIGH (ref 32.0–45.0)

## 2019-06-11 LAB — COMPREHENSIVE METABOLIC PANEL
ALT: 9 U/L (ref 0–44)
AST: 13 U/L — ABNORMAL LOW (ref 15–41)
Albumin: 2.7 g/dL — ABNORMAL LOW (ref 3.5–5.0)
Alkaline Phosphatase: 90 U/L (ref 38–126)
Anion gap: 13 (ref 5–15)
BUN: 13 mg/dL (ref 8–23)
CO2: 29 mmol/L (ref 22–32)
Calcium: 8.7 mg/dL — ABNORMAL LOW (ref 8.9–10.3)
Chloride: 96 mmol/L — ABNORMAL LOW (ref 98–111)
Creatinine, Ser: 1.35 mg/dL — ABNORMAL HIGH (ref 0.44–1.00)
GFR calc Af Amer: 49 mL/min — ABNORMAL LOW (ref >60)
GFR calc non Af Amer: 42 mL/min — ABNORMAL LOW (ref >60)
Glucose, Bld: 117 mg/dL — ABNORMAL HIGH (ref 70–99)
Potassium: 2.9 mmol/L — ABNORMAL LOW (ref 3.5–5.1)
Sodium: 138 mmol/L (ref 135–145)
Total Bilirubin: 0.6 mg/dL (ref 0.3–1.2)
Total Protein: 5.8 g/dL — ABNORMAL LOW (ref 6.5–8.1)

## 2019-06-11 LAB — TROPONIN I (HIGH SENSITIVITY)
Troponin I (High Sensitivity): 35 ng/L — ABNORMAL HIGH (ref <18)
Troponin I (High Sensitivity): 40 ng/L — ABNORMAL HIGH (ref <18)

## 2019-06-11 LAB — BRAIN NATRIURETIC PEPTIDE: B Natriuretic Peptide: 492.4 pg/mL — ABNORMAL HIGH (ref 0.0–100.0)

## 2019-06-11 MED ORDER — ALPRAZOLAM 0.5 MG PO TABS: 0.5000 mg | ORAL_TABLET | Freq: Two times a day (BID) | ORAL | 0 refills | Status: DC | PRN

## 2019-06-11 MED ORDER — OXYCODONE HCL 5 MG PO CAPS: 5.0000 mg | ORAL_CAPSULE | Freq: Four times a day (QID) | ORAL | 0 refills | Status: DC | PRN

## 2019-06-11 MED ORDER — POTASSIUM CHLORIDE CRYS ER 20 MEQ PO TBCR
40.0000 meq | EXTENDED_RELEASE_TABLET | Freq: Once | ORAL | Status: AC
  Administered 2019-06-11: 18:00:00 40 meq via ORAL
  Filled 2019-06-11: qty 2

## 2019-06-11 NOTE — ED Provider Notes (Signed)
[facility] Emergency Department Provider Note MRN:  [id number]  Arrival date & time: 06/11/19     Chief Complaint   Weakness and Shortness of Breath   History of Present Illness   [name] is a 64 y.o. year-old female with a history of CHF, COPD presenting to the ED with chief complaint of weakness and shortness of breath.  1 month of intermittent shortness of breath, worse when she tries to go around the house and clean.  Explains that she was discharged from the hospital months ago after a CHF exacerbation and was discharged on oxygen.  She uses the oxygen infrequently at home.  Husband explains that she is just to busy and rushed and does not like to take breaks to use the oxygen.  She was found to be hypoxic at the PCP office today without her oxygen on and was sent here for evaluation.  She endorses intermittent chest pain over the past month as well, no pain currently.  Pain described as a pressure.  Denies cough, no fever, no abdominal pain, no numbness or weakness.  Review of Systems  A complete 10 system review of systems was obtained and all systems are negative except as noted in the HPI and PMH.   Patient's Health History    Past Medical History:  Diagnosis Date  . Anemia   . Anxiety and depression   . Bronchitis   . Cancer ([place])    uterus  . CHF (congestive heart failure) ([place])   . Chronic pain 11/22/2010  . COPD (chronic obstructive pulmonary disease) ([place])   . Coronary artery disease   . Gastroesophageal reflux disease 11/22/2010  . Hyperlipidemia   . Hypertension   . Hypothyroid   . Migraine headache 11/22/2010  . Near syncope   . On home oxygen therapy    started 08/2018  . Peripheral vascular disease ([place])   . Pneumonia   . Tobacco abuse    1/2 pack per day    Past Surgical History:  Procedure Laterality Date  . ABDOMINAL HYSTERECTOMY     without oophorectomy for neoplastic disease  . BACK SURGERY    . BIOPSY   10/22/2018   Procedure: BIOPSY;  Surgeon: Danie Binder, MD;  [facility];  Service: Endoscopy;;  duodenal, gastric  . CARDIAC CATHETERIZATION  07/2013   normal coronary arteries  . CARPAL TUNNEL RELEASE     Left  . CHOLECYSTECTOMY  2007  . DILATION AND CURETTAGE OF UTERUS    . ESOPHAGOGASTRODUODENOSCOPY (EGD) WITH PROPOFOL N/A 10/22/2018   gastritis (mild reactive gastropathy, negative duodenal biopsies for celiac).   Marland Kitchen LAPAROTOMY    . LEFT HEART CATHETERIZATION WITH CORONARY ANGIOGRAM N/A 07/12/2013   Procedure: LEFT HEART CATHETERIZATION WITH CORONARY ANGIOGRAM;  Surgeon: Leonie Man, MD;  Location: [facility] CATH LAB;  Service: Cardiovascular;  Laterality: N/A;  . LUMBAR LAMINECTOMY  x3   Left; complicated by neurologic dysfunction  . NASAL SINUS SURGERY      Family History  Problem Relation Age of Onset  . Colon polyps Mother   . Deep vein thrombosis Mother   . Heart disease Mother   . Hypertension Mother   . Bleeding Disorder Mother   . Hypertension Sister   . Heart disease Brother        before age 28  . Hypertension Brother     Social History   Socioeconomic History  . Marital status: Married    Spouse name: Not  on file  . Number of children: 2  . Years of education: Not on file  . Highest education level: Not on file  Occupational History    Comment: Disabled  Tobacco Use  . Smoking status: Current Every Day Smoker    Packs/day: 0.25    Years: 40.00    Pack years: 10.00    Types: Cigarettes    Start date: 09/06/1972  . Smokeless tobacco: Never Used  Substance and Sexual Activity  . Alcohol use: No    Alcohol/week: 0.0 standard drinks  . Drug use: No  . Sexual activity: Not Currently    Birth control/protection: Surgical  Other Topics Concern  . Not on file  Social History Narrative  . Not on file   Social Determinants of Health   Financial Resource Strain:   . Difficulty of Paying Living Expenses:   Food Insecurity:   . Worried About Paediatric nurse in the Last Year:   . Arboriculturist in the Last Year:   Transportation Needs:   . Film/video editor (Medical):   Marland Kitchen Lack of Transportation (Non-Medical):   Physical Activity:   . Days of Exercise per Week:   . Minutes of Exercise per Session:   Stress:   . Feeling of Stress :   Social Connections:   . Frequency of Communication with Friends and Family:   . Frequency of Social Gatherings with Friends and Family:   . Attends Religious Services:   . Active Member of Clubs or Organizations:   . Attends Archivist Meetings:   Marland Kitchen Marital Status:   Intimate Partner Violence:   . Fear of Current or Ex-Partner:   . Emotionally Abused:   Marland Kitchen Physically Abused:   . Sexually Abused:      Physical Exam   Vitals:   06/11/19 1539 06/11/19 1736  BP:  127/66  Pulse:  80  Resp:  (!) 21  Temp:    SpO2: 94% 95%    CONSTITUTIONAL: Chronically ill-appearing, NAD NEURO:  Alert and oriented x 3, no focal deficits EYES:  eyes equal and reactive ENT/NECK:  no LAD, no JVD CARDIO: Regular rate, well-perfused, normal S1 and S2 PULM:  CTAB no wheezing or rhonchi GI/GU:  normal bowel sounds, non-distended, non-tender MSK/SPINE:  No gross deformities, no edema SKIN:  no rash, atraumatic PSYCH:  Appropriate speech and behavior  *Additional and/or pertinent findings included in MDM below  Diagnostic and Interventional Summary    EKG Interpretation  Date/Time:  Friday Jun 11 2019 14:04:58 EDT Ventricular Rate:  81 PR Interval:  142 QRS Duration: 136 QT Interval:  424 QTC Calculation: 492 R Axis:   67 Text Interpretation: Sinus rhythm with marked sinus arrhythmia Left bundle branch block Abnormal ECG Confirmed by Gerlene Fee [phone]) on 06/11/2019 3:03:43 PM      Labs Reviewed  CBC - Abnormal; Notable for the following components:      Result Value   Hemoglobin 10.4 (*)    MCH 24.5 (*)    MCHC 27.8 (*)    RDW 17.2 (*)    All other components within normal  limits  COMPREHENSIVE METABOLIC PANEL - Abnormal; Notable for the following components:   Potassium 2.9 (*)    Chloride 96 (*)    Glucose, Bld 117 (*)    Creatinine, Ser 1.35 (*)    Calcium 8.7 (*)    Total Protein 5.8 (*)    Albumin 2.7 (*)    AST  13 (*)    GFR calc non Af Amer 42 (*)    GFR calc Af Amer 49 (*)    All other components within normal limits  BRAIN NATRIURETIC PEPTIDE - Abnormal; Notable for the following components:   B Natriuretic Peptide 492.4 (*)    All other components within normal limits  URINALYSIS, ROUTINE W REFLEX MICROSCOPIC - Abnormal; Notable for the following components:   APPearance HAZY (*)    Protein, ur 30 (*)    Bacteria, UA FEW (*)    All other components within normal limits  POCT I-STAT EG7 - Abnormal; Notable for the following components:   pO2, Ven 64.0 (*)    Bicarbonate 32.7 (*)    TCO2 34 (*)    Acid-Base Excess 6.0 (*)    Potassium 2.8 (*)    HCT 35.0 (*)    Hemoglobin 11.9 (*)    All other components within normal limits  TROPONIN I (HIGH SENSITIVITY) - Abnormal; Notable for the following components:   Troponin I (High Sensitivity) 35 (*)    All other components within normal limits  TROPONIN I (HIGH SENSITIVITY) - Abnormal; Notable for the following components:   Troponin I (High Sensitivity) 40 (*)    All other components within normal limits    DG Chest Port 1 View  Final Result      Medications  potassium chloride SA (KLOR-CON) CR tablet 40 mEq (40 mEq Oral Given 06/11/19 1800)     Procedures  /  Critical Care Procedures  ED Course and Medical Decision Making  I have reviewed the triage vital signs, the nursing notes, and pertinent available records from the EMR.  Listed above are laboratory and imaging tests that I personally ordered, reviewed, and interpreted and then considered in my medical decision making (see below for details).      Patient is having symptoms for over a month, question if they are due to her  underlying COPD or CHF.  I do not see any obvious evidence that she is in an acute exacerbation of these conditions.  She does not appear fluid overloaded, with her home oxygen she is saturating well and in no acute distress.  She would like to go home and she promises to use her oxygen more often.  If her work-up today is reassuring I think this would be an appropriate dispo.  Work-up is reassuring, patient continues to have saturations above 95% on her home level of oxygen, 3 L.  She wishes to be discharged and I see no reason to keep her here for further testing or admission.  Will follow up with primary care doctor.  Barth Kirks. Sedonia Small, MD [place] [email or web address]  Final Clinical Impressions(s) / ED Diagnoses     ICD-10-CM   1. Hypoxia  R09.02     ED Discharge Orders    None       Discharge Instructions Discussed with and Provided to Patient:     Discharge Instructions     You were evaluated in the Emergency Department and after careful evaluation, we did not find any emergent condition requiring admission or further testing in the hospital.  Your exam/testing today is overall reassuring.  Please use your home oxygen and follow-up with your regular doctor.  Please return to the Emergency Department if you experience any worsening of your condition.  We encourage you to follow up with a primary care provider.  Thank you for allowing [place]  to be a part of your care.       Maudie Flakes, MD 06/11/19 [phone]

## 2019-06-11 NOTE — Discharge Instructions (Addendum)
You were evaluated in the Emergency Department and after careful evaluation, we did not find any emergent condition requiring admission or further testing in the hospital.  Your exam/testing today is overall reassuring.  Please use your home oxygen and follow-up with your regular doctor.  Please return to the Emergency Department if you experience any worsening of your condition.  We encourage you to follow up with a primary care provider.  Thank you for allowing [place] to be a part of your care.

## 2019-06-11 NOTE — Progress Notes (Signed)
Subjective:    Patient ID: [name], female    DOB: 1955-07-22, 64 y.o.   MRN: [id number]  HPI   [place] yesterday and lightheaded and fell when couldn't get to chair.  Dizzy lately, a lot.   Intake has been exceeded by output- drinking a mountain dew most of the time per husband.     Pain Inventory Average Pain 7 Pain Right Now 7 My pain is sharp, burning and stabbing  In the last 24 hours, has pain interfered with the following? General activity 7 Relation with others 7 Enjoyment of life 7 What TIME of day is your pain at its worst? morning Sleep (in general) Fair  Pain is worse with: bending, sitting and standing Pain improves with: pacing activities and medication Relief from Meds: 8  Mobility walk without assistance how many minutes can you walk? 30 do you drive?  yes  Function Do you have any goals in this area?  no  Neuro/Psych weakness numbness tingling spasms dizziness depression anxiety  Prior Studies Any changes since last visit?  no  Physicians involved in your care Any changes since last visit?  no   Family History  Problem Relation Age of Onset  . Colon polyps Mother   . Deep vein thrombosis Mother   . Heart disease Mother   . Hypertension Mother   . Bleeding Disorder Mother   . Hypertension Sister   . Heart disease Brother        before age 62  . Hypertension Brother    Social History   Socioeconomic History  . Marital status: Married    Spouse name: Not on file  . Number of children: 2  . Years of education: Not on file  . Highest education level: Not on file  Occupational History    Comment: Disabled  Tobacco Use  . Smoking status: Current Every Day Smoker    Packs/day: 0.25    Years: 40.00    Pack years: 10.00    Types: Cigarettes    Start date: 09/06/1972  . Smokeless tobacco: Never Used  Substance and Sexual Activity  . Alcohol use: No    Alcohol/week: 0.0 standard drinks  . Drug use: No  . Sexual  activity: Not Currently    Birth control/protection: Surgical  Other Topics Concern  . Not on file  Social History Narrative  . Not on file   Social Determinants of Health   Financial Resource Strain:   . Difficulty of Paying Living Expenses:   Food Insecurity:   . Worried About Charity fundraiser in the Last Year:   . Arboriculturist in the Last Year:   Transportation Needs:   . Film/video editor (Medical):   Marland Kitchen Lack of Transportation (Non-Medical):   Physical Activity:   . Days of Exercise per Week:   . Minutes of Exercise per Session:   Stress:   . Feeling of Stress :   Social Connections:   . Frequency of Communication with Friends and Family:   . Frequency of Social Gatherings with Friends and Family:   . Attends Religious Services:   . Active Member of Clubs or Organizations:   . Attends Archivist Meetings:   Marland Kitchen Marital Status:    Past Surgical History:  Procedure Laterality Date  . ABDOMINAL HYSTERECTOMY     without oophorectomy for neoplastic disease  . BACK SURGERY    . BIOPSY  10/22/2018   Procedure: BIOPSY;  Surgeon:  Fields, Marga Melnick, MD;  [facility];  Service: Endoscopy;;  duodenal, gastric  . CARDIAC CATHETERIZATION  07/2013   normal coronary arteries  . CARPAL TUNNEL RELEASE     Left  . CHOLECYSTECTOMY  2007  . DILATION AND CURETTAGE OF UTERUS    . ESOPHAGOGASTRODUODENOSCOPY (EGD) WITH PROPOFOL N/A 10/22/2018   gastritis (mild reactive gastropathy, negative duodenal biopsies for celiac).   Marland Kitchen LAPAROTOMY    . LEFT HEART CATHETERIZATION WITH CORONARY ANGIOGRAM N/A 07/12/2013   Procedure: LEFT HEART CATHETERIZATION WITH CORONARY ANGIOGRAM;  Surgeon: Leonie Man, MD;  Location: [facility] CATH LAB;  Service: Cardiovascular;  Laterality: N/A;  . LUMBAR LAMINECTOMY  x3   Left; complicated by neurologic dysfunction  . NASAL SINUS SURGERY     Past Medical History:  Diagnosis Date  . Anemia   . Anxiety and depression   . Bronchitis   .  Cancer ([place])    uterus  . CHF (congestive heart failure) ([place])   . Chronic pain 11/22/2010  . COPD (chronic obstructive pulmonary disease) ([place])   . Coronary artery disease   . Gastroesophageal reflux disease 11/22/2010  . Hyperlipidemia   . Hypertension   . Hypothyroid   . Migraine headache 11/22/2010  . Near syncope   . On home oxygen therapy    started 08/2018  . Peripheral vascular disease ([place])   . Pneumonia   . Tobacco abuse    1/2 pack per day   BP (!) 73/35   Pulse 77   Temp 98.1 F (36.7 C)   Ht 5\' 2"  (1.575 m)   Wt 128 lb 6.4 oz (58.2 kg)   SpO2 (!) 74%   BMI 23.48 kg/m   Opioid Risk Score:   Fall Risk Score:  `1  Depression screen PHQ 2/9  Depression screen PHQ 2/9 09/17/2018  Decreased Interest 0  Down, Depressed, Hopeless 0  PHQ - 2 Score 0  Altered sleeping 0  Tired, decreased energy 0  Change in appetite 1  Feeling bad or failure about yourself  0  Trouble concentrating 0  Moving slowly or fidgety/restless 0  Suicidal thoughts 0  PHQ-9 Score 1  Difficult doing work/chores Not difficult at all    Review of Systems  Constitutional: Positive for diaphoresis.  Respiratory: Positive for apnea and shortness of breath.   Neurological: Positive for dizziness, weakness and numbness.  Psychiatric/Behavioral: Positive for dysphoric mood. The patient is nervous/anxious.   All other systems reviewed and are negative.      Objective:   Physical Exam   BP 73/35 Pulse 77 Sats 70-73% No air movement- poor air movement on listening Purple lips      Assessment & Plan:   1. Sending to ER- will call them to let them know what's going on.   2. Will work on nerve pain she's describing in next week or so.   3. F/U in 1 week or so.

## 2019-06-11 NOTE — ED Notes (Signed)
Patient placed on 4L O2 [place]

## 2019-06-11 NOTE — ED Triage Notes (Signed)
Patient arrives to ED with complaints of weakness and shortness of breath for the past month. Patient states that she went to her PCP office and her O2 saturation was 75% on room air, in triage O2 saturation is 70%. Patient is supposed to be on oxygen at home but does not wear it a lot.

## 2019-06-27 DIAGNOSIS — J441 Chronic obstructive pulmonary disease with (acute) exacerbation: Secondary | ICD-10-CM | POA: Diagnosis not present

## 2019-07-09 ENCOUNTER — Encounter: Payer: Medicare Other | Admitting: Physical Medicine and Rehabilitation

## 2019-07-12 ENCOUNTER — Encounter
Payer: Medicare Other | Attending: Physical Medicine and Rehabilitation | Admitting: Physical Medicine and Rehabilitation

## 2019-07-12 ENCOUNTER — Encounter: Payer: Self-pay | Admitting: Physical Medicine and Rehabilitation

## 2019-07-12 ENCOUNTER — Other Ambulatory Visit: Payer: Self-pay

## 2019-07-12 VITALS — BP 98/66 | HR 85 | Temp 98.2°F | Ht 61.0 in | Wt 127.0 lb

## 2019-07-12 DIAGNOSIS — J9611 Chronic respiratory failure with hypoxia: Secondary | ICD-10-CM | POA: Diagnosis not present

## 2019-07-12 DIAGNOSIS — M5416 Radiculopathy, lumbar region: Secondary | ICD-10-CM

## 2019-07-12 DIAGNOSIS — J9612 Chronic respiratory failure with hypercapnia: Secondary | ICD-10-CM | POA: Diagnosis not present

## 2019-07-12 DIAGNOSIS — G894 Chronic pain syndrome: Secondary | ICD-10-CM | POA: Diagnosis not present

## 2019-07-12 MED ORDER — ALPRAZOLAM 0.5 MG PO TABS: 0.5000 mg | ORAL_TABLET | Freq: Two times a day (BID) | ORAL | 0 refills | Status: DC | PRN

## 2019-07-12 MED ORDER — OXYCODONE HCL 5 MG PO CAPS: 5.0000 mg | ORAL_CAPSULE | Freq: Four times a day (QID) | ORAL | 0 refills | Status: DC | PRN

## 2019-07-12 NOTE — Patient Instructions (Signed)
Pt is a 64 yr old female with Sciatica and chronic low back pain/lumbar radiculopathy s/p 3 lumbar surgeries in 1990s.Also has severe anxiety- was on Xanax 0.5 mg prior- andwason O2 full time and insomniaand hypothyroidism with increased Synthroid.   1. Refill of Xanax -5 mg BID #60  2. Oxycodone 5 mg QID/4x/day as needed #120- no RFs  3. Xanax and Oxycodone- Rx's due to be refilled.   4. Only gave her K+ at the last visit when went to ER.  5. Needs to go back to Pulmonary- and maximize meds.   6. F/U in 3 months  7. Insurance won't pay for Chantix- and nicoderm patches -she's allergic to adhesive.

## 2019-07-12 NOTE — Progress Notes (Signed)
Subjective:    Patient ID: [name], female    DOB: 07/07/1955, 64 y.o.   MRN: [id number]  HPI   1. Pt is a 64 yr old female with Sciatica and chronic low back pain/lumbar radiculopathy s/p 3 lumbar surgeries in 1990s.Also has severe anxiety- was on Xanax 0.5 mg prior- andwason O2 full time and insomniaand hypothyroidism with increased Synthroid.   Wearing O2 today- Not doing as much as it used to- has taken up a few notches- sets it on 6L.  Having lightheadedness-  Very depressed- A lot of panic attacks.  O2 currently set at 2L- and O2 sats 88% Not on any pulmonary meds- only Flonase and allergy meds.    Grandson graduating- wants to go to graduation.  Graduation in collisium  Having panic attacks and crying  A lot.   Pain was stable- dog got his leash wrapped around ankle- pulled her down a flight of stairs. 2 weeks ago and then did it ago Then accidentally pushed her down steps- weighs 50-60 lbs.  Fell backwards down steps 1 week prior to ankle issues.   Took more meds than had because of that, but even though due for meds, still has some pills left.   Fully vaccinated against COVID- and husband is too.   Started taking a menopause medicine- has helped burning pain that's "deep inside".      Social Hx: Had a deck built on.  Cannot get out in heat, even to grocery store.  Frustrated and anxious because cannot get out.       Pain Inventory Average Pain 6 Pain Right Now 7 My pain is sharp, burning, dull, stabbing and aching  In the last 24 hours, has pain interfered with the following? General activity 6 Relation with others 5 Enjoyment of life 5 What TIME of day is your pain at its worst? morning Sleep (in general) Poor  Pain is worse with: bending, sitting, inactivity, standing and some activites Pain improves with: medication Relief from Meds: 7  Mobility walk without assistance ability to climb steps?  yes do you drive?  yes use a  wheelchair transfers alone  Function disabled: date disabled .  Neuro/Psych weakness numbness tingling spasms dizziness anxiety  Prior Studies Any changes since last visit?  no  Physicians involved in your care Any changes since last visit?  no   Family History  Problem Relation Age of Onset  . Colon polyps Mother   . Deep vein thrombosis Mother   . Heart disease Mother   . Hypertension Mother   . Bleeding Disorder Mother   . Hypertension Sister   . Heart disease Brother        before age 1  . Hypertension Brother    Social History   Socioeconomic History  . Marital status: Married    Spouse name: Not on file  . Number of children: 2  . Years of education: Not on file  . Highest education level: Not on file  Occupational History    Comment: Disabled  Tobacco Use  . Smoking status: Current Every Day Smoker    Packs/day: 0.25    Years: 40.00    Pack years: 10.00    Types: Cigarettes    Start date: 09/06/1972  . Smokeless tobacco: Never Used  Substance and Sexual Activity  . Alcohol use: No    Alcohol/week: 0.0 standard drinks  . Drug use: No  . Sexual activity: Not Currently    Birth control/protection:  Surgical  Other Topics Concern  . Not on file  Social History Narrative  . Not on file   Social Determinants of Health   Financial Resource Strain:   . Difficulty of Paying Living Expenses:   Food Insecurity:   . Worried About Charity fundraiser in the Last Year:   . Arboriculturist in the Last Year:   Transportation Needs:   . Film/video editor (Medical):   Marland Kitchen Lack of Transportation (Non-Medical):   Physical Activity:   . Days of Exercise per Week:   . Minutes of Exercise per Session:   Stress:   . Feeling of Stress :   Social Connections:   . Frequency of Communication with Friends and Family:   . Frequency of Social Gatherings with Friends and Family:   . Attends Religious Services:   . Active Member of Clubs or Organizations:   .  Attends Archivist Meetings:   Marland Kitchen Marital Status:    Past Surgical History:  Procedure Laterality Date  . ABDOMINAL HYSTERECTOMY     without oophorectomy for neoplastic disease  . BACK SURGERY    . BIOPSY  10/22/2018   Procedure: BIOPSY;  Surgeon: Danie Binder, MD;  [facility];  Service: Endoscopy;;  duodenal, gastric  . CARDIAC CATHETERIZATION  07/2013   normal coronary arteries  . CARPAL TUNNEL RELEASE     Left  . CHOLECYSTECTOMY  2007  . DILATION AND CURETTAGE OF UTERUS    . ESOPHAGOGASTRODUODENOSCOPY (EGD) WITH PROPOFOL N/A 10/22/2018   gastritis (mild reactive gastropathy, negative duodenal biopsies for celiac).   Marland Kitchen LAPAROTOMY    . LEFT HEART CATHETERIZATION WITH CORONARY ANGIOGRAM N/A 07/12/2013   Procedure: LEFT HEART CATHETERIZATION WITH CORONARY ANGIOGRAM;  Surgeon: Leonie Man, MD;  Location: [facility] CATH LAB;  Service: Cardiovascular;  Laterality: N/A;  . LUMBAR LAMINECTOMY  x3   Left; complicated by neurologic dysfunction  . NASAL SINUS SURGERY     Past Medical History:  Diagnosis Date  . Anemia   . Anxiety and depression   . Bronchitis   . Cancer ([place])    uterus  . CHF (congestive heart failure) ([place])   . Chronic pain 11/22/2010  . COPD (chronic obstructive pulmonary disease) ([place])   . Coronary artery disease   . Gastroesophageal reflux disease 11/22/2010  . Hyperlipidemia   . Hypertension   . Hypothyroid   . Migraine headache 11/22/2010  . Near syncope   . On home oxygen therapy    started 08/2018  . Peripheral vascular disease ([place])   . Pneumonia   . Tobacco abuse    1/2 pack per day   There were no vitals taken for this visit.  Opioid Risk Score:   Fall Risk Score:  `1  Depression screen PHQ 2/9  Depression screen PHQ 2/9 09/17/2018  Decreased Interest 0  Down, Depressed, Hopeless 0  PHQ - 2 Score 0  Altered sleeping 0  Tired, decreased energy 0  Change in appetite 1  Feeling bad or failure about yourself  0  Trouble  concentrating 0  Moving slowly or fidgety/restless 0  Suicidal thoughts 0  PHQ-9 Score 1  Difficult doing work/chores Not difficult at all    Review of Systems  Neurological: Positive for weakness and numbness.       Tingling and spasms  All other systems reviewed and are negative.      Objective:   Physical Exam  Awake, tearful, on O2-  sats 88% when on 2L, NAD Sitting on table L eye also watering even when not tearful Wearing O2 Slightly TTP over R low back- buttock- "deep inside".         Assessment & Plan:   1. Pt is a 64 yr old female with Sciatica and chronic low back pain/lumbar radiculopathy s/p 3 lumbar surgeries in 1990s.Also has severe anxiety- was on Xanax 0.5 mg prior- andwason O2 full time and insomniaand hypothyroidism with increased Synthroid.   1. Refill of Xanax -5 mg BID #60  2. Oxycodone 5 mg QID/4x/day as needed #120- no RFs  3. Xanax and Oxycodone- Rx's due to be refilled.   4. Only gave her K+ at the last visit when went to ER.  5. Needs to go back to Pulmonary- and maximize meds.   6. F/U in 3 months   7. Husband still smoking 1ppd- and smokes in front of her, and she's set back off, and will restart smoking.   8. Insurance won't pay for Chantix- and nicoderm patches -she's allergic to adhesive.   I spent a total of 30 minutes on appointment- as detailed above.

## 2019-07-13 ENCOUNTER — Encounter: Payer: Self-pay | Admitting: Pulmonary Disease

## 2019-07-13 ENCOUNTER — Ambulatory Visit (INDEPENDENT_AMBULATORY_CARE_PROVIDER_SITE_OTHER): Payer: Medicare Other | Admitting: Pulmonary Disease

## 2019-07-13 DIAGNOSIS — J449 Chronic obstructive pulmonary disease, unspecified: Secondary | ICD-10-CM

## 2019-07-13 NOTE — Patient Instructions (Signed)
Or scheduled for a televisit with our office today.  We were unable to reach you.  Please contact our office and reschedule an appointment with Dr. Melvyn Novas or a APP.  Wyn Quaker, FNP

## 2019-07-13 NOTE — Progress Notes (Signed)
Virtual Visit via Telephone Note    I was unable to reach the patient.  Location: Patient: Home Provider: [facility] - [address], [place], [place], [place] [address]   I discussed the limitations, risks, security and privacy concerns of performing an evaluation and management service by telephone and the availability of in person appointments. I also discussed with the patient that there may be a patient responsible charge related to this service. The patient expressed understanding and agreed to proceed.  Patient consented to consult via telephone: Yes People present and their role in pt care: Pt     History of Present Illness:  64 year old female current everyday smoker followed in our office for COPD, chronic respiratory failure  Past medical history: Hyperlipidemia, hypertension, hypothyroidism, anxiety, depression, chronic pain, anemia Smoking history: Current smoker Maintenance: Patient of Dr. Melvyn Novas   Chief complaint:    64 year old female current everyday smoker completing a televisit today with our office.  Patient was recently seen in the emergency room on 06/11/2019.  There was concerns that she may have a exacerbation of COPD or CHF.  After evaluation they did not see any obvious evidence of acute exacerbation of the symptoms.  She was discharged after 5 hours.  It is recommended that she follow-up with PCP as well as continue to monitor home saturations and ensure they remain above 95% on 3L.  Patient was last seen by Dr. Melvyn Novas in January/2021 at that time complete patient completed a telephone visit was recommended that she follow-up in 6 months.  07/13/2019-1128-attempt, voicemail left 07/13/19-84-2021-1138-call attempt, no answer   Observations/Objective:  06/11/2019-BNP-492 06/11/2019-troponin 35 06/11/2019-chest x-ray-minimal bibasilar subsegmental atelectasis, aortic arthrosclerosis  07/26/2015-spirometry-FVC 2.20 (71% predicted), ratio 58, FEV1 1.28  (53% predicted)  Social History   Tobacco Use  Smoking Status Current Every Day Smoker  . Packs/day: 0.25  . Years: 40.00  . Pack years: 10.00  . Types: Cigarettes  . Start date: 09/06/1972  Smokeless Tobacco Never Used   Immunization History  Administered Date(s) Administered  . Fluad Quad(high Dose 65+) 11/19/2018  . Influenza Inj Mdck Quad With Preservative 11/10/2017  . Pneumococcal Polysaccharide-23 02/22/2012      Assessment and Plan:  No problem-specific Assessment & Plan notes found for this encounter.   Follow Up Instructions:  No follow-ups on file.   I discussed the assessment and treatment plan with the patient. The patient was provided an opportunity to ask questions and all were answered. The patient agreed with the plan and demonstrated an understanding of the instructions.   The patient was advised to call back or seek an in-person evaluation if the symptoms worsen or if the condition fails to improve as anticipated.  I provided 0 minutes of non-face-to-face time during this encounter.   Lauraine Rinne, NP

## 2019-07-19 ENCOUNTER — Telehealth: Payer: Self-pay | Admitting: Internal Medicine

## 2019-07-19 ENCOUNTER — Telehealth: Payer: Self-pay

## 2019-07-19 MED ORDER — MIRTAZAPINE 15 MG PO TABS
15.0000 mg | ORAL_TABLET | Freq: Every day | ORAL | 5 refills | Status: DC
Start: 2019-07-19 — End: 2020-02-14

## 2019-07-19 NOTE — Telephone Encounter (Signed)
Spoke with patient. She stated that she has been more depressed lately because she depends on her O2. She has a daughter and son that both live 71 minutes-1 hr away but she can not visit them due to having to take the big O2 tanks with her. She is interested in getting an inogen. When I asked her how much O2 was she currently on, she stated that she is on 4L at rest and night and 6L with exertion.   I advised her that with her having to use 6L with exertion, the Inogen device may not be a good option for her as the battery would not last long with the oxygen being on 6L. She verbalized understanding but stated she still wanted to know what MW would advise.   I was able to get her scheduled with MW in [place] on 6/16 as she was not aware we had an office in [place]. She is aware of the location and the fact she will get to the MW.   She will discuss the O2 with MW on Wednesday. Advised her to call us back if she needed anything.

## 2019-07-19 NOTE — Telephone Encounter (Signed)
Patient called for refill on Remeron. RX sent in.

## 2019-07-21 ENCOUNTER — Ambulatory Visit (INDEPENDENT_AMBULATORY_CARE_PROVIDER_SITE_OTHER): Payer: Medicare Other | Admitting: Internal Medicine

## 2019-07-21 ENCOUNTER — Encounter: Payer: Self-pay | Admitting: Internal Medicine

## 2019-07-21 ENCOUNTER — Other Ambulatory Visit: Payer: Self-pay

## 2019-07-21 DIAGNOSIS — J449 Chronic obstructive pulmonary disease, unspecified: Secondary | ICD-10-CM

## 2019-07-21 DIAGNOSIS — J9612 Chronic respiratory failure with hypercapnia: Secondary | ICD-10-CM | POA: Diagnosis not present

## 2019-07-21 DIAGNOSIS — F17210 Nicotine dependence, cigarettes, uncomplicated: Secondary | ICD-10-CM | POA: Diagnosis not present

## 2019-07-21 DIAGNOSIS — J9611 Chronic respiratory failure with hypoxia: Secondary | ICD-10-CM | POA: Diagnosis not present

## 2019-07-21 MED ORDER — TRELEGY ELLIPTA 100-62.5-25 MCG/INH IN AEPB
INHALATION_SPRAY | RESPIRATORY_TRACT | 11 refills | Status: DC
Start: 2019-07-21 — End: 2020-04-07

## 2019-07-21 NOTE — Assessment & Plan Note (Signed)
D/c from [facility] 08/27/18 with HC03 30 on 3lpm  12/08/2018  Patient Saturations on Room Air at Rest = 94%, while Ambulating = 87% Patient Saturations on 3  Liters of pulsed oxygen while Ambulating = 95%  02 sats ok at rest on 3lpm but just barely so not qualified for POC since needs continuous flow  Rec: Refer to adapt for best fit for amb 02 Make sure you check your oxygen saturations at highest level of activity to be sure it stays over 90% and adjust upward to maintain this level if needed but remember to turn it back to previous settings when you stop (to conserve your supply).

## 2019-07-21 NOTE — Progress Notes (Signed)
[name], female    DOB: 1955-08-20      MRN: [id number]   Brief patient profile:  80 yowf MM/active smoker with onset somewhat limited by both breathing and back/ legs x 2017  With GOLD II criteria by spirometry  07/17/15   rx with proair avg sev times a week at most no maint rx then acutely ill for a few days weakness > sob  before admitted [facility]  With new acute resp failure/ severe hypoxemia.      Admission date:  08/24/2018  Admitting Physician  Bethena Roys, MD  Discharge Date:  08/27/2018   Primary MD  Jani Gravel, MD  Recommendations for primary care physician for things to follow:   1)Very low-salt diet advised 2)Weigh yourself daily, call if you gain more than 3 pounds in 1 day or more than 5 pounds in 1 week as your diuretic medications may need to be adjusted 3)Limit your Fluid  intake to no more than 60 ounces (1.8 Liters) per day 4)Follow -up with gastroenterologist Dr. Barney Drain at Address: [address] [place], [place] [address] Phone: [phone]---- most likely need upper endoscopy and colonoscopy 5)Avoid ibuprofen/Advil/Aleve/Motrin/Goody Powders/Naproxen/BC powders/Meloxicam/Diclofenac/Indomethacin and other Nonsteroidal anti-inflammatory medications as these will make you more likely to bleed and can cause stomach ulcers, can also cause Kidney problems.  6) you need repeat CBC and BMP blood test around Tuesday, 09/01/2018 7) absolutely no smoking around oxygen--this related to fire, injury and Death   Admission Diagnosis  Hypokalemia [E87.6] Pleural effusion [J90] Hypoxia [R09.02] AKI (acute kidney injury) ([place]) [N17.9] Gastrointestinal hemorrhage, unspecified gastrointestinal hemorrhage type [K92.2] Anemia, unspecified type [D64.9] Depression, unspecified depression type [F32.9] Community acquired pneumonia of right lung, unspecified part of lung [J18.9]   Discharge Diagnosis  Hypokalemia [E87.6] Pleural effusion [J90] Hypoxia  [R09.02] AKI (acute kidney injury) ([place]) [N17.9] Gastrointestinal hemorrhage, unspecified gastrointestinal hemorrhage type [K92.2] Anemia, unspecified type [D64.9] Depression, unspecified depression type [F32.9] Community acquired pneumonia of right lung, unspecified part of lung [J18.9]    Principal Problem:   Acute respiratory failure with hypoxia ([place]) Active Problems:   Anxiety and depression   HTN (hypertension)   COPD exacerbation ([facility])   Hypokalemia   Elevated LFTs   Anemia   Gastrointestinal hemorrhage          Past Medical History:  Diagnosis Date  . Anemia   . Anxiety and depression   . Bronchitis   . Cancer ([place])    uterus  . CHF (congestive heart failure) ([place])   . Chronic pain 11/22/2010  . COPD (chronic obstructive pulmonary disease) ([place])   . Gastroesophageal reflux disease 11/22/2010  . Hyperlipidemia   . Hypertension   . Hypothyroid   . Migraine headache 11/22/2010  . Near syncope   . Peripheral vascular disease ([place])   . Pneumonia   . Tobacco abuse    1/2 pack per day         Past Surgical History:  Procedure Laterality Date  . ABDOMINAL HYSTERECTOMY     without oophorectomy for neoplastic disease  . BACK SURGERY    . CARDIAC CATHETERIZATION  07/2013   normal coronary arteries  . CARPAL TUNNEL RELEASE     Left  . CHOLECYSTECTOMY  2007  . DILATION AND CURETTAGE OF UTERUS    . LAPAROTOMY    . LEFT HEART CATHETERIZATION WITH CORONARY ANGIOGRAM N/A 07/12/2013   Procedure: LEFT HEART CATHETERIZATION WITH CORONARY ANGIOGRAM;  Surgeon: Leonie Man, MD;  Location: [facility]  CATH LAB;  Service: Cardiovascular;  Laterality: N/A;  . LUMBAR LAMINECTOMY  x3   Left; complicated by neurologic dysfunction  . NASAL SINUS SURGERY       HPI  from the history and physical done on the day of admission:     Patient coming from:Home  I have personally briefly reviewed patient's old medical records in [facility]  Link  Chief Complaint:Weakness  [name] a 64 y.o.femalewith medical history significant forCHF, hypothyroidism, peripheral vascular disease, COPD, depression anxiety, hypertension, subclavian steal syndrome, CVA. At the time of my evaluation patient is alert and oriented,able to give me a history. Who presented to the ED with complaints of generalized weakness of 2 weeks,and confusion at night. Patient reports over the past 2 weeks she has barely eatenor drank anything that she has felt so depressed. No abdominal pain, no vomiting, she reports some occasional loose stools. She reports chronic difficulty breathing,but is unable to tell me if this has changed recently. She denies cough. No chest pain. No fever or chills. She reports bilateral lower extremity swelling of about 2 weeks duration now. She reports her normal weight is about 115pounds. She denies being on any fluid pills. She denies vomiting of blood, denies black stools or bloody stools. She denies NSAID use. She is unaware of a history of heart disease or stroke,though both of these are listed on her medical problems. Patient is supposed to be on CPAP at night and home oxygen,both of which she has not used in at least 4 years. Per patient spouse-he has been seeing blood in patient's underwear,and patient has been admitted to [facility] for same.  ED Course:Temperature 98. Respiratory rate 17-24, blood pressure systolic 1 1 7-1 2 9, O2 sats 68% on room air. Patient was initially lethargic and somewhat confused, this improved after patient was placed on 2 L nasal cannula she became more alert and oriented. After placing patient on O2 by nasal cannula, patient's been BNP elevated at 1260.Mild leukocytosis 12.4. Low potassium 2.6. Elevated creatinine 2.47. Elevated bicarb 33. Hemoglobin 7.9. EKG without significant change from prior. Stool occult positive. Portable chest x-ray shows  interval cardiomegaly and moderate-sized right pleural effusion with right basilar atelectasis or pneumonia. Patient started on IV ceftriaxone and azithromycin. Hospitalist to admit for community-acquired pneumonia.     Hospital Course:       A/p Acute respiratory failure with hypoxia-with reported dyspnea,---most likely due to pneumonia and CHF, patient continues to require supplemental oxygen, currently requiring 3 L via nasal cannula   HFpEF/dCHF history-decompensated. New 1+ pitting pedal edema bilaterally,marked elevated BNP 1260 ( was 67 fouryears ago)chest x-ray with moderate sized right pleural effusion, no echo on file.  PTA was Not on diuretics. She is unaware if she has gained weight. Denies chest pain. EKG unchanged.  -Diuresed well with Lasix,.  Okay to discharge home on p.o. Lasix -Echocardiogram with LVEF 50-55%  Rt Sided Pleural Effusion--- status post ultrasound-guided thoracentesis with removal of 600 mL of serous fluid, fluid studies pending, postthoracentesis chest x-ray shows significant improvement in right-sided pleural effusion and right lung aeration  Acute kidney injury-creatininewas2.47,improving, creatinine is down to 1.51,Last available creatinine 4 years ago baseline 0.6 - 0.7.-----  Generalized weakness-likely multifactorial-overall improved,  Metabolic encephalopathy-likely from hypoxia/hypercapnia due to CPAP non-compliance,and probably pneumonia.  -Mental statuschanges have resolved, patient is back to baseline  Acuteiron deficiencyanemia-hemoglobinis up to 9.6 post transfusion of 2 units of packed cells,Last check 4 years ago baseline 11-12. Stool occult  positive.She denies black stools or blood loss, orabdominal pain.Newton Pigg use. NoColonoscopy or EGD on file. Per patient spouse-he has been seeing blood in patient's underwear,and patient has been admitted to [facility] for  same. S/pFeraheme transfusion  - CBC a.m -GI following with possible EGD soon and records being obtained from [facility] --GI consult appreciated and recommend outpatient EGD with Dr. Katharine Look fields  Tobacco abuse/COPD exacerbation/OSA--Hypoxia persist, smoking cessation advised, continue bronchodilators and steroids, treated with Rocephin---Continue CPAP and supplemental oxygen  Anxiety/depression- reports depressed feelings over the past 2 weeks,which resulted in poor p.o. intake. Denies suicidal or homicidal ideations. -TTSpsychiatry consultation appreciated, they recommend outpatient follow-up  Disposition--- patient repeatedly came down to the nursing station, demanding to be discharged home or she would leave AMA. --It took a lot of pleading for patient to agree to right-sided thoracentesis prior to discharge --She wanted to be discharged home As possible refused to stay for further treatment or interventions --She repeatedly threatening to leave AMA so she was reluctantly discharged home with home O2 and home health services          History of Present Illness  12/08/2018  Pulmonary/ 1st office eval/Jasier Calabretta  GOLD II/ still smoking on symb 73 'now and then"  And 02 24/7  Chief Complaint  Patient presents with  . Pulmonary Consult    Referred by Dr Deniece Ree. Pt c/o SOB since July 2020. She states she was hospitalized with CHF then and started on o2 3lpm continuous o2.   Dyspnea:  Better  But can't walk the dog yet, better on 02 3lpm cont Cough: none  Sleep: no resp symptoms  flat  SABA use: less than usual / nebulizer twice weekly at most  0 2 3lpm 24/7 does not titrate though has pulse ox rec Plan A = Automatic = Always=   Symbicort 160 Take 2 puffs first thing in am and then another 2 puffs about 12 hours later.  Work on inhaler technique:    Plan B = Backup (to supplement plan A, not to replace it) Only use your albuterol inhaler as a rescue medication to be  used if you can't catch your breath   Plan C = Crisis (instead of Plan B but only if Plan B stops working) - only use your albuterol nebulizer if you first try Plan B and it fails to help > ok to use the nebulizer up to every 4 hours but if start needing it regularly call for immediate appointment Goal is to keep your 02 saturation above 90%  The key is to stop smoking completely before smoking completely stops you!   07/21/2019  f/u ov/Hendryx Ricke re: GOLD II/ still smoking but cutting down/ unable to use hfa correctly  / had vaccines for COVID 19  Chief Complaint  Patient presents with  . Follow-up    Breathing has been "off and on decent" c/o feeling light headed.    Dyspnea:  Walking dog around the outside of the house Cough: "it's from my drainage from allergies better with nasal spray/ mucoid  Sleeping: flat bed 2 pillows  SABA use:  Used saba am of ov neb but not the symbicort yet  02: 3lpm continuous   No obvious day to day or daytime variability or assoc purulent sputum or mucus plugs or hemoptysis or cp or chest tightness, subjective wheeze or overt  hb symptoms.   Sleeping  without nocturnal  or early am exacerbation  of respiratory  c/o's or need for noct saba.  Also denies any obvious fluctuation of symptoms with weather or environmental changes or other aggravating or alleviating factors except as outlined above   No unusual exposure hx or h/o childhood pna/ asthma or knowledge of premature birth.  Current Allergies, Complete Past Medical History, Past Surgical History, Family History, and Social History were reviewed in [employer] record.  ROS  The following are not active complaints unless bolded Hoarseness, sore throat, dysphagia, dental problems, itching, sneezing,  nasal congestion or discharge of excess mucus or purulent secretions, ear ache,   fever, chills, sweats, unintended wt loss or wt gain, classically pleuritic or exertional cp,  orthopnea pnd  or arm/hand swelling  or leg swelling, presyncope, palpitations, abdominal pain, anorexia, nausea, vomiting, diarrhea  or change in bowel habits or change in bladder habits, change in stools or change in urine, dysuria, hematuria,  rash, arthralgias, visual complaints, headache, numbness, weakness or ataxia or problems with walking or coordination,  change in mood or  Memory. tremor        Current Meds  Medication Sig  . albuterol (PROAIR HFA) 108 (90 Base) MCG/ACT inhaler Inhale 2 puffs into the lungs every 4 (four) hours as needed for wheezing or shortness of breath (cough).  Marland Kitchen albuterol (PROVENTIL) (2.5 MG/3ML) 0.083% nebulizer solution Take 3 mLs (2.5 mg total) by nebulization every 4 (four) hours as needed for wheezing or shortness of breath (cough).  . ALPRAZolam (XANAX) 0.5 MG tablet Take 1 tablet (0.5 mg total) by mouth 2 (two) times daily as needed for anxiety.  Marland Kitchen aspirin EC 81 MG tablet Take 81 mg by mouth daily.  . budesonide-formoterol (SYMBICORT) 160-4.5 MCG/ACT inhaler Take 2 puffs first thing in am and then another 2 puffs about 12 hours later.  . DULoxetine (CYMBALTA) 60 MG capsule Take 1 capsule (60 mg total) by mouth 2 (two) times daily.  . fluticasone (FLONASE) 50 MCG/ACT nasal spray Place 1-2 sprays into both nostrils daily as needed for allergies.   Marland Kitchen levothyroxine (SYNTHROID) 100 MCG tablet Take 1 tablet (100 mcg total) by mouth daily.  . mirtazapine (REMERON) 15 MG tablet Take 1 tablet (15 mg total) by mouth at bedtime.  Marland Kitchen OVER THE COUNTER MEDICATION Take 1 capsule by mouth daily. Amberen Multi-Symptom Menopause Relief  . oxyCODONE (OXY IR/ROXICODONE) 5 MG immediate release tablet Take 5 mg by mouth every 6 (six) hours as needed.  Marland Kitchen oxycodone (OXY-IR) 5 MG capsule Take 1 capsule (5 mg total) by mouth every 6 (six) hours as needed (for severe pain as needed).  . pantoprazole (PROTONIX) 40 MG tablet Take 1 tablet (40 mg total) by mouth 2 (two) times daily before a meal.  .  pregabalin (LYRICA) 50 MG capsule Take 1 capsule (50 mg total) by mouth 3 (three) times daily.  . rosuvastatin (CRESTOR) 40 MG tablet Take 40 mg by mouth at bedtime.   Marland Kitchen UNABLE TO FIND Med Name: OTC Amberen (for hot flashes)              Past Medical History:  Diagnosis Date  . Anemia   . Anxiety and depression   . Bronchitis   . Cancer ([place])    uterus  . CHF (congestive heart failure) ([place])   . Chronic pain 11/22/2010  . COPD (chronic obstructive pulmonary disease) ([place])   . Coronary artery disease   . Gastroesophageal reflux disease 11/22/2010  . Hyperlipidemia   . Hypertension   . Hypothyroid   . Migraine headache 11/22/2010  .  Near syncope   . On home oxygen therapy    started 08/2018  . Peripheral vascular disease ([place])   . Pneumonia   . Tobacco abuse    1/2 pack per day         Objective:     Chronically ill amb wf nad   Wt Readings from Last 3 Encounters:  07/21/19 124 lb (56.2 kg)  07/12/19 127 lb (57.6 kg)  06/11/19 127 lb (57.6 kg)     Vital signs reviewed - Note on arrival 02 sats  93% on 3lpm    Reports: Full dentures   HEENT : pt wearing mask not removed for exam due to covid - 19 concerns.    NECK :  without JVD/Nodes/TM/ nl carotid upstrokes bilaterally   LUNGS: no acc muscle use,  Mild barrel  contour chest wall with bilateral  Distant bs s audible wheeze and  without cough on insp or exp maneuvers  and mild  Hyperresonant  to  percussion bilaterally     CV:  RRR  no s3 or murmur or increase in P2, and trace bilateral lower ext pitting edema/ sym   ABD:  soft and nontender with pos end  insp Hoover's  in the supine position. No bruits or organomegaly appreciated, bowel sounds nl  MS:   Nl gait/  ext warm without deformities, calf tenderness, cyanosis or clubbing No obvious joint restrictions   SKIN: warm and dry without lesions    NEURO:  alert, approp, nl sensorium with  no motor or cerebellar deficits apparent. Pos resting  tremor                Assessment

## 2019-07-21 NOTE — Assessment & Plan Note (Signed)
Active smoker - Spirometry 07/17/15   FEV1 1.28 (53%)  Ratio 0.58 classic concave contour to f/v loop  - 12/08/2018   try increase symb to 80 2bid   -  Alpha one AT screen 12/08/2018  MM   Level 159  - 07/21/2019  After extensive coaching inhaler device,  effectiveness =    0% vs 75% with elipta so try change to trelegy if insurance covers  For some reason still able to inhale cigs but not hfa   Group D in terms of symptom/risk and laba/lama/ICS  therefore appropriate rx at this point >>>  Try trelegy    Advised:  formulary restrictions will be an ongoing challenge for the forseable future and I would be happy to pick an alternative if the pt will first  provide me a list of them -  pt  will need to return here for training for any new device that is required eg dpi vs hfa vs respimat.    In the meantime we can always provide samples so that the patient never runs out of any needed respiratory medications.

## 2019-07-21 NOTE — Assessment & Plan Note (Signed)
Counseled re importance of smoking cessation but did not meet time criteria for separate billing           Each maintenance medication was reviewed in detail including emphasizing most importantly the difference between maintenance and prns and under what circumstances the prns are to be triggered using an action plan format where appropriate.  Total time for H and P, chart review, counseling, teaching device and generating customized AVS unique to this office visit / charting = 30 min       

## 2019-07-21 NOTE — Patient Instructions (Addendum)
Plan A = Automatic = Always=    Trelegy one click first thing each am - take two good deep drags and hold for at least 5 5 seconds then out thru the nose   Plan B = Backup (to supplement plan A, not to replace it) Only use your albuterol PROAIR/red inhaler as a rescue medication to be used if you can't catch your breath by resting or doing a relaxed purse lip breathing pattern.  - The less you use it, the better it will work when you need it. - Ok to use the inhaler up to 2 puffs  every 4 hours if you must but call for appointment if use goes up over your usual need - Don't leave home without it !!  (think of it like the spare tire for your car)   Plan C = Crisis (instead of Plan B but only if Plan B stops working) - only use your albuterol nebulizer if you first try Plan B and it fails to help > ok to use the nebulizer up to every 4 hours but if start needing it regularly call for immediate appointment  Will ask adapt to do a best fit evaluation for your portable system.  Make sure you check your oxygen saturations at highest level of activity to be sure it stays over 90% and adjust upward to maintain this level if needed but remember to turn it back to previous settings when you stop (to conserve your supply).    Please schedule a follow up office visit in 6 weeks, call sooner if needed - bring all your inhalers and nebulizer solutions.

## 2019-07-28 DIAGNOSIS — J441 Chronic obstructive pulmonary disease with (acute) exacerbation: Secondary | ICD-10-CM | POA: Diagnosis not present

## 2019-08-16 ENCOUNTER — Ambulatory Visit: Payer: Medicare Other | Admitting: Gastroenterology

## 2019-08-16 ENCOUNTER — Encounter: Payer: Self-pay | Admitting: Internal Medicine

## 2019-08-16 NOTE — Progress Notes (Deleted)
Primary Care Physician: [name], [name], MD  Primary Gastroenterologist:  Formerly Barney Drain, MD   No chief complaint on file.   HPI: [name] is a 64 y.o. female here for follow-up.  Last seen in January 2021.   She was seen in September 2020 for hospital follow-up with history of decompensated CHF, acute kidney failure, anemia with hemoglobin 7.9, melena, heme positive stools.  Anemia multifactorial in the setting of chronic renal insufficiency/CHF, plus or minus evolving IDA.  While inpatient, elevated LFTs felt to be due to ischemia.  Negative hepatitis panel.  EGD in September with mild reactive gastropathy, negative small bowel biopsies.  Last colonoscopy possibly at [facility].  We attempted records but none received.  Subsequently declined colonoscopy.  We were able to get copies, last colonoscopy was September 2018 by Dr. Therisa Doyne.  She had a total of 11 polyps, 8 tubular adenomas.  One sessile serrated polyp removed piecemeal measuring almost 2 cm.  They advised a 1 year follow-up colonoscopy.  We try to contact patient for couple of times regarding recommendations but she did not return her calls.  Current Outpatient Medications  Medication Sig Dispense Refill  . albuterol (PROAIR HFA) 108 (90 Base) MCG/ACT inhaler Inhale 2 puffs into the lungs every 4 (four) hours as needed for wheezing or shortness of breath (cough). 18 g 1  . albuterol (PROVENTIL) (2.5 MG/3ML) 0.083% nebulizer solution Take 3 mLs (2.5 mg total) by nebulization every 4 (four) hours as needed for wheezing or shortness of breath (cough). 75 mL 12  . ALPRAZolam (XANAX) 0.5 MG tablet Take 1 tablet (0.5 mg total) by mouth 2 (two) times daily as needed for anxiety. 60 tablet 0  . aspirin EC 81 MG tablet Take 81 mg by mouth daily.    . DULoxetine (CYMBALTA) 60 MG capsule Take 1 capsule (60 mg total) by mouth 2 (two) times daily. 120 capsule 11  . fluticasone (FLONASE) 50 MCG/ACT nasal  spray Place 1-2 sprays into both nostrils daily as needed for allergies.     . Fluticasone-Umeclidin-Vilant (TRELEGY ELLIPTA) 100-62.5-25 MCG/INH AEPB Take one click 1st thing each am 1 each 11  . levothyroxine (SYNTHROID) 100 MCG tablet Take 1 tablet (100 mcg total) by mouth daily. 30 tablet 11  . mirtazapine (REMERON) 15 MG tablet Take 1 tablet (15 mg total) by mouth at bedtime. 30 tablet 5  . OVER THE COUNTER MEDICATION Take 1 capsule by mouth daily. Amberen Multi-Symptom Menopause Relief    . oxyCODONE (OXY IR/ROXICODONE) 5 MG immediate release tablet Take 5 mg by mouth every 6 (six) hours as needed.    Marland Kitchen oxycodone (OXY-IR) 5 MG capsule Take 1 capsule (5 mg total) by mouth every 6 (six) hours as needed (for severe pain as needed). 120 capsule 0  . pantoprazole (PROTONIX) 40 MG tablet Take 1 tablet (40 mg total) by mouth 2 (two) times daily before a meal. 180 tablet 3  . pregabalin (LYRICA) 50 MG capsule Take 1 capsule (50 mg total) by mouth 3 (three) times daily. 90 capsule 5  . rosuvastatin (CRESTOR) 40 MG tablet Take 40 mg by mouth at bedtime.     Marland Kitchen UNABLE TO FIND Med Name: OTC Amberen (for hot flashes)     No current facility-administered medications for this visit.    Allergies as of 08/16/2019 - Review Complete 07/21/2019  Allergen Reaction Noted  . Mirtazapine Other (See Comments) 01/11/2019  . Acetaminophen-codeine Nausea And Vomiting  04/19/2010  . Aspirin Nausea And Vomiting 04/19/2010  . Astelin [azelastine]  12/08/2018  . Bactrim [sulfamethoxazole-trimethoprim]  12/08/2018  . Celecoxib Other (See Comments) 04/19/2010  . Ciprofloxacin  12/08/2018  . Fentanyl  12/08/2018  . Levaquin [levofloxacin in d5w] Itching 02/21/2012  . Lipitor [atorvastatin] Other (See Comments) 04/22/2013  . Neomycin  12/08/2018  . Salicylates  22/29/7989  . Sulfa antibiotics Nausea And Vomiting 02/20/2012  . Adhesive [tape] Rash 03/06/2012  . Latex Rash 04/19/2010  . Tegretol [carbamazepine] Rash  12/08/2018    ROS:  General: Negative for anorexia, weight loss, fever, chills, fatigue, weakness. ENT: Negative for hoarseness, difficulty swallowing , nasal congestion. CV: Negative for chest pain, angina, palpitations, dyspnea on exertion, peripheral edema.  Respiratory: Negative for dyspnea at rest, dyspnea on exertion, cough, sputum, wheezing.  GI: See history of present illness. GU:  Negative for dysuria, hematuria, urinary incontinence, urinary frequency, nocturnal urination.  Endo: Negative for unusual weight change.    Physical Examination:   There were no vitals taken for this visit.  General: Well-nourished, well-developed in no acute distress.  Eyes: No icterus. Mouth: Oropharyngeal mucosa moist and pink , no lesions erythema or exudate. Lungs: Clear to auscultation bilaterally.  Heart: Regular rate and rhythm, no murmurs rubs or gallops.  Abdomen: Bowel sounds are normal, nontender, nondistended, no hepatosplenomegaly or masses, no abdominal bruits or hernia , no rebound or guarding.   Extremities: No lower extremity edema. No clubbing or deformities. Neuro: Alert and oriented x 4   Skin: Warm and dry, no jaundice.   Psych: Alert and cooperative, normal mood and affect.  Labs:  Lab Results  Component Value Date   CREATININE 1.35 (H) 06/11/2019   BUN 13 06/11/2019   NA 139 06/11/2019   K 2.8 (L) 06/11/2019   CL 96 (L) 06/11/2019   CO2 29 06/11/2019   Lab Results  Component Value Date   ALT 9 06/11/2019   AST 13 (L) 06/11/2019   ALKPHOS 90 06/11/2019   BILITOT 0.6 06/11/2019   Lab Results  Component Value Date   WBC 8.5 06/11/2019   HGB 11.9 (L) 06/11/2019   HCT 35.0 (L) 06/11/2019   MCV 88.0 06/11/2019   PLT 195 06/11/2019   Lab Results  Component Value Date   IRON 9 (L) 08/24/2018   TIBC 479 (H) 08/24/2018   FERRITIN 11 08/24/2018   Lab Results  Component Value Date   TSH 3.220 05/31/2019     Imaging Studies: No results found.

## 2019-08-17 ENCOUNTER — Telehealth: Payer: Self-pay

## 2019-08-17 ENCOUNTER — Other Ambulatory Visit: Payer: Self-pay | Admitting: Physical Medicine and Rehabilitation

## 2019-08-17 MED ORDER — OXYCODONE HCL 5 MG PO TABS: 5.0000 mg | ORAL_TABLET | Freq: Four times a day (QID) | ORAL | 0 refills | Status: DC | PRN

## 2019-08-17 NOTE — Telephone Encounter (Signed)
Patient called refill request for Oxycodone and Crestor

## 2019-08-17 NOTE — Telephone Encounter (Signed)
I don't refill Crestor- please tell pt to call PCP about this one and suggest when does call, to call all of meds together- already filled 2 meds for her today.

## 2019-08-27 DIAGNOSIS — J441 Chronic obstructive pulmonary disease with (acute) exacerbation: Secondary | ICD-10-CM | POA: Diagnosis not present

## 2019-09-01 ENCOUNTER — Ambulatory Visit: Payer: Medicare Other | Admitting: Internal Medicine

## 2019-09-01 ENCOUNTER — Encounter: Payer: Self-pay | Admitting: Internal Medicine

## 2019-09-01 ENCOUNTER — Other Ambulatory Visit: Payer: Self-pay

## 2019-09-01 VITALS — BP 110/66 | HR 96 | Temp 97.6°F | Ht 61.0 in | Wt 124.4 lb

## 2019-09-01 DIAGNOSIS — J441 Chronic obstructive pulmonary disease with (acute) exacerbation: Secondary | ICD-10-CM | POA: Diagnosis not present

## 2019-09-01 DIAGNOSIS — J9612 Chronic respiratory failure with hypercapnia: Secondary | ICD-10-CM | POA: Diagnosis not present

## 2019-09-01 DIAGNOSIS — J449 Chronic obstructive pulmonary disease, unspecified: Secondary | ICD-10-CM

## 2019-09-01 DIAGNOSIS — F17210 Nicotine dependence, cigarettes, uncomplicated: Secondary | ICD-10-CM | POA: Diagnosis not present

## 2019-09-01 DIAGNOSIS — J9611 Chronic respiratory failure with hypoxia: Secondary | ICD-10-CM | POA: Diagnosis not present

## 2019-09-01 NOTE — Assessment & Plan Note (Signed)
D/c from [facility] 08/27/18 with HC03 30 on 3lpm  12/08/2018  Patient Saturations on Room Air at Rest = 94%, while Ambulating = 87% Patient Saturations on 3  Liters of pulsed oxygen while Ambulating = 95%  >>> Referred for POC to [facility] again: Make sure you check your oxygen saturations at highest level of activity to be sure it stays over 90% and adjust upward to maintain this level if needed but remember to turn it back to previous settings when you stop (to conserve your supply).

## 2019-09-01 NOTE — Patient Instructions (Addendum)
We will see if we can qualify you for POC thru [facility]   Make sure you check your oxygen saturations at highest level of activity to be sure it stays over 90% and adjust upward to maintain this level if needed but remember to turn it back to previous settings when you stop (to conserve your supply).   The key is to stop smoking completely before smoking completely stops you!   Please schedule a follow up visit in 3 months but call sooner if needed

## 2019-09-01 NOTE — Assessment & Plan Note (Addendum)
Counseled re importance of smoking cessation but did not meet time criteria for separate billing           Each maintenance medication was reviewed in detail including emphasizing most importantly the difference between maintenance and prns and under what circumstances the prns are to be triggered using an action plan format where appropriate.  Total time for H and P, chart review, counseling, teaching device and generating customized AVS unique to this office visit / charting = 40 min  

## 2019-09-01 NOTE — Progress Notes (Signed)
Beverly [name], female    DOB: 11/15/1955      MRN: [id number]   Brief patient profile:  64 yowf MM/active smoker with onset somewhat limited by both breathing and back/ legs x 2017  With GOLD II criteria by spirometry  07/17/15   rx with proair avg sev times a week at most no maint rx then acutely ill for a few days weakness > sob  before admitted [facility]  With new acute resp failure/ severe hypoxemia.      Admission date:  08/24/2018  Admitting Physician  Beverly Roys, MD  Discharge Date:  08/27/2018   Primary MD  Beverly Gravel, MD  Recommendations for primary care physician for things to follow:   1)Very low-salt diet advised 2)Weigh yourself daily, call if you gain more than 3 pounds in 1 day or more than 5 pounds in 1 week as your diuretic medications may need to be adjusted 3)Limit your Fluid  intake to no more than 60 ounces (1.8 Liters) per day 4)Follow -up with gastroenterologist Beverly [name] at Address: [address], [place], [place] [address] Phone: [phone]---- most likely need upper endoscopy and colonoscopy 5)Avoid ibuprofen/Advil/Aleve/Motrin/Goody Powders/Naproxen/BC powders/Meloxicam/Diclofenac/Indomethacin and other Nonsteroidal anti-inflammatory medications as these will make you more likely to bleed and can cause stomach ulcers, can also cause Kidney problems.  6) you need repeat CBC and BMP blood test around Tuesday, 09/01/2018 7) absolutely no smoking around oxygen--this related to fire, injury and Death   Admission Diagnosis  Hypokalemia [E87.6] Pleural effusion [J90] Hypoxia [R09.02] AKI (acute kidney injury) ([place]) [N17.9] Gastrointestinal hemorrhage, unspecified gastrointestinal hemorrhage type [K92.2] Anemia, unspecified type [D64.9] Depression, unspecified depression type [F32.9] Community acquired pneumonia of right lung, unspecified part of lung [J18.9]   Discharge Diagnosis  Hypokalemia [E87.6] Pleural effusion [J90] Hypoxia  [R09.02] AKI (acute kidney injury) ([place]) [N17.9] Gastrointestinal hemorrhage, unspecified gastrointestinal hemorrhage type [K92.2] Anemia, unspecified type [D64.9] Depression, unspecified depression type [F32.9] Community acquired pneumonia of right lung, unspecified part of lung [J18.9]    Principal Problem:   Acute respiratory failure with hypoxia ([place]) Active Problems:   Anxiety and depression   HTN (hypertension)   COPD exacerbation ([facility])   Hypokalemia   Elevated LFTs   Anemia   Gastrointestinal hemorrhage          Past Medical History:  Diagnosis Date  . Anemia   . Anxiety and depression   . Bronchitis   . Cancer ([place])    uterus  . CHF (congestive heart failure) ([place])   . Chronic pain 11/22/2010  . COPD (chronic obstructive pulmonary disease) ([place])   . Gastroesophageal reflux disease 11/22/2010  . Hyperlipidemia   . Hypertension   . Hypothyroid   . Migraine headache 11/22/2010  . Near syncope   . Peripheral vascular disease ([place])   . Pneumonia   . Tobacco abuse    1/2 pack per day         Past Surgical History:  Procedure Laterality Date  . ABDOMINAL HYSTERECTOMY     without oophorectomy for neoplastic disease  . BACK SURGERY    . CARDIAC CATHETERIZATION  07/2013   normal coronary arteries  . CARPAL TUNNEL RELEASE     Left  . CHOLECYSTECTOMY  2007  . DILATION AND CURETTAGE OF UTERUS    . LAPAROTOMY    . LEFT HEART CATHETERIZATION WITH CORONARY ANGIOGRAM N/A 07/12/2013   Procedure: LEFT HEART CATHETERIZATION WITH CORONARY ANGIOGRAM;  Surgeon: Beverly Man, MD;  Location[facility]  CATH LAB;  Service: Cardiovascular;  Laterality: N/A;  . LUMBAR LAMINECTOMY  x3   Left; complicated by neurologic dysfunction  . NASAL SINUS SURGERY       HPI  from the history and physical done on the day of admission:     Patient coming from:Home  I have personally briefly reviewed patient's old medical records in [place]  Link  Chief Complaint:Weakness  TDV:VOHYWV [name] Beverly a 64 y.o.femalewith medical history significant forCHF, hypothyroidism, peripheral vascular disease, COPD, depression anxiety, hypertension, subclavian steal syndrome, CVA. At the time of my evaluation patient is alert and oriented,able to give me a history. Who presented to the ED with complaints of generalized weakness of 2 weeks,and confusion at night. Patient reports over the past 2 weeks she has barely eatenor drank anything that she has felt so depressed. No abdominal pain, no vomiting, she reports some occasional loose stools. She reports chronic difficulty breathing,but is unable to tell me if this has changed recently. She denies cough. No chest pain. No fever or chills. She reports bilateral lower extremity swelling of about 2 weeks duration now. She reports her normal weight is about 115pounds. She denies being on any fluid pills. She denies vomiting of blood, denies black stools or bloody stools. She denies NSAID use. She is unaware of a history of heart disease or stroke,though both of these are listed on her medical problems. Patient is supposed to be on CPAP at night and home oxygen,both of which she has not used in at least 4 years. Per patient spouse-he has been seeing blood in patient's underwear,and patient has been admitted to [facility] for same.  ED Course:Temperature 98. Respiratory rate 17-24, blood pressure systolic 1 1 7-1 2 9, O2 sats 68% on room air. Patient was initially lethargic and somewhat confused, this improved after patient was placed on 2 [name] nasal cannula she became more alert and oriented. After placing patient on O2 by nasal cannula, patient's been BNP elevated at 1260.Mild leukocytosis 12.4. Low potassium 2.6. Elevated creatinine 2.47. Elevated bicarb 33. Hemoglobin 7.9. EKG without significant change from prior. Stool occult positive. Portable chest x-ray shows  interval cardiomegaly and moderate-sized right pleural effusion with right basilar atelectasis or pneumonia. Patient started on IV ceftriaxone and azithromycin. Hospitalist to admit for community-acquired pneumonia.     Hospital Course:       A/p Acute respiratory failure with hypoxia-with reported dyspnea,---most likely due to pneumonia and CHF, patient continues to require supplemental oxygen, currently requiring 3 [name] via nasal cannula   HFpEF/dCHF history-decompensated. New 1+ pitting pedal edema bilaterally,marked elevated BNP 1260 ( was 67 fouryears ago)chest x-ray with moderate sized right pleural effusion, no echo on file.  PTA was Not on diuretics. She is unaware if she has gained weight. Denies chest pain. EKG unchanged.  -Diuresed well with Lasix,.  Okay to discharge home on p.o. Lasix -Echocardiogram with LVEF 50-55%  Rt Sided Pleural Effusion--- status post ultrasound-guided thoracentesis with removal of 600 mL of serous fluid, fluid studies pending, postthoracentesis chest x-ray shows significant improvement in right-sided pleural effusion and right lung aeration  Acute kidney injury-creatininewas2.47,improving, creatinine is down to 1.51,Last available creatinine 4 years ago baseline 0.6 - 0.7.-----  Generalized weakness-likely multifactorial-overall improved,  Metabolic encephalopathy-likely from hypoxia/hypercapnia due to CPAP non-compliance,and probably pneumonia.  -Mental statuschanges have resolved, patient is back to baseline  Acuteiron deficiencyanemia-hemoglobinis up to 9.6 post transfusion of 2 units of packed cells,Last check 4 years ago baseline 11-12. Stool occult  positive.She denies black stools or blood loss, orabdominal pain.Newton Pigg use. NoColonoscopy or EGD on file. Per patient spouse-he has been seeing blood in patient's underwear,and patient has been admitted to [facility] for  same. S/pFeraheme transfusion  - CBC a.m -GI following with possible EGD soon and records being obtained from [facility] --GI consult appreciated and recommend outpatient EGD with Dr. Katharine Look fields  Tobacco abuse/COPD exacerbation/OSA--Hypoxia persist, smoking cessation advised, continue bronchodilators and steroids, treated with Rocephin---Continue CPAP and supplemental oxygen  Anxiety/depression- reports depressed feelings over the past 2 weeks,which resulted in poor p.o. intake. Denies suicidal or homicidal ideations. -TTSpsychiatry consultation appreciated, they recommend outpatient follow-up  Disposition--- patient repeatedly came down to the nursing station, demanding to be discharged home or she would leave AMA. --It took a lot of pleading for patient to agree to right-sided thoracentesis prior to discharge --She wanted to be discharged home As possible refused to stay for further treatment or interventions --She repeatedly threatening to leave AMA so she was reluctantly discharged home with home O2 and home health services          History of Present Illness  12/08/2018  Pulmonary/ 1st office eval/Beadie Matsunaga  GOLD II/ still smoking on symb 36 'now and then"  And 02 24/7  Chief Complaint  Patient presents with  . Pulmonary Consult    Referred by Dr Deniece Ree. Pt c/o SOB since July 2020. She states she was hospitalized with CHF then and started on o2 3lpm continuous o2.   Dyspnea:  Better  But can't walk the dog yet, better on 02 3lpm cont Cough: none  Sleep: no resp symptoms  flat  SABA use: less than usual / nebulizer twice weekly at most  0 2 3lpm 24/7 does not titrate though has pulse ox rec Plan A = Automatic = Always=   Symbicort 160 Take 2 puffs first thing in am and then another 2 puffs about 12 hours later.  Work on inhaler technique:    Plan B = Backup (to supplement plan A, not to replace it) Only use your albuterol inhaler as a rescue medication to be  used if you can't catch your breath   Plan C = Crisis (instead of Plan B but only if Plan B stops working) - only use your albuterol nebulizer if you first try Plan B and it fails to help > ok to use the nebulizer up to every 4 hours but if start needing it regularly call for immediate appointment Goal is to keep your 02 saturation above 90%  The key is to stop smoking completely before smoking completely stops you!   07/21/2019  f/u ov/Correen Bubolz re: GOLD II/ still smoking but cutting down/ unable to use hfa correctly  / had vaccines for COVID 19  Chief Complaint  Patient presents with  . Follow-up    Breathing has been "off and on decent" c/o feeling light headed.    Dyspnea:  Walking dog around the outside of the house Cough: "it's from my drainage from allergies better with nasal spray/ mucoid  Sleeping: flat bed 2 pillows  SABA use:  Used saba am of ov neb but not the symbicort yet  02: 3lpm continuous rec Plan A = Automatic = Always=    Trelegy one click first thing each am - take two good deep drags and hold for at least 5 5 seconds then out thru the nose Plan B = Backup (to supplement plan A, not to replace it) Only use your albuterol  PROAIR/red inhaler as a rescue medication  Plan C = Crisis (instead of Plan B but only if Plan B stops working) - only use your albuterol nebulizer if you first try Plan B and it fails to help > ok to use the nebulizer up to every 4 hours but if start needing it regularly call for immediate appointment Will ask adapt to do a best fit evaluation for your portable system. Make sure you check your oxygen saturations at highest level of activity to be sure it stays over 90%.  Please schedule a follow up office visit in 6 weeks, call sooner if needed - bring all your inhalers and nebulizer solutions.   09/01/2019  f/u ov/Sontee Desena re:  GOLD III copd / trelegy maint  Chief Complaint  Patient presents with  . Follow-up    shortness of breath with exertion   Dyspnea:  Struggles to walk dog 100 ft on 3lpm pulsed but not checking sats  Cough: no  Sleeping: bed is flat 1-2 pillows  SABA use: much less now  02: 3 hs and up to 3 lpm walking    No obvious day to day or daytime variability or assoc excess/ purulent sputum or mucus plugs or hemoptysis or cp or chest tightness, subjective wheeze or overt sinus or hb symptoms.   Sleeping as above  without nocturnal  or early am exacerbation  of respiratory  c/o's or need for noct saba. Also denies any obvious fluctuation of symptoms with weather or environmental changes or other aggravating or alleviating factors except as outlined above   No unusual exposure hx or h/o childhood pna/ asthma or knowledge of premature birth.  Current Allergies, Complete Past Medical History, Past Surgical History, Family History, and Social History were reviewed in [employer] record.  ROS  The following are not active complaints unless bolded Hoarseness, sore throat, dysphagia, dental problems, itching, sneezing,  nasal congestion or discharge of excess mucus or purulent secretions, ear ache,   fever, chills, sweats, unintended wt loss or wt gain, classically pleuritic or exertional cp,  orthopnea pnd or arm/hand swelling  or leg swelling, presyncope, palpitations, abdominal pain, anorexia, nausea, vomiting, diarrhea  or change in bowel habits or change in bladder habits, change in stools or change in urine, dysuria, hematuria,  rash, arthralgias, visual complaints, headache, numbness, weakness or ataxia or problems with walking or coordination,  change in mood= anxious  or  memory.        Current Meds  Medication Sig  . albuterol (PROAIR HFA) 108 (90 Base) MCG/ACT inhaler Inhale 2 puffs into the lungs every 4 (four) hours as needed for wheezing or shortness of breath (cough).  Marland Kitchen albuterol (PROVENTIL) (2.5 MG/3ML) 0.083% nebulizer solution Take 3 mLs (2.5 mg total) by nebulization every 4 (four) hours  as needed for wheezing or shortness of breath (cough).  . ALPRAZolam (XANAX) 0.5 MG tablet TAKE 1 TABLET BY MOUTH TWICE DAILY AS NEEDED FOR ANXIETY.  Marland Kitchen aspirin EC 81 MG tablet Take 81 mg by mouth daily.  . DULoxetine (CYMBALTA) 60 MG capsule Take 1 capsule (60 mg total) by mouth 2 (two) times daily.  . fluticasone (FLONASE) 50 MCG/ACT nasal spray Place 1-2 sprays into both nostrils daily as needed for allergies.   . Fluticasone-Umeclidin-Vilant (TRELEGY ELLIPTA) 100-62.5-25 MCG/INH AEPB Take one click 1st thing each am  . levothyroxine (SYNTHROID) 100 MCG tablet Take 1 tablet (100 mcg total) by mouth daily.  . mirtazapine (REMERON) 15 MG tablet Take  1 tablet (15 mg total) by mouth at bedtime.  Marland Kitchen OVER THE COUNTER MEDICATION Take 1 capsule by mouth daily. Amberen Multi-Symptom Menopause Relief  . oxyCODONE (OXY IR/ROXICODONE) 5 MG immediate release tablet Take 1 tablet (5 mg total) by mouth every 6 (six) hours as needed.  . pantoprazole (PROTONIX) 40 MG tablet Take 1 tablet (40 mg total) by mouth 2 (two) times daily before a meal.  . pregabalin (LYRICA) 50 MG capsule TAKE 1 CAPSULE BY MOUTH THREE TIMES DAILY.  . rosuvastatin (CRESTOR) 40 MG tablet Take 40 mg by mouth at bedtime.   Marland Kitchen UNABLE TO FIND Med Name: OTC Amberen (for hot flashes)                 Past Medical History:  Diagnosis Date  . Anemia   . Anxiety and depression   . Bronchitis   . Cancer ([place])    uterus  . CHF (congestive heart failure) ([place])   . Chronic pain 11/22/2010  . COPD (chronic obstructive pulmonary disease) ([place])   . Coronary artery disease   . Gastroesophageal reflux disease 11/22/2010  . Hyperlipidemia   . Hypertension   . Hypothyroid   . Migraine headache 11/22/2010  . Near syncope   . On home oxygen therapy    started 08/2018  . Peripheral vascular disease ([place])   . Pneumonia   . Tobacco abuse    1/2 pack per day         Objective:      amb pleasant wf with smoker's rattle    09/01/2019       124 07/21/19 124 lb (56.2 kg)  07/12/19 127 lb (57.6 kg)  06/11/19 127 lb (57.6 kg)        Reports: Full dentures      HEENT : pt wearing mask not removed for exam due to covid -19 concerns.    NECK :  without JVD/Nodes/TM/ nl carotid upstrokes bilaterally   LUNGS: no acc muscle use,  Mod barrel  contour chest wall with bilateral  Distant bs s audible wheeze and  without cough on insp or exp maneuvers and mod  Hyperresonant  to  percussion bilaterally     CV:  RRR  no s3 or murmur or increase in P2, and no edema   ABD:  soft and nontender with pos mid insp Hoover's  in the supine position. No bruits or organomegaly appreciated, bowel sounds nl  MS:     ext warm without deformities, calf tenderness, cyanosis or clubbing No obvious joint restrictions   SKIN: warm and dry without lesions    NEURO:  alert, approp, nl sensorium with  no motor or cerebellar deficits apparent.                 Assessment

## 2019-09-01 NOTE — Assessment & Plan Note (Signed)
Active smoker - Spirometry 07/17/15   FEV1 1.28 (53%)  Ratio 0.58 classic concave contour to f/v loop  - 12/08/2018   try increase symb to 80 2bid   -  Alpha one AT screen 12/08/2018  MM   Level 159  - 07/21/2019  After extensive coaching inhaler device,  effectiveness =    0% vs 75% with elipta so try change to trelegy if insurance covers - 09/01/2019  After extensive coaching inhaler device,  effectiveness =    50% > continue saba hfa  prn    Group D in terms of symptom/risk and laba/lama/ICS  therefore appropriate rx at this point >>>  Continue trelegy      I spent extra time with pt today reviewing appropriate use of albuterol for prn use on exertion with the following points: 1) saba is for relief of sob that does not improve by walking a slower pace or resting but rather if the pt does not improve after trying this first. 2) If the pt is convinced, as many are, that saba helps recover from activity faster then it's easy to tell if this is the case by re-challenging : ie stop, take the inhaler, then p 5 minutes try the exact same activity (intensity of workload) that just caused the symptoms and see if they are substantially diminished or not after saba 3) if there is an activity that reproducibly causes the symptoms, try the saba 15 min before the activity on alternate days   If in fact the saba really does help, then fine to continue to use it prn but advised may need to look closer at the maintenance regimen being used to achieve better control of airways disease with exertion.

## 2019-09-06 ENCOUNTER — Ambulatory Visit: Payer: Medicare Other | Admitting: Internal Medicine

## 2019-09-21 ENCOUNTER — Other Ambulatory Visit: Payer: Self-pay | Admitting: Physical Medicine and Rehabilitation

## 2019-09-21 ENCOUNTER — Other Ambulatory Visit: Payer: Self-pay

## 2019-09-21 MED ORDER — ALPRAZOLAM 0.5 MG PO TABS: 0.5000 mg | ORAL_TABLET | Freq: Two times a day (BID) | ORAL | 3 refills | Status: DC | PRN

## 2019-09-21 MED ORDER — OXYCODONE HCL 5 MG PO TABS: 5.0000 mg | ORAL_TABLET | Freq: Four times a day (QID) | ORAL | 0 refills | Status: DC | PRN

## 2019-09-21 NOTE — Telephone Encounter (Signed)
Per patient: I am not out of medication. But wanted to make sure I do not run out.  Please advise or refill Thank you.

## 2019-09-27 DIAGNOSIS — J441 Chronic obstructive pulmonary disease with (acute) exacerbation: Secondary | ICD-10-CM | POA: Diagnosis not present

## 2019-10-08 ENCOUNTER — Encounter: Payer: Self-pay | Admitting: Physical Medicine and Rehabilitation

## 2019-10-08 ENCOUNTER — Encounter
Payer: Medicare Other | Attending: Physical Medicine and Rehabilitation | Admitting: Physical Medicine and Rehabilitation

## 2019-10-08 ENCOUNTER — Other Ambulatory Visit: Payer: Self-pay

## 2019-10-08 VITALS — BP 96/61 | HR 97 | Temp 97.8°F | Ht 61.0 in | Wt 124.0 lb

## 2019-10-08 DIAGNOSIS — J9612 Chronic respiratory failure with hypercapnia: Secondary | ICD-10-CM | POA: Diagnosis not present

## 2019-10-08 DIAGNOSIS — Z79891 Long term (current) use of opiate analgesic: Secondary | ICD-10-CM | POA: Diagnosis not present

## 2019-10-08 DIAGNOSIS — I950 Idiopathic hypotension: Secondary | ICD-10-CM | POA: Diagnosis not present

## 2019-10-08 DIAGNOSIS — J9611 Chronic respiratory failure with hypoxia: Secondary | ICD-10-CM

## 2019-10-08 DIAGNOSIS — F32A Depression, unspecified: Secondary | ICD-10-CM

## 2019-10-08 DIAGNOSIS — M5416 Radiculopathy, lumbar region: Secondary | ICD-10-CM | POA: Diagnosis not present

## 2019-10-08 DIAGNOSIS — Z5181 Encounter for therapeutic drug level monitoring: Secondary | ICD-10-CM | POA: Insufficient documentation

## 2019-10-08 DIAGNOSIS — G894 Chronic pain syndrome: Secondary | ICD-10-CM | POA: Diagnosis not present

## 2019-10-08 DIAGNOSIS — F329 Major depressive disorder, single episode, unspecified: Secondary | ICD-10-CM

## 2019-10-08 DIAGNOSIS — F419 Anxiety disorder, unspecified: Secondary | ICD-10-CM

## 2019-10-08 MED ORDER — MIDODRINE HCL 5 MG PO TABS: 5.0000 mg | ORAL_TABLET | Freq: Three times a day (TID) | ORAL | 3 refills | Status: DC

## 2019-10-08 MED ORDER — QUETIAPINE FUMARATE 25 MG PO TABS
25.0000 mg | ORAL_TABLET | Freq: Every day | ORAL | 5 refills | Status: DC
Start: 2019-10-08 — End: 2020-01-07

## 2019-10-08 NOTE — Progress Notes (Signed)
Subjective:    Patient ID: [name], female    DOB: 01/04/1956, 64 y.o.   MRN: [id number]  HPI  1. Pt is a 64 yr old female with Sciatica and chronic low back pain/lumbar radiculopathy s/p 3 lumbar surgeries in 1990s.Also has severe anxiety- was on Xanax 0.5 mg prior- andwason O2 full time and insomniaand hypothyroidism with increased Synthroid.   Went to [employer]- and did some dancing with her husband.   Pain is about stable.  Sleeping is worse, however-  "feels like her body gotten used it" Mind keeps working and racing.    Husband finally quitting smoking- finally decided at beach- not buying anymore cigarettes.  They are going to quit together!   Fell Saturday- fell at cookout this weekend.  Next thing she know, she fell down and was being picked off the [place].   BP 96/61 today sitting.      Pain Inventory Average Pain 6 Pain Right Now 6 My pain is sharp, burning, stabbing and tingling  In the last 24 hours, has pain interfered with the following? General activity 7 Relation with others 6 Enjoyment of life 7 What TIME of day is your pain at its worst? morning , daytime, evening and night Sleep (in general) Poor  Pain is worse with: sitting, standing and some activites Pain improves with: pacing activities and medication Relief from Meds: 7  Family History  Problem Relation Age of Onset  . Colon polyps Mother   . Deep vein thrombosis Mother   . Heart disease Mother   . Hypertension Mother   . Bleeding Disorder Mother   . Hypertension Sister   . Heart disease Brother        before age 74  . Hypertension Brother    Social History   Socioeconomic History  . Marital status: Married    Spouse name: Not on file  . Number of children: 2  . Years of education: Not on file  . Highest education level: Not on file  Occupational History    Comment: Disabled  Tobacco Use  . Smoking status: Current Every Day Smoker    Packs/day: 0.25    Years:  40.00    Pack years: 10.00    Types: Cigarettes    Start date: 09/06/1972  . Smokeless tobacco: Never Used  . Tobacco comment: 3-6 cigarettes per day-09/01/2019  Vaping Use  . Vaping Use: Never used  Substance and Sexual Activity  . Alcohol use: No    Alcohol/week: 0.0 standard drinks  . Drug use: No  . Sexual activity: Not Currently    Birth control/protection: Surgical  Other Topics Concern  . Not on file  Social History Narrative  . Not on file   Social Determinants of Health   Financial Resource Strain:   . Difficulty of Paying Living Expenses: Not on file  Food Insecurity:   . Worried About Charity fundraiser in the Last Year: Not on file  . Ran Out of Food in the Last Year: Not on file  Transportation Needs:   . Lack of Transportation (Medical): Not on file  . Lack of Transportation (Non-Medical): Not on file  Physical Activity:   . Days of Exercise per Week: Not on file  . Minutes of Exercise per Session: Not on file  Stress:   . Feeling of Stress : Not on file  Social Connections:   . Frequency of Communication with Friends and Family: Not on file  . Frequency  of Social Gatherings with Friends and Family: Not on file  . Attends Religious Services: Not on file  . Active Member of Clubs or Organizations: Not on file  . Attends Archivist Meetings: Not on file  . Marital Status: Not on file   Past Surgical History:  Procedure Laterality Date  . ABDOMINAL HYSTERECTOMY     without oophorectomy for neoplastic disease  . BACK SURGERY    . BIOPSY  10/22/2018   Procedure: BIOPSY;  Surgeon: Danie Binder, MD;  Location: [facility];  Service: Endoscopy;;  duodenal, gastric  . CARDIAC CATHETERIZATION  07/2013   normal coronary arteries  . CARPAL TUNNEL RELEASE     Left  . CHOLECYSTECTOMY  2007  . DILATION AND CURETTAGE OF UTERUS    . ESOPHAGOGASTRODUODENOSCOPY (EGD) WITH PROPOFOL N/A 10/22/2018   gastritis (mild reactive gastropathy, negative duodenal  biopsies for celiac).   Marland Kitchen LAPAROTOMY    . LEFT HEART CATHETERIZATION WITH CORONARY ANGIOGRAM N/A 07/12/2013   Procedure: LEFT HEART CATHETERIZATION WITH CORONARY ANGIOGRAM;  Surgeon: Leonie Man, MD;  Location: [facility] CATH LAB;  Service: Cardiovascular;  Laterality: N/A;  . LUMBAR LAMINECTOMY  x3   Left; complicated by neurologic dysfunction  . NASAL SINUS SURGERY     Past Surgical History:  Procedure Laterality Date  . ABDOMINAL HYSTERECTOMY     without oophorectomy for neoplastic disease  . BACK SURGERY    . BIOPSY  10/22/2018   Procedure: BIOPSY;  Surgeon: Danie Binder, MD;  Location: [facility];  Service: Endoscopy;;  duodenal, gastric  . CARDIAC CATHETERIZATION  07/2013   normal coronary arteries  . CARPAL TUNNEL RELEASE     Left  . CHOLECYSTECTOMY  2007  . DILATION AND CURETTAGE OF UTERUS    . ESOPHAGOGASTRODUODENOSCOPY (EGD) WITH PROPOFOL N/A 10/22/2018   gastritis (mild reactive gastropathy, negative duodenal biopsies for celiac).   Marland Kitchen LAPAROTOMY    . LEFT HEART CATHETERIZATION WITH CORONARY ANGIOGRAM N/A 07/12/2013   Procedure: LEFT HEART CATHETERIZATION WITH CORONARY ANGIOGRAM;  Surgeon: Leonie Man, MD;  Location[facility] C[facility] LAB;  Service: Cardiovascular;  Laterality: N/A;  . LUMBAR LAMINECTOMY  x3   Left; complicated by neurologic dysfunction  . NASAL SINUS SURGERY     Past Medical History:  Diagnosis Date  . Anemia   . Anxiety and depression   . Bronchitis   . Cancer ([place])    uterus  . CHF (congestive heart failure) ([place])   . Chronic pain 11/22/2010  . COPD (chronic obstructive pulmonary disease) ([place])   . Coronary artery disease   . Gastroesophageal reflux disease 11/22/2010  . Hyperlipidemia   . Hypertension   . Hypothyroid   . Migraine headache 11/22/2010  . Near syncope   . On home oxygen therapy    started 08/2018  . Peripheral vascular disease ([place])   . Pneumonia   . Tobacco abuse    1/2 pack per day   BP 96/61   Pulse 97   Temp 97.8 F (36.6  C)   Ht 5\' 1"  (1.549 m)   Wt 124 lb (56.2 kg)   SpO2 91%   BMI 23.43 kg/m   Opioid Risk Score:   Fall Risk Score:  `1  Depression screen PHQ 2/9  Depression screen PHQ 2/9 09/17/2018  Decreased Interest 0  Down, Depressed, Hopeless 0  PHQ - 2 Score 0  Altered sleeping 0  Tired, decreased energy 0  Change in appetite 1  Feeling bad  or failure about yourself  0  Trouble concentrating 0  Moving slowly or fidgety/restless 0  Suicidal thoughts 0  PHQ-9 Score 1  Difficult doing work/chores Not difficult at all    Review of Systems  Constitutional: Negative.   HENT: Negative.   Eyes: Negative.   Respiratory: Negative.   Cardiovascular: Negative.   Gastrointestinal: Negative.   Endocrine: Negative.   Genitourinary: Negative.   Musculoskeletal: Positive for back pain.  Skin: Negative.   Allergic/Immunologic: Negative.   Neurological: Positive for dizziness, weakness, light-headedness and numbness.       Tingling  Hematological: Negative.   Psychiatric/Behavioral: Positive for dysphoric mood. The patient is nervous/anxious.   All other systems reviewed and are negative.      Objective:   Physical Exam  Awake, alert, appropriate, tapping R leg and foot constantly. No acute distress Has O2 by [place] with her.  No assistive device Anxious        Assessment & Plan:   1. Pt is a 64 yr old female with Sciatica and chronic low back pain/lumbar radiculopathy s/p 3 lumbar surgeries in 1990s.Also has severe anxiety- was on Xanax 0.5 mg prior- andwason O2 full time and insomniaand hypothyroidism with increased Synthroid.   1. Has put self on sleeping routine- TV has to be off 1 hour before going to sleep.   2. D/C Mirtazipine/Remeron for sleep.  If need be, can take both- if need be, they won't interact.   3. Seroquel 12.5 to 25 mg nightly- for sleep- went over risk of death- slightly increase over baseline, but usually in Seroquel 100 mg or more nightly- Risk of her  pain meds and Xanax higher risk than this.   4. She and husband are quitting smoking! Together.   5. Con't Lyrica, Duloxetine, and pain meds.  Last refill 8/17- so just call for pain meds    6. Will add midodrine for 5 mg 3x/day for low BP take with meals- please don't lay FLAT after taking for 4 hours- can be at angle. With breathing, she doesn't lay flat. .    7. F/U in 3 months- earlier if need be.    I spent a total of 35 minutes on visit- as detailed above.

## 2019-10-08 NOTE — Patient Instructions (Signed)
1. Pt is a 64 yr old female with Sciatica and chronic low back pain/lumbar radiculopathy s/p 3 lumbar surgeries in 1990s.Also has severe anxiety- was on Xanax 0.5 mg prior- andwason O2 full time and insomniaand hypothyroidism with increased Synthroid.   1. Has put self on sleeping routine- TV has to be off 1 hour before going to sleep.   2. D/C Mirtazipine/Remeron for sleep.  If need be, can take both- if need be, they won't interact.   3. Seroquel 12.5 to 25 mg nightly- for sleep- went over risk of death- slightly increase over baseline, but usually in Seroquel 100 mg or more nightly- Risk of her pain meds and Xanax higher risk than this.   4. She and husband are quitting smoking! Together.   5. Con't Lyrica, Duloxetine, and pain meds.  Last refill 8/17- so just call for pain meds    6. Will add midodrine for 5 mg 3x/day for low BP take with meals- please don't lay FLAT after taking for 4 hours- can be at angle. With breathing, she doesn't lay flat. .    7. F/U in 3 months- earlier if need be.

## 2019-10-12 LAB — DRUG TOX MONITOR 1 W/CONF, ORAL FLD
Alprazolam: 1.35 ng/mL — ABNORMAL HIGH (ref <0.50)
Amphetamines: NEGATIVE ng/mL (ref <10)
Barbiturates: NEGATIVE ng/mL (ref <10)
Benzodiazepines: POSITIVE ng/mL — AB (ref <0.50)
Buprenorphine: NEGATIVE ng/mL (ref <0.10)
Chlordiazepoxide: NEGATIVE ng/mL (ref <0.50)
Clonazepam: NEGATIVE ng/mL (ref <0.50)
Cocaine: NEGATIVE ng/mL (ref <5.0)
Codeine: NEGATIVE ng/mL (ref <2.5)
Cotinine: 65 ng/mL — ABNORMAL HIGH (ref <5.0)
Diazepam: NEGATIVE ng/mL (ref <0.50)
Dihydrocodeine: NEGATIVE ng/mL (ref <2.5)
Fentanyl: NEGATIVE ng/mL (ref <0.10)
Flunitrazepam: NEGATIVE ng/mL (ref <0.50)
Flurazepam: NEGATIVE ng/mL (ref <0.50)
Heroin Metabolite: NEGATIVE ng/mL (ref <1.0)
Hydrocodone: NEGATIVE ng/mL (ref <2.5)
Hydromorphone: NEGATIVE ng/mL (ref <2.5)
Lorazepam: NEGATIVE ng/mL (ref <0.50)
MARIJUANA: NEGATIVE ng/mL (ref <2.5)
MDMA: NEGATIVE ng/mL (ref <10)
Meprobamate: NEGATIVE ng/mL (ref <2.5)
Methadone: NEGATIVE ng/mL (ref <5.0)
Midazolam: NEGATIVE ng/mL (ref <0.50)
Morphine: NEGATIVE ng/mL (ref <2.5)
Nicotine Metabolite: POSITIVE ng/mL — AB (ref <5.0)
Nordiazepam: NEGATIVE ng/mL (ref <0.50)
Norhydrocodone: NEGATIVE ng/mL (ref <2.5)
Noroxycodone: 62.8 ng/mL — ABNORMAL HIGH (ref <2.5)
Opiates: POSITIVE ng/mL — AB (ref <2.5)
Oxazepam: NEGATIVE ng/mL (ref <0.50)
Oxycodone: >250 ng/mL — ABNORMAL HIGH (ref <2.5)
Phencyclidine: NEGATIVE ng/mL (ref <10)
Tapentadol: NEGATIVE ng/mL (ref <5.0)
Temazepam: NEGATIVE ng/mL (ref <0.50)
Tramadol: NEGATIVE ng/mL (ref <5.0)
Triazolam: NEGATIVE ng/mL (ref <0.50)
Zolpidem: NEGATIVE ng/mL (ref <5.0)

## 2019-10-12 LAB — DRUG TOX ALC METAB W/CON, ORAL FLD: Alcohol Metabolite: NEGATIVE ng/mL (ref <25)

## 2019-10-19 ENCOUNTER — Other Ambulatory Visit: Payer: Self-pay | Admitting: Physical Medicine and Rehabilitation

## 2019-10-19 ENCOUNTER — Telehealth: Payer: Self-pay | Admitting: *Deleted

## 2019-10-19 MED ORDER — OXYCODONE HCL 5 MG PO TABS: 5.0000 mg | ORAL_TABLET | Freq: Four times a day (QID) | ORAL | 0 refills | Status: DC | PRN

## 2019-10-19 NOTE — Telephone Encounter (Signed)
Sent!

## 2019-10-19 NOTE — Telephone Encounter (Signed)
[name] called for a refill on her oxycodone IR 5 mg #120.  Per PMP it was last filled 09/21/19.

## 2019-10-20 ENCOUNTER — Telehealth: Payer: Self-pay | Admitting: *Deleted

## 2019-10-20 NOTE — Telephone Encounter (Signed)
Oral swab drug screen was consistent for prescribed medications.  ?

## 2019-10-28 DIAGNOSIS — J441 Chronic obstructive pulmonary disease with (acute) exacerbation: Secondary | ICD-10-CM | POA: Diagnosis not present

## 2019-11-23 ENCOUNTER — Ambulatory Visit: Payer: Medicare Other | Admitting: Internal Medicine

## 2019-11-24 ENCOUNTER — Ambulatory Visit: Payer: Medicare Other | Admitting: Internal Medicine

## 2019-11-27 DIAGNOSIS — J441 Chronic obstructive pulmonary disease with (acute) exacerbation: Secondary | ICD-10-CM | POA: Diagnosis not present

## 2019-11-29 ENCOUNTER — Other Ambulatory Visit: Payer: Self-pay | Admitting: Physical Medicine and Rehabilitation

## 2019-12-06 ENCOUNTER — Ambulatory Visit: Payer: Medicare Other | Admitting: Internal Medicine

## 2019-12-06 ENCOUNTER — Other Ambulatory Visit: Payer: Self-pay

## 2019-12-06 ENCOUNTER — Encounter: Payer: Self-pay | Admitting: Internal Medicine

## 2019-12-06 VITALS — BP 124/72 | HR 69 | Temp 97.1°F | Ht 62.0 in | Wt 124.0 lb

## 2019-12-06 DIAGNOSIS — F17210 Nicotine dependence, cigarettes, uncomplicated: Secondary | ICD-10-CM

## 2019-12-06 DIAGNOSIS — J449 Chronic obstructive pulmonary disease, unspecified: Secondary | ICD-10-CM

## 2019-12-06 DIAGNOSIS — J9612 Chronic respiratory failure with hypercapnia: Secondary | ICD-10-CM

## 2019-12-06 DIAGNOSIS — J9611 Chronic respiratory failure with hypoxia: Secondary | ICD-10-CM | POA: Diagnosis not present

## 2019-12-06 MED ORDER — ALBUTEROL SULFATE HFA 108 (90 BASE) MCG/ACT IN AERS: 2.0000 | INHALATION_SPRAY | RESPIRATORY_TRACT | 11 refills | Status: AC | PRN

## 2019-12-06 NOTE — Progress Notes (Signed)
[name], female    DOB: 03/18/1955      MRN: [id number]   Brief patient profile:  82 yowf MM/active smoker with onset somewhat limited by both breathing and back/ legs x 2017  With GOLD II criteria by spirometry  07/17/15   rx with proair avg sev times a week at most no maint rx then acutely ill for a few days weakness > sob  before admitted [place]  With new acute resp failure/ severe hypoxemia.      Admission date:  08/24/2018  Admitting Physician  Bethena Roys, MD  Discharge Date:  08/27/2018   Primary MD  Jani Gravel, MD  Recommendations for primary care physician for things to follow:   1)Very low-salt diet advised 2)Weigh yourself daily, call if you gain more than 3 pounds in 1 day or more than 5 pounds in 1 week as your diuretic medications may need to be adjusted 3)Limit your Fluid  intake to no more than 60 ounces (1.8 Liters) per day 4)Follow -up with gastroenterologist Dr. Barney Drain at Address: [address], [place], [place] [address] Phone: [phone]---- most likely need upper endoscopy and colonoscopy 5)Avoid ibuprofen/Advil/Aleve/Motrin/Goody Powders/Naproxen/BC powders/Meloxicam/Diclofenac/Indomethacin and other Nonsteroidal anti-inflammatory medications as these will make you more likely to bleed and can cause stomach ulcers, can also cause Kidney problems.  6) you need repeat CBC and BMP blood test around Tuesday, 09/01/2018 7) absolutely no smoking around oxygen--this related to fire, injury and Death   Admission Diagnosis  Hypokalemia [E87.6] Pleural effusion [J90] Hypoxia [R09.02] AKI (acute kidney injury) ([place]) [N17.9] Gastrointestinal hemorrhage, unspecified gastrointestinal hemorrhage type [K92.2] Anemia, unspecified type [D64.9] Depression, unspecified depression type [F32.9] Community acquired pneumonia of right lung, unspecified part of lung [J18.9]   Discharge Diagnosis  Hypokalemia [E87.6] Pleural effusion [J90] Hypoxia  [R09.02] AKI (acute kidney injury) ([place]) [N17.9] Gastrointestinal hemorrhage, unspecified gastrointestinal hemorrhage type [K92.2] Anemia, unspecified type [D64.9] Depression, unspecified depression type [F32.9] Community acquired pneumonia of right lung, unspecified part of lung [J18.9]    Principal Problem:   Acute respiratory failure with hypoxia ([place]) Active Problems:   Anxiety and depression   HTN (hypertension)   COPD exacerbation ([facility])   Hypokalemia   Elevated LFTs   Anemia   Gastrointestinal hemorrhage          Past Medical History:  Diagnosis Date   Anemia    Anxiety and depression    Bronchitis    Cancer ([place])    uterus   CHF (congestive heart failure) ([place])    Chronic pain 11/22/2010   COPD (chronic obstructive pulmonary disease) ([place])    Gastroesophageal reflux disease 11/22/2010   Hyperlipidemia    Hypertension    Hypothyroid    Migraine headache 11/22/2010   Near syncope    Peripheral vascular disease ([place])    Pneumonia    Tobacco abuse    1/2 pack per day         Past Surgical History:  Procedure Laterality Date   ABDOMINAL HYSTERECTOMY     without oophorectomy for neoplastic disease   BACK SURGERY     CARDIAC CATHETERIZATION  07/2013   normal coronary arteries   CARPAL TUNNEL RELEASE     Left   CHOLECYSTECTOMY  2007   DILATION AND CURETTAGE OF UTERUS     LAPAROTOMY     LEFT HEART CATHETERIZATION WITH CORONARY ANGIOGRAM N/A 07/12/2013   Procedure: LEFT HEART CATHETERIZATION WITH CORONARY ANGIOGRAM;  Surgeon: Leonie Man, MD;  Location: [facility]  CATH LAB;  Service: Cardiovascular;  Laterality: N/A;   LUMBAR LAMINECTOMY  x3   Left; complicated by neurologic dysfunction   NASAL SINUS SURGERY       HPI  from the history and physical done on the day of admission:     Patient coming from:Home  I have personally briefly reviewed patient's old medical records in [facility]  Link  Chief Complaint:Weakness  [name] a 64 y.o.femalewith medical history significant forCHF, hypothyroidism, peripheral vascular disease, COPD, depression anxiety, hypertension, subclavian steal syndrome, CVA. At the time of my evaluation patient is alert and oriented,able to give me a history. Who presented to the ED with complaints of generalized weakness of 2 weeks,and confusion at night. Patient reports over the past 2 weeks she has barely eatenor drank anything that she has felt so depressed. No abdominal pain, no vomiting, she reports some occasional loose stools. She reports chronic difficulty breathing,but is unable to tell me if this has changed recently. She denies cough. No chest pain. No fever or chills. She reports bilateral lower extremity swelling of about 2 weeks duration now. She reports her normal weight is about 115pounds. She denies being on any fluid pills. She denies vomiting of blood, denies black stools or bloody stools. She denies NSAID use. She is unaware of a history of heart disease or stroke,though both of these are listed on her medical problems. Patient is supposed to be on CPAP at night and home oxygen,both of which she has not used in at least 4 years. Per patient spouse-he has been seeing blood in patient's underwear,and patient has been admitted to [facility] for same.  ED Course:Temperature 98. Respiratory rate 17-24, blood pressure systolic 1 1 7-1 2 9, O2 sats 68% on room air. Patient was initially lethargic and somewhat confused, this improved after patient was placed on 2 L nasal cannula she became more alert and oriented. After placing patient on O2 by nasal cannula, patient's been BNP elevated at 1260.Mild leukocytosis 12.4. Low potassium 2.6. Elevated creatinine 2.47. Elevated bicarb 33. Hemoglobin 7.9. EKG without significant change from prior. Stool occult positive. Portable chest x-ray shows  interval cardiomegaly and moderate-sized right pleural effusion with right basilar atelectasis or pneumonia. Patient started on IV ceftriaxone and azithromycin. Hospitalist to admit for community-acquired pneumonia.     Hospital Course:       A/p Acute respiratory failure with hypoxia-with reported dyspnea,---most likely due to pneumonia and CHF, patient continues to require supplemental oxygen, currently requiring 3 L via nasal cannula   HFpEF/dCHF history-decompensated. New 1+ pitting pedal edema bilaterally,marked elevated BNP 1260 ( was 67 fouryears ago)chest x-ray with moderate sized right pleural effusion, no echo on file.  PTA was Not on diuretics. She is unaware if she has gained weight. Denies chest pain. EKG unchanged.  -Diuresed well with Lasix,.  Okay to discharge home on p.o. Lasix -Echocardiogram with LVEF 50-55%  Rt Sided Pleural Effusion--- status post ultrasound-guided thoracentesis with removal of 600 mL of serous fluid, fluid studies pending, postthoracentesis chest x-ray shows significant improvement in right-sided pleural effusion and right lung aeration  Acute kidney injury-creatininewas2.47,improving, creatinine is down to 1.51,Last available creatinine 4 years ago baseline 0.6 - 0.7.-----  Generalized weakness-likely multifactorial-overall improved,  Metabolic encephalopathy-likely from hypoxia/hypercapnia due to CPAP non-compliance,and probably pneumonia.  -Mental statuschanges have resolved, patient is back to baseline  Acuteiron deficiencyanemia-hemoglobinis up to 9.6 post transfusion of 2 units of packed cells,Last check 4 years ago baseline 11-12. Stool occult  positive.She denies black stools or blood loss, orabdominal pain.Newton Pigg use. NoColonoscopy or EGD on file. Per patient spouse-he has been seeing blood in patient's underwear,and patient has been [facility] to [facility] for  same. S/pFeraheme transfusion  - CBC a.m -GI following with possible EGD soon and records being obtained from [facility] --GI consult appreciated and recommend outpatient EGD with Dr. Katharine Look fields  Tobacco abuse/COPD exacerbation/OSA--Hypoxia persist, smoking cessation advised, continue bronchodilators and steroids, treated with Rocephin---Continue CPAP and supplemental oxygen  Anxiety/depression- reports depressed feelings over the past 2 weeks,which resulted in poor p.o. intake. Denies suicidal or homicidal ideations. -TTSpsychiatry consultation appreciated, they recommend outpatient follow-up  Disposition--- patient repeatedly came down to the nursing station, demanding to be discharged home or she would leave AMA. --It took a lot of pleading for patient to agree to right-sided thoracentesis prior to discharge --She wanted to be discharged home As possible refused to stay for further treatment or interventions --She repeatedly threatening to leave AMA so she was reluctantly discharged home with home O2 and home health services          History of Present Illness  12/08/2018  Pulmonary/ 1st office eval/Juron Vorhees  GOLD II/ still smoking on symb 40 'now and then"  And 02 24/7  Chief Complaint  Patient presents with   Pulmonary Consult    Referred by Dr Deniece Ree. Pt c/o SOB since July 2020. She states she was hospitalized with CHF then and started on o2 3lpm continuous o2.   Dyspnea:  Better  But can't walk the dog yet, better on 02 3lpm cont Cough: none  Sleep: no resp symptoms  flat  SABA use: less than usual / nebulizer twice weekly at most  0 2 3lpm 24/7 does not titrate though has pulse ox rec Plan A = Automatic = Always=   Symbicort 160 Take 2 puffs first thing in am and then another 2 puffs about 12 hours later.  Work on inhaler technique:    Plan B = Backup (to supplement plan A, not to replace it) Only use your albuterol inhaler as a rescue medication to be  used if you can't catch your breath   Plan C = Crisis (instead of Plan B but only if Plan B stops working) - only use your albuterol nebulizer if you first try Plan B and it fails to help > ok to use the nebulizer up to every 4 hours but if start needing it regularly call for immediate appointment Goal is to keep your 02 saturation above 90%  The key is to stop smoking completely before smoking completely stops you!   07/21/2019  f/u ov/Taylee Gunnells re: GOLD II/ still smoking but cutting down/ unable to use hfa correctly  / had vaccines for COVID 16  Chief Complaint  Patient presents with   Follow-up    Breathing has been "off and on decent" c/o feeling light headed.    Dyspnea:  Walking dog around the outside of the house Cough: "it's from my drainage from allergies better with nasal spray/ mucoid  Sleeping: flat bed 2 pillows  SABA use:  Used saba am of ov neb but not the symbicort yet  02: 3lpm continuous rec Plan A = Automatic = Always=    Trelegy one click first thing each am - take two good deep drags and hold for at least 5 5 seconds then out thru the nose Plan B = Backup (to supplement plan A, not to replace it) Only use your albuterol  PROAIR/red inhaler as a rescue medication  Plan C = Crisis (instead of Plan B but only if Plan B stops working) - only use your albuterol nebulizer if you first try Plan B and it fails to help > ok to use the nebulizer up to every 4 hours but if start needing it regularly call for immediate appointment Will ask adapt to do a best fit evaluation for your portable system. Make sure you check your oxygen saturations at highest level of activity to be sure it stays over 90%.  Please schedule a follow up office visit in 6 weeks, call sooner if needed - bring all your inhalers and nebulizer solutions.   09/01/2019  f/u ov/Ashiyah Pavlak re:  GOLD III copd / trelegy maint  Chief Complaint  Patient presents with   Follow-up    shortness of breath with exertion   Dyspnea:  Struggles to walk dog 100 ft on 3lpm pulsed but not checking sats  Cough: no  Sleeping: bed is flat 1-2 pillows  SABA use: much less now  02: 3 hs and up to 3 lpm walking  rec We will see if we can qualify you for POC thru [place]  Make sure you check your oxygen saturations at highest level of activity to be sure it stays over 90%  The key is to stop smoking completely before smoking completely stops you!   12/06/2019  f/u ov/Tyshea Imel re:  trelegy  GOLD III copd/ 02 dep  Chief Complaint  Patient presents with   Follow-up    nasal congestion   Dyspnea:  750 feet to get mail  Cough: minimal am mucoid Sleeping one pillow bed flat  SABA use: much less  02: 2lpm sitting  Usually 90s/      3lpm at hs / not walking on 02 though   No obvious day to day or daytime variability or assoc excess/ purulent sputum or mucus plugs or hemoptysis or cp or chest tightness, subjective wheeze or overt   hb symptoms.   Sleeping ok as above without nocturnal    exacerbation  of respiratory  c/o's or need for noct saba. Also denies any obvious fluctuation of symptoms with weather or environmental changes or other aggravating or alleviating factors except as outlined above   No unusual exposure hx or h/o childhood pna/ asthma or knowledge of premature birth.  Current Allergies, Complete Past Medical History, Past Surgical History, Family History, and Social History were reviewed in [employer] record.  ROS  The following are not active complaints unless bolded Hoarseness, sore throat, dysphagia, dental problems, itching, sneezing,  nasal congestion or discharge of excess mucus or purulent secretions, ear ache,   fever, chills, sweats, unintended wt loss or wt gain, classically pleuritic or exertional cp,  orthopnea pnd or arm/hand swelling  or leg swelling, presyncope, palpitations, abdominal pain, anorexia, nausea, vomiting, diarrhea  or change in bowel habits or  change in bladder habits, change in stools or change in urine, dysuria, hematuria,  rash, arthralgias, visual complaints, headache, numbness, weakness or ataxia or problems with walking or coordination,  change in mood or  memory.        No outpatient medications have been marked as taking for the 12/06/19 encounter (Office Visit) with Tanda Rockers, MD.                 Past Medical History:  Diagnosis Date   Anemia    Anxiety and depression    Bronchitis  Cancer [facility])    uterus   CHF (congestive heart failure) (H[facility])    Chronic pain 11/22/2010   COPD (chronic obstructive pulmonary disease) (HCC)    Coronary artery disease    Gastroesophageal reflux disease 11/22/2010   Hyperlipidemia    Hypertension    Hypothyroid    Migraine headache 11/22/2010   Near syncope    On home oxygen therapy    started 08/2018   Peripheral vascular disease ([place])    Pneumonia    Tobacco abuse    1/2 pack per day         Objective:     amb wf still has smoker's rattle   12/06/2019       124   09/01/2019      124 07/21/19 124 lb (56.2 kg)  07/12/19 127 lb (57.6 kg)  06/11/19 127 lb (57.6 kg)    Vital signs reviewed  12/06/2019  - Note at rest 02 sats  84% on RA  And 95% on 2lpm Pulsed     Reports: Full dentures   HEENT : pt wearing mask not removed for exam due to covid -19 concerns.    NECK :  without JVD/Nodes/TM/ nl carotid upstrokes bilaterally   LUNGS: no acc muscle use,  Mod barrel  contour chest wall with bilateral  Distant bs s audible wheeze and  without cough on insp or exp maneuvers and mod  Hyperresonant  to  percussion bilaterally     CV:  RRR  no s3 or murmur or increase in P2, and no edema   ABD:  soft and nontender with pos mid insp Hoover's  in the supine position. No bruits or organomegaly appreciated, bowel sounds nl  MS:     ext warm without deformities, calf tenderness, cyanosis or clubbing No obvious joint restrictions   SKIN: warm  and dry without lesions    NEURO:  alert, approp, nl sensorium with  no motor or cerebellar deficits apparent.                    Assessment

## 2019-12-06 NOTE — Patient Instructions (Signed)
The key is to stop smoking completely before smoking completely stops you!  Make sure you check your oxygen saturations at highest level of activity to be sure it stays over 90% and adjust  02 flow upward to maintain this level if needed but remember to turn it back to previous settings when you stop (to conserve your supply).    Please schedule a follow up visit in 3 months but call sooner if needed

## 2019-12-07 ENCOUNTER — Encounter: Payer: Self-pay | Admitting: Internal Medicine

## 2019-12-07 NOTE — Assessment & Plan Note (Signed)
Active smoker - Spirometry 07/17/15   FEV1 1.28 (53%)  Ratio 0.58 classic concave contour to f/v loop  - 12/08/2018   try increase symb to 80 2bid   -  Alpha one AT screen 12/08/2018  MM   Level 159  - 07/21/2019  After extensive coaching inhaler device,  effectiveness =    0% vs 75% with elipta so try change to trelegy if insurance covers - 09/01/2019  After extensive coaching inhaler device,  effectiveness =    50% with hfa> continue saba hfa  prn    Group D in terms of symptom/risk and laba/lama/ICS  therefore appropriate rx at this point >>>  Continue trelegy and prn saba

## 2019-12-07 NOTE — Assessment & Plan Note (Signed)
D/c from [facility] 08/27/18 with HC03 30 on 3lpm  12/08/2018  Patient Saturations on Room Air at Rest = 94%, while Ambulating = 87% Patient Saturations on 3  Liters of pulsed oxygen while Ambulating = 95% -  12/06/2019   Required 4lpm pulsed to walk 250 ft moderate pace with sats 92% at end and denied sob but desaturated walking on 3lpm pulsed.   She has very poor insight on how / when to use 02   rec 2lpm pulse resting is ok but up to 4lpm pulsed walking more than room to room at home with goal to keep > 90% at all times          Each maintenance medication was reviewed in detail including emphasizing most importantly the difference between maintenance and prns and under what circumstances the prns are to be triggered using an action plan format where appropriate.  Total time for H and P, chart review, counseling,  directly observing portions of ambulatory 02 saturation study/  and generating customized AVS unique to this office visit / charting = 21 min

## 2019-12-07 NOTE — Assessment & Plan Note (Signed)
Counseled re importance of smoking cessation but did not meet time criteria for separate billing   °

## 2019-12-28 DIAGNOSIS — J441 Chronic obstructive pulmonary disease with (acute) exacerbation: Secondary | ICD-10-CM | POA: Diagnosis not present

## 2020-01-04 ENCOUNTER — Telehealth: Payer: Self-pay | Admitting: Physical Medicine and Rehabilitation

## 2020-01-04 ENCOUNTER — Other Ambulatory Visit: Payer: Self-pay | Admitting: Physical Medicine and Rehabilitation

## 2020-01-04 NOTE — Telephone Encounter (Signed)
Patient would like to have medication refilled

## 2020-01-04 NOTE — Telephone Encounter (Signed)
I contacted the patient and left a voicemail asking patient to call back and be more specific as to which medication she needs refilled

## 2020-01-05 NOTE — Telephone Encounter (Signed)
Patient called back asking for refills on her oxycodone and xanax prescriptions

## 2020-01-06 MED ORDER — ALPRAZOLAM 0.5 MG PO TABS: 0.5000 mg | ORAL_TABLET | Freq: Two times a day (BID) | ORAL | 3 refills | Status: DC | PRN

## 2020-01-06 NOTE — Telephone Encounter (Signed)
Have sent in both medications- thank you

## 2020-01-06 NOTE — Addendum Note (Signed)
Addended by: Courtney Heys on: 01/06/2020 10:05 AM   Modules accepted: Orders

## 2020-01-07 ENCOUNTER — Encounter
Payer: Medicare Other | Attending: Physical Medicine and Rehabilitation | Admitting: Physical Medicine and Rehabilitation

## 2020-01-07 ENCOUNTER — Encounter: Payer: Self-pay | Admitting: Physical Medicine and Rehabilitation

## 2020-01-07 ENCOUNTER — Other Ambulatory Visit: Payer: Self-pay

## 2020-01-07 VITALS — BP 149/52 | HR 60 | Temp 98.6°F

## 2020-01-07 DIAGNOSIS — M5116 Intervertebral disc disorders with radiculopathy, lumbar region: Secondary | ICD-10-CM | POA: Insufficient documentation

## 2020-01-07 DIAGNOSIS — M5432 Sciatica, left side: Secondary | ICD-10-CM | POA: Diagnosis not present

## 2020-01-07 DIAGNOSIS — I951 Orthostatic hypotension: Secondary | ICD-10-CM

## 2020-01-07 DIAGNOSIS — G894 Chronic pain syndrome: Secondary | ICD-10-CM | POA: Diagnosis not present

## 2020-01-07 MED ORDER — QUETIAPINE FUMARATE 50 MG PO TABS
50.0000 mg | ORAL_TABLET | Freq: Every day | ORAL | 3 refills | Status: DC
Start: 2020-01-07 — End: 2020-10-12

## 2020-01-07 NOTE — Progress Notes (Signed)
Subjective:    Patient ID: [name], female    DOB: [birth year], 64 y.o.   MRN: [id number]  HPI 1. Pt is a 64 yr old female with Sciatica and chronic low back pain/lumbar radiculopathy s/p 3 lumbar surgeries in 1990s.Also has severe anxiety- was on Xanax 0.5 mg prior- andwason O2 full time and insomniaand hypothyroidism with increased Synthroid.  Had a panic attack on day of thanksgiving- so didn't fix more than [place]- didn't eat any of it.   Having panic attacks.    Was given a name for psychiatric care- insurance company- currently doesn't have a psychiatrist.   Taking the Midodrine 3x/day meals-  BP was running low still- today it's 149/52- has a BP machine or can take manual at home- 70s/50s at home. Nowhere near 90s.     Seroquel helped initially- taking Seroquel 25 mg nightly- worked better initially, but not as good now.   Face has stayed swollen lately-    Pain Inventory Average Pain 7 Pain Right Now 6 My pain is sharp, burning and stabbing  In the last 24 hours, has pain interfered with the following? General activity 5 Relation with others 5 Enjoyment of life 0 What TIME of day is your pain at its worst? morning  Sleep (in general) Fair  Pain is worse with: bending, sitting and standing Pain improves with: medication Relief from Meds: 7  Family History  Problem Relation Age of Onset  . Colon polyps Mother   . Deep vein thrombosis Mother   . Heart disease Mother   . Hypertension Mother   . Bleeding Disorder Mother   . Hypertension Sister   . Heart disease Brother        before age 6  . Hypertension Brother    Social History   Socioeconomic History  . Marital status: Married    Spouse name: Not on file  . Number of children: 2  . Years of education: Not on file  . Highest education level: Not on file  Occupational History    Comment: Disabled  Tobacco Use  . Smoking status: Current Every Day Smoker    Packs/day: 0.25    Years:  40.00    Pack years: 10.00    Types: Cigarettes    Start date: 09/06/1972  . Smokeless tobacco: Never Used  . Tobacco comment: 2 cigarettes per day-12/06/2019  Vaping Use  . Vaping Use: Never used  Substance and Sexual Activity  . Alcohol use: No    Alcohol/week: 0.0 standard drinks  . Drug use: No  . Sexual activity: Not Currently    Birth control/protection: Surgical  Other Topics Concern  . Not on file  Social History Narrative  . Not on file   Social Determinants of Health   Financial Resource Strain:   . Difficulty of Paying Living Expenses: Not on file  Food Insecurity:   . Worried About Charity fundraiser in the Last Year: Not on file  . Ran Out of Food in the Last Year: Not on file  Transportation Needs:   . Lack of Transportation (Medical): Not on file  . Lack of Transportation (Non-Medical): Not on file  Physical Activity:   . Days of Exercise per Week: Not on file  . Minutes of Exercise per Session: Not on file  Stress:   . Feeling of Stress : Not on file  Social Connections:   . Frequency of Communication with Friends and Family: Not on file  .  Frequency of Social Gatherings with Friends and Family: Not on file  . Attends Religious Services: Not on file  . Active Member of Clubs or Organizations: Not on file  . Attends Archivist Meetings: Not on file  . Marital Status: Not on file   Past Surgical History:  Procedure Laterality Date  . ABDOMINAL HYSTERECTOMY     without oophorectomy for neoplastic disease  . BACK SURGERY    . BIOPSY  10/22/2018   Procedure: BIOPSY;  Surgeon: Danie Binder, MD;  [facility];  Service: Endoscopy;;  duodenal, gastric  . CARDIAC CATHETERIZATION  07/2013   normal coronary arteries  . CARPAL TUNNEL RELEASE     Left  . CHOLECYSTECTOMY  2007  . DILATION AND CURETTAGE OF UTERUS    . ESOPHAGOGASTRODUODENOSCOPY (EGD) WITH PROPOFOL N/A 10/22/2018   gastritis (mild reactive gastropathy, negative duodenal  biopsies for celiac).   Marland Kitchen LAPAROTOMY    . LEFT HEART CATHETERIZATION WITH CORONARY ANGIOGRAM N/A 07/12/2013   Procedure: LEFT HEART CATHETERIZATION WITH CORONARY ANGIOGRAM;  Surgeon: Leonie Man, MD;  Location: [facility] CATH LAB;  Service: Cardiovascular;  Laterality: N/A;  . LUMBAR LAMINECTOMY  x3   Left; complicated by neurologic dysfunction  . NASAL SINUS SURGERY     Past Surgical History:  Procedure Laterality Date  . ABDOMINAL HYSTERECTOMY     without oophorectomy for neoplastic disease  . BACK SURGERY    . BIOPSY  10/22/2018   Procedure: BIOPSY;  Surgeon: Danie Binder, MD;  [facility];  Service: Endoscopy;;  duodenal, gastric  . CARDIAC CATHETERIZATION  07/2013   normal coronary arteries  . CARPAL TUNNEL RELEASE     Left  . CHOLECYSTECTOMY  2007  . DILATION AND CURETTAGE OF UTERUS    . ESOPHAGOGASTRODUODENOSCOPY (EGD) WITH PROPOFOL N/A 10/22/2018   gastritis (mild reactive gastropathy, negative duodenal biopsies for celiac).   Marland Kitchen LAPAROTOMY    . LEFT HEART CATHETERIZATION WITH CORONARY ANGIOGRAM N/A 07/12/2013   Procedure: LEFT HEART CATHETERIZATION WITH CORONARY ANGIOGRAM;  Surgeon: Leonie Man, MD;  Location: [facility] CATH LAB;  Service: Cardiovascular;  Laterality: N/A;  . LUMBAR LAMINECTOMY  x3   Left; complicated by neurologic dysfunction  . NASAL SINUS SURGERY     Past Medical History:  Diagnosis Date  . Anemia   . Anxiety and depression   . Bronchitis   . Cancer ([place])    uterus  . CHF (congestive heart failure) ([place])   . Chronic pain 11/22/2010  . COPD (chronic obstructive pulmonary disease) ([place])   . Coronary artery disease   . Gastroesophageal reflux disease 11/22/2010  . Hyperlipidemia   . Hypertension   . Hypothyroid   . Migraine headache 11/22/2010  . Near syncope   . On home oxygen therapy    started 08/2018  . Peripheral vascular disease ([place])   . Pneumonia   . Tobacco abuse    1/2 pack per day   BP (!) 149/52   Pulse 60   Temp 98.6 F  (37 C)   SpO2 (!) 88%   Opioid Risk Score:   Fall Risk Score:  `1  Depression screen PHQ 2/9  Depression screen PHQ 2/9 09/17/2018  Decreased Interest 0  Down, Depressed, Hopeless 0  PHQ - 2 Score 0  Altered sleeping 0  Tired, decreased energy 0  Change in appetite 1  Feeling bad or failure about yourself  0  Trouble concentrating 0  Moving slowly or fidgety/restless 0  Suicidal thoughts 0  PHQ-9 Score 1  Difficult doing work/chores Not difficult at all    Review of Systems  Constitutional: Negative.   HENT: Negative.   Eyes: Negative.   Respiratory: Positive for shortness of breath.   Cardiovascular: Negative.   Gastrointestinal: Negative.   Endocrine: Negative.   Genitourinary: Negative.   Musculoskeletal: Positive for back pain and gait problem.  Skin: Negative.   Allergic/Immunologic: Negative.   Neurological: Positive for dizziness and light-headedness.  Psychiatric/Behavioral: Positive for dysphoric mood. The patient is nervous/anxious.   All other systems reviewed and are negative.      Objective:   Physical Exam  Awake, alert, appropriate, raspy/chronic voice, on O2, NAD Tapping R foot/hand constantly. No JVD seen        Assessment & Plan:   1. Pt is a 64 yr old female with Sciatica and chronic low back pain/lumbar radiculopathy s/p 3 lumbar surgeries in 1990s.Also has severe anxiety- was on Xanax 0.5 mg prior- andwason O2 full time and insomniaand hypothyroidism with increased Synthroid.   1. On Midodrine, still having dizziness/lightheaded in spite of taking the medicine- and BP running really low- will refer to Cardiology since BP is SO low and almost passed out prior to appointment today.   2. Continue Midodrine for now until sees Cardiology. Can make BP a little high, but would rather it be a little high than low.   3. Will increase Seroquel for sleep-50-100 mg nightly for sleep- changed medicine to 50 mg tablet- and can take 50 mg  initially and then if not enough, then can increase 100 mg nightly- as the max dose.   4. Suggest seeing  Psychiatry for panic attacks- don't feel comfortable increasing Xanax while on pain meds -   5. Needs to get eyes checked- per pt- has been 4 years since last eye checked. Suggest [facility] Check to see if they can take your insurance.   6. F/U in 3 months- will con't to refill pain medications. - just got pain meds and Xanax refilled- not due to 47 month old.    I spent a total of 35 minutes on visit as detailed above.

## 2020-01-07 NOTE — Patient Instructions (Addendum)
1. Pt is a 64 yr old female with Sciatica and chronic low back pain/lumbar radiculopathy s/p 3 lumbar surgeries in 1990s.Also has severe anxiety- was on Xanax 0.5 mg prior- andwason O2 full time and insomniaand hypothyroidism with increased Synthroid.   1. On Midodrine, still having dizziness/lightheaded in spite of taking the medicine- and BP running really low- will refer to Cardiology since BP is SO low and almost passed out prior to appointment today.   2. Continue Midodrine for now until sees Cardiology. Can make BP a little high, but would rather it be a little high than low.   3. Will increase Seroquel for sleep-50-100 mg nightly for sleep- changed medicine to 50 mg tablet- and can take 50 mg initially and then if not enough, then can increase 100 mg nightly- as the max dose.   4. Suggest seeing  Psychiatry for panic attacks- don't feel comfortable increasing Xanax while on pain meds -   5. Needs to get eyes checked- per pt- has been 4 years since last eye checked. Suggest [facility] Check to see if they can take your insurance.   6. F/U in 3 months- will con't to refill pain medications.  Xanax and pain meds just refilled.

## 2020-01-17 ENCOUNTER — Other Ambulatory Visit: Payer: Self-pay | Admitting: Gastroenterology

## 2020-01-27 DIAGNOSIS — J441 Chronic obstructive pulmonary disease with (acute) exacerbation: Secondary | ICD-10-CM | POA: Diagnosis not present

## 2020-02-14 ENCOUNTER — Other Ambulatory Visit: Payer: Self-pay | Admitting: Physical Medicine and Rehabilitation

## 2020-03-01 ENCOUNTER — Encounter: Payer: Self-pay | Admitting: Cardiology

## 2020-03-01 ENCOUNTER — Ambulatory Visit: Payer: Medicare Other | Admitting: Cardiology

## 2020-03-01 ENCOUNTER — Other Ambulatory Visit: Payer: Self-pay

## 2020-03-01 VITALS — BP 124/76 | HR 94 | Ht 62.0 in | Wt 124.0 lb

## 2020-03-01 DIAGNOSIS — I779 Disorder of arteries and arterioles, unspecified: Secondary | ICD-10-CM | POA: Diagnosis not present

## 2020-03-01 DIAGNOSIS — I951 Orthostatic hypotension: Secondary | ICD-10-CM | POA: Diagnosis not present

## 2020-03-01 NOTE — Progress Notes (Signed)
Cardiology Office Note  Date: 03/01/2020   ID: Abbygail, [place] 11-12-1955, MRN [id number]  PCP:  [name], [name], MD  Cardiologist:  Rozann Lesches, MD Electrophysiologist:  None   Chief Complaint  Patient presents with  . Orthostatic dizziness    History of Present Illness: [name] is a 65 y.o. female referred for cardiology consultation by Dr. Dagoberto Ligas for evaluation of orthostatic hypotension.  We discussed her symptoms today.  She did not have any home blood pressure checks for review, states that intermittently her blood pressure is in the "90s."  She does have orthostatic dizziness at times, has not had syncope.  She has been on midodrine, ran out recently and needs a refill.  She has chronic pain, lower back/sciatica and status post previous lumbar surgeries.  I reviewed her medications.  Seroquel, oxycodone, and Cymbalta can all cause orthostasis and it is very likely that she has a component of autonomic dysfunction in the setting of treatment for chronic pain.  She also tells her that she does not drink fluids very much and is sometimes "dehydrated."  Record review finds previous evaluation by Dr. Bronson Ing back in 2015, history of noncardiac chest pain with normal coronary arteries at cardiac catheterization.  Orthostatic measurements were normal today.  I see that she also has a history of moderate carotid artery disease and left subclavian steal, has not undergone carotid Dopplers since 2016.  She is currently not on statin therapy, cannot recall intolerance to Lipitor.  Past Medical History:  Diagnosis Date  . Anemia   . Anxiety and depression   . Bronchitis   . Chronic pain   . COPD (chronic obstructive pulmonary disease) ([place])   . Gastroesophageal reflux disease   . History of cardiac catheterization    Normal coronary arteries 2015  . Hyperlipidemia   . Hypertension   . Hypothyroid   . Migraine headache   . Near syncope   . On  home oxygen therapy    Started 08/2018  . Peripheral vascular disease ([place])   . Pneumonia   . Tobacco abuse    1/2 pack per day  . Uterine cancer [facility])     Past Surgical History:  Procedure Laterality Date  . ABDOMINAL HYSTERECTOMY     without oophorectomy for neoplastic disease  . BACK SURGERY    . BIOPSY  10/22/2018   Procedure: BIOPSY;  Surgeon: Danie Binder, MD;  [facility];  Service: Endoscopy;;  duodenal, gastric  . CARDIAC CATHETERIZATION  07/2013   normal coronary arteries  . CARPAL TUNNEL RELEASE     Left  . CHOLECYSTECTOMY  2007  . DILATION AND CURETTAGE OF UTERUS    . ESOPHAGOGASTRODUODENOSCOPY (EGD) WITH PROPOFOL N/A 10/22/2018   gastritis (mild reactive gastropathy, negative duodenal biopsies for celiac).   Marland Kitchen LAPAROTOMY    . LEFT HEART CATHETERIZATION WITH CORONARY ANGIOGRAM N/A 07/12/2013   Procedure: LEFT HEART CATHETERIZATION WITH CORONARY ANGIOGRAM;  Surgeon: Leonie Man, MD;  Location: [facility] CATH LAB;  Service: Cardiovascular;  Laterality: N/A;  . LUMBAR LAMINECTOMY  x3   Left; complicated by neurologic dysfunction  . NASAL SINUS SURGERY      Current Outpatient Medications  Medication Sig Dispense Refill  . albuterol (PROAIR HFA) 108 (90 Base) MCG/ACT inhaler Inhale 2 puffs into the lungs every 4 (four) hours as needed for wheezing or shortness of breath (cough). 18 g 11  . albuterol (PROVENTIL) (2.5 MG/3ML) 0.083% nebulizer solution Take  3 mLs (2.5 mg total) by nebulization every 4 (four) hours as needed for wheezing or shortness of breath (cough). 75 mL 12  . ALPRAZolam (XANAX) 0.5 MG tablet Take 1 tablet (0.5 mg total) by mouth 2 (two) times daily as needed. for anxiety 60 tablet 3  . aspirin EC 81 MG tablet Take 81 mg by mouth daily.    . DULoxetine (CYMBALTA) 60 MG capsule TAKE ONE CAPSULE BY MOUTH 2 TIMES A DAY. 120 capsule 0  . fluticasone (FLONASE) 50 MCG/ACT nasal spray Place 1-2 sprays into both nostrils daily as needed for allergies.      . Fluticasone-Umeclidin-Vilant (TRELEGY ELLIPTA) 100-62.5-25 MCG/INH AEPB Take one click 1st thing each am 1 each 11  . midodrine (PROAMATINE) 5 MG tablet Take 1 tablet (5 mg total) by mouth 3 (three) times daily with meals. 90 tablet 3  . mirtazapine (REMERON) 15 MG tablet TAKE (1) TABLET BY MOUTH AT BEDTIME. 30 tablet 0  . OVER THE COUNTER MEDICATION Take 1 capsule by mouth daily. Amberen Multi-Symptom Menopause Relief    . oxyCODONE (OXY IR/ROXICODONE) 5 MG immediate release tablet TAKE (1) TABLET BY MOUTH EVERY SIX HOURS AS NEEDED FOR SEVERE PAIN. 120 tablet 0  . pantoprazole (PROTONIX) 40 MG tablet TAKE ONE TABLET BY MOUTH TWICE DAILY BEFORE A MEAL. 180 tablet 2  . pregabalin (LYRICA) 50 MG capsule TAKE 1 CAPSULE BY MOUTH THREE TIMES DAILY. 90 capsule 0  . QUEtiapine (SEROQUEL) 50 MG tablet Take 1-2 tablets (50-100 mg total) by mouth at bedtime. 60 tablet 3  . UNABLE TO FIND Med Name: OTC Amberen (for hot flashes)     No current facility-administered medications for this visit.   Allergies:  Mirtazapine, Acetaminophen-codeine, Aspirin, Astelin [azelastine], Bactrim [sulfamethoxazole-trimethoprim], Celecoxib, Ciprofloxacin, Fentanyl, Levaquin [levofloxacin in d5w], Lipitor [atorvastatin], Neomycin, Salicylates, Sulfa antibiotics, Adhesive [tape], Latex, and Tegretol [carbamazepine]   Social History: The patient  reports that she has been smoking cigarettes. She started smoking about 47 years ago. She has a 10.00 pack-year smoking history. She has never used smokeless tobacco. She reports that she does not drink alcohol and does not use drugs.   Family History: The patient's family history includes Bleeding Disorder in her mother; Colon polyps in her mother; Deep vein thrombosis in her mother; Heart disease in her brother and mother; Hypertension in her brother, mother, and sister.   ROS: No frank syncope.  No recurrent chest pain.  Physical Exam: VS:  BP 124/76   Pulse 94   Ht 5\' 2"   (1.575 m)   Wt 124 lb (56.2 kg)   SpO2 91%   BMI 22.68 kg/m , BMI Body mass index is 22.68 kg/m.  Wt Readings from Last 3 Encounters:  03/01/20 124 lb (56.2 kg)  12/06/19 124 lb (56.2 kg)  10/08/19 124 lb (56.2 kg)    General: Chronically ill-appearing woman, wearing supplemental oxygen via nasal cannula. HEENT: Conjunctiva and lids normal, wearing a mask. Neck: Supple, no elevated JVP, right carotid bruit, no thyromegaly. Lungs: Decreased breath sounds without wheezing, nonlabored breathing at rest. Cardiac: Regular rate and rhythm, no S3, soft systolic murmur, no pericardial rub. Abdomen: Soft, nontender, bowel sounds present . Extremities: No pitting edema, distal pulses 2+. Skin: Warm and dry. Musculoskeletal: No kyphosis. Neuropsychiatric: Alert and oriented x3, affect grossly appropriate.  ECG:  An ECG dated 06/18/2019 was personally reviewed today and demonstrated:  Sinus rhythm with PACs, left bundle branch block.  Recent Labwork: 05/31/2019: TSH 3.220 06/11/2019: ALT 9; AST  13; B Natriuretic Peptide 492.4; BUN 13; Creatinine, Ser 1.35; Hemoglobin 11.9; Platelets 195; Potassium 2.8; Sodium 139     Component Value Date/Time   CHOL 254 (H) 06/19/2010 1230   TRIG 142 06/19/2010 1230   HDL 66 06/19/2010 1230   CHOLHDL 3.8 06/19/2010 1230   VLDL 28 06/19/2010 1230   LDLCALC 160 (H) 06/19/2010 1230    Other Studies Reviewed Today:  Echocardiogram 08/25/2018: 1. The left ventricle has low normal systolic function, with an ejection  fraction of 50-55%. The cavity size was normal. There is moderately  increased left ventricular wall thickness. Left ventricular diastolic  Doppler parameters are consistent with  impaired relaxation. Elevated mean left atrial pressure.  2. The right ventricle has normal systolic function. The cavity was  normal. There is no increase in right ventricular wall thickness.  3. Left atrial size was mildly dilated.  4. Right atrial size was  mildly dilated.  5. No evidence of mitral valve stenosis.  6. No stenosis of the aortic valve.  7. The aortic root is normal in size and structure.  8. Pulmonary hypertension is indeterminant, inadequate TR jet.   Assessment and Plan:  1.  History of orthostatic hypotension and intermittent dizziness, no frank syncope.  Actually, orthostatic measurements were normal today.  She has been on midodrine as noted above and most likely has autonomic dysfunction with propensity for orthostasis on her current medications which include Seroquel, oxycodone, and Cymbalta.  For now would continue midodrine at 5 mg 3 times daily, I encouraged her to improve her hydration as this will also be helpful.  2.  History of moderate carotid artery disease and left subclavian steal. She has not had follow-up carotid Doppler since 2016 and these will be arranged.  Currently not on statin therapy, cannot recall prior intolerance to Lipitor.  I recommended that she follow-up with her PCP for a lipid panel, consider could consider initiation of Crestor.  3.  History of normal coronary arteries at cardiac catheterization in 2015.  Medication Adjustments/Labs and Tests Ordered: Current medicines are reviewed at length with the patient today.  Concerns regarding medicines are outlined above.   Tests Ordered: Orders Placed This Encounter  Procedures  . US Carotid Duplex Bilateral    Medication Changes: No orders of the defined types were placed in this encounter.   Disposition:  Follow up 6 months in the [place] office.  Signed, Satira Sark, MD, [facility] 03/01/2020 11:25 AM    [facility] [address] [place], [place] [address] Phone: [phone]; Fax: [phone]

## 2020-03-01 NOTE — Patient Instructions (Signed)
Medication Instructions:  Your physician recommends that you continue on your current medications as directed. Please refer to the Current Medication list given to you today.  Increase Liquid intake   Start taking Midodrine   *If you need a refill on your cardiac medications before your next appointment, please call your pharmacy*   Lab Work: NONE   If you have labs (blood work) drawn today and your tests are completely normal, you will receive your results only by: Marland Kitchen MyChart Message (if you have MyChart) OR . A paper copy in the mail If you have any lab test that is abnormal or we need to change your treatment, we will call you to review the results.   Testing/Procedures: Your physician has requested that you have a carotid duplex. This test is an ultrasound of the carotid arteries in your neck. It looks at blood flow through these arteries that supply the brain with blood. Allow one hour for this exam. There are no restrictions or special instructions.    Follow-Up: At [facility], you and your health needs are our priority.  As part of our continuing mission to provide you with exceptional heart care, we have created designated Provider Care Teams.  These Care Teams include your primary Cardiologist (physician) and Advanced Practice Providers (APPs -  Physician Assistants and Nurse Practitioners) who all work together to provide you with the care you need, when you need it.  We recommend signing up for the patient portal called "MyChart".  Sign up information is provided on this After Visit Summary.  MyChart is used to connect with patients for Virtual Visits (Telemedicine).  Patients are able to view lab/test results, encounter notes, upcoming appointments, etc.  Non-urgent messages can be sent to your provider as well.   To learn more about what you can do with MyChart, go to [email or web address].    Your next appointment:   6 month(s)  The format for your next  appointment:   In Person  Provider:   Rozann Lesches, MD   Other Instructions Thank you for choosing [facility]!

## 2020-03-10 ENCOUNTER — Other Ambulatory Visit: Payer: Self-pay

## 2020-03-10 ENCOUNTER — Ambulatory Visit (HOSPITAL_COMMUNITY)
Admission: RE | Admit: 2020-03-10 | Discharge: 2020-03-10 | Disposition: A | Discharge status: Home / Self Care | Payer: Medicare Other | Source: Ambulatory Visit | Attending: Cardiology | Admitting: Cardiology

## 2020-03-10 DIAGNOSIS — I779 Disorder of arteries and arterioles, unspecified: Secondary | ICD-10-CM | POA: Diagnosis present

## 2020-03-16 ENCOUNTER — Other Ambulatory Visit: Payer: Self-pay | Admitting: Physical Medicine and Rehabilitation

## 2020-03-20 ENCOUNTER — Telehealth: Payer: Self-pay | Admitting: *Deleted

## 2020-03-20 MED ORDER — OXYCODONE HCL 5 MG PO TABS: ORAL_TABLET | ORAL | 0 refills | Status: DC

## 2020-03-20 MED ORDER — ALPRAZOLAM 0.5 MG PO TABS
0.5000 mg | ORAL_TABLET | Freq: Two times a day (BID) | ORAL | 5 refills | Status: DC | PRN
Start: 2020-03-20 — End: 2020-09-11

## 2020-03-20 NOTE — Telephone Encounter (Signed)
[name] called for a refill on her oxycodone (filled 02/14/20) and alprazolam. Her next appt is 04/07/20.

## 2020-03-21 NOTE — Telephone Encounter (Signed)
Notified by VM.

## 2020-04-07 ENCOUNTER — Encounter: Payer: Self-pay | Admitting: Physical Medicine and Rehabilitation

## 2020-04-07 ENCOUNTER — Encounter
Payer: Medicare Other | Attending: Physical Medicine and Rehabilitation | Admitting: Physical Medicine and Rehabilitation

## 2020-04-07 ENCOUNTER — Other Ambulatory Visit: Payer: Self-pay

## 2020-04-07 VITALS — BP 124/85 | HR 91 | Temp 97.8°F | Wt 124.2 lb

## 2020-04-07 DIAGNOSIS — Z79891 Long term (current) use of opiate analgesic: Secondary | ICD-10-CM | POA: Diagnosis present

## 2020-04-07 DIAGNOSIS — Z5181 Encounter for therapeutic drug level monitoring: Secondary | ICD-10-CM | POA: Insufficient documentation

## 2020-04-07 DIAGNOSIS — I950 Idiopathic hypotension: Secondary | ICD-10-CM | POA: Insufficient documentation

## 2020-04-07 DIAGNOSIS — F419 Anxiety disorder, unspecified: Secondary | ICD-10-CM | POA: Diagnosis present

## 2020-04-07 DIAGNOSIS — F32A Depression, unspecified: Secondary | ICD-10-CM | POA: Diagnosis present

## 2020-04-07 DIAGNOSIS — G894 Chronic pain syndrome: Secondary | ICD-10-CM | POA: Diagnosis present

## 2020-04-07 DIAGNOSIS — J9612 Chronic respiratory failure with hypercapnia: Secondary | ICD-10-CM | POA: Diagnosis present

## 2020-04-07 DIAGNOSIS — J9611 Chronic respiratory failure with hypoxia: Secondary | ICD-10-CM | POA: Insufficient documentation

## 2020-04-07 DIAGNOSIS — E039 Hypothyroidism, unspecified: Secondary | ICD-10-CM | POA: Diagnosis present

## 2020-04-07 MED ORDER — OXYCODONE HCL 5 MG PO TABS: ORAL_TABLET | ORAL | 0 refills | Status: DC

## 2020-04-07 MED ORDER — LEVOTHYROXINE SODIUM 50 MCG PO TABS: 50.0000 ug | ORAL_TABLET | Freq: Every day | ORAL | 5 refills | Status: DC

## 2020-04-07 MED ORDER — DULOXETINE HCL 60 MG PO CPEP: 60.0000 mg | ORAL_CAPSULE | Freq: Two times a day (BID) | ORAL | 5 refills | Status: DC

## 2020-04-07 MED ORDER — PREGABALIN 50 MG PO CAPS: 50.0000 mg | ORAL_CAPSULE | Freq: Three times a day (TID) | ORAL | 5 refills | Status: DC

## 2020-04-07 MED ORDER — MIDODRINE HCL 5 MG PO TABS: 5.0000 mg | ORAL_TABLET | Freq: Three times a day (TID) | ORAL | 5 refills | Status: DC

## 2020-04-07 NOTE — Progress Notes (Signed)
Subjective:    Patient ID: [name], female    DOB: 03/20/1955, 65 y.o.   MRN: [id number]  HPI  1. Pt is a 65 yr old female with Sciatica and chronic low back pain/lumbar radiculopathy s/p 3 lumbar surgeries in 1990s.Also has severe anxiety- was on Xanax 0.5 mg prior- andwason O2 full time and insomniaand hypothyroidism with increased Synthroid.   Has been taking Midodrine- 3x/day- was continued by Cards.   Last got refill 2/15- so due soon for oxycodone.   L eye and R eye running/watering- but L eye has "creamy drainage".   Seroquel for sleep helpful -  Takes 1 pill of each at bedtime, so sounds like 50 mg nightly.   Albuterol is $127 monthly after insurance.     Pain Inventory Average Pain 8 Pain Right Now 5 My pain is intermittent, constant, sharp and burning  In the last 24 hours, has pain interfered with the following? General activity 10 Relation with others 0 Enjoyment of life 8 What TIME of day is your pain at its worst? morning  Sleep (in general) Fair  Pain is worse with: bending, sitting, standing and some activites Pain improves with: medication Relief from Meds: 8  Family History  Problem Relation Age of Onset  . Colon polyps Mother   . Deep vein thrombosis Mother   . Heart disease Mother   . Hypertension Mother   . Bleeding Disorder Mother   . Hypertension Sister   . Heart disease Brother        before age 29  . Hypertension Brother    Social History   Socioeconomic History  . Marital status: Married    Spouse name: Not on file  . Number of children: 2  . Years of education: Not on file  . Highest education level: Not on file  Occupational History    Comment: Disabled  Tobacco Use  . Smoking status: Current Every Day Smoker    Packs/day: 0.25    Years: 40.00    Pack years: 10.00    Types: Cigarettes    Start date: 09/06/1972  . Smokeless tobacco: Never Used  . Tobacco comment: 2 cigarettes per day-12/06/2019  Vaping Use   . Vaping Use: Never used  Substance and Sexual Activity  . Alcohol use: No    Alcohol/week: 0.0 standard drinks  . Drug use: No  . Sexual activity: Not Currently    Birth control/protection: Surgical  Other Topics Concern  . Not on file  Social History Narrative  . Not on file   Social Determinants of Health   Financial Resource Strain: Not on file  Food Insecurity: Not on file  Transportation Needs: Not on file  Physical Activity: Not on file  Stress: Not on file  Social Connections: Not on file   Past Surgical History:  Procedure Laterality Date  . ABDOMINAL HYSTERECTOMY     without oophorectomy for neoplastic disease  . BACK SURGERY    . BIOPSY  10/22/2018   Procedure: BIOPSY;  Surgeon: Danie Binder, MD;  Location: [facility];  Service: Endoscopy;;  duodenal, gastric  . CARDIAC CATHETERIZATION  07/2013   normal coronary arteries  . CARPAL TUNNEL RELEASE     Left  . CHOLECYSTECTOMY  2007  . DILATION AND CURETTAGE OF UTERUS    . ESOPHAGOGASTRODUODENOSCOPY (EGD) WITH PROPOFOL N/A 10/22/2018   gastritis (mild reactive gastropathy, negative duodenal biopsies for celiac).   Marland Kitchen LAPAROTOMY    . LEFT HEART  CATHETERIZATION WITH CORONARY ANGIOGRAM N/A 07/12/2013   Procedure: LEFT HEART CATHETERIZATION WITH CORONARY ANGIOGRAM;  Surgeon: Leonie Man, MD;  Location: [facility] CATH LAB;  Service: Cardiovascular;  Laterality: N/A;  . LUMBAR LAMINECTOMY  x3   Left; complicated by neurologic dysfunction  . NASAL SINUS SURGERY     Past Surgical History:  Procedure Laterality Date  . ABDOMINAL HYSTERECTOMY     without oophorectomy for neoplastic disease  . BACK SURGERY    . BIOPSY  10/22/2018   Procedure: BIOPSY;  Surgeon: Danie Binder, MD;  Location: [facility];  Service: Endoscopy;;  duodenal, gastric  . CARDIAC CATHETERIZATION  07/2013   normal coronary arteries  . CARPAL TUNNEL RELEASE     Left  . CHOLECYSTECTOMY  2007  . DILATION AND CURETTAGE OF UTERUS    .  ESOPHAGOGASTRODUODENOSCOPY (EGD) WITH PROPOFOL N/A 10/22/2018   gastritis (mild reactive gastropathy, negative duodenal biopsies for celiac).   Marland Kitchen LAPAROTOMY    . LEFT HEART CATHETERIZATION WITH CORONARY ANGIOGRAM N/A 07/12/2013   Procedure: LEFT HEART CATHETERIZATION WITH CORONARY ANGIOGRAM;  Surgeon: Leonie Man, MD;  Location: [facility] CATH LAB;  Service: Cardiovascular;  Laterality: N/A;  . LUMBAR LAMINECTOMY  x3   Left; complicated by neurologic dysfunction  . NASAL SINUS SURGERY     Past Medical History:  Diagnosis Date  . Anemia   . Anxiety and depression   . Bronchitis   . Chronic pain   . COPD (chronic obstructive pulmonary disease) ([place])   . Gastroesophageal reflux disease   . History of cardiac catheterization    Normal coronary arteries 2015  . Hyperlipidemia   . Hypertension   . Hypothyroid   . Migraine headache   . Near syncope   . On home oxygen therapy    Started 08/2018  . Peripheral vascular disease ([place])   . Pneumonia   . Tobacco abuse    1/2 pack per day  . Uterine cancer ([facility])    BP 124/85   Pulse 91   Temp 97.8 F (36.6 C)   Wt 124 lb 3.2 oz (56.3 kg)   BMI 22.72 kg/m   Opioid Risk Score:   Fall Risk Score:  `1  Depression screen PHQ 2/9  Depression screen PHQ 2/9 09/17/2018  Decreased Interest 0  Down, Depressed, Hopeless 0  PHQ - 2 Score 0  Altered sleeping 0  Tired, decreased energy 0  Change in appetite 1  Feeling bad or failure about yourself  0  Trouble concentrating 0  Moving slowly or fidgety/restless 0  Suicidal thoughts 0  PHQ-9 Score 1  Difficult doing work/chores Not difficult at all   Review of Systems  Musculoskeletal: Positive for back pain and gait problem.  Neurological: Positive for headaches.  All other systems reviewed and are negative.      Objective:   Physical Exam  Awake, alert, appropriate, on O2 2L, NAD Coarse BS, audible; on 2L O2 Loss of lateral eyebrows B/L Tremor of UEs' with intention.        Assessment & Plan:   1. Pt is a 65 yr old female with Sciatica and chronic low back pain/lumbar radiculopathy s/p 3 lumbar surgeries in 1990s.Also has severe anxiety- was on Xanax 0.5 mg prior- andwason O2 full time and insomniaand hypothyroidism with increased Synthroid.   1. Oxycodone- 5 mg q6 hours prn #120 Refill- is due 3/14  2. Will restart Synthroid 50 mcg daily. And then in 3 months, recheck TSH at that  time.   3. Sent in refills of Midodrine - BP medicine to bring BP up- 6 months.   4. Con't Xanax for now- says still trying to find someone for depression/anxiety- but hasn't had any luck-  Won't increase due to pain meds.   5. Con't Cymbalta 120 mg daily- refilled with 6 months refills; and Lyrica 50 mg  3x/day- actually take 1 tab in Am and 2 tabs at night- for nerve pain- 6 months refills.   6. Doesn't think she's taking remeron, of note.   7. F/U in 3 months.   I spent a total of 30 minutes on visit- as detailed above.

## 2020-04-07 NOTE — Patient Instructions (Signed)
1. Pt is a 65 yr old female with Sciatica and chronic low back pain/lumbar radiculopathy s/p 3 lumbar surgeries in 1990s.Also has severe anxiety- was on Xanax 0.5 mg prior- andwason O2 full time and insomniaand hypothyroidism with increased Synthroid.   1. Oxycodone- 5 mg q6 hours prn #120 Refill- is due 3/14  2. Will restart Synthroid/Levothyroxine 50 mcg daily. And then in 3 months, recheck TSH at that time. If can't wake up early to take on its own on empty stomach, can take at night on empty stomach.   3. Sent in refills of Midodrine - BP medicine to bring BP up- 6 months.   4. Con't Xanax for now- says still trying to find someone for depression/anxiety- but hasn't had any luck-  Won't increase due to pain meds.   5. Con't Cymbalta 120 mg daily- refilled with 6 months refills; and Lyrica 50 mg  3x/day- actually take 1 tab in Am and 2 tabs at night- for nerve pain- 6 months refills.   6. Doesn't think she's taking remeron, of note.   7. F/U in 3 months.

## 2020-04-12 LAB — DRUG TOX MONITOR 1 W/CONF, ORAL FLD
Alprazolam: 6.61 ng/mL — ABNORMAL HIGH (ref <0.50)
Amphetamines: NEGATIVE ng/mL (ref <10)
Barbiturates: NEGATIVE ng/mL (ref <10)
Benzodiazepines: POSITIVE ng/mL — AB (ref <0.50)
Buprenorphine: NEGATIVE ng/mL (ref <0.10)
Chlordiazepoxide: NEGATIVE ng/mL (ref <0.50)
Clonazepam: NEGATIVE ng/mL (ref <0.50)
Cocaine: NEGATIVE ng/mL (ref <5.0)
Codeine: NEGATIVE ng/mL (ref <2.5)
Cotinine: >250 ng/mL — ABNORMAL HIGH (ref <5.0)
Diazepam: NEGATIVE ng/mL (ref <0.50)
Dihydrocodeine: NEGATIVE ng/mL (ref <2.5)
Fentanyl: NEGATIVE ng/mL (ref <0.10)
Flunitrazepam: NEGATIVE ng/mL (ref <0.50)
Flurazepam: NEGATIVE ng/mL (ref <0.50)
Heroin Metabolite: NEGATIVE ng/mL (ref <1.0)
Hydrocodone: NEGATIVE ng/mL (ref <2.5)
Hydromorphone: NEGATIVE ng/mL (ref <2.5)
Lorazepam: NEGATIVE ng/mL (ref <0.50)
MARIJUANA: NEGATIVE ng/mL (ref <2.5)
MDMA: NEGATIVE ng/mL (ref <10)
Meprobamate: NEGATIVE ng/mL (ref <2.5)
Methadone: NEGATIVE ng/mL (ref <5.0)
Midazolam: NEGATIVE ng/mL (ref <0.50)
Morphine: NEGATIVE ng/mL (ref <2.5)
Nicotine Metabolite: POSITIVE ng/mL — AB (ref <5.0)
Nordiazepam: NEGATIVE ng/mL (ref <0.50)
Norhydrocodone: NEGATIVE ng/mL (ref <2.5)
Noroxycodone: 152.1 ng/mL — ABNORMAL HIGH (ref <2.5)
Opiates: POSITIVE ng/mL — AB (ref <2.5)
Oxazepam: NEGATIVE ng/mL (ref <0.50)
Oxycodone: >250 ng/mL — ABNORMAL HIGH (ref <2.5)
Oxymorphone: 4.5 ng/mL — ABNORMAL HIGH (ref <2.5)
Phencyclidine: NEGATIVE ng/mL (ref <10)
Tapentadol: NEGATIVE ng/mL (ref <5.0)
Temazepam: NEGATIVE ng/mL (ref <0.50)
Tramadol: NEGATIVE ng/mL (ref <5.0)
Triazolam: NEGATIVE ng/mL (ref <0.50)
Zolpidem: NEGATIVE ng/mL (ref <5.0)

## 2020-04-12 LAB — DRUG TOX ALC METAB W/CON, ORAL FLD: Alcohol Metabolite: NEGATIVE ng/mL (ref <25)

## 2020-04-13 ENCOUNTER — Telehealth: Payer: Self-pay | Admitting: *Deleted

## 2020-04-13 NOTE — Telephone Encounter (Signed)
Oral swab drug screen was consistent for prescribed medications.  ?

## 2020-04-18 ENCOUNTER — Other Ambulatory Visit: Payer: Self-pay | Admitting: Physical Medicine and Rehabilitation

## 2020-06-09 ENCOUNTER — Telehealth: Payer: Self-pay | Admitting: *Deleted

## 2020-06-09 MED ORDER — OXYCODONE HCL 5 MG PO TABS
ORAL_TABLET | ORAL | 0 refills | Status: DC
Start: 2020-06-09 — End: 2020-07-18

## 2020-06-09 NOTE — Telephone Encounter (Signed)
Notified. 

## 2020-06-09 NOTE — Telephone Encounter (Signed)
[name] is asking for refill on her oxycodone.  Last fill date was 04/17/20 and next appt is 07/07/20.

## 2020-06-09 NOTE — Telephone Encounter (Signed)
Refilled pain meds today oxy 5 mg q6 hours prn- #120

## 2020-07-07 ENCOUNTER — Encounter
Payer: Medicare Other | Attending: Physical Medicine and Rehabilitation | Admitting: Physical Medicine and Rehabilitation

## 2020-07-07 DIAGNOSIS — J9612 Chronic respiratory failure with hypercapnia: Secondary | ICD-10-CM | POA: Insufficient documentation

## 2020-07-07 DIAGNOSIS — G894 Chronic pain syndrome: Secondary | ICD-10-CM | POA: Insufficient documentation

## 2020-07-07 DIAGNOSIS — J9611 Chronic respiratory failure with hypoxia: Secondary | ICD-10-CM | POA: Insufficient documentation

## 2020-07-07 DIAGNOSIS — E039 Hypothyroidism, unspecified: Secondary | ICD-10-CM | POA: Insufficient documentation

## 2020-07-07 DIAGNOSIS — Z5181 Encounter for therapeutic drug level monitoring: Secondary | ICD-10-CM | POA: Insufficient documentation

## 2020-07-07 DIAGNOSIS — F32A Depression, unspecified: Secondary | ICD-10-CM | POA: Insufficient documentation

## 2020-07-07 DIAGNOSIS — I950 Idiopathic hypotension: Secondary | ICD-10-CM | POA: Insufficient documentation

## 2020-07-07 DIAGNOSIS — Z79891 Long term (current) use of opiate analgesic: Secondary | ICD-10-CM | POA: Insufficient documentation

## 2020-07-07 DIAGNOSIS — F419 Anxiety disorder, unspecified: Secondary | ICD-10-CM | POA: Insufficient documentation

## 2020-07-17 ENCOUNTER — Telehealth: Payer: Self-pay

## 2020-07-17 NOTE — Telephone Encounter (Signed)
Patient called requesting refill on Oxycodone, last filled #120 on 06/09/20, next appt 07/26/20

## 2020-07-18 MED ORDER — OXYCODONE HCL 5 MG PO TABS: ORAL_TABLET | ORAL | 0 refills | Status: DC

## 2020-07-26 ENCOUNTER — Encounter: Payer: Medicare Other | Admitting: Physical Medicine and Rehabilitation

## 2020-07-26 ENCOUNTER — Emergency Department (HOSPITAL_COMMUNITY): Payer: Medicare Other

## 2020-07-26 ENCOUNTER — Other Ambulatory Visit: Payer: Self-pay

## 2020-07-26 ENCOUNTER — Encounter: Payer: Self-pay | Admitting: Physical Medicine and Rehabilitation

## 2020-07-26 ENCOUNTER — Inpatient Hospital Stay (HOSPITAL_COMMUNITY)
Admission: EM | Admit: 2020-07-26 | Discharge: 2020-08-01 | DRG: 291 | Disposition: A | Discharge status: Home Health | Payer: Medicare Other | Source: Ambulatory Visit | Attending: Internal Medicine | Admitting: Internal Medicine

## 2020-07-26 ENCOUNTER — Other Ambulatory Visit (HOSPITAL_COMMUNITY): Payer: Medicare Other

## 2020-07-26 ENCOUNTER — Encounter (HOSPITAL_COMMUNITY): Payer: Self-pay | Admitting: Emergency Medicine

## 2020-07-26 DIAGNOSIS — R0689 Other abnormalities of breathing: Secondary | ICD-10-CM

## 2020-07-26 DIAGNOSIS — I5043 Acute on chronic combined systolic (congestive) and diastolic (congestive) heart failure: Secondary | ICD-10-CM | POA: Diagnosis present

## 2020-07-26 DIAGNOSIS — J441 Chronic obstructive pulmonary disease with (acute) exacerbation: Secondary | ICD-10-CM | POA: Diagnosis present

## 2020-07-26 DIAGNOSIS — J9811 Atelectasis: Secondary | ICD-10-CM | POA: Diagnosis present

## 2020-07-26 DIAGNOSIS — R778 Other specified abnormalities of plasma proteins: Secondary | ICD-10-CM | POA: Diagnosis not present

## 2020-07-26 DIAGNOSIS — G8929 Other chronic pain: Secondary | ICD-10-CM | POA: Diagnosis present

## 2020-07-26 DIAGNOSIS — F419 Anxiety disorder, unspecified: Secondary | ICD-10-CM | POA: Diagnosis present

## 2020-07-26 DIAGNOSIS — J449 Chronic obstructive pulmonary disease, unspecified: Secondary | ICD-10-CM | POA: Diagnosis present

## 2020-07-26 DIAGNOSIS — I110 Hypertensive heart disease with heart failure: Secondary | ICD-10-CM | POA: Diagnosis present

## 2020-07-26 DIAGNOSIS — F17210 Nicotine dependence, cigarettes, uncomplicated: Secondary | ICD-10-CM | POA: Diagnosis present

## 2020-07-26 DIAGNOSIS — G894 Chronic pain syndrome: Secondary | ICD-10-CM | POA: Diagnosis present

## 2020-07-26 DIAGNOSIS — Z20822 Contact with and (suspected) exposure to covid-19: Secondary | ICD-10-CM | POA: Diagnosis present

## 2020-07-26 DIAGNOSIS — I739 Peripheral vascular disease, unspecified: Secondary | ICD-10-CM | POA: Diagnosis present

## 2020-07-26 DIAGNOSIS — J9621 Acute and chronic respiratory failure with hypoxia: Secondary | ICD-10-CM | POA: Diagnosis present

## 2020-07-26 DIAGNOSIS — Z8249 Family history of ischemic heart disease and other diseases of the circulatory system: Secondary | ICD-10-CM

## 2020-07-26 DIAGNOSIS — Z9119 Patient's noncompliance with other medical treatment and regimen: Secondary | ICD-10-CM

## 2020-07-26 DIAGNOSIS — I950 Idiopathic hypotension: Secondary | ICD-10-CM | POA: Diagnosis present

## 2020-07-26 DIAGNOSIS — E785 Hyperlipidemia, unspecified: Secondary | ICD-10-CM | POA: Diagnosis present

## 2020-07-26 DIAGNOSIS — F32A Depression, unspecified: Secondary | ICD-10-CM | POA: Diagnosis present

## 2020-07-26 DIAGNOSIS — Z832 Family history of diseases of the blood and blood-forming organs and certain disorders involving the immune mechanism: Secondary | ICD-10-CM | POA: Diagnosis not present

## 2020-07-26 DIAGNOSIS — J9622 Acute and chronic respiratory failure with hypercapnia: Secondary | ICD-10-CM | POA: Diagnosis present

## 2020-07-26 DIAGNOSIS — E039 Hypothyroidism, unspecified: Secondary | ICD-10-CM | POA: Diagnosis present

## 2020-07-26 DIAGNOSIS — Z79899 Other long term (current) drug therapy: Secondary | ICD-10-CM | POA: Diagnosis not present

## 2020-07-26 DIAGNOSIS — Z515 Encounter for palliative care: Secondary | ICD-10-CM

## 2020-07-26 DIAGNOSIS — Z7189 Other specified counseling: Secondary | ICD-10-CM | POA: Diagnosis not present

## 2020-07-26 DIAGNOSIS — Z9981 Dependence on supplemental oxygen: Secondary | ICD-10-CM | POA: Diagnosis not present

## 2020-07-26 DIAGNOSIS — I509 Heart failure, unspecified: Secondary | ICD-10-CM

## 2020-07-26 DIAGNOSIS — N179 Acute kidney failure, unspecified: Secondary | ICD-10-CM | POA: Diagnosis present

## 2020-07-26 DIAGNOSIS — I252 Old myocardial infarction: Secondary | ICD-10-CM | POA: Diagnosis not present

## 2020-07-26 DIAGNOSIS — J9600 Acute respiratory failure, unspecified whether with hypoxia or hypercapnia: Secondary | ICD-10-CM

## 2020-07-26 DIAGNOSIS — R7989 Other specified abnormal findings of blood chemistry: Secondary | ICD-10-CM

## 2020-07-26 DIAGNOSIS — E7800 Pure hypercholesterolemia, unspecified: Secondary | ICD-10-CM | POA: Diagnosis not present

## 2020-07-26 LAB — CBC WITH DIFFERENTIAL/PLATELET
Abs Immature Granulocytes: 0.1 10*3/uL — ABNORMAL HIGH (ref 0.00–0.07)
Basophils Absolute: 0.1 10*3/uL (ref 0.0–0.1)
Basophils Relative: 1 %
Eosinophils Absolute: 0 10*3/uL (ref 0.0–0.5)
Eosinophils Relative: 0 %
HCT: 45 % (ref 36.0–46.0)
Hemoglobin: 12.6 g/dL (ref 12.0–15.0)
Immature Granulocytes: 1 %
Lymphocytes Relative: 11 %
Lymphs Abs: 1.2 10*3/uL (ref 0.7–4.0)
MCH: 23.8 pg — ABNORMAL LOW (ref 26.0–34.0)
MCHC: 28 g/dL — ABNORMAL LOW (ref 30.0–36.0)
MCV: 85.1 fL (ref 80.0–100.0)
Monocytes Absolute: 1.3 10*3/uL — ABNORMAL HIGH (ref 0.1–1.0)
Monocytes Relative: 12 %
Neutro Abs: 8.1 10*3/uL — ABNORMAL HIGH (ref 1.7–7.7)
Neutrophils Relative %: 75 %
Platelets: 355 10*3/uL (ref 150–400)
RBC: 5.29 MIL/uL — ABNORMAL HIGH (ref 3.87–5.11)
RDW: 20.9 % — ABNORMAL HIGH (ref 11.5–15.5)
WBC: 10.7 10*3/uL — ABNORMAL HIGH (ref 4.0–10.5)
nRBC: 0.3 % — ABNORMAL HIGH (ref 0.0–0.2)

## 2020-07-26 LAB — I-STAT VENOUS BLOOD GAS, ED
Acid-Base Excess: 4 mmol/L — ABNORMAL HIGH (ref 0.0–2.0)
Bicarbonate: 33.3 mmol/L — ABNORMAL HIGH (ref 20.0–28.0)
Calcium, Ion: 1.13 mmol/L — ABNORMAL LOW (ref 1.15–1.40)
HCT: 46 % (ref 36.0–46.0)
Hemoglobin: 15.6 g/dL — ABNORMAL HIGH (ref 12.0–15.0)
O2 Saturation: 92 %
Potassium: 4 mmol/L (ref 3.5–5.1)
Sodium: 135 mmol/L (ref 135–145)
TCO2: 35 mmol/L — ABNORMAL HIGH (ref 22–32)
pCO2, Ven: 72 mmHg (ref 44.0–60.0)
pH, Ven: 7.273 (ref 7.250–7.430)
pO2, Ven: 75 mmHg — ABNORMAL HIGH (ref 32.0–45.0)

## 2020-07-26 LAB — COMPREHENSIVE METABOLIC PANEL
ALT: 30 U/L (ref 0–44)
AST: 39 U/L (ref 15–41)
Albumin: 3.3 g/dL — ABNORMAL LOW (ref 3.5–5.0)
Alkaline Phosphatase: 135 U/L — ABNORMAL HIGH (ref 38–126)
Anion gap: 14 (ref 5–15)
BUN: 44 mg/dL — ABNORMAL HIGH (ref 8–23)
CO2: 30 mmol/L (ref 22–32)
Calcium: 9 mg/dL (ref 8.9–10.3)
Chloride: 93 mmol/L — ABNORMAL LOW (ref 98–111)
Creatinine, Ser: 2.9 mg/dL — ABNORMAL HIGH (ref 0.44–1.00)
GFR, Estimated: 18 mL/min — ABNORMAL LOW (ref >60)
Glucose, Bld: 107 mg/dL — ABNORMAL HIGH (ref 70–99)
Potassium: 4 mmol/L (ref 3.5–5.1)
Sodium: 137 mmol/L (ref 135–145)
Total Bilirubin: 2.1 mg/dL — ABNORMAL HIGH (ref 0.3–1.2)
Total Protein: 6.5 g/dL (ref 6.5–8.1)

## 2020-07-26 LAB — RESP PANEL BY RT-PCR (FLU A&B, COVID) ARPGX2
Influenza A by PCR: NEGATIVE
Influenza B by PCR: NEGATIVE
SARS Coronavirus 2 by RT PCR: NEGATIVE

## 2020-07-26 LAB — TROPONIN I (HIGH SENSITIVITY)
Troponin I (High Sensitivity): 78 ng/L — ABNORMAL HIGH (ref <18)
Troponin I (High Sensitivity): 72 ng/L — ABNORMAL HIGH (ref <18)

## 2020-07-26 LAB — D-DIMER, QUANTITATIVE: D-Dimer, Quant: 2 ug/mL-FEU — ABNORMAL HIGH (ref 0.00–0.50)

## 2020-07-26 LAB — ETHANOL: Alcohol, Ethyl (B): <10 mg/dL (ref <10)

## 2020-07-26 LAB — BRAIN NATRIURETIC PEPTIDE: B Natriuretic Peptide: 3606.8 pg/mL — ABNORMAL HIGH (ref 0.0–100.0)

## 2020-07-26 MED ORDER — IPRATROPIUM-ALBUTEROL 0.5-2.5 (3) MG/3ML IN SOLN
3.0000 mL | Freq: Four times a day (QID) | RESPIRATORY_TRACT | Status: DC
  Administered 2020-07-26 – 2020-07-27 (×3): 3 mL via RESPIRATORY_TRACT
  Filled 2020-07-26 (×3): qty 3

## 2020-07-26 MED ORDER — ONDANSETRON HCL 4 MG/2ML IJ SOLN: 4.0000 mg | Freq: Four times a day (QID) | INTRAMUSCULAR | Status: DC | PRN

## 2020-07-26 MED ORDER — QUETIAPINE FUMARATE 50 MG PO TABS
50.0000 mg | ORAL_TABLET | Freq: Every day | ORAL | Status: DC
  Administered 2020-07-27 – 2020-07-31 (×5): 100 mg via ORAL
  Filled 2020-07-26 (×6): qty 2

## 2020-07-26 MED ORDER — METHYLPREDNISOLONE SODIUM SUCC 125 MG IJ SOLR
60.0000 mg | Freq: Two times a day (BID) | INTRAMUSCULAR | Status: AC
  Administered 2020-07-27: 60 mg via INTRAVENOUS
  Filled 2020-07-26: qty 2

## 2020-07-26 MED ORDER — PANTOPRAZOLE SODIUM 40 MG PO TBEC
40.0000 mg | DELAYED_RELEASE_TABLET | Freq: Two times a day (BID) | ORAL | Status: DC
  Administered 2020-07-27 – 2020-08-01 (×11): 40 mg via ORAL
  Filled 2020-07-26 (×12): qty 1

## 2020-07-26 MED ORDER — IPRATROPIUM-ALBUTEROL 0.5-2.5 (3) MG/3ML IN SOLN: 3.0000 mL | Freq: Four times a day (QID) | RESPIRATORY_TRACT | Status: DC

## 2020-07-26 MED ORDER — SODIUM CHLORIDE 0.9% FLUSH
3.0000 mL | Freq: Two times a day (BID) | INTRAVENOUS | Status: DC
  Administered 2020-07-27 – 2020-07-31 (×10): 3 mL via INTRAVENOUS

## 2020-07-26 MED ORDER — MIDODRINE HCL 5 MG PO TABS
5.0000 mg | ORAL_TABLET | Freq: Three times a day (TID) | ORAL | Status: DC
  Administered 2020-07-27 – 2020-08-01 (×16): 5 mg via ORAL
  Filled 2020-07-26 (×16): qty 1

## 2020-07-26 MED ORDER — NICOTINE 21 MG/24HR TD PT24
21.0000 mg | MEDICATED_PATCH | Freq: Every day | TRANSDERMAL | Status: DC
  Administered 2020-07-26 – 2020-08-01 (×5): 21 mg via TRANSDERMAL
  Filled 2020-07-26 (×6): qty 1

## 2020-07-26 MED ORDER — BISACODYL 5 MG PO TBEC: 5.0000 mg | DELAYED_RELEASE_TABLET | Freq: Every day | ORAL | Status: DC | PRN

## 2020-07-26 MED ORDER — PREDNISONE 20 MG PO TABS
40.0000 mg | ORAL_TABLET | Freq: Every day | ORAL | Status: AC
  Administered 2020-07-27 – 2020-07-30 (×4): 40 mg via ORAL
  Filled 2020-07-26 (×4): qty 2

## 2020-07-26 MED ORDER — MIRTAZAPINE 15 MG PO TABS
15.0000 mg | ORAL_TABLET | Freq: Every day | ORAL | Status: DC
  Administered 2020-07-27 – 2020-07-31 (×5): 15 mg via ORAL
  Filled 2020-07-26 (×6): qty 1

## 2020-07-26 MED ORDER — ACETAMINOPHEN 650 MG RE SUPP: 650.0000 mg | Freq: Four times a day (QID) | RECTAL | Status: DC | PRN

## 2020-07-26 MED ORDER — DOCUSATE SODIUM 100 MG PO CAPS
100.0000 mg | ORAL_CAPSULE | Freq: Two times a day (BID) | ORAL | Status: DC
  Administered 2020-07-28 – 2020-08-01 (×7): 100 mg via ORAL
  Filled 2020-07-26 (×9): qty 1

## 2020-07-26 MED ORDER — FUROSEMIDE 10 MG/ML IJ SOLN
20.0000 mg | Freq: Once | INTRAMUSCULAR | Status: AC
  Administered 2020-07-26: 20 mg via INTRAVENOUS
  Filled 2020-07-26: qty 2

## 2020-07-26 MED ORDER — POLYETHYLENE GLYCOL 3350 17 G PO PACK: 17.0000 g | PACK | Freq: Every day | ORAL | Status: DC | PRN

## 2020-07-26 MED ORDER — ALBUTEROL SULFATE (2.5 MG/3ML) 0.083% IN NEBU: 2.5000 mg | INHALATION_SOLUTION | RESPIRATORY_TRACT | Status: DC | PRN

## 2020-07-26 MED ORDER — METHYLPREDNISOLONE SODIUM SUCC 125 MG IJ SOLR
125.0000 mg | Freq: Once | INTRAMUSCULAR | Status: AC
  Administered 2020-07-26: 125 mg via INTRAVENOUS
  Filled 2020-07-26: qty 2

## 2020-07-26 MED ORDER — ALPRAZOLAM 0.5 MG PO TABS
0.5000 mg | ORAL_TABLET | Freq: Two times a day (BID) | ORAL | Status: DC | PRN
  Administered 2020-07-27: 0.5 mg via ORAL
  Filled 2020-07-26: qty 1

## 2020-07-26 MED ORDER — OXYCODONE HCL 5 MG PO TABS
5.0000 mg | ORAL_TABLET | Freq: Four times a day (QID) | ORAL | Status: DC | PRN
  Administered 2020-07-27 – 2020-08-01 (×14): 5 mg via ORAL
  Filled 2020-07-26 (×15): qty 1

## 2020-07-26 MED ORDER — DULOXETINE HCL 60 MG PO CPEP
60.0000 mg | ORAL_CAPSULE | Freq: Two times a day (BID) | ORAL | Status: DC
  Administered 2020-07-27 – 2020-08-01 (×11): 60 mg via ORAL
  Filled 2020-07-26 (×12): qty 1

## 2020-07-26 MED ORDER — FUROSEMIDE 10 MG/ML IJ SOLN
40.0000 mg | Freq: Two times a day (BID) | INTRAMUSCULAR | Status: DC
  Administered 2020-07-26 – 2020-07-29 (×6): 40 mg via INTRAVENOUS
  Filled 2020-07-26 (×6): qty 4

## 2020-07-26 MED ORDER — LEVOTHYROXINE SODIUM 50 MCG PO TABS
50.0000 ug | ORAL_TABLET | Freq: Every day | ORAL | Status: DC
  Administered 2020-07-27 – 2020-08-01 (×6): 50 ug via ORAL
  Filled 2020-07-26 (×6): qty 1

## 2020-07-26 MED ORDER — ACETAMINOPHEN 325 MG PO TABS: 650.0000 mg | ORAL_TABLET | Freq: Four times a day (QID) | ORAL | Status: DC | PRN

## 2020-07-26 MED ORDER — PREGABALIN 50 MG PO CAPS
50.0000 mg | ORAL_CAPSULE | Freq: Three times a day (TID) | ORAL | Status: DC
  Administered 2020-07-27 – 2020-08-01 (×16): 50 mg via ORAL
  Filled 2020-07-26 (×16): qty 1

## 2020-07-26 MED ORDER — ENOXAPARIN SODIUM 30 MG/0.3ML IJ SOSY
30.0000 mg | PREFILLED_SYRINGE | INTRAMUSCULAR | Status: DC
  Administered 2020-07-27 – 2020-07-31 (×5): 30 mg via SUBCUTANEOUS
  Filled 2020-07-26 (×5): qty 0.3

## 2020-07-26 MED ORDER — ALBUTEROL SULFATE HFA 108 (90 BASE) MCG/ACT IN AERS
4.0000 | INHALATION_SPRAY | Freq: Once | RESPIRATORY_TRACT | Status: AC
  Administered 2020-07-26: 4 via RESPIRATORY_TRACT
  Filled 2020-07-26: qty 6.7

## 2020-07-26 MED ORDER — ASPIRIN EC 81 MG PO TBEC: 81.0000 mg | DELAYED_RELEASE_TABLET | Freq: Every day | ORAL | Status: DC

## 2020-07-26 MED ORDER — ONDANSETRON HCL 4 MG PO TABS: 4.0000 mg | ORAL_TABLET | Freq: Four times a day (QID) | ORAL | Status: DC | PRN

## 2020-07-26 NOTE — ED Notes (Signed)
Critical result given to Dr. Tomi Bamberger

## 2020-07-26 NOTE — Progress Notes (Signed)
Patient arrived to 2wbed 23 with RRT to accompany. Patient in no acute distress, alert and oriented. Able to transfer self from stretcher to bed successfully. Patient is on BiPap. Will notify physician if any instability occurs or any changes. Current vital signs are stable.

## 2020-07-26 NOTE — ED Notes (Signed)
Report attempted, bed not approve per CN yet.

## 2020-07-26 NOTE — ED Notes (Signed)
RT educated pt and spouse on the importance of knowing when to use her nebulizer/MDI for SOB especially in weather with high heat and humidity to give her some relief. Pt has has COPD and states her breathing is worse when hot and humid. Pt and spouse able to express understanding of information and reasoning. RT will continue to monitor.

## 2020-07-26 NOTE — ED Notes (Signed)
Staff notified of pt O2 levels

## 2020-07-26 NOTE — ED Notes (Signed)
Pt insisting to get off CPAP to have something to eat, pt and family oriented multiple time that her SPO2 drops on the low 80's. Pt am family reoriented that at this time pt is not able to tolerate been out of CPAP, RT called to reconnect pt on the CPAP. Pt unable to eat even when attempted to place her on O2 [place]. RT reconnected pt on CPAP pt resting on bed with bilateral rails up, pt no on acute distress.

## 2020-07-26 NOTE — ED Notes (Signed)
RT and RN transported pt from ED 19 to 2W23 on BIPAP w/out complication. Pt respiratory status remained stable w/no distress noted throughout transport. RT exchanged pts Mask from a medium to a small d/t significant leakage r/t poor fit. RT will continue to monitor.

## 2020-07-26 NOTE — ED Triage Notes (Signed)
Pt brought to ED by [facility] from PCP for c/o hypoxia and weakness for the past 2 months. Pt states she normally use O 2 [place] at home on 3 L and had to increase to 4L. SPO2 92% on 2L. Pt AO 4 on arrival to ED.

## 2020-07-26 NOTE — ED Provider Notes (Signed)
[facility] EMERGENCY DEPARTMENT Provider Note   CSN: [id number] Arrival date & time: 07/26/20  1216     History Chief Complaint  Patient presents with  . Shortness of Breath    [name] is a 65 y.o. female.   Shortness of Breath Associated symptoms: chest pain   Associated symptoms: no rash    Patient presents to the ED for evaluation of difficulty breathing.  Patient has history of COPD as well as other medical problems.  Patient was at her doctor's office today.  Apparently when she first walked in to the facility she was not wearing her oxygen.  Patient appeared confused to the medical staff.  She was aware where she was but not the date.  She was placed on oxygen and EMS was called.  Patient was started on oxygen.  Patient states she has been having difficulty with depression lately.  She states she has been having some issues with feeling confused at times.  Patient states she still smokes cigarettes but has managed to cut it down to 1 pack/day.  Family reported to EMS that the patient has been having issues with weakness and difficulty breathing over the last couple months.  Patient denies any fevers although she has not been taking her temperature.  She has had some pain in her chest over the last week or so intermittently.  She has noticed some leg swelling.  Past Medical History:  Diagnosis Date  . Anemia   . Anxiety and depression   . Bronchitis   . Chronic pain   . COPD (chronic obstructive pulmonary disease) ([place])   . Gastroesophageal reflux disease   . History of cardiac catheterization    Normal coronary arteries 2015  . Hyperlipidemia   . Hypertension   . Hypothyroid   . Migraine headache   . Near syncope   . On home oxygen therapy    Started 08/2018  . Peripheral vascular disease ([place])   . Pneumonia   . Tobacco abuse    1/2 pack per day  . Uterine cancer [facility])     Patient Active Problem List   Diagnosis Date Noted  .  Idiopathic hypotension 10/08/2019  . Encounter for long-term use of opiate analgesic 03/12/2019  . IDA (iron deficiency anemia) 02/17/2019  . Gastritis and gastroduodenitis 02/17/2019  . COPD  GOLD II / still smoking  12/08/2018  . Chronic respiratory failure with hypoxia and hypercapnia ([place]) 12/08/2018  . Radiculopathy due to lumbar intervertebral disc disorder 10/29/2018  . Sciatica of left side 10/29/2018  . Anemia   . Gastrointestinal hemorrhage   . Elevated LFTs   . Hypokalemia 08/24/2018  . COPD exacerbation ([place]) 03/18/2014  . Acute kidney injury ([place]) 03/18/2014  . Acute respiratory failure with hypoxia ([place]) 03/18/2014  . CAP (community acquired pneumonia) 03/17/2014  . Pneumonia 03/17/2014  . HTN (hypertension) 12/03/2012  . Sepsis ([place]) 02/21/2012  . PNA (pneumonia) 02/21/2012  . Adrenal insufficiency ([place]) 02/21/2012  . Subclavian steal syndrome 02/21/2012  . Chronic pain 11/22/2010  . Migraine headache 11/22/2010  . Gastroesophageal reflux disease 11/22/2010  . Encounter for therapeutic drug monitoring 11/22/2010  . Cerebrovascular disease 07/24/2010  . Anxiety and depression   . Hyperlipidemia   . Hypothyroid   . Cigarette smoker     Past Surgical History:  Procedure Laterality Date  . ABDOMINAL HYSTERECTOMY     without oophorectomy for neoplastic disease  . BACK SURGERY    . BIOPSY  10/22/2018  Procedure: BIOPSY;  Surgeon: Danie Binder, MD;  Location: [facility];  Service: Endoscopy;;  duodenal, gastric  . CARDIAC CATHETERIZATION  07/2013   normal coronary arteries  . CARPAL TUNNEL RELEASE     Left  . CHOLECYSTECTOMY  2007  . DILATION AND CURETTAGE OF UTERUS    . ESOPHAGOGASTRODUODENOSCOPY (EGD) WITH PROPOFOL N/A 10/22/2018   gastritis (mild reactive gastropathy, negative duodenal biopsies for celiac).   Marland Kitchen LAPAROTOMY    . LEFT HEART CATHETERIZATION WITH CORONARY ANGIOGRAM N/A 07/12/2013   Procedure: LEFT HEART CATHETERIZATION WITH CORONARY  ANGIOGRAM;  Surgeon: Leonie Man, MD;  Location: [facility] CATH LAB;  Service: Cardiovascular;  Laterality: N/A;  . LUMBAR LAMINECTOMY  x3   Left; complicated by neurologic dysfunction  . NASAL SINUS SURGERY       OB History     Gravida      Para      Term      Preterm      AB      Living  2      SAB      IAB      Ectopic      Multiple      Live Births              Family History  Problem Relation Age of Onset  . Colon polyps Mother   . Deep vein thrombosis Mother   . Heart disease Mother   . Hypertension Mother   . Bleeding Disorder Mother   . Hypertension Sister   . Heart disease Brother        before age 44  . Hypertension Brother     Social History   Tobacco Use  . Smoking status: Every Day    Packs/day: 0.25    Years: 40.00    Pack years: 10.00    Types: Cigarettes    Start date: 09/06/1972  . Smokeless tobacco: Never  . Tobacco comments:    2 cigarettes per day-12/06/2019  Vaping Use  . Vaping Use: Never used  Substance Use Topics  . Alcohol use: No    Alcohol/week: 0.0 standard drinks  . Drug use: No    Home Medications Prior to Admission medications   Medication Sig Start Date End Date Taking? Authorizing Provider  albuterol (PROAIR HFA) 108 (90 Base) MCG/ACT inhaler Inhale 2 puffs into the lungs every 4 (four) hours as needed for wheezing or shortness of breath (cough). 12/06/19   Tanda Rockers, MD  albuterol (PROVENTIL) (2.5 MG/3ML) 0.083% nebulizer solution Take 3 mLs (2.5 mg total) by nebulization every 4 (four) hours as needed for wheezing or shortness of breath (cough). 08/27/18   Roxan Hockey, MD  ALPRAZolam Duanne Moron) 0.5 MG tablet Take 1 tablet (0.5 mg total) by mouth 2 (two) times daily as needed. for anxiety 03/20/20   Lovorn, Jinny Blossom, MD  aspirin EC 81 MG tablet Take 81 mg by mouth daily.    [provider]  DULoxetine (CYMBALTA) 60 MG capsule Take 1 capsule (60 mg total) by mouth 2 (two) times daily. 04/07/20   Lovorn,  Jinny Blossom, MD  fluticasone (FLONASE) 50 MCG/ACT nasal spray Place 1-2 sprays into both nostrils daily as needed for allergies.     [provider]  levothyroxine (SYNTHROID) 50 MCG tablet Take 1 tablet (50 mcg total) by mouth daily before breakfast. 04/07/20   Lovorn, Jinny Blossom, MD  midodrine (PROAMATINE) 5 MG tablet Take 1 tablet (5 mg total) by mouth 3 (three) times  daily with meals. 04/07/20   Lovorn, Jinny Blossom, MD  mirtazapine (REMERON) 15 MG tablet TAKE (1) TABLET BY MOUTH AT BEDTIME. 04/18/20   Lovorn, Jinny Blossom, MD  OVER THE COUNTER MEDICATION Take 1 capsule by mouth daily. Amberen Multi-Symptom Menopause Relief    [provider]  oxyCODONE (OXY IR/ROXICODONE) 5 MG immediate release tablet TAKE (1) TABLET BY MOUTH EVERY SIX HOURS AS NEEDED FOR SEVERE PAIN. 07/18/20   Lovorn, Jinny Blossom, MD  pantoprazole (PROTONIX) 40 MG tablet TAKE ONE TABLET BY MOUTH TWICE DAILY BEFORE A MEAL. 01/19/20   Annitta Needs, NP  pregabalin (LYRICA) 50 MG capsule Take 1 capsule (50 mg total) by mouth 3 (three) times daily. Actually 1 in Am and 2 at night- for nerve pain 04/07/20   Lovorn, Jinny Blossom, MD  QUEtiapine (SEROQUEL) 50 MG tablet Take 1-2 tablets (50-100 mg total) by mouth at bedtime. 01/07/20   Lovorn, Jinny Blossom, MD  UNABLE TO FIND Med Name: OTC Amberen (for hot flashes)    [provider]    Allergies    Mirtazapine, Acetaminophen-codeine, Aspirin, Astelin [azelastine], Bactrim [sulfamethoxazole-trimethoprim], Celecoxib, Ciprofloxacin, Fentanyl, Levaquin [levofloxacin in d5w], Lipitor [atorvastatin], Neomycin, Salicylates, Sulfa antibiotics, Adhesive [tape], Latex, and Tegretol [carbamazepine]  Review of Systems   Review of Systems  Respiratory:  Positive for shortness of breath.   Cardiovascular:  Positive for chest pain.  Musculoskeletal:  Negative for myalgias.  Skin:  Negative for rash.  Neurological:  Negative for speech difficulty.  All other systems reviewed and are negative.  Physical Exam Updated  Vital Signs BP (!) 158/57   Pulse 70   Temp 98.2 F (36.8 C) (Oral)   Resp 16   Ht 1.575 m (5\' 2" )   Wt 47.6 kg   SpO2 98%   BMI 19.20 kg/m   Physical Exam Vitals and nursing note reviewed.  Constitutional:      Appearance: She is not diaphoretic.     Comments: Frail, appears older than age  HENT:     Head: Normocephalic and atraumatic.     Right Ear: External ear normal.     Left Ear: External ear normal.  Eyes:     General: No scleral icterus.       Right eye: No discharge.        Left eye: No discharge.     Conjunctiva/sclera: Conjunctivae normal.  Neck:     Trachea: No tracheal deviation.  Cardiovascular:     Rate and Rhythm: Normal rate and regular rhythm.  Pulmonary:     Effort: Pulmonary effort is normal. No respiratory distress.     Breath sounds: No stridor. Decreased breath sounds present. No wheezing or rales.  Abdominal:     General: Bowel sounds are normal. There is no distension.     Palpations: Abdomen is soft.     Tenderness: There is no abdominal tenderness. There is no guarding or rebound.  Musculoskeletal:        General: No tenderness or deformity.     Cervical back: Neck supple.     Right lower leg: Edema present.     Left lower leg: Edema present.  Skin:    General: Skin is warm and dry.     Findings: No rash.  Neurological:     General: No focal deficit present.     Mental Status: She is alert.     Cranial Nerves: No cranial nerve deficit (no facial droop, extraocular movements intact, no slurred speech).     Sensory: No sensory  deficit.     Motor: No abnormal muscle tone or seizure activity.     Coordination: Coordination normal.     Comments: Answers questions appropriately, follows commands  Psychiatric:        Mood and Affect: Mood normal.    ED Results / Procedures / Treatments   Labs (all labs ordered are listed, but only abnormal results are displayed) Labs Reviewed  COMPREHENSIVE METABOLIC PANEL - Abnormal; Notable for the  following components:      Result Value   Chloride 93 (*)    Glucose, Bld 107 (*)    BUN 44 (*)    Creatinine, Ser 2.90 (*)    Albumin 3.3 (*)    Alkaline Phosphatase 135 (*)    Total Bilirubin 2.1 (*)    GFR, Estimated 18 (*)    All other components within normal limits  CBC WITH DIFFERENTIAL/PLATELET - Abnormal; Notable for the following components:   WBC 10.7 (*)    RBC 5.29 (*)    MCH 23.8 (*)    MCHC 28.0 (*)    RDW 20.9 (*)    nRBC 0.3 (*)    Neutro Abs 8.1 (*)    Monocytes Absolute 1.3 (*)    Abs Immature Granulocytes 0.10 (*)    All other components within normal limits  BRAIN NATRIURETIC PEPTIDE - Abnormal; Notable for the following components:   B Natriuretic Peptide 3,606.8 (*)    All other components within normal limits  D-DIMER, QUANTITATIVE - Abnormal; Notable for the following components:   D-Dimer, Quant 2.00 (*)    All other components within normal limits  I-STAT VENOUS BLOOD GAS, ED - Abnormal; Notable for the following components:   pCO2, Ven 72.0 (*)    pO2, Ven 75.0 (*)    Bicarbonate 33.3 (*)    TCO2 35 (*)    Acid-Base Excess 4.0 (*)    Calcium, Ion 1.13 (*)    Hemoglobin 15.6 (*)    All other components within normal limits  TROPONIN I (HIGH SENSITIVITY) - Abnormal; Notable for the following components:   Troponin I (High Sensitivity) 78 (*)    All other components within normal limits  RESP PANEL BY RT-PCR (FLU A&B, COVID) ARPGX2  ETHANOL  RAPID URINE DRUG SCREEN, HOSP PERFORMED  URINALYSIS, ROUTINE W REFLEX MICROSCOPIC  TROPONIN I (HIGH SENSITIVITY)    EKG EKG Interpretation  Date/Time:  Wednesday July 26 2020 12:27:53 EDT Ventricular Rate:  87 PR Interval:  166 QRS Duration: 147 QT Interval:  416 QTC Calculation: 501 R Axis:   95 Text Interpretation: Sinus rhythm Ventricular bigeminy , new since last tracing Anterior infarct, old Prolonged QT interval Confirmed by Dorie Rank [phone]) on 07/26/2020 12:36:45 PM  Radiology DG Chest  Port 1 View  Result Date: 07/26/2020 CLINICAL DATA:  Dyspnea EXAM: PORTABLE CHEST 1 VIEW COMPARISON:  06/11/2019 FINDINGS: Cardiomegaly. Scarring and or atelectasis at the bilateral lung bases. Emphysema. The visualized skeletal structures are unremarkable. IMPRESSION: 1.  Cardiomegaly. 2. Scarring and or atelectasis at the bilateral lung bases without acute appearing airspace opacity. 3.  Emphysema. Electronically Signed   By: Eddie Candle M.D.   On: 07/26/2020 13:48    Procedures Procedures   Medications Ordered in ED Medications  furosemide (LASIX) injection 20 mg (has no administration in time range)  albuterol (VENTOLIN HFA) 108 (90 Base) MCG/ACT inhaler 4 puff (4 puffs Inhalation Given 07/26/20 1404)  methylPREDNISolone sodium succinate (SOLU-MEDROL) 125 mg/2 mL injection 125 mg (125 mg Intravenous  Given 07/26/20 1424)    ED Course  I have reviewed the triage vital signs and the nursing notes.  Pertinent labs & imaging results that were available during my care of the patient were reviewed by me and considered in my medical decision making (see chart for details).  Clinical Course as of 07/26/20 1539  Wed Jul 26, 2020  1247 We will start patient on BiPAP [JK]  1338 His blood gas shows elevated PCO2 and decreased venous pH [JK]  1409 Chest x-ray shows cardiomegaly and emphysema [JK]  1452 D-dimer elevated [JK]  1509 Creatinine is increased compared to previous [JK]  1510 BNP is elevated [JK]  1510 Troponin also elevated compared to previous [JK]  1517 Still drowsy.  WIll hold off on vq while she is on Bipap.  Will start on heparin empirically. [JK]    Clinical Course User Index [JK] Dorie Rank, MD   MDM Rules/Calculators/A&P                          Patient presented to the ED for evaluation of shortness of breath.  Patient was noted to be off her oxygen at the doctor's office.  In the ED the patient has improved her on nasal cannula oxygen.  Her oxygen saturation were  decreased with any minimal effort.  Patient indicated she was also feeling confused.  Her laboratory test did show a component of hypercarbia and respiratory acidosis.  Patient was started on BiPAP.  Her laboratory tests suggest a component of CHF on top of her COPD.  Chest x-ray does not show evidence of pneumonia.  Patient does have an elevated D-dimer.  She has acute renal insufficiency and chronic VQ scan.  I suspect that her symptoms are most likely related to her COPD and CHF.  I have ordered a dose of diuretics.  I will consult the medical service for admission Final Clinical Impression(s) / ED Diagnoses Final diagnoses:  Congestive heart failure, unspecified HF chronicity, unspecified heart failure type ([place])  COPD exacerbation ([place])  Positive D dimer  Hypercarbia  AKI (acute kidney injury) ([place])     Dorie Rank, MD 07/26/20 1520

## 2020-07-26 NOTE — ED Notes (Signed)
Dentures given to husband at the bedside.

## 2020-07-26 NOTE — ED Notes (Signed)
Pt still very labored breathing at this time, unable to go to VQ scan today per ED provider ok for pt to get this done early morning.

## 2020-07-26 NOTE — H&P (Signed)
History and Physical    [name] EHM:[id number] DOB: 1955/02/24 DOA: 07/26/2020  PCP: [name], [name], MD - has not seen her yet Consultants:  Domenic Polite - cardiology; Wert - pulmonology; Lovorn - PM&R; Fields - GI Patient coming from:  Home - lives with husband; NOK: [place], [place], [phone]  Chief Complaint: SOB/hypoxia  HPI: [name] is a 65 y.o. female with medical history significant of chronic pain; COPD on home O2; HTN; HLD; hypothyroidism; uterine cancer; and PVD presenting with SOB/hypoxia.  She was seen at rehab today and was noted to not be wearing her O2 and was quite hypoxic.  She is currently on BIPAP.  Her husband reports that she has not been eating anything for the last few days.  She has been drinking a lot of [employer].  She rarely leaves the house and hasn't been wearing her O2.  Her friend died a few months ago and she has been very depressed.  He does not think she is intentionally trying to hurt herself, just giving up.  He had the O2 in the truck today but she refused it when she went to the doctor.  She has cut back to 1/2-1 ppd; they are both trying to quit.  She is having LE edema x 2-3 weeks.  She had been diagnosed with CHF several years ago, has not seen a cardiologist since then.      ED Course: Combination of CHF/COPD/hypercarbia/AKI.  D-dimer is elevated, low suspicion for PE but can't do VQ until tomorrow.  Went to rehab today and found to be hypoxic with confusion - O2 in 60s but not on home O2.  Started on O2 and sats improved.  Continuing to smoke, has "cut back to 1 ppd".  On BIPAP, given Lasix, still a bit confused.  Review of Systems: Unable to perform   Ambulatory Status:  Ambulates without assistance  COVID Vaccine Status:   Complete  Past Medical History:  Diagnosis Date   Anemia    Anxiety and depression    Bronchitis    Chronic pain    COPD (chronic obstructive pulmonary disease) (HCC)     Gastroesophageal reflux disease    History of cardiac catheterization    Normal coronary arteries 2015   Hyperlipidemia    Hypertension    Hypothyroid    Migraine headache    Near syncope    On home oxygen therapy    Started 08/2018   Peripheral vascular disease ([place])    Pneumonia    Tobacco abuse    1/2 pack per day   Uterine cancer [facility])     Past Surgical History:  Procedure Laterality Date   ABDOMINAL HYSTERECTOMY     without oophorectomy for neoplastic disease   BACK SURGERY     BIOPSY  10/22/2018   Procedure: BIOPSY;  Surgeon: Danie Binder, MD;  Location: [facility];  Service: Endoscopy;;  duodenal, gastric   CARDIAC CATHETERIZATION  07/2013   normal coronary arteries   CARPAL TUNNEL RELEASE     Left   CHOLECYSTECTOMY  2007   DILATION AND CURETTAGE OF UTERUS     ESOPHAGOGASTRODUODENOSCOPY (EGD) WITH PROPOFOL N/A 10/22/2018   gastritis (mild reactive gastropathy, negative duodenal biopsies for celiac).    LAPAROTOMY     LEFT HEART CATHETERIZATION WITH CORONARY ANGIOGRAM N/A 07/12/2013   Procedure: LEFT HEART CATHETERIZATION WITH CORONARY ANGIOGRAM;  Surgeon: Leonie Man, MD;  Location: [facility] CATH LAB;  Service: Cardiovascular;  Laterality: N/A;   LUMBAR LAMINECTOMY  x3   Left; complicated by neurologic dysfunction   NASAL SINUS SURGERY      Social History   Socioeconomic History   Marital status: Married    Spouse name: Not on file   Number of children: 2   Years of education: Not on file   Highest education level: Not on file  Occupational History    Comment: Disabled  Tobacco Use   Smoking status: Every Day    Packs/day: 0.25    Years: 40.00    Pack years: 10.00    Types: Cigarettes    Start date: 09/06/1972   Smokeless tobacco: Never   Tobacco comments:    2 cigarettes per day-12/06/2019  Vaping Use   Vaping Use: Never used  Substance and Sexual Activity   Alcohol use: No    Alcohol/week: 0.0 standard drinks   Drug use: No   Sexual activity: Not  Currently    Birth control/protection: Surgical  Other Topics Concern   Not on file  Social History Narrative   Not on file   Social Determinants of Health   Financial Resource Strain: Not on file  Food Insecurity: Not on file  Transportation Needs: Not on file  Physical Activity: Not on file  Stress: Not on file  Social Connections: Not on file  Intimate Partner Violence: Not on file    Allergies  Allergen Reactions   Mirtazapine Other (See Comments)    halucinations   Acetaminophen-Codeine Nausea And Vomiting   Aspirin Nausea And Vomiting    (can take coated aspirin)   Astelin [Azelastine]    Bactrim [Sulfamethoxazole-Trimethoprim]    Celecoxib Other (See Comments)    "does the opposite of what it is suppose to do"   Ciprofloxacin    Fentanyl    Levaquin [Levofloxacin In D5w] Itching   Lipitor [Atorvastatin] Other (See Comments)    Myalgias    Neomycin    Salicylates    Sulfa Antibiotics Nausea And Vomiting   Adhesive [Tape] Rash   Latex Rash   Tegretol [Carbamazepine] Rash    Family History  Problem Relation Age of Onset   Colon polyps Mother    Deep vein thrombosis Mother    Heart disease Mother    Hypertension Mother    Bleeding Disorder Mother    Hypertension Sister    Heart disease Brother        before age 64   Hypertension Brother     Prior to Admission medications   Medication Sig Start Date End Date Taking? Authorizing Provider  albuterol (PROAIR HFA) 108 (90 Base) MCG/ACT inhaler Inhale 2 puffs into the lungs every 4 (four) hours as needed for wheezing or shortness of breath (cough). 12/06/19   Tanda Rockers, MD  albuterol (PROVENTIL) (2.5 MG/3ML) 0.083% nebulizer solution Take 3 mLs (2.5 mg total) by nebulization every 4 (four) hours as needed for wheezing or shortness of breath (cough). 08/27/18   Roxan Hockey, MD  ALPRAZolam Duanne Moron) 0.5 MG tablet Take 1 tablet (0.5 mg total) by mouth 2 (two) times daily as needed. for anxiety 03/20/20    Lovorn, Jinny Blossom, MD  aspirin EC 81 MG tablet Take 81 mg by mouth daily.    [provider]  DULoxetine (CYMBALTA) 60 MG capsule Take 1 capsule (60 mg total) by mouth 2 (two) times daily. 04/07/20   Lovorn, Jinny Blossom, MD  fluticasone (FLONASE) 50 MCG/ACT nasal spray Place 1-2 sprays into both nostrils daily as needed  for allergies.     [provider]  levothyroxine (SYNTHROID) 50 MCG tablet Take 1 tablet (50 mcg total) by mouth daily before breakfast. 04/07/20   Lovorn, Jinny Blossom, MD  midodrine (PROAMATINE) 5 MG tablet Take 1 tablet (5 mg total) by mouth 3 (three) times daily with meals. 04/07/20   Lovorn, Jinny Blossom, MD  mirtazapine (REMERON) 15 MG tablet TAKE (1) TABLET BY MOUTH AT BEDTIME. 04/18/20   Lovorn, Jinny Blossom, MD  OVER THE COUNTER MEDICATION Take 1 capsule by mouth daily. Amberen Multi-Symptom Menopause Relief    [provider]  oxyCODONE (OXY IR/ROXICODONE) 5 MG immediate release tablet TAKE (1) TABLET BY MOUTH EVERY SIX HOURS AS NEEDED FOR SEVERE PAIN. 07/18/20   Lovorn, Jinny Blossom, MD  pantoprazole (PROTONIX) 40 MG tablet TAKE ONE TABLET BY MOUTH TWICE DAILY BEFORE A MEAL. 01/19/20   Annitta Needs, NP  pregabalin (LYRICA) 50 MG capsule Take 1 capsule (50 mg total) by mouth 3 (three) times daily. Actually 1 in Am and 2 at night- for nerve pain 04/07/20   Lovorn, Jinny Blossom, MD  QUEtiapine (SEROQUEL) 50 MG tablet Take 1-2 tablets (50-100 mg total) by mouth at bedtime. 01/07/20   Lovorn, Jinny Blossom, MD  UNABLE TO FIND Med Name: Sherrilyn Rist (for hot flashes)    [provider]    Physical Exam: Vitals:   07/26/20 1425 07/26/20 1500 07/26/20 1530 07/26/20 1551  BP:  (!) 158/57 (!) 146/69   Pulse:  70 69 73  Resp:  16 16 16   Temp: 98.2 F (36.8 C)     TempSrc: Oral     SpO2:  98% 94% 93%  Weight:      Height:         General:  Appears somnolent but easily awakened; trying to talk but difficult to understand due to BIPAP Eyes:   EOMI, normal lids, iris ENT:  grossly normal hearing,  BIPAP in place Neck:  no LAD, masses or thyromegaly Cardiovascular:  RRR, no m/r/g. 2-3+ LE edema.  Respiratory:   CTA bilaterally with no wheezes/rales/rhonchi.  Normal respiratory effort. On BIPAP. Abdomen:  soft, NT, ND Skin:  no rash or induration seen on limited exam Musculoskeletal:  grossly normal tone BUE/BLE, good ROM, no bony abnormality Lower extremity:  No LE edema.  Limited foot exam with no ulcerations.  2+ distal pulses. Psychiatric:  flat mood and affect, speech difficult to interpret due to BIPAP Neurologic:  unable to effectively perform    Radiological Exams on Admission: Independently reviewed - see discussion in A/P where applicable  DG Chest Port 1 View  Result Date: 07/26/2020 CLINICAL DATA:  Dyspnea EXAM: PORTABLE CHEST 1 VIEW COMPARISON:  06/11/2019 FINDINGS: Cardiomegaly. Scarring and or atelectasis at the bilateral lung bases. Emphysema. The visualized skeletal structures are unremarkable. IMPRESSION: 1.  Cardiomegaly. 2. Scarring and or atelectasis at the bilateral lung bases without acute appearing airspace opacity. 3.  Emphysema. Electronically Signed   By: Eddie Candle M.D.   On: 07/26/2020 13:48    EKG: Independently reviewed.  NSR with rate 87 with new bigeminy; prolonged QTc 501; nonspecific ST changes with no evidence of acute ischemia   Labs on Admission: I have personally reviewed the available labs and imaging studies at the time of the admission.  Pertinent labs:   VBG: 7.273/72/75/33.3/92%; pCO2 was 57.8 on 06/11/19 BUN 44/Creatinine 2.90/GFR 18; 13/1.35/42 on 06/11/19 Albumin 3.3 Bili 2.1 BNP 3606.8; 492.4 on 06/11/19 HS troponin 78 WBC 10.7 COVID/flu negative ETOH <10   Assessment/Plan  Principal Problem:   Acute on chronic respiratory failure with hypoxia and hypercapnia (HCC) Active Problems:   Hypothyroid   Cigarette smoker   Anxiety and depression   Chronic pain   Acute kidney injury (HCC)   COPD  GOLD II / still smoking     Idiopathic hypotension   Acute on chronic combined systolic and diastolic CHF (congestive heart failure) (HCC)   Acute on chronic respiratory failure with hypoxia/hypercapnia -Patient with chronic O2-dependent COPD who does not routinely wear her O2 -Found to be hypoxic without O2 today in the 60s -Also with recent LE edema -Also with very poor PO intake - drinks lots of [facility] and eats very little -Likely multifactorial respiratory failure with chronic COPD and O2-noncompliance compounded by apparent acute CHF -Palliative care consult requested  Acute on chronic combined CHF -Patient with known h/o chronic combined CHF (echo in 08/2018 with EF 50-55% and "impaired relaxation" NOS) presenting with worsening SOB and hypoxia -CXR without apparent pulmonary edema -Markedly elevated BNP compared to prior -Acknowledged very poor dietary habits -Likely acute on chronic CHF in conjunction with baseline COPD -Based on severe hypoxia and hypercarbia, she was started on BIPAP -Will admit, to progressive care -Will request echocardiogram -Will start ASA -ACE/ARB, beta blocker, and spironolactone are recommended as per guideline-directed medical therapy to reduce morbidity/mortality; unfortunately, she takes Midodrine for hypotension and so is unlikely to tolerate there other therapies -CHF order set utilized; may need CHF team consult but will hold until Echo results are available -Was given Lasix 20 mg x 1 in ER and will repeat with 40 mg IV BID -Continue BIPAP for now -Suggest nutrition consult once more stable  COPD with ongoing tobacco dependence -Patient with baseline O2-dependent COPD but has not been consistently wearing her O2 -This is very likely contributing to her current presentation -She does not have fever and has mild leukocytosis.  -Chest x-ray is not consistent with pneumonia -Nebulizers: scheduled Duoneb and prn albuterol -Solu-Medrol 60 mg IV BID -> Prednisone 40 mg PO  daily -No antibiotics for now -Tobacco Dependence: encourage cessation.   -Patch ordered  AKI -Likely associated with diminished renal perfusion in the setting of CHF -Diurese and follow for now  Depression -Husband reports passive SI -Continue Remeron, Cymbalta, Seroquel, Xanax -Consider psych consult once more stable  Hypotension -This issue will make it difficult to effectively treat CHF -Continue Midodrine  Chronic pain -I have reviewed this patient in the [place] Controlled Substance Reporting System.  She is receiving medications from only one provider and appears to be taking them as prescribed. -She is at particularly high risk of opioid misuse or diversion. -Continue Oxycodone, Lyrica -Renal function will not support morphine, and she has a listed fentanyl allergy  Hypothyroidism -Continue Synthroid at current dose for now    Note: This patient has been tested and is negative for the novel coronavirus COVID-19. She has been fully vaccinated against COVID-19.   DVT prophylaxis: Lovenox  Code Status:  Full - confirmed with patient Family Communication: None present; I spoke with her husband by telephone at the time of admission. Disposition Plan:  The patient is from: home  Anticipated d/c is to: home, possibly with [facility] services  Anticipated d/c date will depend on clinical response to treatment, likely 2-4 days  Patient is currently: acutely ill Consults called: Palliative care; TOC team Admission status: Admit - It is my clinical opinion that admission to INPATIENT is reasonable and necessary because this patient will require  at least 2 midnights in the hospital to treat this condition based on the medical complexity of the problems presented.  Given the aforementioned information, the predictability of an adverse outcome is felt to be significant.     Karmen Bongo MD [facility]   How to contact the [facility] Attending or Consulting provider [place] or covering  provider during after hours [place], for this patient?  Check the care team in [facility] and look for a) attending/consulting [facility] provider listed and b) the [facility] team listed Log into [email or web address] and use [place]'s universal password to access. If you do not have the password, please contact the hospital operator. Locate the [facility] provider you are looking for under [facility] and page to a number that you can be directly reached. If you still have difficulty reaching the provider, please page the [facility] (Director on Call) for the Hospitalists listed on amion for assistance.   07/26/2020, 4:38 PM

## 2020-07-26 NOTE — ED Notes (Signed)
Pt placed on Bipap by RT per ED provider order.

## 2020-07-26 NOTE — Progress Notes (Signed)
Saw pt- her sats were 57-62%- on 4L O2- were 40-50s when first got here- didn't come in wearing O2.  Was confused. Knew she was here, but not month, day, date.  Looks slightly jaundiced? Called 911 and sent to ED immediately.

## 2020-07-26 NOTE — ED Notes (Signed)
Pt on Bipap at beginning of shift. Pt tolerating well, breathing has improved pt still lethargic. RT will continue to monitor

## 2020-07-27 ENCOUNTER — Inpatient Hospital Stay (HOSPITAL_COMMUNITY): Payer: Medicare Other

## 2020-07-27 DIAGNOSIS — J9621 Acute and chronic respiratory failure with hypoxia: Secondary | ICD-10-CM

## 2020-07-27 DIAGNOSIS — R778 Other specified abnormalities of plasma proteins: Secondary | ICD-10-CM

## 2020-07-27 DIAGNOSIS — I950 Idiopathic hypotension: Secondary | ICD-10-CM

## 2020-07-27 DIAGNOSIS — J449 Chronic obstructive pulmonary disease, unspecified: Secondary | ICD-10-CM

## 2020-07-27 DIAGNOSIS — I5043 Acute on chronic combined systolic (congestive) and diastolic (congestive) heart failure: Secondary | ICD-10-CM

## 2020-07-27 DIAGNOSIS — N179 Acute kidney failure, unspecified: Secondary | ICD-10-CM

## 2020-07-27 DIAGNOSIS — E785 Hyperlipidemia, unspecified: Secondary | ICD-10-CM

## 2020-07-27 DIAGNOSIS — E039 Hypothyroidism, unspecified: Secondary | ICD-10-CM

## 2020-07-27 LAB — ECHOCARDIOGRAM COMPLETE
AR max vel: 1.68 cm2
AV Area VTI: 1.67 cm2
AV Area mean vel: 1.63 cm2
AV Mean grad: 7.5 mmHg
AV Peak grad: 14.3 mmHg
Ao pk vel: 1.89 m/s
Area-P 1/2: 3.99 cm2
Height: 62 in
MV VTI: 1.67 cm2
S' Lateral: 3.7 cm
Weight: 1878.4 oz

## 2020-07-27 LAB — URINALYSIS, ROUTINE W REFLEX MICROSCOPIC
Bilirubin Urine: NEGATIVE
Glucose, UA: NEGATIVE mg/dL
Hgb urine dipstick: NEGATIVE
Ketones, ur: NEGATIVE mg/dL
Leukocytes,Ua: NEGATIVE
Nitrite: NEGATIVE
Protein, ur: NEGATIVE mg/dL
Specific Gravity, Urine: 1.008 (ref 1.005–1.030)
pH: 5 (ref 5.0–8.0)

## 2020-07-27 LAB — RAPID URINE DRUG SCREEN, HOSP PERFORMED
Amphetamines: NOT DETECTED
Barbiturates: NOT DETECTED
Benzodiazepines: POSITIVE — AB
Cocaine: NOT DETECTED
Opiates: NOT DETECTED
Tetrahydrocannabinol: NOT DETECTED

## 2020-07-27 LAB — CBC
HCT: 47.5 % — ABNORMAL HIGH (ref 36.0–46.0)
Hemoglobin: 13.2 g/dL (ref 12.0–15.0)
MCH: 23.5 pg — ABNORMAL LOW (ref 26.0–34.0)
MCHC: 27.8 g/dL — ABNORMAL LOW (ref 30.0–36.0)
MCV: 84.7 fL (ref 80.0–100.0)
Platelets: 376 10*3/uL (ref 150–400)
RBC: 5.61 MIL/uL — ABNORMAL HIGH (ref 3.87–5.11)
RDW: 20.8 % — ABNORMAL HIGH (ref 11.5–15.5)
WBC: 7.9 10*3/uL (ref 4.0–10.5)
nRBC: 0.3 % — ABNORMAL HIGH (ref 0.0–0.2)

## 2020-07-27 LAB — BASIC METABOLIC PANEL
Anion gap: 12 (ref 5–15)
BUN: 39 mg/dL — ABNORMAL HIGH (ref 8–23)
CO2: 31 mmol/L (ref 22–32)
Calcium: 9.4 mg/dL (ref 8.9–10.3)
Chloride: 94 mmol/L — ABNORMAL LOW (ref 98–111)
Creatinine, Ser: 2.31 mg/dL — ABNORMAL HIGH (ref 0.44–1.00)
GFR, Estimated: 23 mL/min — ABNORMAL LOW (ref >60)
Glucose, Bld: 129 mg/dL — ABNORMAL HIGH (ref 70–99)
Potassium: 4.2 mmol/L (ref 3.5–5.1)
Sodium: 137 mmol/L (ref 135–145)

## 2020-07-27 LAB — TROPONIN I (HIGH SENSITIVITY): Troponin I (High Sensitivity): 52 ng/L — ABNORMAL HIGH (ref <18)

## 2020-07-27 LAB — HIV ANTIBODY (ROUTINE TESTING W REFLEX): HIV Screen 4th Generation wRfx: NONREACTIVE

## 2020-07-27 MED ORDER — ALPRAZOLAM 0.5 MG PO TABS
0.5000 mg | ORAL_TABLET | Freq: Three times a day (TID) | ORAL | Status: DC | PRN
  Administered 2020-07-27 – 2020-08-01 (×10): 0.5 mg via ORAL
  Filled 2020-07-27 (×12): qty 1

## 2020-07-27 MED ORDER — TECHNETIUM TO 99M ALBUMIN AGGREGATED
4.1500 | Freq: Once | INTRAVENOUS | Status: AC | PRN
  Administered 2020-07-27: 4.15 via INTRAVENOUS

## 2020-07-27 MED ORDER — ADULT MULTIVITAMIN W/MINERALS CH
1.0000 | ORAL_TABLET | Freq: Every day | ORAL | Status: DC
  Administered 2020-07-27 – 2020-08-01 (×6): 1 via ORAL
  Filled 2020-07-27 (×6): qty 1

## 2020-07-27 NOTE — Progress Notes (Signed)
RT NOTES: Sats 85% on 5L Nasal cannula. Placed on 8L [place] HFNC. Will continue to monitor.

## 2020-07-27 NOTE — Plan of Care (Signed)

## 2020-07-27 NOTE — Consult Note (Signed)
Consultation Note Date: 07/27/2020   Patient Name: [name]  DOB: July 12, 1955  MRN: [id number]  Age / Sex: 65 y.o., female  PCP: [name], [name], MD Referring Physician: Patrecia Pour, MD  Reason for Consultation: Establishing goals of care, Non pain symptom management, and Pain control To address and discuss complex medical decision making with patient and family.   HPI/Patient Profile: 65 y.o. female  with past medical history of significant of chronic pain; continued tobacco use; COPD on home O2; HTN; HLD; hypothyroidism; uterine cancer; anxiety; depression and PVD who was admitted on 07/26/2020 with SOB/Hypoxia. Patient was seen in outpatient  rehab (Dr. Dagoberto Ligas)  on 6/22/202 and was found to be hypoxic and not wearing her O2. Patient was sent by ambulance to the hospital and put on BiPap. Husband reported she had not been wearing her O2 at home and haot been eating well lately, except for drinking mountain dew. Social history is signficant for her friend recently passing away and she has been experiencing significant depression because of it. Upon admission patient was found to be in acute on chronic respiratory failure with hypoxia and hypercapnia. Patient had also been diagnosed with CHF years ago. Palliative care was consulted to discus goals of care, symptom management, and to discuss complex medical decision making.    Clinical Assessment and Goals of Care: I have reviewed medical records including EPIC notes, labs and imaging, received report from RN, assessed the patient and then met at the bedside along with Husband, [name], to discuss diagnosis prognosis, GOC, EOL wishes, disposition and options.  I introduced Palliative Medicine as specialized medical care for people living with serious illness. It focuses on providing relief from the symptoms and stress of a serious illness. The goal is  to improve quality of life for both the patient and the family.  We discussed a brief life review of the patient and then focused on their current illness. The natural disease trajectory and expectations at EOL were discussed. Ms. Conerly, who likes to go by "[name]," and her husband have 2 kids (son [name] and daughter [name]) and 3 grandchildren (ages 72, 37, and 49). [name] is witty and full of personality. She and [name] met when they were teenagers, married for 3 years, divorced for 30 years, and then remarried for 11 years.   Unfortunately, Ms. Figgs had to be taken down for some testing during consultation. Prior to her leaving, we asked who would make decisions for her if she couldn't. She confirmed that her husband, [name], should be the one to make decisions on her behalf. [name] then went on to say, "if she has a 50/50 chance at something, I'm going to go for it."   PMT spoke briefly with her husband, [name], regarding her medical conditions and what brought her to the hospital.  [name] feels that her depression and her lack of willpower landed her in the hospital. He states he feels like she was simply "shutting down and wanting to die" since her friend  had recently passed. He reports she has struggled with depression, and he doesn't like to push her when she is feeling down; however, that gets difficult sometimes. He states she recently stopped cleaning up the house and doing other things around the house, mostly likely secondary to both depression and difficulty breathing. He states she mostly would lay down on the couch and sleep for most of the day. PMT then addressed Ms. Scheper's medical conditions. [name] understands that [name]'s heart, lungs, and kidneys are damaged. He recognizes medicine cannot fix her problems but only slow them down.   I addressed that a follow-up meeting will take place to have a further conversation about goals of care and symptom management with Ms. Bruster.    Advanced directives, concepts specific to code status, artifical feeding and hydration, and rehospitalization were not addressed at this time, but will be in the future if Ms. Bessire is willing. Plan is to have Ms. Veillon complete an Forensic scientist and MOST form during our next follow-up.   Discussed the importance of continued conversation with family and the medical providers regarding overall plan of care and treatment options, ensuring decisions are within the context of the patient's values and GOCs.    Questions and concerns were addressed. The family was encouraged to call with questions or concerns.  PMT will continue to support holistically.    Primary Decision Maker:  PATIENT    SUMMARY OF RECOMMENDATIONS    Code Status/Advance Care Planning: Full Code - not yet discussed.   Symptom Management:  Seeing Dr. Alveta Heimlich for pain management  Add small fan in room to aid with COPD  Patient's symptoms of depression and poor PO being addressed by the primary team.  [facility]ze for protein supplementation and loss of appetite  Would recommend an Advance Directive and MOST form be completed when patient is available.  Additional Recommendations (Limitations, Scope, Preferences): Full Scope Treatment  Palliative Prophylaxis:  Symptom management and support as needed   Psycho-social/Spiritual:  Desire for further Chaplaincy support: not yet discussed.  Prognosis:  Unable to determine   Discharge Planning: To Be Determined Anticipate home with home health      Primary Diagnoses: Present on Admission:  Acute on chronic respiratory failure with hypoxia and hypercapnia (HCC)  Hypothyroid  Cigarette smoker  Chronic pain  Anxiety and depression  Acute kidney injury ([place])  COPD  GOLD II / still smoking   Idiopathic hypotension  Acute on chronic combined systolic and diastolic CHF (congestive heart failure) ([place])   I have reviewed the medical record, interviewed  the patient and family, and examined the patient. The following aspects are pertinent.  Past Medical History:  Diagnosis Date   Anemia    Anxiety and depression    Bronchitis    Chronic pain    COPD (chronic obstructive pulmonary disease) (HCC)    Gastroesophageal reflux disease    History of cardiac catheterization    Normal coronary arteries 2015   Hyperlipidemia    Hypertension    Hypothyroid    Migraine headache    Near syncope    On home oxygen therapy    Started 08/2018   Peripheral vascular disease ([place])    Pneumonia    Tobacco abuse    1/2 pack per day   Uterine cancer [facility])    Social History   Socioeconomic History   Marital status: Married    Spouse name: Not on file   Number of children: 2   Years  of education: Not on file   Highest education level: Not on file  Occupational History    Comment: Disabled  Tobacco Use   Smoking status: Every Day    Packs/day: 0.25    Years: 40.00    Pack years: 10.00    Types: Cigarettes    Start date: 09/06/1972   Smokeless tobacco: Never   Tobacco comments:    2 cigarettes per day-12/06/2019  Vaping Use   Vaping Use: Never used  Substance and Sexual Activity   Alcohol use: No    Alcohol/week: 0.0 standard drinks   Drug use: No   Sexual activity: Not Currently    Birth control/protection: Surgical  Other Topics Concern   Not on file  Social History Narrative   Not on file   Social Determinants of Health   Financial Resource Strain: Not on file  Food Insecurity: Not on file  Transportation Needs: Not on file  Physical Activity: Not on file  Stress: Not on file  Social Connections: Not on file   Family History  Problem Relation Age of Onset   Colon polyps Mother    Deep vein thrombosis Mother    Heart disease Mother    Hypertension Mother    Bleeding Disorder Mother    Hypertension Sister    Heart disease Brother        before age 70   Hypertension Brother     Allergies  Allergen Reactions    Mirtazapine Other (See Comments)    halucinations   Acetaminophen-Codeine Nausea And Vomiting   Aspirin Nausea And Vomiting    (can take coated aspirin)   Astelin [Azelastine]    Bactrim [Sulfamethoxazole-Trimethoprim]    Celecoxib Other (See Comments)    "does the opposite of what it is suppose to do"   Ciprofloxacin    Fentanyl    Levaquin [Levofloxacin In D5w] Itching   Lipitor [Atorvastatin] Other (See Comments)    Myalgias    Neomycin    Salicylates    Sulfa Antibiotics Nausea And Vomiting   Adhesive [Tape] Rash   Latex Rash   Tegretol [Carbamazepine] Rash     Vital Signs: BP 105/60   Pulse 84   Temp 98.3 F (36.8 C) (Oral)   Resp 15   Ht '5\' 2"'  (1.575 m)   Wt 53.3 kg   SpO2 94%   BMI 21.47 kg/m  Pain Scale: 0-10   Pain Score: 7    SpO2: SpO2: 94 % O2 Device:SpO2: 94 % O2 Flow Rate: .O2 Flow Rate (L/min): 5 L/min    Palliative Assessment/Data: 60% PPS      Time In: 4:00 Time Out: 5:00 Time Total: 60 mins  Visit consisted of counseling and education dealing with the complex and emotionally intense issues surrounding the need for palliative care and symptom management in the setting of serious and potentially life-threatening illness. Greater than 50%  of this time was spent counseling and coordinating care related to the above assessment and plan.  Signed by: Florentina Jenny, PA-C Palliative Medicine Demetrios Isaacs, PA-S2  Please contact Palliative Medicine Team phone at [phone] for questions and concerns.  For individual provider: See Shea Evans

## 2020-07-27 NOTE — Progress Notes (Signed)
Initial Nutrition Assessment  DOCUMENTATION CODES:  Not applicable  INTERVENTION:  RD to add double portions at meals.  Add Magic cup TID with meals, each supplement provides 290 kcal and 9 grams of protein.  Add MVI with minerals daily.  NUTRITION DIAGNOSIS:  Inadequate oral intake related to poor appetite, social / environmental circumstances (depression) as evidenced by per patient/family report.  GOAL:  Patient will meet greater than or equal to 90% of their needs  MONITOR:  PO intake, Supplement acceptance, Labs, Weight trends, I & O's  REASON FOR ASSESSMENT:  Consult Diet education  ASSESSMENT:  65 yo female with a PMH of 3L O2 dependence, COPD, HTN, HLD, chronic pain under pain management, hypothyroidism, PVD, and uterine cancer who presents with acute on chronic respiratory failure with hypoxia and hypercapnia.  RD working remotely.  Spoke with pt on the phone. Pt reports she has been eating very little, and mostly drinking [facility] for the last 3-4 weeks. She endorses feeling depressed and not up to eating during this time. Now, pt reports that she has her appetite back, saying she's "about to starve." She is very hungry, so RD offered double portions at meals. She is agreeable.  Pt reports some weight loss, but is unsure of the exact amount. Per Epic, pt's weight has been stable the past 10 months until 04/2020. Weight loss of ~7 lbs (5.5%) in the past 3.5 months, which is significant and severe for the time frame.  Suspect pt is malnourished but unable to diagnose definitively at this time.  Recommend double portions at meals, Magic Cup TID, and MVI with minerals daily.  Medications: reviewed; colace BID, Lasix BID, Synthroid, Solu-Medrol BID, prednisone, Remeron, Protonix, Xanax PRN (given once today), oxycodone PRN (given once today)  Labs: reviewed; Glucose 129 (H), ionized Ca 1.13 (L)  NUTRITION - FOCUSED PHYSICAL EXAM: Unable to perform  Diet Order:    Diet Order             Diet regular Room service appropriate? Yes; Fluid consistency: Thin  Diet effective ____                  EDUCATION NEEDS:  Education needs have been addressed  Skin:  Skin Assessment: Reviewed RN Assessment  Last BM:  PTA/unknown  Height:  Ht Readings from Last 1 Encounters:  07/26/20 5\' 2"  (1.575 m)   Weight:  Wt Readings from Last 1 Encounters:  07/27/20 53.3 kg   Ideal Body Weight:  50 kg  BMI:  Body mass index is 21.47 kg/m.  Estimated Nutritional Needs:  Kcal:  1700-1900 Protein:  75-90 grams Fluid:  >1.7 L  Derrel Nip, RD, LDN (she/her/hers) Registered Dietitian I After-Hours/Weekend Pager # in [place]

## 2020-07-27 NOTE — Progress Notes (Signed)
Tele called and said Pt monitor reading ST depression in lead 2 -3.9, and wide QRS continuously, this is a change from this mornings readings. Into room to assess pt. All leads checked and in place. Pt. Denied any chest pain, shortness of breath, or dizziness, pt stated she just felt bad in general. MD made aware. ECG ordered and obtain, reading SR with sinus arrhythmia. MD made aware, see new orders.

## 2020-07-27 NOTE — Progress Notes (Signed)
PROGRESS NOTE  [name]  WGN:[id number] DOB: 24-Mar-1955 DOA: 07/26/2020 PCP: [name], [name], MD   Brief Narrative: [name] is a 65 y.o. female with a history of 3L O2-dependent COPD, HTN, HLD, chronic pain under pain management, hypothyroidism, PVD and uterine cancer who was sent to the ED 6/22 from pain management/PM&R due to hypoxia while not wearing her prescribed oxygen. She's been more depressed over the past few months due to multiple friends/family deaths, eating very little, just drinking [facility], noticing 2-3 weeks of worsening leg swelling, and continuing to smoke 1 ppd. In the ED hypoxic and hypercapnic respiratory failure was confirmed by ABG, started on BiPAP. BNP elevated to 3,606, d-dimer 2.0, creatinine 2.9, CXR with bibasilar scarring, cardiomegaly, emphysema and no infiltrate. Steroids, breathing treatments, and lasix were given for COPD exacerbation and acute CHF.  Assessment & Plan: Principal Problem:   Acute on chronic respiratory failure with hypoxia and hypercapnia (HCC) Active Problems:   Hypothyroid   Cigarette smoker   Anxiety and depression   Chronic pain   Acute kidney injury ([place])   COPD  GOLD II / still smoking    Idiopathic hypotension   Acute on chronic combined systolic and diastolic CHF (congestive heart failure) (HCC)  Acute on chronic hypoxic and hypercarbic respiratory failure:  - Continue treating CHF and COPD as below.  - Wean oxygen as tolerated with target of 3L O2 (baseline). Target SpO2 88-95% with normal respiratory effort.   Acute on chronic HFpEF: Hx LVEF 50-55%, and impaired relaxation. - Continue IV lasix 40mg  BID, still with some crackles, LE edema.  - Echocardiogram pending.  - Strict I/O, daily weights.  - Heart healthy diet. RD consulted - GDMT may be limited by hypotension and kidney impairment.   COPD with exacerbation:  - Continue steroids, ok to convert to po today with no wheezing. Very  diminished throughout owing to the severity of emphysema.  - Continue inhaled bronchodilators  AKI: Presumed, though baseline of 1-1.3 is based on data from 2020-2021. Improving, suggestive of renal congestion as etiology.  - Continue monitoring with diuresis.  - Avoid nephrotoxins.   Tobacco use:  - Cessation counseling, nicotine patch ordered.  Chronic pain:  - No changes to home medications  Hypotension: Continue midodrine  Hypothyroidism:  - Continue synthroid   Depression, anxiety with situational exacerbation/adjustment/grief reactions:  - Continue home medications, despite patient being at high risk for polypharmacy, will not change chronic regimen. - Will attempt to arrange outpatient psychiatry follow up as patient would benefit from ongoing psychotherapy, possible medical management.  - Palliative care was consulted  DVT prophylaxis: Lovenox Code Status: Full Family Communication: None at bedside Disposition Plan:  Status is: Inpatient  Remains inpatient appropriate because:Inpatient level of care appropriate due to severity of illness  Dispo: The patient is from: Home              Anticipated d/c is to: Home              Patient currently is not medically stable to d/c.   Difficult to place patient No  Consultants:  None  Procedures:  None  Antimicrobials: None   Subjective: Breathing is better, still some swelling. She's hungry for the first time in a while. No chest pain or palpitations. Weaned off BiPAP and on O2, comfortable now.  Objective: Vitals:   07/27/20 0237 07/27/20 0330 07/27/20 0748 07/27/20 0754  BP: 105/64 105/64 (!) 105/53   Pulse: 76  73 80   Resp: 16 16 14    Temp: 97.8 F (36.6 C)  97.9 F (36.6 C)   TempSrc: Oral  Oral   SpO2: 97% 97% 91%   Weight:    53.3 kg  Height:        Intake/Output Summary (Last 24 hours) at 07/27/2020 4268 Last data filed at 07/26/2020 2255 Gross per 24 hour  Intake --  Output 1000 ml  Net -1000  ml   Filed Weights   07/26/20 1226 07/27/20 0754  Weight: 47.6 kg 53.3 kg    Gen: 65 y.o. chronically ill-appearing female in no distress Pulm: Non-labored breathing supplemental oxygen. Crackles at bases and otherwise VERY diminished globally.  CV: Regular rate and rhythm. No murmur, rub, or gallop. No JVD, + pedal edema. GI: Abdomen soft, non-tender, non-distended, with normoactive bowel sounds. No organomegaly or masses felt. Ext: Warm, no deformities Skin: No rashes, lesions or ulcers on visualized skin Neuro: Alert and oriented. No focal neurological deficits. Psych: Judgement and insight appear normal. Mood & affect appropriate.   Data Reviewed: I have personally reviewed following labs and imaging studies  CBC: Recent Labs  Lab 07/26/20 1329 07/26/20 1335 07/27/20 0210  WBC 10.7*  --  7.9  NEUTROABS 8.1*  --   --   HGB 12.6 15.6* 13.2  HCT 45.0 46.0 47.5*  MCV 85.1  --  84.7  PLT 355  --  341   Basic Metabolic Panel: Recent Labs  Lab 07/26/20 1329 07/26/20 1335 07/27/20 0210  NA 137 135 137  K 4.0 4.0 4.2  CL 93*  --  94*  CO2 30  --  31  GLUCOSE 107*  --  129*  BUN 44*  --  39*  CREATININE 2.90*  --  2.31*  CALCIUM 9.0  --  9.4   GFR: Estimated Creatinine Clearance: 19.7 mL/min (A) (by C-G formula based on SCr of 2.31 mg/dL (H)). Liver Function Tests: Recent Labs  Lab 07/26/20 1329  AST 39  ALT 30  ALKPHOS 135*  BILITOT 2.1*  PROT 6.5  ALBUMIN 3.3*   No results for input(s): LIPASE, AMYLASE in the last 168 hours. No results for input(s): AMMONIA in the last 168 hours. Coagulation Profile: No results for input(s): INR, PROTIME in the last 168 hours. Cardiac Enzymes: No results for input(s): CKTOTAL, CKMB, CKMBINDEX, TROPONINI in the last 168 hours. BNP (last 3 results) No results for input(s): PROBNP in the last 8760 hours. HbA1C: No results for input(s): HGBA1C in the last 72 hours. CBG: No results for input(s): GLUCAP in the last 168  hours. Lipid Profile: No results for input(s): CHOL, HDL, LDLCALC, TRIG, CHOLHDL, LDLDIRECT in the last 72 hours. Thyroid Function Tests: No results for input(s): TSH, T4TOTAL, FREET4, T3FREE, THYROIDAB in the last 72 hours. Anemia Panel: No results for input(s): VITAMINB12, FOLATE, FERRITIN, TIBC, IRON, RETICCTPCT in the last 72 hours. Urine analysis:    Component Value Date/Time   COLORURINE YELLOW 06/11/2019 1536   APPEARANCEUR HAZY (A) 06/11/2019 1536   LABSPEC 1.008 06/11/2019 1536   PHURINE 6.0 06/11/2019 1536   GLUCOSEU NEGATIVE 06/11/2019 1536   HGBUR NEGATIVE 06/11/2019 1536   BILIRUBINUR NEGATIVE 06/11/2019 1536   KETONESUR NEGATIVE 06/11/2019 1536   PROTEINUR 30 (A) 06/11/2019 1536   UROBILINOGEN 0.2 02/21/2012 0005   NITRITE NEGATIVE 06/11/2019 1536   LEUKOCYTESUR NEGATIVE 06/11/2019 1536   Recent Results (from the past 240 hour(s))  Resp Panel by RT-PCR (Flu A&B, Covid) Nasopharyngeal Swab  Status: None   Collection Time: 07/26/20  1:29 PM   Specimen: Nasopharyngeal Swab; Nasopharyngeal(NP) swabs in vial transport medium  Result Value Ref Range Status   SARS Coronavirus 2 by RT PCR NEGATIVE NEGATIVE Final    Comment: (NOTE) SARS-CoV-2 target nucleic acids are NOT DETECTED.  The SARS-CoV-2 RNA is generally detectable in upper respiratory specimens during the acute phase of infection. The lowest concentration of SARS-CoV-2 viral copies this assay can detect is 138 copies/mL. A negative result does not preclude SARS-Cov-2 infection and should not be used as the sole basis for treatment or other patient management decisions. A negative result may occur with  improper specimen collection/handling, submission of specimen other than nasopharyngeal swab, presence of viral mutation(s) within the areas targeted by this assay, and inadequate number of viral copies(<138 copies/mL). A negative result must be combined with clinical observations, patient history, and  epidemiological information. The expected result is Negative.  Fact Sheet for Patients:  EntrepreneurPulse.com.au  Fact Sheet for Healthcare Providers:  IncredibleEmployment.be  This test is no t yet approved or cleared by the [place] FDA and  has been authorized for detection and/or diagnosis of SARS-CoV-2 by FDA under an Emergency Use Authorization (EUA). This EUA will remain  in effect (meaning this test can be used) for the duration of the COVID-19 declaration under Section 564(b)(1) of the Act, 21 U.S.C.section 360bbb-3(b)(1), unless the authorization is terminated  or revoked sooner.       Influenza A by PCR NEGATIVE NEGATIVE Final   Influenza B by PCR NEGATIVE NEGATIVE Final    Comment: (NOTE) The Xpert Xpress SARS-CoV-2/FLU/RSV plus assay is intended as an aid in the diagnosis of influenza from Nasopharyngeal swab specimens and should not be used as a sole basis for treatment. Nasal washings and aspirates are unacceptable for Xpert Xpress SARS-CoV-2/FLU/RSV testing.  Fact Sheet for Patients: EntrepreneurPulse.com.au  Fact Sheet for Healthcare Providers: IncredibleEmployment.be  This test is not yet approved or cleared by the [place] FDA and has been authorized for detection and/or diagnosis of SARS-CoV-2 by FDA under an Emergency Use Authorization (EUA). This EUA will remain in effect (meaning this test can be used) for the duration of the COVID-19 declaration under Section 564(b)(1) of the Act, 21 U.S.C. section 360bbb-3(b)(1), unless the authorization is terminated or revoked.  Performed at [facility], [place] [address] [place], [place] [address]       Radiology Studies: DG Chest Port 1 View  Result Date: 07/26/2020 CLINICAL DATA:  Dyspnea EXAM: PORTABLE CHEST 1 VIEW COMPARISON:  06/11/2019 FINDINGS: Cardiomegaly. Scarring and or atelectasis at the bilateral lung bases.  Emphysema. The visualized skeletal structures are unremarkable. IMPRESSION: 1.  Cardiomegaly. 2. Scarring and or atelectasis at the bilateral lung bases without acute appearing airspace opacity. 3.  Emphysema. Electronically Signed   By: Eddie Candle M.D.   On: 07/26/2020 13:48    Scheduled Meds:  aspirin EC  81 mg Oral Daily   docusate sodium  100 mg Oral BID   DULoxetine  60 mg Oral BID   enoxaparin (LOVENOX) injection  30 mg Subcutaneous Q24H   furosemide  40 mg Intravenous BID   ipratropium-albuterol  3 mL Nebulization Q6H   levothyroxine  50 mcg Oral QAC breakfast   methylPREDNISolone (SOLU-MEDROL) injection  60 mg Intravenous Q12H   Followed by   predniSONE  40 mg Oral Q breakfast   midodrine  5 mg Oral TID WC   mirtazapine  15 mg Oral QHS  nicotine  21 mg Transdermal Daily   pantoprazole  40 mg Oral BID AC   pregabalin  50 mg Oral TID   QUEtiapine  50-100 mg Oral QHS   sodium chloride flush  3 mL Intravenous Q12H   Continuous Infusions:   LOS: 1 day   Time spent: 35 minutes.  Patrecia Pour, MD [facility] [email or web address] 07/27/2020, 8:39 AM

## 2020-07-27 NOTE — Consult Note (Addendum)
Cardiology Consultation:   Patient ID: [name] MRN: [id number]; DOB: 03-Dec-1955  Admit date: 07/26/2020 Date of Consult: 07/27/2020  PCP:  [name], [name], MD   [facility] Providers Cardiologist:  Rozann Lesches, MD   {   Patient Profile:   [name] is a 65 y.o. female with a hx of oxygen dependent COPD,  tobacco abuse, chronic diastolic heart failure, hypotension, orthostasis, HLD, chronic pain syndrome, depression, anxiety,  hypothyroidism, PVD, carotid artery disease, left subclavian artery stenosis, who is being seen 07/27/2020 for the evaluation of CHF at the request of Dr. Bonner Puna.     History of Present Illness:   Ms. Barrack followed Dr Bronson Ing outpatient previously. She had complaints of chest pain and syncope in 2014, underwent a dobutamine Cardiolite stress test 01/2013 which revealed anteroseptal infarct with a mild degree of peri-infarct ischemia, EF 50%.  Subsequently she had a left heart catheterization on 07/12/2013 which revealed no angiographic CAD, normal LV systolic function.  She was not tolerating atorvastatin due to significant myalgia.  She was not treated with beta-blocker for HTN due to COPD.  Last echo from 08/25/2018 revealed EF 50 to 55%, moderately increased LV wall thickness, diastolic dysfunction, RV normal.  Mildly dilated LA and RA, no significant valvular disease.   She was lost to follow-up with cardiology with cardiology since 2015, last seen by Dr. Domenic Polite in the office on 03/01/2020 for orthostatic hypotension.  Orthostatic measurement was normal.  She was found to be on midodrine, and felt orthostasis is partially due to autonomic dysfunction along with polypharmacy from psychiatric medication and opioid use.  Carotid ultrasound was repeated due to known mild to moderate bilateral carotid stenosis and left subclavian steal, Doppler on 03/10/2020 revealed no high-grade stenosis bilaterally.  She was recommended consider  reinitiate statin with Crestor.   She presented to ER 07/26/2020 with c/o of SOB and hypoxia while not wearing her oxygen at rehab, was found confused at admission.  Her husband had reported that she has not been having adequate oral intake over the last several days, has been drinking a lots of [facility], has has been refusing to wear her oxygen and rarely leaves the house.  Patient has been severely depressed due to loss of a friend 2 months ago.  She continues to smoke 4 cigarettes daily. She states she has been intermittently short of breath with exertion, however able to tolerate ambulation and ADLs.  She did not feel like she needs the oxygen.  She had noted bilateral lower extremity swelling for a few weeks.  She reports intermittent hematuria, denied urinary pain or frequency.  She reports over the past few weeks, she had experienced occasional mid sternal/epigastric pain, that last minutes and resolve spontaneously.  Pain occurs randomly, triggered by exertion nor relieved by resting.  Pain sometimes radiating down the left arm.   Admission diagnostic revealed BUN 44, creatinine 2.9, GFR 18, total bilirubin 2.1, alk phos 135.  Ionized calcium 1.13.  BNP 3606.  High sensitive troponin 78 >72.  CBC with mild leukocytosis 10, 700, hemoglobin 12.6, platelet 355k.  D-dimer elevated 2.  COVID and flu negative.  HIV negative.  Ethanol <10. Chest x-ray revealed cardiomegaly, scarring or atelectasis at bilateral lung base with no apparent acute airspace opacity, emphysema.  She required BiPAP support at ER.  She is subsequently admitted to hospital medicine team, felt respiratory failure is due to COPD and decompensated CHF.  She was started on IV Lasix 40  mg twice daily and COPD exacerbation treatment.  Repeat echocardiogram completed today revealed LVEF 30 to 35%, moderately decreased LV function, LV global hypokinesis, moderate LVH, grade 1 DD, RV normal, mildly dilated LA, no significant valvular disease.   Cardiology is consulted for further input.    Past Medical History:  Diagnosis Date   Anemia    Anxiety and depression    Bronchitis    Chronic pain    COPD (chronic obstructive pulmonary disease) (HCC)    Gastroesophageal reflux disease    History of cardiac catheterization    Normal coronary arteries 2015   Hyperlipidemia    Hypertension    Hypothyroid    Migraine headache    Near syncope    On home oxygen therapy    Started 08/2018   Peripheral vascular disease ([place])    Pneumonia    Tobacco abuse    1/2 pack per day   Uterine cancer [facility])     Past Surgical History:  Procedure Laterality Date   ABDOMINAL HYSTERECTOMY     without oophorectomy for neoplastic disease   BACK SURGERY     BIOPSY  10/22/2018   Procedure: BIOPSY;  Surgeon: Danie Binder, MD;  Location: [facility];  Service: Endoscopy;;  duodenal, gastric   CARDIAC CATHETERIZATION  07/2013   normal coronary arteries   CARPAL TUNNEL RELEASE     Left   CHOLECYSTECTOMY  2007   DILATION AND CURETTAGE OF UTERUS     ESOPHAGOGASTRODUODENOSCOPY (EGD) WITH PROPOFOL N/A 10/22/2018   gastritis (mild reactive gastropathy, negative duodenal biopsies for celiac).    LAPAROTOMY     LEFT HEART CATHETERIZATION WITH CORONARY ANGIOGRAM N/A 07/12/2013   Procedure: LEFT HEART CATHETERIZATION WITH CORONARY ANGIOGRAM;  Surgeon: Leonie Man, MD;  Location: [facility] CATH LAB;  Service: Cardiovascular;  Laterality: N/A;   LUMBAR LAMINECTOMY  x3   Left; complicated by neurologic dysfunction   NASAL SINUS SURGERY       Home Medications:  Prior to Admission medications   Medication Sig Start Date End Date Taking? Authorizing Provider  albuterol (PROAIR HFA) 108 (90 Base) MCG/ACT inhaler Inhale 2 puffs into the lungs every 4 (four) hours as needed for wheezing or shortness of breath (cough). 12/06/19  Yes Tanda Rockers, MD  albuterol (PROVENTIL) (2.5 MG/3ML) 0.083% nebulizer solution Take 3 mLs (2.5 mg total) by nebulization every 4  (four) hours as needed for wheezing or shortness of breath (cough). 08/27/18  Yes Emokpae, Courage, MD  ALPRAZolam Duanne Moron) 0.5 MG tablet Take 1 tablet (0.5 mg total) by mouth 2 (two) times daily as needed. for anxiety 03/20/20  Yes Lovorn, Megan, MD  aspirin EC 81 MG tablet Take 81 mg by mouth daily.   Yes [provider]  DULoxetine (CYMBALTA) 60 MG capsule Take 1 capsule (60 mg total) by mouth 2 (two) times daily. 04/07/20  Yes Lovorn, Jinny Blossom, MD  fluticasone (FLONASE) 50 MCG/ACT nasal spray Place 1-2 sprays into both nostrils daily as needed for allergies.    Yes [provider]  levothyroxine (SYNTHROID) 50 MCG tablet Take 1 tablet (50 mcg total) by mouth daily before breakfast. 04/07/20  Yes Lovorn, Jinny Blossom, MD  midodrine (PROAMATINE) 5 MG tablet Take 1 tablet (5 mg total) by mouth 3 (three) times daily with meals. 04/07/20  Yes Lovorn, Megan, MD  mirtazapine (REMERON) 15 MG tablet TAKE (1) TABLET BY MOUTH AT BEDTIME. Patient taking differently: Take 15 mg by mouth at bedtime. 04/18/20  Yes Lovorn, Jinny Blossom, MD  OVER THE COUNTER MEDICATION Take 1 capsule by mouth daily. Amberen Multi-Symptom Menopause Relief   Yes [provider]  oxyCODONE (OXY IR/ROXICODONE) 5 MG immediate release tablet TAKE (1) TABLET BY MOUTH EVERY SIX HOURS AS NEEDED FOR SEVERE PAIN. Patient taking differently: Take 5 mg by mouth every 6 (six) hours as needed for severe pain. 07/18/20  Yes Lovorn, Jinny Blossom, MD  pantoprazole (PROTONIX) 40 MG tablet TAKE ONE TABLET BY MOUTH TWICE DAILY BEFORE A MEAL. Patient taking differently: Take 40 mg by mouth 2 (two) times daily. 01/19/20  Yes Annitta Needs, NP  pregabalin (LYRICA) 50 MG capsule Take 1 capsule (50 mg total) by mouth 3 (three) times daily. Actually 1 in Am and 2 at night- for nerve pain Patient taking differently: Take 50-100 mg by mouth See admin instructions. 50 mg in the morning  100 mg at bedtime 04/07/20  Yes Lovorn, Jinny Blossom, MD  QUEtiapine (SEROQUEL) 50 MG  tablet Take 1-2 tablets (50-100 mg total) by mouth at bedtime. 01/07/20  Yes Lovorn, Jinny Blossom, MD  UNABLE TO FIND Med Name: OTC Amberen (for hot flashes)   Yes [provider]    Inpatient Medications: Scheduled Meds:  docusate sodium  100 mg Oral BID   DULoxetine  60 mg Oral BID   enoxaparin (LOVENOX) injection  30 mg Subcutaneous Q24H   furosemide  40 mg Intravenous BID   levothyroxine  50 mcg Oral QAC breakfast   methylPREDNISolone (SOLU-MEDROL) injection  60 mg Intravenous Q12H   Followed by   predniSONE  40 mg Oral Q breakfast   midodrine  5 mg Oral TID WC   mirtazapine  15 mg Oral QHS   multivitamin with minerals  1 tablet Oral Daily   nicotine  21 mg Transdermal Daily   pantoprazole  40 mg Oral BID AC   pregabalin  50 mg Oral TID   QUEtiapine  50-100 mg Oral QHS   sodium chloride flush  3 mL Intravenous Q12H   Continuous Infusions:  PRN Meds: acetaminophen **OR** acetaminophen, albuterol, ALPRAZolam, bisacodyl, ondansetron **OR** ondansetron (ZOFRAN) IV, oxyCODONE, polyethylene glycol  Allergies:    Allergies  Allergen Reactions   Mirtazapine Other (See Comments)    halucinations   Acetaminophen-Codeine Nausea And Vomiting   Aspirin Nausea And Vomiting    (can take coated aspirin)   Astelin [Azelastine]    Bactrim [Sulfamethoxazole-Trimethoprim]    Celecoxib Other (See Comments)    "does the opposite of what it is suppose to do"   Ciprofloxacin    Fentanyl    Levaquin [Levofloxacin In D5w] Itching   Lipitor [Atorvastatin] Other (See Comments)    Myalgias    Neomycin    Salicylates    Sulfa Antibiotics Nausea And Vomiting   Adhesive [Tape] Rash   Latex Rash   Tegretol [Carbamazepine] Rash    Social History:   Social History   Socioeconomic History   Marital status: Married    Spouse name: Not on file   Number of children: 2   Years of education: Not on file   Highest education level: Not on file  Occupational History    Comment: Disabled   Tobacco Use   Smoking status: Every Day    Packs/day: 0.25    Years: 40.00    Pack years: 10.00    Types: Cigarettes    Start date: 09/06/1972   Smokeless tobacco: Never   Tobacco comments:    2 cigarettes per day-12/06/2019  Vaping Use   Vaping Use: Never  used  Substance and Sexual Activity   Alcohol use: No    Alcohol/week: 0.0 standard drinks   Drug use: No   Sexual activity: Not Currently    Birth control/protection: Surgical  Other Topics Concern   Not on file  Social History Narrative   Not on file   Social Determinants of Health   Financial Resource Strain: Not on file  Food Insecurity: Not on file  Transportation Needs: Not on file  Physical Activity: Not on file  Stress: Not on file  Social Connections: Not on file  Intimate Partner Violence: Not on file    Family History:    Family History  Problem Relation Age of Onset   Colon polyps Mother    Deep vein thrombosis Mother    Heart disease Mother    Hypertension Mother    Bleeding Disorder Mother    Hypertension Sister    Heart disease Brother        before age 52   Hypertension Brother      ROS:  Constitutional: depressed Eyes: Denied vision change or loss Ears/Nose/Mouth/Throat: Denied ear ache, sore throat, coughing, sinus pain Cardiovascular: see HPI Respiratory: see HPI  Gastrointestinal: Denied nausea, vomiting, abdominal pain, diarrhea Genital/Urinary: Hematuria  Musculoskeletal: Denied muscle ache, joint pain, weakness Skin: Denied rash, wound Neuro: Denied headache, dizziness, syncope Psych: history of depression/anxiety  Endocrine: Denied history of diabetes   Physical Exam/Data:   Vitals:   07/27/20 0904 07/27/20 1013 07/27/20 1219 07/27/20 1513  BP:   97/62 105/60  Pulse:   82 84  Resp:   14 15  Temp:   98.1 F (36.7 C) 98.3 F (36.8 C)  TempSrc:   Oral Oral  SpO2: 91% 92% 99% 94%  Weight:      Height:        Intake/Output Summary (Last 24 hours) at 07/27/2020  1636 Last data filed at 07/26/2020 2255 Gross per 24 hour  Intake --  Output 1000 ml  Net -1000 ml   Last 3 Weights 07/27/2020 07/26/2020 04/07/2020  Weight (lbs) 117 lb 6.4 oz 105 lb 124 lb 3.2 oz  Weight (kg) 53.252 kg 47.628 kg 56.337 kg     Body mass index is 21.47 kg/m.   Vitals:  Vitals:   07/27/20 1219 07/27/20 1513  BP: 97/62 105/60  Pulse: 82 84  Resp: 14 15  Temp: 98.1 F (36.7 C) 98.3 F (36.8 C)  SpO2: 99% 94%   General Appearance: In no apparent distress, laying in bed, cachectic HEENT: Normocephalic, atraumatic.  Neck: Supple, trachea midline, JVD 8-9cm  Cardiovascular: Regular rate and rhythm, normal S1-S2,  no murmur/rub/gallop Respiratory: Resting breathing unlabored, lungs sounds distant but clear to auscultation bilaterally, no use of accessory muscles. On 5LNC with pox 91%.  No wheezes  Gastrointestinal: Bowel sounds positive, abdomen soft, non-tender, non-distended.  Extremities: Able to move all extremities in bed without difficulty, trace ankle  edema Genitourinary: genital exam not performed Musculoskeletal: Normal muscle bulk and tone, muscle strength 5/5 throughout, no limited range of motion Skin: Intact, warm, dry. No rashes or petechiae noted in exposed areas.  Neurologic: Alert, oriented to person, place and time. Fluent speech, + memory loss, no cognitive deficit, no gross focal neuro deficit Psychiatric: Normal affect. Mood is appropriate.    EKG:  The EKG was personally reviewed and demonstrates:  07/26/20 EKG revealed sinus rhythm with ventricular rate of 87, bigeminy PVCs, old LBBB  Telemetry:  Telemetry was personally reviewed and demonstrates: Sinus  rhythm with arrhythmia, PVCs   Relevant CV Studies:  Echo from 07/27/20:   1. Left ventricular ejection fraction, by estimation, is 30 to 35%. The  left ventricle has moderately decreased function. The left ventricle  demonstrates global hypokinesis. The left ventricular internal cavity size   was mildly dilated. There is moderate   left ventricular hypertrophy. Left ventricular diastolic parameters are  consistent with Grade I diastolic dysfunction (impaired relaxation).  Elevated left atrial pressure.   2. Right ventricular systolic function is normal. The right ventricular  size is normal.   3. Left atrial size was mildly dilated.   4. The mitral valve is normal in structure. No evidence of mitral valve  regurgitation. No evidence of mitral stenosis.   5. The aortic valve has an indeterminant number of cusps. Aortic valve  regurgitation is not visualized. No aortic stenosis is present.   6. The inferior vena cava is normal in size with greater than 50%  respiratory variability, suggesting right atrial pressure of 3 mmHg.  Carotid doppler on 03/10/20:  RIGHT CAROTID ARTERY: Minimal scattered plaque of the carotid bulb without flow-limiting stenosis.   RIGHT VERTEBRAL ARTERY:  Antegrade flow.   LEFT CAROTID ARTERY: Mild heterogeneous plaque of the carotid bulb without high-grade stenosis.   LEFT VERTEBRAL ARTERY:  Antegrade flow.   IMPRESSION: No significant stenosis of internal carotid arteries.   Echo from 08/25/2018:   1. The left ventricle has low normal systolic function, with an ejection  fraction of 50-55%. The cavity size was normal. There is moderately  increased left ventricular wall thickness. Left ventricular diastolic  Doppler parameters are consistent with  impaired relaxation. Elevated mean left atrial pressure.   2. The right ventricle has normal systolic function. The cavity was  normal. There is no increase in right ventricular wall thickness.   3. Left atrial size was mildly dilated.   4. Right atrial size was mildly dilated.   5. No evidence of mitral valve stenosis.   6. No stenosis of the aortic valve.   7. The aortic root is normal in size and structure.   8. Pulmonary hypertension is indeterminant, inadequate TR jet.   Left cardiac cath  07/12/2013:  Left mainstem: There was no left main.  The LAD and LCx have separate ostia.   Left anterior descending (LAD): Separate ostium.  No angiographic CAD.   Left circumflex (LCx): Separate ostium.  No angiographic CAD.   Right coronary artery (RCA): No angiographic CAD.   Left ventriculography: Left ventricular systolic function is normal, LVEF is estimated at 60-65%, there is no significant mitral regurgitation   Final Conclusions:  No angiographic CAD, normal LV systolic function.  Suspect false positive Cardiolite.  Followup with Dr. Jacinta Shoe.    Laboratory Data:  High Sensitivity Troponin:   Recent Labs  Lab 07/26/20 1329 07/26/20 1546 07/27/20 1507  TROPONINIHS 78* 72* 52*     Chemistry Recent Labs  Lab 07/26/20 1329 07/26/20 1335 07/27/20 0210  NA 137 135 137  K 4.0 4.0 4.2  CL 93*  --  94*  CO2 30  --  31  GLUCOSE 107*  --  129*  BUN 44*  --  39*  CREATININE 2.90*  --  2.31*  CALCIUM 9.0  --  9.4  GFRNONAA 18*  --  23*  ANIONGAP 14  --  12    Recent Labs  Lab 07/26/20 1329  PROT 6.5  ALBUMIN 3.3*  AST 39  ALT 30  ALKPHOS 135*  BILITOT 2.1*   Hematology Recent Labs  Lab 07/26/20 1329 07/26/20 1335 07/27/20 0210  WBC 10.7*  --  7.9  RBC 5.29*  --  5.61*  HGB 12.6 15.6* 13.2  HCT 45.0 46.0 47.5*  MCV 85.1  --  84.7  MCH 23.8*  --  23.5*  MCHC 28.0*  --  27.8*  RDW 20.9*  --  20.8*  PLT 355  --  376   BNP Recent Labs  Lab 07/26/20 1329  BNP 3,606.8*    DDimer  Recent Labs  Lab 07/26/20 1329  DDIMER 2.00*     Radiology/Studies:  DG Chest Port 1 View  Result Date: 07/26/2020 CLINICAL DATA:  Dyspnea EXAM: PORTABLE CHEST 1 VIEW COMPARISON:  06/11/2019 FINDINGS: Cardiomegaly. Scarring and or atelectasis at the bilateral lung bases. Emphysema. The visualized skeletal structures are unremarkable. IMPRESSION: 1.  Cardiomegaly. 2. Scarring and or atelectasis at the bilateral lung bases without acute appearing airspace opacity. 3.   Emphysema. Electronically Signed   By: Eddie Candle M.D.   On: 07/26/2020 13:48   ECHOCARDIOGRAM COMPLETE  Result Date: 07/27/2020    ECHOCARDIOGRAM REPORT   Patient Name:   LAINEY NELSON Guest Date of Exam: 07/27/2020 Medical Rec #:  [id number]          Height:       62.0 in Accession #:    [id number]         Weight:       117.4 lb Date of Birth:  16-Apr-1955           BSA:          1.524 m Patient Age:    49 years           BP:           105/53 mmHg Patient Gender: F                  HR:           80 bpm. Exam Location:  Inpatient Procedure: 2D Echo, Cardiac Doppler and Color Doppler Indications:    CHF  History:        Patient has prior history of Echocardiogram examinations, most                 recent 08/25/2018. COPD; Risk Factors:Hypertension, Dyslipidemia                 and Current Smoker.  Sonographer:    Cammy Brochure Referring Phys: Harper  1. Left ventricular ejection fraction, by estimation, is 30 to 35%. The left ventricle has moderately decreased function. The left ventricle demonstrates global hypokinesis. The left ventricular internal cavity size was mildly dilated. There is moderate  left ventricular hypertrophy. Left ventricular diastolic parameters are consistent with Grade I diastolic dysfunction (impaired relaxation). Elevated left atrial pressure.  2. Right ventricular systolic function is normal. The right ventricular size is normal.  3. Left atrial size was mildly dilated.  4. The mitral valve is normal in structure. No evidence of mitral valve regurgitation. No evidence of mitral stenosis.  5. The aortic valve has an indeterminant number of cusps. Aortic valve regurgitation is not visualized. No aortic stenosis is present.  6. The inferior vena cava is normal in size with greater than 50% respiratory variability, suggesting right atrial pressure of 3 mmHg. FINDINGS  Left Ventricle: Left ventricular ejection fraction, by estimation, is 30 to 35%. The left  ventricle has moderately decreased function.  The left ventricle demonstrates global hypokinesis. The left ventricular internal cavity size was mildly dilated. There is moderate left ventricular hypertrophy. Abnormal (paradoxical) septal motion, consistent with left bundle branch block. Left ventricular diastolic parameters are consistent with Grade I diastolic dysfunction (impaired relaxation). Elevated left atrial pressure. Right Ventricle: The right ventricular size is normal. Right ventricular systolic function is normal. Left Atrium: Left atrial size was mildly dilated. Right Atrium: Right atrial size was normal in size. Pericardium: There is no evidence of pericardial effusion. Mitral Valve: The mitral valve is normal in structure. No evidence of mitral valve regurgitation. No evidence of mitral valve stenosis. MV peak gradient, 13.8 mmHg. The mean mitral valve gradient is 5.0 mmHg. Tricuspid Valve: The tricuspid valve is normal in structure. Tricuspid valve regurgitation is trivial. No evidence of tricuspid stenosis. Aortic Valve: The aortic valve has an indeterminant number of cusps. Aortic valve regurgitation is not visualized. No aortic stenosis is present. Aortic valve mean gradient measures 7.5 mmHg. Aortic valve peak gradient measures 14.3 mmHg. Pulmonic Valve: The pulmonic valve was normal in structure. Pulmonic valve regurgitation is not visualized. No evidence of pulmonic stenosis. Aorta: The aortic root is normal in size and structure. Venous: The inferior vena cava is normal in size with greater than 50% respiratory variability, suggesting right atrial pressure of 3 mmHg. IAS/Shunts: No atrial level shunt detected by color flow Doppler.  LEFT VENTRICLE PLAX 2D LVIDd:         5.30 cm  Diastology LVIDs:         3.70 cm  LV e' medial:    3.81 cm/s LV PW:         1.40 cm  LV E/e' medial:  20.2 LV IVS:        1.60 cm  LV e' lateral:   5.87 cm/s LVOT diam:     1.90 cm  LV E/e' lateral: 13.1 LV SV:          58 LV SV Index:   38 LVOT Area:     2.84 cm  RIGHT VENTRICLE          IVC RV Basal diam:  4.90 cm  IVC diam: 1.40 cm LEFT ATRIUM             Index       RIGHT ATRIUM           Index LA diam:        3.50 cm 2.30 cm/m  RA Area:     13.90 cm LA Vol (A2C):   51.1 ml 33.52 ml/m RA Volume:   34.20 ml  22.44 ml/m LA Vol (A4C):   43.4 ml 28.47 ml/m LA Biplane Vol: 52.1 ml 34.18 ml/m  AORTIC VALVE AV Area (Vmax):    1.68 cm AV Area (Vmean):   1.63 cm AV Area (VTI):     1.67 cm AV Vmax:           189.00 cm/s AV Vmean:          125.000 cm/s AV VTI:            0.347 m AV Peak Grad:      14.3 mmHg AV Mean Grad:      7.5 mmHg LVOT Vmax:         112.00 cm/s LVOT Vmean:        71.800 cm/s LVOT VTI:          0.204 m LVOT/AV VTI ratio: 0.59  AORTA Ao Root diam:  3.10 cm Ao Asc diam:  2.80 cm MITRAL VALVE MV Area (PHT): 3.99 cm     SHUNTS MV Area VTI:   1.67 cm     Systemic VTI:  0.20 m MV Peak grad:  13.8 mmHg    Systemic Diam: 1.90 cm MV Mean grad:  5.0 mmHg MV Vmax:       1.86 m/s MV Vmean:      107.0 cm/s MV Decel Time: 190 msec MV E velocity: 77.10 cm/s MV A velocity: 176.00 cm/s MV E/A ratio:  0.44 Kirk Ruths MD Electronically signed by Kirk Ruths MD Signature Date/Time: 07/27/2020/1:10:21 PM    Final      Assessment and Plan:   Acute on chronic systolic and diastolic heart failure -Lost to follow-up with cardiology since 2015, last seen 03/01/2020 for orthostatic hypotension -Cardiac cath from 2015 with clean coronaries -Presented with SOB, hypoxia in the setting of not wearing oxygen, leg edema -BNP 3606 (492 in 2021) -CXR without remarkable pulmonary edema - Echo repeat 6/23 revealed EF 30 to 35%, LV global hypokinesis, moderate LVH, grade 1 DD, elevated LA pressure, RV normal, mild dilatation of LA, no significant valvular disease (EF has dropped from 50 to 55% 08/25/2018 echo), upon review Echo imaging with attending, EF was felt unchanged  - Etiology include stress-induced cardiomyopathy -  Clinically hypervolemic - UOP 1L over the past 24 hours, weight 10> 117.4ib ?? - Monitor daily weight, intake and output, low sodium diet, fluid restriction <1.5L daily - continue IV Lasix 40 mg twice daily until euvolemic - GDMT: Currently on midodrine 5 mg 3 times daily to support chronic hypotension/orthostasis, concurrent AKI, unable to add ARNI/MRA, possible add SGLT2I if renal function improve later   Chest pain, atypical Elevated troponin - Reports 2 to 3 weeks onset of occasional midsternal/epigastric pain, no specific trigger, lasting minutes and resolve spontaneously -2015 cath with clean coronaries - Hs trop 70s>50s, demand ischemia  - EKG no acute ischemic findings - risk factor including smoking, HLD untreated  - May consider repeat outpatient ischemic evaluation with Myoview if CP re-occurs   Acute on chronic hypoxic respiratory failure - Due to noncompliant with oxygen therapy, COPD, decompensated heart failure - Continue supplemental oxygen - VQ scan pending   COPD -on Albuterol and steroids per IM  - no wheezing or cough on exam   Acute kidney injury -Creatinine 2.9 POA, was in 1-1.3 ranges in 2021, improving to 2.31 with diuresis  -possible CRS, continue diuresis -Trend BMP daily, monitor urine output, avoid nephrotoxic agent - consider check UA given reports of hematuria   Chronic hypotension with orthostasis -on Midodrine, BP low normal   Hypothyroidism -Will update TSH tomorrow, on levothyroxine  HLD - no recent lipid panel, will check lipid panel tomorrow  - not tolerating lipitor due to myalgia, consider pravastatin  Tobacco abuse Chronic pain syndrome Anxiety with depression Polypharmacy Carotid artery disease -Managed per IM     Risk Assessment/Risk Scores:  HEAR Score (for undifferentiated chest pain):  HEAR Score: 3{   New York Heart Association (NYHA) Functional Class NYHA Class II    For questions or updates, please contact [facility] Please consult [email or web address] for contact info under    Signed, Margie Billet, NP  07/27/2020 4:36 PM  Patient seen and examined with Karl Bales NP.  Agree as above, with the following exceptions and changes as noted below.  I visited Mrs. Mccaffery and her husband was at the bedside.  She denies current  chest pain but does feel somewhat short of breath, improved with current oxygen use though O2 saturation remains mildly decreased and is 92% when I am in the room on 5L of oxygen.  She has been severely depressed for quite some time with acute worsening when her friend passed away 2 months ago.  She was not informed when her friend died and found out several days later after her cremation which she feels was a huge shock to both her and her husband and did not allow her to have closure.  She presented to the hospital with shortness of breath and hypoxia which may be multifactorial given the patient's history of smoking and the fact that she was not wearing her oxygen at home for diagnosis of COPD.  She feels well at the moment with O2 in place resting comfortably in bed Gen: NAD, CV: RRR, no murmurs, Lungs: Shallow breaths but overall clear, Abd: soft, Extrem: Warm, well perfused, trace bilateral pedal edema, Neuro/Psych: alert and oriented x 3, normal mood and affect. All available labs, radiology testing, previous records reviewed.   I have independently reviewed the echocardiogram from 2020 as well as her echocardiogram images today and have reviewed them with the interpreting physician as well.  I do not believe there has been a significant change in her ejection fraction since 2020.  Her EF on echo in 2020 appears to be approximately 40% and today EF appears to be 40%.  No significant change has occurred.  There is septal dyssynchrony due to left bundle branch block which is also chronic.  She is known to have normal coronary arteries from 2015, but with risk factors, she warrants an ischemic  evaluation though perhaps this can be completed as an outpatient, and the patient and her husband agree that this would be reasonable.  She would like to continue to follow in [place] with Dr. Domenic Polite.  It is reasonable to continue IV diuresis and monitor renal function given elevated BNP.  With a creatinine of 2.3, she may require a higher dose of Lasix if she has not diuresing well.  She subjectively feels improved and she feels her lower extremity edema is improving as well.  Unfortunately she has been hypotensive requiring midodrine which limits her ability to add heart failure therapy to her regimen.  Renal function is also prohibitive.  Cardiology will follow with you.  Elouise Munroe, MD

## 2020-07-28 DIAGNOSIS — Z515 Encounter for palliative care: Secondary | ICD-10-CM

## 2020-07-28 DIAGNOSIS — Z7189 Other specified counseling: Secondary | ICD-10-CM

## 2020-07-28 LAB — TSH: TSH: 0.336 u[IU]/mL — ABNORMAL LOW (ref 0.350–4.500)

## 2020-07-28 LAB — BASIC METABOLIC PANEL
Anion gap: 9 (ref 5–15)
BUN: 51 mg/dL — ABNORMAL HIGH (ref 8–23)
CO2: 40 mmol/L — ABNORMAL HIGH (ref 22–32)
Calcium: 8.8 mg/dL — ABNORMAL LOW (ref 8.9–10.3)
Chloride: 90 mmol/L — ABNORMAL LOW (ref 98–111)
Creatinine, Ser: 2.26 mg/dL — ABNORMAL HIGH (ref 0.44–1.00)
GFR, Estimated: 24 mL/min — ABNORMAL LOW (ref >60)
Glucose, Bld: 160 mg/dL — ABNORMAL HIGH (ref 70–99)
Potassium: 3.7 mmol/L (ref 3.5–5.1)
Sodium: 139 mmol/L (ref 135–145)

## 2020-07-28 LAB — T4, FREE: Free T4: 0.87 ng/dL (ref 0.61–1.12)

## 2020-07-28 LAB — LIPID PANEL
Cholesterol: 129 mg/dL (ref 0–200)
HDL: 28 mg/dL — ABNORMAL LOW (ref >40)
LDL Cholesterol: 79 mg/dL (ref 0–99)
Total CHOL/HDL Ratio: 4.6 RATIO
Triglycerides: 108 mg/dL (ref <150)
VLDL: 22 mg/dL (ref 0–40)

## 2020-07-28 LAB — MAGNESIUM: Magnesium: 1.4 mg/dL — ABNORMAL LOW (ref 1.7–2.4)

## 2020-07-28 NOTE — Evaluation (Signed)
Occupational Therapy Evaluation Patient Details Name: [name] MRN: [id number] DOB: [birth year] Today's Date: 07/28/2020    History of Present Illness 65 y.o. F admitted to [facility] due to hypoxia from noncompliant O2 wearing, resulting in hypercapnic respiratory failure. PMH significant for 3L O2-dependent COPD, HTN, HLD, chronic pain under pain management, hypothyroidism, PVD and uterine cancer.   Clinical Impression   Pt admitted for concerns listed above. PTA pt reported independence with periods where she "wouldn't do anything" due to depression. At the time of the evaluation, pt demonstrated with increased balance deficits and decreased activity tolerance. Pt presented with multiple LoB, OT provided min guard to assist with steadying. Pt educated on use of RW for safety, pt agreeable to education. Acute OT will continue to follow to address concerns listed below.     Follow Up Recommendations  Home health OT;Supervision - Intermittent    Equipment Recommendations  Tub/shower seat;Other (comment) (RW)    Recommendations for Other Services       Precautions / Restrictions Precautions Precautions: Fall Restrictions Weight Bearing Restrictions: No      Mobility Bed Mobility Overal bed mobility: Modified Independent             General bed mobility comments: Pt sitting EOB on entry, At end of session, pt complete stand<>sit and sit<>supine. All with mod I for increased time and HOB elevated.    Transfers Overall transfer level: Needs assistance Equipment used: None Transfers: Sit to/from Stand Sit to Stand: Min guard         General transfer comment: Pt balance is unsteady, several LoB noted in room returning to bed with no DME    Balance Overall balance assessment: Needs assistance Sitting-balance support: Feet supported Sitting balance-Leahy Scale: Good     Standing balance support: No upper extremity supported Standing balance-Leahy Scale:  Poor Standing balance comment: Pt required min guard to steady and safely return to bed. OT provided education on using a RW, pt reported that she would think about it.                           ADL either performed or assessed with clinical judgement   ADL Overall ADL's : Needs assistance/impaired Eating/Feeding: Independent;Sitting   Grooming: Wash/dry hands;Min guard;Standing   Upper Body Bathing: Min guard;Sitting   Lower Body Bathing: Minimal assistance;Sitting/lateral leans;Sit to/from stand   Upper Body Dressing : Independent;Sitting   Lower Body Dressing: Min guard;Sitting/lateral leans;Sit to/from stand   Toilet Transfer: Min guard;Ambulation   Toileting- Clothing Manipulation and Hygiene: Min guard;Sit to/from stand;Sitting/lateral lean   Tub/ Shower Transfer: Min guard;Ambulation   Functional mobility during ADLs: Min guard General ADL Comments: Pt's overall ADL performance is affected by her balance deficits, requiring her to have min guard for all OOB activities.     Vision Baseline Vision/History: Wears glasses Wears Glasses: Reading only Patient Visual Report: No change from baseline Vision Assessment?: No apparent visual deficits     Perception Perception Perception Tested?: No   Praxis Praxis Praxis tested?: Not tested    Pertinent Vitals/Pain Pain Assessment: No/denies pain     Hand Dominance Right   Extremity/Trunk Assessment Upper Extremity Assessment Upper Extremity Assessment: Overall WFL for tasks assessed   Lower Extremity Assessment Lower Extremity Assessment: Defer to PT evaluation   Cervical / Trunk Assessment Cervical / Trunk Assessment: Normal   Communication Communication Communication: No difficulties   Cognition Arousal/Alertness: Awake/alert Behavior During Therapy: Flat affect  Overall Cognitive Status: Within Functional Limits for tasks assessed                                 General Comments:  Talked about her depression and how there are some days that she doesn't even want to take a shower or sit up.   General Comments  On entry, seated, HR 80, O2 96% on 4/5L O2, ambulating to bathroom, HR 86, O2 82-86% on 4L, Return to seated EOB, HR 86, O2 88%, 3 mins later supine in bed, HR 81, O2 96%.    Exercises     Shoulder Instructions      Home Living Family/patient expects to be discharged to:: Private residence Living Arrangements: Spouse/significant other Available Help at Discharge: Family;Available 24 hours/day Type of Home: House Home Access: Stairs to enter [employer] of Steps: 3 steps Entrance Stairs-Rails: Can reach both Home Layout: One level     Bathroom Shower/Tub: Occupational psychologist: Standard Bathroom Accessibility: Yes How Accessible: Accessible via walker Home Equipment: Cane - single point          Prior Functioning/Environment Level of Independence: Independent        Comments: Pt reports independence, however also includes difficulty recently with getting OOB to complete any ADL's, due to depression.        OT Problem List: Decreased strength;Decreased activity tolerance;Impaired balance (sitting and/or standing);Decreased coordination;Decreased safety awareness;Decreased knowledge of use of DME or AE;Cardiopulmonary status limiting activity      OT Treatment/Interventions: Self-care/ADL training;Therapeutic exercise;Energy conservation;DME and/or AE instruction;Therapeutic activities;Patient/family education;Balance training    OT Goals(Current goals can be found in the care plan section) Acute Rehab OT Goals Patient Stated Goal: To try to start taking care of herself better again. OT Goal Formulation: With patient Time For Goal Achievement: 08/11/20 Potential to Achieve Goals: Good ADL Goals Pt Will Transfer to Toilet: with modified independence;ambulating Pt/caregiver will Perform Home Exercise Program: Increased  strength;Both right and left upper extremity;Independently;With written HEP provided Additional ADL Goal #1: Pt will tolerate 5 mins of OOB activity with no rest break. Additional ADL Goal #2: Pt will verbalize 3 energy conservation techniques that she can use at home.  OT Frequency: Min 2X/week   Barriers to D/C:            Co-evaluation              AM-PAC OT "6 Clicks" Daily Activity     Outcome Measure Help from another person eating meals?: None Help from another person taking care of personal grooming?: A Little Help from another person toileting, which includes using toliet, bedpan, or urinal?: A Little Help from another person bathing (including washing, rinsing, drying)?: A Little Help from another person to put on and taking off regular upper body clothing?: None Help from another person to put on and taking off regular lower body clothing?: A Little 6 Click Score: 20   End of Session Equipment Utilized During Treatment: Oxygen;Gait belt Nurse Communication: Mobility status  Activity Tolerance: Patient tolerated treatment well Patient left: in bed;with call bell/phone within reach;with bed alarm set  OT Visit Diagnosis: Unsteadiness on feet (R26.81);Other abnormalities of gait and mobility (R26.89);Muscle weakness (generalized) (M62.81)                Time: 3267-1245 OT Time Calculation (min): 29 min Charges:  OT General Charges $OT Visit: 1 Visit OT Evaluation $OT  Eval Moderate Complexity: 1 Mod OT Treatments $Self Care/Home Management : 8-22 mins  Kazia Grisanti H., OTR/L Acute Rehabilitation  Jonnae Fonseca Elane Yolanda Bonine 07/28/2020, 11:25 AM

## 2020-07-28 NOTE — Plan of Care (Signed)

## 2020-07-28 NOTE — Evaluation (Signed)
Physical Therapy Evaluation Patient Details Name: [name] MRN: [id number] DOB: 1955-09-03 Today's Date: 07/28/2020   History of Present Illness  65 y.o. F admitted to [facility] due to hypoxia from noncompliant O2 wearing, resulting in hypercapnic respiratory failure. PMH significant for 3L O2-dependent COPD, HTN, HLD, chronic pain under pain management, hypothyroidism, PVD and uterine cancer.   Clinical Impression  Pt admitted with above diagnosis. PTA pt lived at home with her husband, independent. On eval, she required min guard assist transfers, and min assist ambulation 150' pushing portable O2. Mobilized on 4L with SpO2 > 90%. Pt presents with deficits in balance and activity tolerance. Pt declined use of RW but does have one at home if needed. Pt currently with functional limitations due to the deficits listed below (see PT Problem List). Pt will benefit from skilled PT to increase their independence and safety with mobility to allow discharge to the venue listed below.       Follow Up Recommendations Home health PT    Equipment Recommendations  None recommended by PT    Recommendations for Other Services       Precautions / Restrictions Precautions Precautions: Fall Restrictions Weight Bearing Restrictions: No      Mobility  Bed Mobility Overal bed mobility: Modified Independent             General bed mobility comments: Pt sitting EOB on entry, At end of session, pt complete stand<>sit and sit<>supine. All with mod I for increased time and HOB elevated.    Transfers Overall transfer level: Needs assistance Equipment used: None Transfers: Sit to/from [facility] Sit to Stand: Min guard Stand pivot transfers: Min guard       General transfer comment: unsteady, reaching for UE support  Ambulation/Gait Ambulation/Gait assistance: Min assist Gait Distance (Feet): 150 Feet Assistive device: None Gait Pattern/deviations: Step-through  pattern;Decreased stride length Gait velocity: decreased Gait velocity interpretation: 1.31 - 2.62 ft/sec, indicative of limited community ambulator General Gait Details: Pt managed portable O2 per her preference as therapist provided min assist via gait belt. Mobilized on 4L with SpO2 > 90%. No SOB noted. Fatigued upon return to room.  Stairs            Wheelchair Mobility    Modified Rankin (Stroke Patients Only)       Balance Overall balance assessment: Needs assistance Sitting-balance support: Feet supported;No upper extremity supported Sitting balance-Leahy Scale: Good     Standing balance support: No upper extremity supported;During functional activity Standing balance-Leahy Scale: Poor Standing balance comment: reliant on external support                             Pertinent Vitals/Pain Pain Assessment: No/denies pain    Home Living Family/patient expects to be discharged to:: Private residence Living Arrangements: Spouse/significant other Available Help at Discharge: Family;Available 24 hours/day Type of Home: House Home Access: Stairs to enter Entrance Stairs-Rails: Left;Right;Can reach both Entrance Stairs-Number of Steps: 3 Home Layout: One level Home Equipment: Cane - single point;Walker - 2 wheels Additional Comments: DME belongs to her husband but he is not currently using any of it.    Prior Function Level of Independence: Independent         Comments: Pt reports independence, however also includes difficulty recently with getting OOB to complete any ADL's, due to depression.     Hand Dominance   Dominant Hand: Right    Extremity/Trunk Assessment  Upper Extremity Assessment Upper Extremity Assessment: Overall WFL for tasks assessed    Lower Extremity Assessment Lower Extremity Assessment: Overall WFL for tasks assessed    Cervical / Trunk Assessment Cervical / Trunk Assessment: Normal  Communication   Communication:  No difficulties  Cognition Arousal/Alertness: Awake/alert Behavior During Therapy: Flat affect Overall Cognitive Status: Within Functional Limits for tasks assessed                                 General Comments: Talked about her depression and how there are some days that she doesn't even want to take a shower or sit up.      General Comments General comments (skin integrity, edema, etc.): On entry, seated, HR 80, O2 96% on 4/5L O2, ambulating to bathroom, HR 86, O2 82-86% on 4L, Return to seated EOB, HR 86, O2 88%, 3 mins later supine in bed, HR 81, O2 96%.    Exercises     Assessment/Plan    PT Assessment Patient needs continued PT services  PT Problem List Decreased mobility;Decreased balance;Decreased activity tolerance;Cardiopulmonary status limiting activity       PT Treatment Interventions DME instruction;Therapeutic activities;Functional mobility training;Balance training;Therapeutic exercise;Patient/family education;Gait training;Stair training    PT Goals (Current goals can be found in the Care Plan section)  Acute Rehab PT Goals Patient Stated Goal: feel better PT Goal Formulation: With patient Time For Goal Achievement: 08/11/20 Potential to Achieve Goals: Good    Frequency Min 3X/week   Barriers to discharge        Co-evaluation               AM-PAC PT "6 Clicks" Mobility  Outcome Measure Help needed turning from your back to your side while in a flat bed without using bedrails?: None Help needed moving from lying on your back to sitting on the side of a flat bed without using bedrails?: None Help needed moving to and from a bed to a chair (including a wheelchair)?: A Little Help needed standing up from a chair using your arms (e.g., wheelchair or bedside chair)?: A Little Help needed to walk in hospital room?: A Little Help needed climbing 3-5 steps with a railing? : A Little 6 Click Score: 20    End of Session Equipment Utilized  During Treatment: Gait belt;Oxygen Activity Tolerance: Patient tolerated treatment well Patient left: in chair;with call bell/phone within reach;with chair alarm set Nurse Communication: Mobility status PT Visit Diagnosis: Unsteadiness on feet (R26.81)    Time: 3559-7416 PT Time Calculation (min) (ACUTE ONLY): 31 min   Charges:   PT Evaluation $PT Eval Low Complexity: 1 Low PT Treatments $Gait Training: 8-22 mins        Lorrin Goodell, PT  Office # [phone] Pager [phone]   [name] 07/28/2020, 1:06 PM

## 2020-07-28 NOTE — Progress Notes (Addendum)
PROGRESS NOTE  [name]  UXL:[id number] DOB: [birth year] DOA: 07/26/2020 PCP: [name], [name], MD   Brief Narrative: [name] is a 65 y.o. female with a history of 3L O2-dependent COPD, HTN, HLD, chronic pain under pain management, hypothyroidism, PVD and uterine cancer who was sent to the ED 6/22 from pain management/PM&R due to hypoxia while not wearing her prescribed oxygen. She's been more depressed over the past few months due to multiple friends/family deaths, eating very little, just drinking [facility], noticing 2-3 weeks of worsening leg swelling, and continuing to smoke 1 ppd. In the ED hypoxic and hypercapnic respiratory failure was confirmed by ABG, started on BiPAP. BNP elevated to 3,606, d-dimer 2.0, creatinine 2.9, CXR with bibasilar scarring, cardiomegaly, emphysema and no infiltrate. Steroids, breathing treatments, and lasix were given for COPD exacerbation and acute CHF.  Assessment & Plan: Principal Problem:   Acute on chronic respiratory failure with hypoxia and hypercapnia (HCC) Active Problems:   Hypothyroid   Cigarette smoker   Anxiety and depression   Chronic pain   Acute kidney injury ([place])   COPD  GOLD II / still smoking    Idiopathic hypotension   Acute on chronic combined systolic and diastolic CHF (congestive heart failure) (HCC)  Acute on chronic hypoxic and hypercarbic respiratory failure:  - Continue treating CHF and COPD as below.  - Wean oxygen as tolerated with target of 3L O2 (baseline). Target SpO2 88-95% with normal respiratory effort.   Acute on chronic combined HFrEF: Hx LVEF 50-55%, and impaired relaxation in 2020, now LVEF down to 30-35%, global hypokinesis w/mod LVH, G1DD, oddly a normal RV.  - Continue IV lasix 40mg  BID today (or per cardiology). BP declining a bit and appearing closer to euvolemia. Will certainly be able to deescalate diuresis in the next 24 hours.  - Strict I/O, daily weights. Cr stable.  - Heart  healthy diet. RD consulted - GDMT may be limited by hypotension and kidney impairment.   COPD with exacerbation: Severe emphysema.  - Complete 5 day steroid burst (6/22 - 6/26).   - Continue inhaled bronchodilators  AKI: Presumed, though baseline of 1-1.3 is based on data from 2020-2021. Improving, suggestive of renal congestion as etiology.  - Continue monitoring with diuresis. Given the progression of her multiorgan disease, her renal function baseline may now be somewhere in the realm of stage IIIb CKD. Will continue monitoring to define further. - Avoid nephrotoxins.   Tobacco use:  - Cessation counseling, nicotine patch ordered.  Chronic pain:  - No changes to home medications  Hypotension: Continue midodrine  Hypothyroidism: TSH mildly suppressed at 0.336.  - Continue synthroid - check T4 and T3. No changes to regimen currently planned in setting of acute illness.  Depression, anxiety with situational exacerbation/adjustment/grief reactions:  - Continue home medications, despite patient being at high risk for polypharmacy, will not change chronic regimen. - Will attempt to arrange outpatient psychiatry follow up as patient would benefit from ongoing psychotherapy, possible medical management.  - Palliative care was consulted. We will continue supporting holistically. Hoping to have family discussion around code status but husband not in room this morning.   DVT prophylaxis: Lovenox Code Status: Full Family Communication: None at bedside Disposition Plan:  Status is: Inpatient  Remains inpatient appropriate because:Inpatient level of care appropriate due to severity of illness  Dispo: The patient is from: Home              Anticipated d/c is to:  Home              Patient currently is not medically stable to d/c.   Difficult to place patient No  Consultants:  None  Procedures:  None  Antimicrobials: None   Subjective: Anxiety better with increased xanax. Leg  swelling improved, dyspnea improved at rest, but feels worn out from all the medical care and work up yesterday. No chest pain.   Objective: Vitals:   07/28/20 0257 07/28/20 0500 07/28/20 0600 07/28/20 0751  BP: 102/60  (!) 104/54 (!) 106/51  Pulse: 86  82 78  Resp: 15  16 15   Temp: 97.8 F (36.6 C)  97.8 F (36.6 C) 97.8 F (36.6 C)  TempSrc: Axillary   Oral  SpO2: 95%  100% 99%  Weight:  53.2 kg    Height:        Intake/Output Summary (Last 24 hours) at 07/28/2020 1006 Last data filed at 07/28/2020 0900 Gross per 24 hour  Intake --  Output 1150 ml  Net -1150 ml   Filed Weights   07/26/20 1226 07/27/20 0754 07/28/20 0500  Weight: 47.6 kg 53.3 kg 53.2 kg   Gen: 65 y.o. female in no distress Pulm: Nonlabored breathing supplemental oxygen. No wheezing, minimal crackles and globally very diminished (stable).  CV: Regular rate and rhythm. No murmur, rub, or gallop. No JVD, no pitting dependent edema. GI: Abdomen soft, non-tender, non-distended, with normoactive bowel sounds.  Ext: Warm, no deformities Skin: No new rashes, lesions or ulcers on visualized skin. Neuro: Alert and oriented. No focal neurological deficits. Psych: Judgement and insight appear fair. Mood euthymic & affect congruent. Behavior is appropriate.    Data Reviewed: I have personally reviewed following labs and imaging studies  CBC: Recent Labs  Lab 07/26/20 1329 07/26/20 1335 07/27/20 0210  WBC 10.7*  --  7.9  NEUTROABS 8.1*  --   --   HGB 12.6 15.6* 13.2  HCT 45.0 46.0 47.5*  MCV 85.1  --  84.7  PLT 355  --  510   Basic Metabolic Panel: Recent Labs  Lab 07/26/20 1329 07/26/20 1335 07/27/20 0210 07/28/20 0125  NA 137 135 137 139  K 4.0 4.0 4.2 3.7  CL 93*  --  94* 90*  CO2 30  --  31 40*  GLUCOSE 107*  --  129* 160*  BUN 44*  --  39* 51*  CREATININE 2.90*  --  2.31* 2.26*  CALCIUM 9.0  --  9.4 8.8*  MG  --   --   --  1.4*   GFR: Estimated Creatinine Clearance: 20.2 mL/min (A) (by C-G  formula based on SCr of 2.26 mg/dL (H)). Liver Function Tests: Recent Labs  Lab 07/26/20 1329  AST 39  ALT 30  ALKPHOS 135*  BILITOT 2.1*  PROT 6.5  ALBUMIN 3.3*   Lipid Profile: Recent Labs    07/28/20 0125  CHOL 129  HDL 28*  LDLCALC 79  TRIG 108  CHOLHDL 4.6   Thyroid Function Tests: Recent Labs    07/28/20 0125  TSH 0.336*   Anemia Panel: No results for input(s): VITAMINB12, FOLATE, FERRITIN, TIBC, IRON, RETICCTPCT in the last 72 hours. Urine analysis:    Component Value Date/Time   COLORURINE STRAW (A) 07/26/2020 1730   APPEARANCEUR CLEAR 07/26/2020 1730   LABSPEC 1.008 07/26/2020 1730   PHURINE 5.0 07/26/2020 1730   GLUCOSEU NEGATIVE 07/26/2020 1730   HGBUR NEGATIVE 07/26/2020 1730   BILIRUBINUR NEGATIVE 07/26/2020 1730   KETONESUR  NEGATIVE 07/26/2020 1730   PROTEINUR NEGATIVE 07/26/2020 1730   UROBILINOGEN 0.2 02/21/2012 0005   NITRITE NEGATIVE 07/26/2020 1730   LEUKOCYTESUR NEGATIVE 07/26/2020 1730   Recent Results (from the past 240 hour(s))  Resp Panel by RT-PCR (Flu A&B, Covid) Nasopharyngeal Swab     Status: None   Collection Time: 07/26/20  1:29 PM   Specimen: Nasopharyngeal Swab; Nasopharyngeal(NP) swabs in vial transport medium  Result Value Ref Range Status   SARS Coronavirus 2 by RT PCR NEGATIVE NEGATIVE Final    Comment: (NOTE) SARS-CoV-2 target nucleic acids are NOT DETECTED.  The SARS-CoV-2 RNA is generally detectable in upper respiratory specimens during the acute phase of infection. The lowest concentration of SARS-CoV-2 viral copies this assay can detect is 138 copies/mL. A negative result does not preclude SARS-Cov-2 infection and should not be used as the sole basis for treatment or other patient management decisions. A negative result may occur with  improper specimen collection/handling, submission of specimen other than nasopharyngeal swab, presence of viral mutation(s) within the areas targeted by this assay, and inadequate  number of viral copies(<138 copies/mL). A negative result must be combined with clinical observations, patient history, and epidemiological information. The expected result is Negative.  Fact Sheet for Patients:  EntrepreneurPulse.com.au  Fact Sheet for Healthcare Providers:  IncredibleEmployment.be  This test is no t yet approved or cleared by the [place] FDA and  has been authorized for detection and/or diagnosis of SARS-CoV-2 by FDA under an Emergency Use Authorization (EUA). This EUA will remain  in effect (meaning this test can be used) for the duration of the COVID-19 declaration under Section 564(b)(1) of the Act, 21 U.S.C.section 360bbb-3(b)(1), unless the authorization is terminated  or revoked sooner.       Influenza A by PCR NEGATIVE NEGATIVE Final   Influenza B by PCR NEGATIVE NEGATIVE Final    Comment: (NOTE) The Xpert Xpress SARS-CoV-2/FLU/RSV plus assay is intended as an aid in the diagnosis of influenza from Nasopharyngeal swab specimens and should not be used as a sole basis for treatment. Nasal washings and aspirates are unacceptable for Xpert Xpress SARS-CoV-2/FLU/RSV testing.  Fact Sheet for Patients: EntrepreneurPulse.com.au  Fact Sheet for Healthcare Providers: IncredibleEmployment.be  This test is not yet approved or cleared by the [place] FDA and has been authorized for detection and/or diagnosis of SARS-CoV-2 by FDA under an Emergency Use Authorization (EUA). This EUA will remain in effect (meaning this test can be used) for the duration of the COVID-19 declaration under Section 564(b)(1) of the Act, 21 U.S.C. section 360bbb-3(b)(1), unless the authorization is terminated or revoked.  Performed at [facility], [place] [address], [place], [place] [address]       Radiology Studies: NM Pulmonary Perf and Vent  Result Date: 07/27/2020 CLINICAL DATA:  Positive  D-dimer and shortness of breath, initial encounter EXAM: NUCLEAR MEDICINE PERFUSION LUNG SCAN TECHNIQUE: Perfusion images were obtained in multiple projections after intravenous injection of radiopharmaceutical. Ventilation scans intentionally deferred if perfusion scan and chest x-ray adequate for interpretation during COVID 19 epidemic. RADIOPHARMACEUTICALS:  4.15 mCi Tc-28m MAA IV COMPARISON:  07/26/2020 FINDINGS: Decreased perfusion is noted secondary to emphysematous changes in the right upper lobe. Adequate uptake is noted throughout both lungs without wedge-shaped defect to suggest pulmonary embolism. IMPRESSION: No evidence of pulmonary embolism. Changes consistent with COPD with decreased perfusion in the right upper lobe. Electronically Signed   By: Inez Catalina M.D.   On: 07/27/2020 17:29   DG CHEST  PORT 1 VIEW  Result Date: 07/27/2020 CLINICAL DATA:  65 year old female with acute respiratory failure. EXAM: PORTABLE CHEST 1 VIEW COMPARISON:  Chest radiograph dated 07/27/2020. FINDINGS: Background of chronic interstitial coarsening. No focal consolidation, pleural effusion pneumothorax. Stable cardiomegaly. Atherosclerotic calcification of the aorta. No osseous pathology. Old left rib fractures. IMPRESSION: No active disease. Electronically Signed   By: Anner Crete M.D.   On: 07/27/2020 19:46   DG Chest Port 1 View  Result Date: 07/26/2020 CLINICAL DATA:  Dyspnea EXAM: PORTABLE CHEST 1 VIEW COMPARISON:  06/11/2019 FINDINGS: Cardiomegaly. Scarring and or atelectasis at the bilateral lung bases. Emphysema. The visualized skeletal structures are unremarkable. IMPRESSION: 1.  Cardiomegaly. 2. Scarring and or atelectasis at the bilateral lung bases without acute appearing airspace opacity. 3.  Emphysema. Electronically Signed   By: Eddie Candle M.D.   On: 07/26/2020 13:48   ECHOCARDIOGRAM COMPLETE  Result Date: 07/27/2020    ECHOCARDIOGRAM REPORT   Patient Name:   VITORIA CONYER Panico Date of  Exam: 07/27/2020 Medical Rec #:  [id number]          Height:       62.0 in Accession #:    [id number]         Weight:       117.4 lb Date of Birth:  03-Sep-1955           BSA:          1.524 m Patient Age:    5 years           BP:           105/53 mmHg Patient Gender: F                  HR:           80 bpm. Exam Location:  Inpatient Procedure: 2D Echo, Cardiac Doppler and Color Doppler Indications:    CHF  History:        Patient has prior history of Echocardiogram examinations, most                 recent 08/25/2018. COPD; Risk Factors:Hypertension, Dyslipidemia                 and Current Smoker.  Sonographer:    Cammy Brochure Referring Phys: [place]  1. Left ventricular ejection fraction, by estimation, is 30 to 35%. The left ventricle has moderately decreased function. The left ventricle demonstrates global hypokinesis. The left ventricular internal cavity size was mildly dilated. There is moderate  left ventricular hypertrophy. Left ventricular diastolic parameters are consistent with Grade I diastolic dysfunction (impaired relaxation). Elevated left atrial pressure.  2. Right ventricular systolic function is normal. The right ventricular size is normal.  3. Left atrial size was mildly dilated.  4. The mitral valve is normal in structure. No evidence of mitral valve regurgitation. No evidence of mitral stenosis.  5. The aortic valve has an indeterminant number of cusps. Aortic valve regurgitation is not visualized. No aortic stenosis is present.  6. The inferior vena cava is normal in size with greater than 50% respiratory variability, suggesting right atrial pressure of 3 mmHg. FINDINGS  Left Ventricle: Left ventricular ejection fraction, by estimation, is 30 to 35%. The left ventricle has moderately decreased function. The left ventricle demonstrates global hypokinesis. The left ventricular internal cavity size was mildly dilated. There is moderate left ventricular hypertrophy.  Abnormal (paradoxical) septal motion, consistent with left bundle branch block. Left ventricular  diastolic parameters are consistent with Grade I diastolic dysfunction (impaired relaxation). Elevated left atrial pressure. Right Ventricle: The right ventricular size is normal. Right ventricular systolic function is normal. Left Atrium: Left atrial size was mildly dilated. Right Atrium: Right atrial size was normal in size. Pericardium: There is no evidence of pericardial effusion. Mitral Valve: The mitral valve is normal in structure. No evidence of mitral valve regurgitation. No evidence of mitral valve stenosis. MV peak gradient, 13.8 mmHg. The mean mitral valve gradient is 5.0 mmHg. Tricuspid Valve: The tricuspid valve is normal in structure. Tricuspid valve regurgitation is trivial. No evidence of tricuspid stenosis. Aortic Valve: The aortic valve has an indeterminant number of cusps. Aortic valve regurgitation is not visualized. No aortic stenosis is present. Aortic valve mean gradient measures 7.5 mmHg. Aortic valve peak gradient measures 14.3 mmHg. Pulmonic Valve: The pulmonic valve was normal in structure. Pulmonic valve regurgitation is not visualized. No evidence of pulmonic stenosis. Aorta: The aortic root is normal in size and structure. Venous: The inferior vena cava is normal in size with greater than 50% respiratory variability, suggesting right atrial pressure of 3 mmHg. IAS/Shunts: No atrial level shunt detected by color flow Doppler.  LEFT VENTRICLE PLAX 2D LVIDd:         5.30 cm  Diastology LVIDs:         3.70 cm  LV e' medial:    3.81 cm/s LV PW:         1.40 cm  LV E/e' medial:  20.2 LV IVS:        1.60 cm  LV e' lateral:   5.87 cm/s LVOT diam:     1.90 cm  LV E/e' lateral: 13.1 LV SV:         58 LV SV Index:   38 LVOT Area:     2.84 cm  RIGHT VENTRICLE          IVC RV Basal diam:  4.90 cm  IVC diam: 1.40 cm LEFT ATRIUM             Index       RIGHT ATRIUM           Index LA diam:        3.50 cm  2.30 cm/m  RA Area:     13.90 cm LA Vol (A2C):   51.1 ml 33.52 ml/m RA Volume:   34.20 ml  22.44 ml/m LA Vol (A4C):   43.4 ml 28.47 ml/m LA Biplane Vol: 52.1 ml 34.18 ml/m  AORTIC VALVE AV Area (Vmax):    1.68 cm AV Area (Vmean):   1.63 cm AV Area (VTI):     1.67 cm AV Vmax:           189.00 cm/s AV Vmean:          125.000 cm/s AV VTI:            0.347 m AV Peak Grad:      14.3 mmHg AV Mean Grad:      7.5 mmHg LVOT Vmax:         112.00 cm/s LVOT Vmean:        71.800 cm/s LVOT VTI:          0.204 m LVOT/AV VTI ratio: 0.59  AORTA Ao Root diam: 3.10 cm Ao Asc diam:  2.80 cm MITRAL VALVE MV Area (PHT): 3.99 cm     SHUNTS MV Area VTI:   1.67 cm     Systemic VTI:  0.20 m MV Peak grad:  13.8 mmHg    Systemic Diam: 1.90 cm MV Mean grad:  5.0 mmHg MV Vmax:       1.86 m/s MV Vmean:      107.0 cm/s MV Decel Time: 190 msec MV E velocity: 77.10 cm/s MV A velocity: 176.00 cm/s MV E/A ratio:  0.44 Kirk Ruths MD Electronically signed by Kirk Ruths MD Signature Date/Time: 07/27/2020/1:10:21 PM    Final     Scheduled Meds:  docusate sodium  100 mg Oral BID   DULoxetine  60 mg Oral BID   enoxaparin (LOVENOX) injection  30 mg Subcutaneous Q24H   furosemide  40 mg Intravenous BID   levothyroxine  50 mcg Oral QAC breakfast   midodrine  5 mg Oral TID WC   mirtazapine  15 mg Oral QHS   multivitamin with minerals  1 tablet Oral Daily   nicotine  21 mg Transdermal Daily   pantoprazole  40 mg Oral BID AC   predniSONE  40 mg Oral Q breakfast   pregabalin  50 mg Oral TID   QUEtiapine  50-100 mg Oral QHS   sodium chloride flush  3 mL Intravenous Q12H   Continuous Infusions:   LOS: 2 days   Time spent: 35 minutes.  Patrecia Pour, MD [facility] [email or web address] 07/28/2020, 10:06 AM

## 2020-07-28 NOTE — TOC Initial Note (Addendum)
Transition of Care [facility]) - Initial/Assessment Note    Patient Details  Name: [name] MRN: [id number] Date of Birth: [birth year]  [facility] CM/SW Contact:    Verdell Carmine, RN Phone Number: 07/28/2020, 8:07 AM  Clinical Narrative:                   65 year old admitted for excacerbation of COPD< hypoxia. On oxygen at home, however is noncompliant with it. Normal activities include working around the house. She had a friend die recently and has been less active in the home, sleeping more than usual. She has a history of COPD, CHF< chronic pain. Palliative care saw her and her husband for goals of care. She continues to smoke, This Cm spoke to her briefly, but the doctor came in and she had to go.  PT consul already orderedt for their recommendations on home health ..  recommend psychiatric consult, or follow up to address depression. CM will follow for needs    Barriers to Discharge: Continued Medical Work up   Patient Goals and CMS Choice        Expected Discharge Plan and Services     Discharge Planning Services: CM Consult   Living arrangements for the past 2 months: [place]                                      Prior Living Arrangements/Services Living arrangements for the past 2 months: [place] Lives with:: Spouse Patient language and need for interpreter reviewed:: Yes        Need for Family Participation in Patient Care: Yes (Comment) Care giver support system in place?: Yes (comment)   Criminal Activity/Legal Involvement Pertinent to Current Situation/Hospitalization: No - Comment as needed  Activities of Daily Living      Permission Sought/Granted      Share Information with NAME: Husbans Steven           Emotional Assessment   Attitude/Demeanor/Rapport: Apprehensive Affect (typically observed): Depressed Orientation: : Oriented to Self, Oriented to Place, Oriented to  Time, Oriented to  Situation Alcohol / Substance Use: Tobacco Use Psych Involvement: No (comment)  Admission diagnosis:  Hypercarbia [R06.89] Positive D dimer [R79.89] COPD exacerbation ([place]) [J44.1] AKI (acute kidney injury) ([place]) [N17.9] Acute on chronic respiratory failure with hypoxia and hypercapnia (HCC) [J96.21, J96.22] Congestive heart failure, unspecified HF chronicity, unspecified heart failure type ([place]) [I50.9] Patient Active Problem List   Diagnosis Date Noted   Acute on chronic respiratory failure with hypoxia and hypercapnia ([place]) 07/26/2020   Acute on chronic combined systolic and diastolic CHF (congestive heart failure) ([place]) 07/26/2020   Idiopathic hypotension 10/08/2019   Encounter for long-term use of opiate analgesic 03/12/2019   IDA (iron deficiency anemia) 02/17/2019   Gastritis and gastroduodenitis 02/17/2019   COPD  GOLD II / still smoking  12/08/2018   Chronic respiratory failure with hypoxia and hypercapnia ([place]) 12/08/2018   Radiculopathy due to lumbar intervertebral disc disorder 10/29/2018   Sciatica of left side 10/29/2018   Anemia    Gastrointestinal hemorrhage    Elevated LFTs    Hypokalemia 08/24/2018   COPD exacerbation ([place]) 03/18/2014   Acute kidney injury ([place]) 03/18/2014   Acute respiratory failure with hypoxia ([place]) 03/18/2014   CAP (community acquired pneumonia) 03/17/2014   Pneumonia 03/17/2014   HTN (hypertension) 12/03/2012   Sepsis ([place]) 02/21/2012  PNA (pneumonia) 02/21/2012   Adrenal insufficiency ([place]) 02/21/2012   Subclavian steal syndrome 02/21/2012   Chronic pain 11/22/2010   Migraine headache 11/22/2010   Gastroesophageal reflux disease 11/22/2010   Encounter for therapeutic drug monitoring 11/22/2010   Cerebrovascular disease 07/24/2010   Anxiety and depression    Hyperlipidemia    Hypothyroid    Cigarette smoker    PCP:  [name], [name], MD Pharmacy:   [facility], [place] [address] Phone: [phone] Fax: [phone]     Social Determinants of Health (SDOH) Interventions    Readmission Risk Interventions Readmission Risk Prevention Plan 08/26/2018  Transportation Screening Complete  Home Care Screening Complete  Medication Review (RN CM) Complete  Some recent data might be hidden

## 2020-07-28 NOTE — Progress Notes (Addendum)
Progress Note  Patient Name: Chelesea Weiand Date of Encounter: 07/28/2020  Primary Cardiologist: Rozann Lesches, MD     Subjective   Slept well, no specific complaints today. BP remains soft.   Inpatient Medications    Scheduled Meds:  docusate sodium  100 mg Oral BID   DULoxetine  60 mg Oral BID   enoxaparin (LOVENOX) injection  30 mg Subcutaneous Q24H   furosemide  40 mg Intravenous BID   levothyroxine  50 mcg Oral QAC breakfast   midodrine  5 mg Oral TID WC   mirtazapine  15 mg Oral QHS   multivitamin with minerals  1 tablet Oral Daily   nicotine  21 mg Transdermal Daily   pantoprazole  40 mg Oral BID AC   predniSONE  40 mg Oral Q breakfast   pregabalin  50 mg Oral TID   QUEtiapine  50-100 mg Oral QHS   sodium chloride flush  3 mL Intravenous Q12H   Continuous Infusions:  PRN Meds: acetaminophen **OR** acetaminophen, albuterol, ALPRAZolam, bisacodyl, ondansetron **OR** ondansetron (ZOFRAN) IV, oxyCODONE, polyethylene glycol   Vital Signs    Vitals:   07/28/20 0257 07/28/20 0500 07/28/20 0600 07/28/20 0751  BP: 102/60  (!) 104/54 (!) 106/51  Pulse: 86  82 78  Resp: 15  16 15   Temp: 97.8 F (36.6 C)  97.8 F (36.6 C) 97.8 F (36.6 C)  TempSrc: Axillary   Oral  SpO2: 95%  100% 99%  Weight:  53.2 kg    Height:        Intake/Output Summary (Last 24 hours) at 07/28/2020 0756 Last data filed at 07/27/2020 1744 Gross per 24 hour  Intake --  Output 600 ml  Net -600 ml   Filed Weights   07/26/20 1226 07/27/20 0754 07/28/20 0500  Weight: 47.6 kg 53.3 kg 53.2 kg    Physical Exam   General: Ill appearing, NAD Neck: Negative for carotid bruits. No JVD Lungs:Clear to ausculation bilaterally. Breathing is unlabored. Cardiovascular: RRR with S1 S2. No murmurs Abdomen: Soft, non-tender, non-distended. No obvious abdominal masses. Extremities: No edema. Neuro: Alert and oriented. No focal deficits. No facial asymmetry. MAE spontaneously. Psych:  Responds to questions appropriately with normal affect.    Labs    Chemistry Recent Labs  Lab 07/26/20 1329 07/26/20 1335 07/27/20 0210 07/28/20 0125  NA 137 135 137 139  K 4.0 4.0 4.2 3.7  CL 93*  --  94* 90*  CO2 30  --  31 40*  GLUCOSE 107*  --  129* 160*  BUN 44*  --  39* 51*  CREATININE 2.90*  --  2.31* 2.26*  CALCIUM 9.0  --  9.4 8.8*  PROT 6.5  --   --   --   ALBUMIN 3.3*  --   --   --   AST 39  --   --   --   ALT 30  --   --   --   ALKPHOS 135*  --   --   --   BILITOT 2.1*  --   --   --   GFRNONAA 18*  --  23* 24*  ANIONGAP 14  --  12 9     Hematology Recent Labs  Lab 07/26/20 1329 07/26/20 1335 07/27/20 0210  WBC 10.7*  --  7.9  RBC 5.29*  --  5.61*  HGB 12.6 15.6* 13.2  HCT 45.0 46.0 47.5*  MCV 85.1  --  84.7  MCH 23.8*  --  23.5*  MCHC 28.0*  --  27.8*  RDW 20.9*  --  20.8*  PLT 355  --  376    Cardiac EnzymesNo results for input(s): TROPONINI in the last 168 hours. No results for input(s): TROPIPOC in the last 168 hours.   BNP Recent Labs  Lab 07/26/20 1329  BNP 3,606.8*     DDimer  Recent Labs  Lab 07/26/20 1329  DDIMER 2.00*     Radiology    NM Pulmonary Perf and Vent  Result Date: 07/27/2020 CLINICAL DATA:  Positive D-dimer and shortness of breath, initial encounter EXAM: NUCLEAR MEDICINE PERFUSION LUNG SCAN TECHNIQUE: Perfusion images were obtained in multiple projections after intravenous injection of radiopharmaceutical. Ventilation scans intentionally deferred if perfusion scan and chest x-ray adequate for interpretation during COVID 19 epidemic. RADIOPHARMACEUTICALS:  4.15 mCi Tc-7m MAA IV COMPARISON:  07/26/2020 FINDINGS: Decreased perfusion is noted secondary to emphysematous changes in the right upper lobe. Adequate uptake is noted throughout both lungs without wedge-shaped defect to suggest pulmonary embolism. IMPRESSION: No evidence of pulmonary embolism. Changes consistent with COPD with decreased perfusion in the right  upper lobe. Electronically Signed   By: Inez Catalina M.D.   On: 07/27/2020 17:29   DG CHEST PORT 1 VIEW  Result Date: 07/27/2020 CLINICAL DATA:  65 year old female with acute respiratory failure. EXAM: PORTABLE CHEST 1 VIEW COMPARISON:  Chest radiograph dated 07/27/2020. FINDINGS: Background of chronic interstitial coarsening. No focal consolidation, pleural effusion pneumothorax. Stable cardiomegaly. Atherosclerotic calcification of the aorta. No osseous pathology. Old left rib fractures. IMPRESSION: No active disease. Electronically Signed   By: Anner Crete M.D.   On: 07/27/2020 19:46   DG Chest Port 1 View  Result Date: 07/26/2020 CLINICAL DATA:  Dyspnea EXAM: PORTABLE CHEST 1 VIEW COMPARISON:  06/11/2019 FINDINGS: Cardiomegaly. Scarring and or atelectasis at the bilateral lung bases. Emphysema. The visualized skeletal structures are unremarkable. IMPRESSION: 1.  Cardiomegaly. 2. Scarring and or atelectasis at the bilateral lung bases without acute appearing airspace opacity. 3.  Emphysema. Electronically Signed   By: Eddie Candle M.D.   On: 07/26/2020 13:48   ECHOCARDIOGRAM COMPLETE  Result Date: 07/27/2020    ECHOCARDIOGRAM REPORT   Patient Name:   NASIM GAROFANO Pattison Date of Exam: 07/27/2020 Medical Rec #:  [id number]          Height:       62.0 in Accession #:    [id number]         Weight:       117.4 lb Date of Birth:  08-May-1955           BSA:          1.524 m Patient Age:    65 years           BP:           105/53 mmHg Patient Gender: F                  HR:           80 bpm. Exam Location:  Inpatient Procedure: 2D Echo, Cardiac Doppler and Color Doppler Indications:    CHF  History:        Patient has prior history of Echocardiogram examinations, most                 recent 08/25/2018. COPD; Risk Factors:Hypertension, Dyslipidemia                 and Current Smoker.  Sonographer:  Cammy Brochure Referring Phys: [place]  1. Left ventricular ejection fraction, by  estimation, is 30 to 35%. The left ventricle has moderately decreased function. The left ventricle demonstrates global hypokinesis. The left ventricular internal cavity size was mildly dilated. There is moderate  left ventricular hypertrophy. Left ventricular diastolic parameters are consistent with Grade I diastolic dysfunction (impaired relaxation). Elevated left atrial pressure.  2. Right ventricular systolic function is normal. The right ventricular size is normal.  3. Left atrial size was mildly dilated.  4. The mitral valve is normal in structure. No evidence of mitral valve regurgitation. No evidence of mitral stenosis.  5. The aortic valve has an indeterminant number of cusps. Aortic valve regurgitation is not visualized. No aortic stenosis is present.  6. The inferior vena cava is normal in size with greater than 50% respiratory variability, suggesting right atrial pressure of 3 mmHg. FINDINGS  Left Ventricle: Left ventricular ejection fraction, by estimation, is 30 to 35%. The left ventricle has moderately decreased function. The left ventricle demonstrates global hypokinesis. The left ventricular internal cavity size was mildly dilated. There is moderate left ventricular hypertrophy. Abnormal (paradoxical) septal motion, consistent with left bundle branch block. Left ventricular diastolic parameters are consistent with Grade I diastolic dysfunction (impaired relaxation). Elevated left atrial pressure. Right Ventricle: The right ventricular size is normal. Right ventricular systolic function is normal. Left Atrium: Left atrial size was mildly dilated. Right Atrium: Right atrial size was normal in size. Pericardium: There is no evidence of pericardial effusion. Mitral Valve: The mitral valve is normal in structure. No evidence of mitral valve regurgitation. No evidence of mitral valve stenosis. MV peak gradient, 13.8 mmHg. The mean mitral valve gradient is 5.0 mmHg. Tricuspid Valve: The tricuspid valve is  normal in structure. Tricuspid valve regurgitation is trivial. No evidence of tricuspid stenosis. Aortic Valve: The aortic valve has an indeterminant number of cusps. Aortic valve regurgitation is not visualized. No aortic stenosis is present. Aortic valve mean gradient measures 7.5 mmHg. Aortic valve peak gradient measures 14.3 mmHg. Pulmonic Valve: The pulmonic valve was normal in structure. Pulmonic valve regurgitation is not visualized. No evidence of pulmonic stenosis. Aorta: The aortic root is normal in size and structure. Venous: The inferior vena cava is normal in size with greater than 50% respiratory variability, suggesting right atrial pressure of 3 mmHg. IAS/Shunts: No atrial level shunt detected by color flow Doppler.  LEFT VENTRICLE PLAX 2D LVIDd:         5.30 cm  Diastology LVIDs:         3.70 cm  LV e' medial:    3.81 cm/s LV PW:         1.40 cm  LV E/e' medial:  20.2 LV IVS:        1.60 cm  LV e' lateral:   5.87 cm/s LVOT diam:     1.90 cm  LV E/e' lateral: 13.1 LV SV:         58 LV SV Index:   38 LVOT Area:     2.84 cm  RIGHT VENTRICLE          IVC RV Basal diam:  4.90 cm  IVC diam: 1.40 cm LEFT ATRIUM             Index       RIGHT ATRIUM           Index LA diam:        3.50 cm 2.30 cm/m  RA Area:     13.90 cm LA Vol (A2C):   51.1 ml 33.52 ml/m RA Volume:   34.20 ml  22.44 ml/m LA Vol (A4C):   43.4 ml 28.47 ml/m LA Biplane Vol: 52.1 ml 34.18 ml/m  AORTIC VALVE AV Area (Vmax):    1.68 cm AV Area (Vmean):   1.63 cm AV Area (VTI):     1.67 cm AV Vmax:           189.00 cm/s AV Vmean:          125.000 cm/s AV VTI:            0.347 m AV Peak Grad:      14.3 mmHg AV Mean Grad:      7.5 mmHg LVOT Vmax:         112.00 cm/s LVOT Vmean:        71.800 cm/s LVOT VTI:          0.204 m LVOT/AV VTI ratio: 0.59  AORTA Ao Root diam: 3.10 cm Ao Asc diam:  2.80 cm MITRAL VALVE MV Area (PHT): 3.99 cm     SHUNTS MV Area VTI:   1.67 cm     Systemic VTI:  0.20 m MV Peak grad:  13.8 mmHg    Systemic Diam: 1.90  cm MV Mean grad:  5.0 mmHg MV Vmax:       1.86 m/s MV Vmean:      107.0 cm/s MV Decel Time: 190 msec MV E velocity: 77.10 cm/s MV A velocity: 176.00 cm/s MV E/A ratio:  0.44 Kirk Ruths MD Electronically signed by Kirk Ruths MD Signature Date/Time: 07/27/2020/1:10:21 PM    Final     Telemetry    07/28/20 NSR with PVCs/PACs HR in the 60-80 - Personally Reviewed  ECG    No new tracing as of 07/28/20 - Personally Reviewed  Cardiac Studies   Echo from 07/27/20:    1. Left ventricular ejection fraction, by estimation, is 30 to 35%. The  left ventricle has moderately decreased function. The left ventricle  demonstrates global hypokinesis. The left ventricular internal cavity size  was mildly dilated. There is moderate   left ventricular hypertrophy. Left ventricular diastolic parameters are  consistent with Grade I diastolic dysfunction (impaired relaxation).  Elevated left atrial pressure.   2. Right ventricular systolic function is normal. The right ventricular  size is normal.   3. Left atrial size was mildly dilated.   4. The mitral valve is normal in structure. No evidence of mitral valve  regurgitation. No evidence of mitral stenosis.   5. The aortic valve has an indeterminant number of cusps. Aortic valve  regurgitation is not visualized. No aortic stenosis is present.   6. The inferior vena cava is normal in size with greater than 50%  respiratory variability, suggesting right atrial pressure of 3 mmHg.   Carotid doppler on 03/10/20:   RIGHT CAROTID ARTERY: Minimal scattered plaque of the carotid bulb without flow-limiting stenosis.   RIGHT VERTEBRAL ARTERY:  Antegrade flow.   LEFT CAROTID ARTERY: Mild heterogeneous plaque of the carotid bulb without high-grade stenosis.   LEFT VERTEBRAL ARTERY:  Antegrade flow.   IMPRESSION: No significant stenosis of internal carotid arteries.     Echo from 08/25/2018:    1. The left ventricle has low normal systolic function,  with an ejection  fraction of 50-55%. The cavity size was normal. There is moderately  increased left ventricular wall thickness. Left ventricular diastolic  Doppler parameters  are consistent with  impaired relaxation. Elevated mean left atrial pressure.   2. The right ventricle has normal systolic function. The cavity was  normal. There is no increase in right ventricular wall thickness.   3. Left atrial size was mildly dilated.   4. Right atrial size was mildly dilated.   5. No evidence of mitral valve stenosis.   6. No stenosis of the aortic valve.   7. The aortic root is normal in size and structure.   8. Pulmonary hypertension is indeterminant, inadequate TR jet.    Left cardiac cath 07/12/2013:   Left mainstem: There was no left main.  The LAD and LCx have separate ostia.   Left anterior descending (LAD): Separate ostium.  No angiographic CAD.   Left circumflex (LCx): Separate ostium.  No angiographic CAD.   Right coronary artery (RCA): No angiographic CAD.   Left ventriculography: Left ventricular systolic function is normal, LVEF is estimated at 60-65%, there is no significant mitral regurgitation   Final Conclusions:  No angiographic CAD, normal LV systolic function.  Suspect false positive Cardiolite.  Followup with Dr. Jacinta Shoe.  Patient Profile     65 y.o. female with a hx of oxygen dependent COPD,  tobacco abuse, chronic diastolic heart failure, hypotension, orthostasis, HLD, chronic pain syndrome, depression, anxiety,  hypothyroidism, PVD, carotid artery disease, left subclavian artery stenosis, who is being seen 07/27/2020 for the evaluation of CHF at the request of Dr. Bonner Puna.    Assessment & Plan    1. Acute on chronic systolic and diastolic CHF: -Pt has been lost to follow up since 2015>>>had LHC at that time with no CAD. She presented to [facility] with SOB and LE edema with a BNP at 3606 and CXR with pulmonary edema.  -Echo this admission with EF 30 to 35%, LV global  hypokinesis, moderate LVH, grade 1 DD, elevated LA pressure, RV normal, mild dilatation of LA, no significant valvular disease (EF has dropped from 50 to 55% 08/25/2018 echo), however upon MD review echo imaging felt to be unchanged  -Will plan to continue with soft diuresis with IV Lasix 40mg  BID>>would plan to reduce dose possibly tomorrow  -No room to add GDMT due to soft BP and renal function>>requiring midodrine  -BP today 106/51>>104/54>>102/60>>99/60 -I&O, net neg 1.6L -Weight, 117lb today  -Breathing and edema improved  2. Atypical chest pain with elevated HsT: -Reported 2-3 weeks of intermittent chest pain  -Last cath from 2015 with normal coronary arteries.  -Stable EKG with no acute changes -No plan for further ischemic workup at this time.   3. Acute hypoxic respiratory failure: -Non complaint with home O2>>COPD -Continue with supplemental therapy while inpatient -VQ>>no evidence of PE>>changes consistent with COPD   4. COPD: -Continue current regimen per primary team  -See above   5. AKI: -Creatinine, 2.26 today>>>down from 2.31 yesterday  -Baseline appears to be in the 1.1-1.3 range   6. HLD: -Last LDL, 79 on labs today    Other medical issues include: -Tobacco use -Anxiety/depression -Carotid artery disease   Signed, Kathyrn Drown NP-C [place] Pager: [phone] 07/28/2020, 7:56 AM     For questions or updates, please contact   Please consult [email or web address] for contact info under Cardiology/STEMI.  Patient seen and examined with Kathyrn Drown NP-C.  Agree as above, with the following exceptions and changes as noted below.  Patient seen in her room ready to start working with physical therapy.  Seen sitting on the side of the bed with supplemental oxygen  in place.  Lower extremity edema continues to improve Gen: NAD, CV: RRR, no murmurs, Lungs: Coarse bilaterally, Abd: soft, Extrem: Warm, well perfused, trace edema, Neuro/Psych: alert and oriented x 3,  normal mood and affect. All available labs, radiology testing, previous records reviewed.  Lower extremity edema improving, urine output better today than prior.  Continue IV diuresis.  Suspect much of her presentation is primary pulmonary, on subsequent review of her echocardiogram, RV is mildly dilated with mild dysfunction, and this is contributed to by pulmonary disease.  Probable dietary noncompliance led to heart failure exacerbation but her shortness of breath is acute on chronic, and again her LV function appears unchanged compared to 2020 despite differences in reporting.  If renal function improves and blood pressure is better, could attempt heart failure therapy.  She may eventually need an ischemic work-up for chronic systolic heart failure, this can be completed as an outpatient and we discussed this yesterday.  Elouise Munroe, MD 07/28/20 1:43 PM

## 2020-07-28 NOTE — Progress Notes (Signed)
Palliative Medicine Inpatient Follow Up Note   HPI: 65 y.o. female  with past medical history of significant of chronic pain; continued tobacco use; COPD on home O2; HTN; HLD; hypothyroidism; uterine cancer; anxiety; depression and PVD who was admitted on 07/26/2020 with SOB/Hypoxia. Patient was seen in outpatient  rehab (Dr. Dagoberto Ligas)  on 6/22/202 and was found to be hypoxic and not wearing her O2. Patient was sent by ambulance to the hospital and put on BiPap. Husband reported she had not been wearing her O2 at home and haot been eating well lately, except for drinking mountain dew. Social history is signficant for her friend recently passing away and she has been experiencing significant depression because of it. Upon admission patient was found to be in acute on chronic respiratory failure with hypoxia and hypercapnia. Patient had also been diagnosed with CHF years ago. Palliative care was consulted to discus goals of care, symptom management, and to discuss complex medical decision making.    Today's Discussion (07/28/2020):  *Please note that this is a verbal dictation therefore any spelling or grammatical errors are due to the "[employer] One" system interpretation.  Chart reviewed. I met at bedside with [name] and her spouse, [name]. We reviewed her current medical conditions occlusive of her COPD on 3 to 4 L at home of oxygen, uterine cancer, peripheral vascular disease, and anxiety and depression.  [name] shares with me that she is living in [place], [place].  She and her husband had been getting along okay until the last 2 months or so when one of her very good friends passed away suddenly.  [name] shares that this has been devastating for her as she was able to rely on this friend to aid in her anxiety and depression management.  She goes on to share with me that 2 nephews have also passed away within the last 6 months which contributes to her already marked feeling of  depression.  [name] does not state having any plans to harm herself though she does feel overall pretty down and she realizes it is having an effect on her health.  We discussed how hard it has been for her to get into counseling and that all the psychology offices are full.  I shared that we can continue to try to explore offices near her home to hopefully have her establish as a new patient with someone to better aid in support her.  From a support perspective [name] shares that she does have her husband [name] though there are certain things she would prefer to not discussed with him.  I took the time to listen to her worries and provided therapeutic listening as a form of emotional support.  I asked if [name] would wish for chaplain to stop by and at this time she seems indifferent to it.  She does endorse that she has her own chaplaincy services from her church if needed.  We reviewed the GOLD classification for COPD.  We discussed the progression of illness and how over time often symptoms become resistant to offer therapies.  At that juncture it is evident that the stage of the disease has reached finality and at that juncture it is time to consider hospice.  And expressed understanding.  I presented a MOST form and advanced directives to [place] and [name] this afternoon.  I offered that it is very important to consider what their wishes would be in the event that something imminent or acute happened.  We reviewed  that at this point in time [name] wants to continue with her present level of care which is full code full scope of care though she does realize that she is declining.  I shared that it is very important to consider the long-term and whether or not she would wish to be in and out of the hospital and potentially on machines to keep her alive.  These are considerations which she and her husband will discuss in the future.  I asked [name] if she would be willing to have outpatient palliative  care follow along to continue these very important conversations and offer her additional support.  At this point in time she would be in agreement with this.  Questions and concerns addressed   Objective Assessment: Vital Signs Vitals:   07/28/20 0600 07/28/20 0751  BP: (!) 104/54 (!) 106/51  Pulse: 82 78  Resp: 16 15  Temp: 97.8 F (36.6 C) 97.8 F (36.6 C)  SpO2: 100% 99%    Intake/Output Summary (Last 24 hours) at 07/28/2020 1151 Last data filed at 07/28/2020 0900 Gross per 24 hour  Intake --  Output 1150 ml  Net -1150 ml   Last Weight  Most recent update: 07/28/2020  5:37 AM    Weight  53.2 kg (117 lb 4.6 oz)            Gen:  Caucasian F in NAD HEENT: moist mucous membranes CV: Regular rate and rhythm  PULM: ON 4LPM [place] ABD: soft/nontender  EXT: No edema  Neuro: Alert and oriented x3   SUMMARY OF RECOMMENDATIONS   Full code/full scope of care  Provided a MOST and Advance Directives for review  Upon discharge will need referral to outpatient palliative care  Patient would benefit from referral to psychiatry and psychology to deal with complicated grief  Ongoing incremental palliative support  Time Spent: 70 Greater than 50% of the time was spent in counseling and coordination of care ______________________________________________________________________________________ [place] Team Team Cell Phone: [phone] Please utilize secure chat with additional questions, if there is no response within 30 minutes please call the above phone number  Palliative Medicine Team providers are available by phone from 7am to 7pm daily and can be reached through the team cell phone.  Should this patient require assistance outside of these hours, please call the patient's attending physician.

## 2020-07-28 NOTE — Progress Notes (Signed)
This chaplain responded to PMT consult for spiritual care.  The Pt. is sitting on the side of the bed and enjoying a double serving of lunch.  The chaplain offers a listening presence as the Pt. eats. The chaplain understands "the Pt. describes her depression as connected to the death of a best friend."  "This friend offered a place of connection and a listening presence" for the Pt."  The chaplain affirms the Pt. request for support as she processes her loss and the grief she is experiencing. The Pt. is hoping for a counselor or psychologist referral.   The Pt. describes herself as intently honest in all relationships; "I will always speak the truth."  The Pt. recognizes the companionship and strength she has in her relationship with her husband and her faith.   The Pt. accepts the chaplain invitation for a return spiritual care visit.

## 2020-07-29 DIAGNOSIS — J9622 Acute and chronic respiratory failure with hypercapnia: Secondary | ICD-10-CM

## 2020-07-29 DIAGNOSIS — F32A Depression, unspecified: Secondary | ICD-10-CM

## 2020-07-29 DIAGNOSIS — F419 Anxiety disorder, unspecified: Secondary | ICD-10-CM

## 2020-07-29 LAB — T3, FREE: T3, Free: 0.8 pg/mL — ABNORMAL LOW (ref 2.0–4.4)

## 2020-07-29 MED ORDER — TORSEMIDE 20 MG PO TABS
20.0000 mg | ORAL_TABLET | Freq: Two times a day (BID) | ORAL | Status: DC
  Administered 2020-07-30: 20 mg via ORAL
  Filled 2020-07-29: qty 1

## 2020-07-29 MED ORDER — IPRATROPIUM-ALBUTEROL 0.5-2.5 (3) MG/3ML IN SOLN
3.0000 mL | Freq: Four times a day (QID) | RESPIRATORY_TRACT | Status: DC
  Administered 2020-07-29 – 2020-07-30 (×3): 3 mL via RESPIRATORY_TRACT
  Filled 2020-07-29 (×3): qty 3

## 2020-07-29 MED ORDER — MOMETASONE FURO-FORMOTEROL FUM 200-5 MCG/ACT IN AERO
2.0000 | INHALATION_SPRAY | Freq: Two times a day (BID) | RESPIRATORY_TRACT | Status: DC
  Administered 2020-07-30 – 2020-08-01 (×5): 2 via RESPIRATORY_TRACT
  Filled 2020-07-29: qty 8.8

## 2020-07-29 NOTE — Progress Notes (Signed)
PROGRESS NOTE    [name]  TML:[id number] DOB: 08-22-1955 DOA: 07/26/2020 PCP: [name], [name], MD    Brief Narrative:  Mrs Marshman was admitted to the hospital with the working diagnosis of acute on chronic hypoxemic respiratory failure due to CHF and COPD exacerbation.   65 year old female past medical history for COPD, chronic hypoxic respiratory failure, hypertension, dyslipidemia, hypothyroidism, uterine cancer, peripheral vascular disease and chronic pain syndrome who presented with worsening dyspnea.  During physical therapy she was noted to be hypoxic, worsening dyspnea and transferred to the hospital.  Poor oral intake over the last 2 days leading to her hospitalization.  On her initial physical examination she was in respiratory distress and was placed on BiPAP, her blood pressure 158/57, heart rate 70, respiratory rate 16, temperature 98.2, oxygen saturation 94%.  Her lungs had no wheezing or rhonchi, heart S1-S2, present, rhythmic, abdomen soft, positive bilateral lower extremity edema.  Her venous blood gas pH was 7.27, sodium 137, potassium 4.0, chloride 93, bicarb 30, glucose 1 7, BUN 44, creatinine 2.90, BNP 3606, high sensitive troponin 78-72-52. White cell count 10.7, hemoglobin 12.6, hematocrit 45.0, platelets 355. SARS COVID-19 negative.  Chest radiograph with cardiomegaly, hilar vascular congestion, bibasilar atelectasis.  EKG 87 bpm, rightward axis, left bundle branch block, sinus rhythm with PVCs, poor R wave progression, no significant ST segment or T wave changes.  Patient has received systemic steroids and diuresis with furosemide.  Persistent symptoms, slowly improving..  Assessment & Plan:   Principal Problem:   Acute on chronic respiratory failure with hypoxia and hypercapnia (HCC) Active Problems:   Hypothyroid   Cigarette smoker   Anxiety and depression   Chronic pain   Goals of care, counseling/discussion   Acute kidney injury  ([place])   COPD  GOLD II / still smoking    Idiopathic hypotension   Acute on chronic combined systolic and diastolic CHF (congestive heart failure) ([place])   Palliative care by specialist   Acute on chronic hypoxemic respiratory failure due to COPD exacerbation.  Slowly her symptoms are improving, but not yet back to baseline, decreased ventilation on lung auscultation bilaterally.  Continue medical therapy with systemic and inhaled corticosteroids, aggressive bronchodilator therapy and airway clearing techniques with flutter valve and incentive spirometer. Continue antitussive agents.   2. Acute on chronic systolic heart failure exacerbation. Echocardiogram with LV systolic function reduced to 30 to 35% with global hypokinesis, preserved RV systolic function.  No significant valvular disease.   Urine output over last 24 hrs 2,050 ml, clinically her volume has improved, at the time of my examination no further lower extremity edema or JVD.  Blood pressure 97/66 mmHG  Plan to transition to torsemide in am,  Holding afterload reduction due to risk of worsening hypotension. May start low dose B blocker in am.  Continue on midodrine   3. AKI renal function with serum cr at 2,26 with K at 3,9 and serum bicarbonate at 40. Continue close monitoring of renal function and electrolytes.   Plan to start patient on torsemide in am.   4. HTN. Continue to hold on antihypertensive medications to prevent hypotension  5. Hypothyroid. Continue levothyroxine   6. Depression. Will need outpatient follow up with psychiatry  Continue duloxetine, mirtazapine, alprazolam, pregabalin, quetiapine.    Patient continue to be at high risk for worsening respiratory failure   Status is: Inpatient  Remains inpatient appropriate because:Inpatient level of care appropriate due to severity of illness  Dispo: The patient is  from: Home              Anticipated d/c is to: Home              Patient currently is  not medically stable to d/c.   Difficult to place patient No    DVT prophylaxis: Enoxaparin   Code Status:   full  Family Communication:  I spoke with patient's husband at the bedside, we talked in detail about patient's condition, plan of care and prognosis and all questions were addressed.      Nutrition Status: Nutrition Problem: Inadequate oral intake Etiology: poor appetite, social / environmental circumstances (depression) Signs/Symptoms: per patient/family report Interventions: Magic cup, MVI, Other (Comment) (Double portions at all meals)      Consultants:  Cardiology    Subjective: Patient feeling better, but not yet back to baseline, continue to have dyspnea, no chest pain, Complains from polyuria.   Objective: Vitals:   07/29/20 0500 07/29/20 0557 07/29/20 0748 07/29/20 1242  BP:  112/79 116/82 97/66  Pulse:  78 80 82  Resp:  15 16 16   Temp:  97.8 F (36.6 C) 97.8 F (36.6 C) 98.2 F (36.8 C)  TempSrc:  Oral Oral Oral  SpO2:  98% 97% 90%  Weight: 51.2 kg     Height:        Intake/Output Summary (Last 24 hours) at 07/29/2020 1455 Last data filed at 07/29/2020 1333 Gross per 24 hour  Intake --  Output 2900 ml  Net -2900 ml   Filed Weights   07/27/20 0754 07/28/20 0500 07/29/20 0500  Weight: 53.3 kg 53.2 kg 51.2 kg    Examination:   General: Deconditioned and positive dyspnea at rest.  Neurology: Awake and alert, non focal  E ENT: no pallor, no icterus, oral mucosa moist Cardiovascular: No JVD. S1-S2 present, rhythmic, no gallops, rubs, or murmurs. No lower extremity edema. Pulmonary:  positive breath sounds bilaterally, decreased air movement, no wheezing, scattered rhonchi and rales. Gastrointestinal. Abdomen soft and non tender Skin. No rashes Musculoskeletal: no joint deformities     Data Reviewed: I have personally reviewed following labs and imaging studies  CBC: Recent Labs  Lab 07/26/20 1329 07/26/20 1335 07/27/20 0210  WBC  10.7*  --  7.9  NEUTROABS 8.1*  --   --   HGB 12.6 15.6* 13.2  HCT 45.0 46.0 47.5*  MCV 85.1  --  84.7  PLT 355  --  161   Basic Metabolic Panel: Recent Labs  Lab 07/26/20 1329 07/26/20 1335 07/27/20 0210 07/28/20 0125  NA 137 135 137 139  K 4.0 4.0 4.2 3.7  CL 93*  --  94* 90*  CO2 30  --  31 40*  GLUCOSE 107*  --  129* 160*  BUN 44*  --  39* 51*  CREATININE 2.90*  --  2.31* 2.26*  CALCIUM 9.0  --  9.4 8.8*  MG  --   --   --  1.4*   GFR: Estimated Creatinine Clearance: 20.2 mL/min (A) (by C-G formula based on SCr of 2.26 mg/dL (H)). Liver Function Tests: Recent Labs  Lab 07/26/20 1329  AST 39  ALT 30  ALKPHOS 135*  BILITOT 2.1*  PROT 6.5  ALBUMIN 3.3*   No results for input(s): LIPASE, AMYLASE in the last 168 hours. No results for input(s): AMMONIA in the last 168 hours. Coagulation Profile: No results for input(s): INR, PROTIME in the last 168 hours. Cardiac Enzymes: No results for input(s): CKTOTAL,  CKMB, CKMBINDEX, TROPONINI in the last 168 hours. BNP (last 3 results) No results for input(s): PROBNP in the last 8760 hours. HbA1C: No results for input(s): HGBA1C in the last 72 hours. CBG: No results for input(s): GLUCAP in the last 168 hours. Lipid Profile: Recent Labs    07/28/20 0125  CHOL 129  HDL 28*  LDLCALC 79  TRIG 108  CHOLHDL 4.6   Thyroid Function Tests: Recent Labs    07/28/20 0125 07/28/20 1057  TSH 0.336*  --   FREET4  --  0.87  T3FREE  --  0.8*   Anemia Panel: No results for input(s): VITAMINB12, FOLATE, FERRITIN, TIBC, IRON, RETICCTPCT in the last 72 hours.    Radiology Studies: I have reviewed all of the imaging during this hospital visit personally     Scheduled Meds:  docusate sodium  100 mg Oral BID   DULoxetine  60 mg Oral BID   enoxaparin (LOVENOX) injection  30 mg Subcutaneous Q24H   furosemide  40 mg Intravenous BID   levothyroxine  50 mcg Oral QAC breakfast   midodrine  5 mg Oral TID WC   mirtazapine  15  mg Oral QHS   multivitamin with minerals  1 tablet Oral Daily   nicotine  21 mg Transdermal Daily   pantoprazole  40 mg Oral BID AC   predniSONE  40 mg Oral Q breakfast   pregabalin  50 mg Oral TID   QUEtiapine  50-100 mg Oral QHS   sodium chloride flush  3 mL Intravenous Q12H   [START ON 07/30/2020] torsemide  20 mg Oral BID   Continuous Infusions:   LOS: 3 days        Warda Mcqueary Gerome Apley, MD

## 2020-07-29 NOTE — Progress Notes (Signed)
Palliative Medicine Inpatient Follow Up Note  HPI: 65 y.o. female  with past medical history of significant of chronic pain; continued tobacco use; COPD on home O2; HTN; HLD; hypothyroidism; uterine cancer; anxiety; depression and PVD who was admitted on 07/26/2020 with SOB/Hypoxia. Patient was seen in outpatient  rehab (Dr. Dagoberto Ligas)  on 6/22/202 and was found to be hypoxic and not wearing her O2. Patient was sent by ambulance to the hospital and put on BiPap. Husband reported she had not been wearing her O2 at home and haot been eating well lately, except for drinking mountain dew. Social history is signficant for her friend recently passing away and she has been experiencing significant depression because of it. Upon admission patient was found to be in acute on chronic respiratory failure with hypoxia and hypercapnia. Patient had also been diagnosed with CHF years ago. Palliative care was consulted to discus goals of care, symptom management, and to discuss complex medical decision making.    Today's Discussion (07/29/2020):  *Please note that this is a verbal dictation therefore any spelling or grammatical errors are due to the "[place] [employer]" system interpretation.  Chart reviewed.   I met at bedside with Jaymes and her spouse, Remo Lipps.  We talked for quite some time again today regarding the grief she is stricken by after the loss of her friend Jonie.  She shares that it is very difficult for her to come to terms with that as there has been no service to commemorate Jonie's life leaving her without closure about her very close friend's death.  We also reviewed the other deaths which have occurred in her family in the last 6 months and she expresses fear of "losing someone else".  I spent a good amount of time listening to her express these fears and allowing her to speak.  Reviewed how hard it has been for Hanae to find a therapist in her area given that no one is "excepting new patients".   I shared with her that I can certainly request that a referral is made upon discharge as this would hopefully ensure she is able to see someone when she discharges from the hospital.  Reviewed the things that bring Leslea Vowles which are her dog is a [place] shepherd mix.  She also enjoys spending time with her husband, Remo Lipps.   Discussed that we would stop by again tomorrow to offer additional support to Rache that does appear that her plan for discharge will be likely at the beginning of the oncoming week.  Questions and concerns addressed   Objective Assessment: Vital Signs Vitals:   07/29/20 0748 07/29/20 1242  BP: 116/82 97/66  Pulse: 80 82  Resp: 16 16  Temp: 97.8 F (36.6 C) 98.2 F (36.8 C)  SpO2: 97% 90%    Intake/Output Summary (Last 24 hours) at 07/29/2020 1352 Last data filed at 07/29/2020 1333 Gross per 24 hour  Intake --  Output 2900 ml  Net -2900 ml    Last Weight  Most recent update: 07/29/2020  6:43 AM    Weight  51.2 kg (112 lb 14 oz)            Gen:  Caucasian F in NAD HEENT: moist mucous membranes CV: Regular rate and rhythm  PULM: ON 4LPM [place] ABD: soft/nontender  EXT: No edema  Neuro: Alert and oriented x3   SUMMARY OF RECOMMENDATIONS   Full code/full scope of care  Provided a MOST and Advance Directives for review  Upon discharge will need referral to outpatient palliative care  Patient would benefit from referral to psychiatry and psychology to deal with complicated grief  Ongoing incremental palliative support  Time Spent: 45 Greater than 50% of the time was spent in counseling and coordination of care ______________________________________________________________________________________ [place] Team Team Cell Phone: [phone] Please utilize secure chat with additional questions, if there is no response within 30 minutes please call the above phone number  Palliative Medicine Team providers  are available by phone from 7am to 7pm daily and can be reached through the team cell phone.  Should this patient require assistance outside of these hours, please call the patient's attending physician.

## 2020-07-29 NOTE — Progress Notes (Signed)
Progress Note  Patient Name: [name] Date of Encounter: 07/29/2020  [facility] Cardiologist: Rozann Lesches, MD   Subjective   No acute events overnight.  Husband is at the bedside.  She is eating breakfast this morning sitting at 60 degrees.  Tells me she is feeling better.  Inpatient Medications    Scheduled Meds:  docusate sodium  100 mg Oral BID   DULoxetine  60 mg Oral BID   enoxaparin (LOVENOX) injection  30 mg Subcutaneous Q24H   furosemide  40 mg Intravenous BID   levothyroxine  50 mcg Oral QAC breakfast   midodrine  5 mg Oral TID WC   mirtazapine  15 mg Oral QHS   multivitamin with minerals  1 tablet Oral Daily   nicotine  21 mg Transdermal Daily   pantoprazole  40 mg Oral BID AC   predniSONE  40 mg Oral Q breakfast   pregabalin  50 mg Oral TID   QUEtiapine  50-100 mg Oral QHS   sodium chloride flush  3 mL Intravenous Q12H   [START ON 07/30/2020] torsemide  20 mg Oral BID   Continuous Infusions:  PRN Meds: acetaminophen **OR** acetaminophen, albuterol, ALPRAZolam, bisacodyl, ondansetron **OR** ondansetron (ZOFRAN) IV, oxyCODONE, polyethylene glycol   Vital Signs    Vitals:   07/29/20 0042 07/29/20 0500 07/29/20 0557 07/29/20 0748  BP:   112/79 116/82  Pulse: 80  78 80  Resp: 18  15 16   Temp:   97.8 F (36.6 C) 97.8 F (36.6 C)  TempSrc:   Oral Oral  SpO2: 97%  98% 97%  Weight:  51.2 kg    Height:        Intake/Output Summary (Last 24 hours) at 07/29/2020 0909 Last data filed at 07/29/2020 0644 Gross per 24 hour  Intake --  Output 1500 ml  Net -1500 ml   Last 3 Weights 07/29/2020 07/28/2020 07/27/2020  Weight (lbs) 112 lb 14 oz 117 lb 4.6 oz 117 lb 6.4 oz  Weight (kg) 51.2 kg 53.2 kg 53.252 kg      Telemetry    Sinus rhythm- Personally Reviewed  ECG    No new- Personally Reviewed  Physical Exam   GEN: No acute distress.   Neck: JVD to mid neck at 60 degrees in bed Cardiac: RRR, no murmurs, rubs, or gallops.  Respiratory:  Poor air movement to bilateral bases.  Faint wheezing at end expiratory phase.  No obvious crackles.   GI: Soft, nontender, non-distended  MS: No edema; No deformity. Neuro:  Nonfocal  Psych: Normal affect   Labs    High Sensitivity Troponin:   Recent Labs  Lab 07/26/20 1329 07/26/20 1546 07/27/20 1507  TROPONINIHS 78* 72* 52*      Chemistry Recent Labs  Lab 07/26/20 1329 07/26/20 1335 07/27/20 0210 07/28/20 0125  NA 137 135 137 139  K 4.0 4.0 4.2 3.7  CL 93*  --  94* 90*  CO2 30  --  31 40*  GLUCOSE 107*  --  129* 160*  BUN 44*  --  39* 51*  CREATININE 2.90*  --  2.31* 2.26*  CALCIUM 9.0  --  9.4 8.8*  PROT 6.5  --   --   --   ALBUMIN 3.3*  --   --   --   AST 39  --   --   --   ALT 30  --   --   --   ALKPHOS 135*  --   --   --  BILITOT 2.1*  --   --   --   GFRNONAA 18*  --  23* 24*  ANIONGAP 14  --  12 9     Hematology Recent Labs  Lab 07/26/20 1329 07/26/20 1335 07/27/20 0210  WBC 10.7*  --  7.9  RBC 5.29*  --  5.61*  HGB 12.6 15.6* 13.2  HCT 45.0 46.0 47.5*  MCV 85.1  --  84.7  MCH 23.8*  --  23.5*  MCHC 28.0*  --  27.8*  RDW 20.9*  --  20.8*  PLT 355  --  376    BNP Recent Labs  Lab 07/26/20 1329  BNP 3,606.8*     DDimer  Recent Labs  Lab 07/26/20 1329  DDIMER 2.00*     Radiology    NM Pulmonary Perf and Vent  Result Date: 07/27/2020 CLINICAL DATA:  Positive D-dimer and shortness of breath, initial encounter EXAM: NUCLEAR MEDICINE PERFUSION LUNG SCAN TECHNIQUE: Perfusion images were obtained in multiple projections after intravenous injection of radiopharmaceutical. Ventilation scans intentionally deferred if perfusion scan and chest x-ray adequate for interpretation during COVID 19 epidemic. RADIOPHARMACEUTICALS:  4.15 mCi Tc-58m MAA IV COMPARISON:  07/26/2020 FINDINGS: Decreased perfusion is noted secondary to emphysematous changes in the right upper lobe. Adequate uptake is noted throughout both lungs without wedge-shaped defect to  suggest pulmonary embolism. IMPRESSION: No evidence of pulmonary embolism. Changes consistent with COPD with decreased perfusion in the right upper lobe. Electronically Signed   By: Inez Catalina M.D.   On: 07/27/2020 17:29   DG CHEST PORT 1 VIEW  Result Date: 07/27/2020 CLINICAL DATA:  65 year old female with acute respiratory failure. EXAM: PORTABLE CHEST 1 VIEW COMPARISON:  Chest radiograph dated 07/27/2020. FINDINGS: Background of chronic interstitial coarsening. No focal consolidation, pleural effusion pneumothorax. Stable cardiomegaly. Atherosclerotic calcification of the aorta. No osseous pathology. Old left rib fractures. IMPRESSION: No active disease. Electronically Signed   By: Anner Crete M.D.   On: 07/27/2020 19:46   ECHOCARDIOGRAM COMPLETE  Result Date: 07/27/2020    ECHOCARDIOGRAM REPORT   Patient Name:   [name] Date of Exam: 07/27/2020 Medical Rec #:  [id number]          Height:       62.0 in Accession #:    [id number]         Weight:       117.4 lb Date of Birth:  Oct 31, 1955           BSA:          1.524 m Patient Age:    73 years           BP:           105/53 mmHg Patient Gender: F                  HR:           80 bpm. Exam Location:  Inpatient Procedure: 2D Echo, Cardiac Doppler and Color Doppler Indications:    CHF  History:        Patient has prior history of Echocardiogram examinations, most                 recent 08/25/2018. COPD; Risk Factors:Hypertension, Dyslipidemia                 and Current Smoker.  Sonographer:    Cammy Brochure Referring Phys: [place]  1. Left ventricular ejection fraction, by estimation, is 30  to 35%. The left ventricle has moderately decreased function. The left ventricle demonstrates global hypokinesis. The left ventricular internal cavity size was mildly dilated. There is moderate  left ventricular hypertrophy. Left ventricular diastolic parameters are consistent with Grade I diastolic dysfunction (impaired  relaxation). Elevated left atrial pressure.  2. Right ventricular systolic function is normal. The right ventricular size is normal.  3. Left atrial size was mildly dilated.  4. The mitral valve is normal in structure. No evidence of mitral valve regurgitation. No evidence of mitral stenosis.  5. The aortic valve has an indeterminant number of cusps. Aortic valve regurgitation is not visualized. No aortic stenosis is present.  6. The inferior vena cava is normal in size with greater than 50% respiratory variability, suggesting right atrial pressure of 3 mmHg. FINDINGS  Left Ventricle: Left ventricular ejection fraction, by estimation, is 30 to 35%. The left ventricle has moderately decreased function. The left ventricle demonstrates global hypokinesis. The left ventricular internal cavity size was mildly dilated. There is moderate left ventricular hypertrophy. Abnormal (paradoxical) septal motion, consistent with left bundle branch block. Left ventricular diastolic parameters are consistent with Grade I diastolic dysfunction (impaired relaxation). Elevated left atrial pressure. Right Ventricle: The right ventricular size is normal. Right ventricular systolic function is normal. Left Atrium: Left atrial size was mildly dilated. Right Atrium: Right atrial size was normal in size. Pericardium: There is no evidence of pericardial effusion. Mitral Valve: The mitral valve is normal in structure. No evidence of mitral valve regurgitation. No evidence of mitral valve stenosis. MV peak gradient, 13.8 mmHg. The mean mitral valve gradient is 5.0 mmHg. Tricuspid Valve: The tricuspid valve is normal in structure. Tricuspid valve regurgitation is trivial. No evidence of tricuspid stenosis. Aortic Valve: The aortic valve has an indeterminant number of cusps. Aortic valve regurgitation is not visualized. No aortic stenosis is present. Aortic valve mean gradient measures 7.5 mmHg. Aortic valve peak gradient measures 14.3 mmHg.  Pulmonic Valve: The pulmonic valve was normal in structure. Pulmonic valve regurgitation is not visualized. No evidence of pulmonic stenosis. Aorta: The aortic root is normal in size and structure. Venous: The inferior vena cava is normal in size with greater than 50% respiratory variability, suggesting right atrial pressure of 3 mmHg. IAS/Shunts: No atrial level shunt detected by color flow Doppler.  LEFT VENTRICLE PLAX 2D LVIDd:         5.30 cm  Diastology LVIDs:         3.70 cm  LV e' medial:    3.81 cm/s LV PW:         1.40 cm  LV E/e' medial:  20.2 LV IVS:        1.60 cm  LV e' lateral:   5.87 cm/s LVOT diam:     1.90 cm  LV E/e' lateral: 13.1 LV SV:         58 LV SV Index:   38 LVOT Area:     2.84 cm  RIGHT VENTRICLE          IVC RV Basal diam:  4.90 cm  IVC diam: 1.40 cm LEFT ATRIUM             Index       RIGHT ATRIUM           Index LA diam:        3.50 cm 2.30 cm/m  RA Area:     13.90 cm LA Vol (A2C):   51.1 ml 33.52 ml/m RA Volume:  34.20 ml  22.44 ml/m LA Vol (A4C):   43.4 ml 28.47 ml/m LA Biplane Vol: 52.1 ml 34.18 ml/m  AORTIC VALVE AV Area (Vmax):    1.68 cm AV Area (Vmean):   1.63 cm AV Area (VTI):     1.67 cm AV Vmax:           189.00 cm/s AV Vmean:          125.000 cm/s AV VTI:            0.347 m AV Peak Grad:      14.3 mmHg AV Mean Grad:      7.5 mmHg LVOT Vmax:         112.00 cm/s LVOT Vmean:        71.800 cm/s LVOT VTI:          0.204 m LVOT/AV VTI ratio: 0.59  AORTA Ao Root diam: 3.10 cm Ao Asc diam:  2.80 cm MITRAL VALVE MV Area (PHT): 3.99 cm     SHUNTS MV Area VTI:   1.67 cm     Systemic VTI:  0.20 m MV Peak grad:  13.8 mmHg    Systemic Diam: 1.90 cm MV Mean grad:  5.0 mmHg MV Vmax:       1.86 m/s MV Vmean:      107.0 cm/s MV Decel Time: 190 msec MV E velocity: 77.10 cm/s MV A velocity: 176.00 cm/s MV E/A ratio:  0.44 Kirk Ruths MD Electronically signed by Kirk Ruths MD Signature Date/Time: 07/27/2020/1:10:21 PM    Final     Cardiac Studies   No new  Assessment &  Plan    Ms. Kitchens is a 65 year old woman with oxygen dependent COPD, tobacco abuse, chronic diastolic heart failure, anxiety and depression, chronic pain syndrome, carotid artery disease, left subclavian artery stenosis who was initially seen June 23 for evaluation of acute on chronic diastolic heart failure.  She has been diuresed with IV Lasix for the last several days and is improving.  #Acute on chronic diastolic heart failure NYHA class II-III.  Difficult to decipher how much of her symptoms related to heart failure versus her severe COPD.  She is still slightly volume overloaded on my physical exam this morning with JVD to the mid neck. I think we should continue with IV diuretics through today and then transition to torsemide tomorrow. Unable to add goal-directed medical therapy because of her blood pressures and poor renal function  #Acute hypoxic respiratory failure and COPD Requiring oxygen     For questions or updates, please contact [place] Please consult [email or web address] for contact info under        Signed, Vickie Epley, MD  07/29/2020, 9:09 AM

## 2020-07-29 NOTE — TOC Initial Note (Addendum)
Transition of Care [facility]) - Initial/Assessment Note    Patient Details  Name: [name] MRN: [id number] Date of Birth: [birth year]  Transition of [facility]) CM/SW Contact:    Carles Collet, RN Phone Number: 07/29/2020, 12:04 PM  Clinical Narrative:     Damaris Schooner w patient at bedside. She states that she uses [employer] for home oxygen, at 3L at home. She has RW, 3/1 at home. Declines needs for additional DME. She has used HH in the past around 2 years ago, does not have preference for [facility] agency. Discussed Medicare ratings. Referral pending to [facility].  Discussed outpatient palliative referral and agencies, referral placed to [facility].  TOC will continue to follow.              [facility] accepted   Expected Discharge Plan: [place] Barriers to Discharge: Continued Medical Work up   Patient Goals and CMS Choice Patient states their goals for this hospitalization and ongoing recovery are:: to go home CMS Medicare.gov Compare Post Acute Care list provided to:: Patient Choice offered to / list presented to : Patient  Expected Discharge Plan and Services Expected Discharge Plan: [place]   Discharge Planning Services: CM Consult Post Acute Care Choice: [place] arrangements for the past 2 months: Single Family Home                 DME Arranged: N/A         HH Arranged: PT, OT HH Agency: [place] Date HH Agency Contacted: 07/29/20 Time HH Agency Contacted: 1203 Representative spoke with at [place]: [place] (pending)  Prior Living Arrangements/Services Living arrangements for the past 2 months: [place] Lives with:: Spouse Patient language and need for interpreter reviewed:: Yes Do you feel safe going back to the place where you live?: Yes      Need for Family Participation in Patient Care: Yes (Comment) Care giver support system in place?: Yes (comment)   Criminal Activity/Legal Involvement  Pertinent to Current Situation/Hospitalization: No - Comment as needed  Activities of Daily Living      Permission Sought/Granted      Share Information with NAME: [place] Steven           Emotional Assessment   Attitude/Demeanor/Rapport: Apprehensive Affect (typically observed): Depressed Orientation: : Oriented to Self, Oriented to Place, Oriented to  Time, Oriented to Situation Alcohol / Substance Use: Tobacco Use Psych Involvement: No (comment)  Admission diagnosis:  Hypercarbia [R06.89] Positive D dimer [R79.89] COPD exacerbation ([place]) [J44.1] AKI (acute kidney injury) ([place]) [N17.9] Acute on chronic respiratory failure with hypoxia and hypercapnia (HCC) [J96.21, J96.22] Congestive heart failure, unspecified HF chronicity, unspecified heart failure type ([place]) [I50.9] Patient Active Problem List   Diagnosis Date Noted   Palliative care by specialist    Acute on chronic respiratory failure with hypoxia and hypercapnia ([place]) 07/26/2020   Acute on chronic combined systolic and diastolic CHF (congestive heart failure) ([place]) 07/26/2020   Idiopathic hypotension 10/08/2019   Encounter for long-term use of opiate analgesic 03/12/2019   IDA (iron deficiency anemia) 02/17/2019   Gastritis and gastroduodenitis 02/17/2019   COPD  GOLD II / still smoking  12/08/2018   Chronic respiratory failure with hypoxia and hypercapnia ([place]) 12/08/2018   Radiculopathy due to lumbar intervertebral disc disorder 10/29/2018   Sciatica of left side 10/29/2018   Anemia    Gastrointestinal hemorrhage    Elevated LFTs    Hypokalemia 08/24/2018  COPD exacerbation ([place]) 03/18/2014   Acute kidney injury ([place]) 03/18/2014   Acute respiratory failure with hypoxia ([place]) 03/18/2014   CAP (community acquired pneumonia) 03/17/2014   Pneumonia 03/17/2014   HTN (hypertension) 12/03/2012   Sepsis ([place]) 02/21/2012   PNA (pneumonia) 02/21/2012   Adrenal insufficiency ([place]) 02/21/2012   Subclavian steal  syndrome 02/21/2012   Chronic pain 11/22/2010   Migraine headache 11/22/2010   Gastroesophageal reflux disease 11/22/2010   Goals of care, counseling/discussion 11/22/2010   Cerebrovascular disease 07/24/2010   Anxiety and depression    Hyperlipidemia    Hypothyroid    Cigarette smoker    PCP:  [name], [name], MD Pharmacy:   [place], [place] [address] Phone: [phone] Fax: [phone]     Social Determinants of Health (SDOH) Interventions    Readmission Risk Interventions Readmission Risk Prevention Plan 08/26/2018  Transportation Screening Complete  Home Care Screening Complete  Medication Review (RN CM) Complete  Some recent data might be hidden

## 2020-07-29 NOTE — Progress Notes (Signed)
[facility] ([facility])  Hospital Liaison RN note         Notified by TOC manager of patient/family request for [facility] Palliative services at home after discharge.              [facility] Palliative team will follow up with patient after discharge.         Please call with any hospice or palliative related questions.         Thank you for the opportunity to participate in this patient's care.     Chrislyn King, BSN, RN [facility] Hospital Liaison (listed on AMION under Hospice/[facility])    [phone] (24h on call)    

## 2020-07-30 DIAGNOSIS — J441 Chronic obstructive pulmonary disease with (acute) exacerbation: Secondary | ICD-10-CM

## 2020-07-30 LAB — BASIC METABOLIC PANEL
Anion gap: 6 (ref 5–15)
BUN: 42 mg/dL — ABNORMAL HIGH (ref 8–23)
CO2: 46 mmol/L — ABNORMAL HIGH (ref 22–32)
Calcium: 9 mg/dL (ref 8.9–10.3)
Chloride: 90 mmol/L — ABNORMAL LOW (ref 98–111)
Creatinine, Ser: 1.3 mg/dL — ABNORMAL HIGH (ref 0.44–1.00)
GFR, Estimated: 46 mL/min — ABNORMAL LOW (ref >60)
Glucose, Bld: 98 mg/dL (ref 70–99)
Potassium: 3.8 mmol/L (ref 3.5–5.1)
Sodium: 142 mmol/L (ref 135–145)

## 2020-07-30 MED ORDER — TORSEMIDE 20 MG PO TABS
20.0000 mg | ORAL_TABLET | Freq: Two times a day (BID) | ORAL | Status: DC
  Administered 2020-07-31 – 2020-08-01 (×3): 20 mg via ORAL
  Filled 2020-07-30 (×4): qty 1

## 2020-07-30 MED ORDER — ACETAZOLAMIDE 250 MG PO TABS
250.0000 mg | ORAL_TABLET | Freq: Two times a day (BID) | ORAL | Status: AC
  Administered 2020-07-30 (×2): 250 mg via ORAL
  Filled 2020-07-30 (×2): qty 1

## 2020-07-30 MED ORDER — IPRATROPIUM-ALBUTEROL 0.5-2.5 (3) MG/3ML IN SOLN
3.0000 mL | Freq: Three times a day (TID) | RESPIRATORY_TRACT | Status: DC
  Administered 2020-07-30 – 2020-08-01 (×5): 3 mL via RESPIRATORY_TRACT
  Filled 2020-07-30 (×8): qty 3

## 2020-07-30 NOTE — Progress Notes (Signed)
Progress Note  Patient Name: [name] Date of Encounter: 07/30/2020  [facility] Cardiologist: Rozann Lesches, MD   Subjective   No acute events overnight.  Breathing is better. She is receiving breathing treatment when I arrived.  Inpatient Medications    Scheduled Meds:  docusate sodium  100 mg Oral BID   DULoxetine  60 mg Oral BID   enoxaparin (LOVENOX) injection  30 mg Subcutaneous Q24H   ipratropium-albuterol  3 mL Nebulization Q6H   levothyroxine  50 mcg Oral QAC breakfast   midodrine  5 mg Oral TID WC   mirtazapine  15 mg Oral QHS   mometasone-formoterol  2 puff Inhalation BID   multivitamin with minerals  1 tablet Oral Daily   nicotine  21 mg Transdermal Daily   pantoprazole  40 mg Oral BID AC   predniSONE  40 mg Oral Q breakfast   pregabalin  50 mg Oral TID   QUEtiapine  50-100 mg Oral QHS   sodium chloride flush  3 mL Intravenous Q12H   torsemide  20 mg Oral BID   Continuous Infusions:  PRN Meds: acetaminophen **OR** acetaminophen, albuterol, ALPRAZolam, bisacodyl, ondansetron **OR** ondansetron (ZOFRAN) IV, oxyCODONE, polyethylene glycol   Vital Signs    Vitals:   07/30/20 0500 07/30/20 0554 07/30/20 0731 07/30/20 0802  BP:  120/67 (!) 119/98   Pulse:  80 93   Resp:  15 18   Temp:  98 F (36.7 C) 98 F (36.7 C)   TempSrc:  Oral Oral   SpO2:  100% 100% 99%  Weight: 53.7 kg     Height:        Intake/Output Summary (Last 24 hours) at 07/30/2020 0806 Last data filed at 07/29/2020 1848 Gross per 24 hour  Intake --  Output 2450 ml  Net -2450 ml    Last 3 Weights 07/30/2020 07/29/2020 07/28/2020  Weight (lbs) 118 lb 6.2 oz 112 lb 14 oz 117 lb 4.6 oz  Weight (kg) 53.7 kg 51.2 kg 53.2 kg      Telemetry    Sinus rhythm- Personally Reviewed  ECG    No new- Personally Reviewed  Physical Exam   GEN: No acute distress.   Neck: JVD to just above clavicle Cardiac: RRR, no murmurs, rubs, or gallops.  Respiratory: No IWOB. No  crackles. GI: Soft, nontender, non-distended  MS: No edema; No deformity. Neuro:  Nonfocal  Psych: Normal affect   Labs    High Sensitivity Troponin:   Recent Labs  Lab 07/26/20 1329 07/26/20 1546 07/27/20 1507  TROPONINIHS 78* 72* 52*       Chemistry Recent Labs  Lab 07/26/20 1329 07/26/20 1335 07/27/20 0210 07/28/20 0125 07/30/20 0140  NA 137   < > 137 139 142  K 4.0   < > 4.2 3.7 3.8  CL 93*  --  94* 90* 90*  CO2 30  --  31 40* 46*  GLUCOSE 107*  --  129* 160* 98  BUN 44*  --  39* 51* 42*  CREATININE 2.90*  --  2.31* 2.26* 1.30*  CALCIUM 9.0  --  9.4 8.8* 9.0  PROT 6.5  --   --   --   --   ALBUMIN 3.3*  --   --   --   --   AST 39  --   --   --   --   ALT 30  --   --   --   --   [facility]  135*  --   --   --   --   BILITOT 2.1*  --   --   --   --   GFRNONAA 18*  --  23* 24* 46*  ANIONGAP 14  --  12 9 6    < > = values in this interval not displayed.      Hematology Recent Labs  Lab 07/26/20 1329 07/26/20 1335 07/27/20 0210  WBC 10.7*  --  7.9  RBC 5.29*  --  5.61*  HGB 12.6 15.6* 13.2  HCT 45.0 46.0 47.5*  MCV 85.1  --  84.7  MCH 23.8*  --  23.5*  MCHC 28.0*  --  27.8*  RDW 20.9*  --  20.8*  PLT 355  --  376     BNP Recent Labs  Lab 07/26/20 1329  BNP 3,606.8*      DDimer  Recent Labs  Lab 07/26/20 1329  DDIMER 2.00*      Radiology    No results found.  Cardiac Studies   No new  Assessment & Plan    Ms. Biscardi is a 65 year old woman with oxygen dependent COPD, tobacco abuse, chronic diastolic heart failure, anxiety and depression, chronic pain syndrome, carotid artery disease, left subclavian artery stenosis who was initially seen June 23 for evaluation of acute on chronic diastolic heart failure.  She has been diuresed with IV Lasix for the last several days and has improved.  #Acute on chronic diastolic heart failure NYHA class II-III.  Difficult to decipher how much of her symptoms related to heart failure versus her  severe COPD.  Nearing euvolemia. I think we are at a place to transition to oral diuretics. - torsemide 20mg  PO BID  #Acute hypoxic respiratory failure and COPD Requiring oxygen     For questions or updates, please contact [place] Please consult [email or web address] for contact info under        Signed, Vickie Epley, MD  07/30/2020, 8:06 AM

## 2020-07-30 NOTE — Progress Notes (Signed)
PROGRESS NOTE    [name]  IHK:[id number] DOB: 05-23-1955 DOA: 07/26/2020 PCP: [name], [name], MD    Brief Narrative:  [name] was admitted to the hospital with the working diagnosis of acute on chronic hypoxemic respiratory failure due to CHF and COPD exacerbation.   65 year old female past medical history for COPD, chronic hypoxic respiratory failure, hypertension, dyslipidemia, hypothyroidism, uterine cancer, peripheral vascular disease and chronic pain syndrome who presented with worsening dyspnea.  During physical therapy she was noted to be hypoxic, worsening dyspnea and transferred to the hospital.  Poor oral intake over the last 2 days leading to her hospitalization.  On her initial physical examination she was in respiratory distress and was placed on BiPAP, her blood pressure 158/57, heart rate 70, respiratory rate 16, temperature 98.2, oxygen saturation 94%.  Her lungs had no wheezing or rhonchi, heart S1-S2, present, rhythmic, abdomen soft, positive bilateral lower extremity edema.  Her venous blood gas pH was 7.27, sodium 137, potassium 4.0, chloride 93, bicarb 30, glucose 1 7, BUN 44, creatinine 2.90, BNP 3606, high sensitive troponin 78-72-52. White cell count 10.7, hemoglobin 12.6, hematocrit 45.0, platelets 355. SARS COVID-19 negative.  Chest radiograph with cardiomegaly, hilar vascular congestion, bibasilar atelectasis.  EKG 87 bpm, rightward axis, left bundle branch block, sinus rhythm with PVCs, poor R wave progression, no significant ST segment or T wave changes.  Patient has received systemic steroids and diuresis with furosemide.  Persistent symptoms, slowly improving..  Assessment & Plan:   Principal Problem:   Acute on chronic respiratory failure with hypoxia and hypercapnia (HCC) Active Problems:   Hypothyroid   Cigarette smoker   Anxiety and depression   Chronic pain   Goals of care, counseling/discussion   Acute kidney injury  ([place])   COPD  GOLD II / still smoking    Idiopathic hypotension   Acute on chronic combined systolic and diastolic CHF (congestive heart failure) (HCC)   Palliative care by specialist   Acute on chronic hypoxemic respiratory failure due to COPD exacerbation /acute on chronic combined systolic/diastolic CHF Slowly her symptoms are improving, but not yet back to baseline, decreased ventilation on lung auscultation bilaterally.  Continue medical therapy with systemic and inhaled corticosteroids, aggressive bronchodilator therapy and airway clearing techniques with flutter valve and incentive spirometer. Continue antitussive agents.  -Continue with prednisone -Please see discussion below under CHF exacerbation  2. Acute on chronic combined diastolic/systolic heart failure exacerbation. Echocardiogram with LV systolic function reduced to 30 to 35% with global hypokinesis, preserved RV systolic function.  And grade 1 diastolic dysfunction No significant valvular disease.   Management per cardiology, initially on IV diuresis, currently transitioned to torsemide, blood pressure remains low  prohibiting starting any beta-blockers, Entresto or Aldactone . Blood pressure 95/48 mmHG  Plan to transition to torsemide Holding afterload reduction due to risk of worsening hypotension. Continue on midodrine   3. AKI r -Renal function significantly elevated at 2.9 on admission, this has improved with diuresis, is 1.3 today, but bicarb trending up due to contraction alkalosis, will hold evening dose of torsemide and will give acetazolamide over next 24 hours   4. HTN. Continue to hold on antihypertensive medications to prevent hypotension  5. Hypothyroid. Continue levothyroxine   6. Depression. Will need outpatient follow up with psychiatry  Continue duloxetine, mirtazapine, alprazolam, pregabalin, quetiapine.    Patient continue to be at high risk for worsening respiratory failure   Status is:  Inpatient  Remains inpatient appropriate because:Inpatient level of  care appropriate due to severity of illness  Dispo: The patient is from: Home              Anticipated d/c is to: Home              Patient currently is not medically stable to d/c.   Difficult to place patient No    DVT prophylaxis: Enoxaparin   Code Status:   full  Family Communication:  I spoke with patient's husband at the bedside, we talked in detail about patient's condition, plan of care and prognosis and all questions were addressed.      Nutrition Status: Nutrition Problem: Inadequate oral intake Etiology: poor appetite, social / environmental circumstances (depression) Signs/Symptoms: per patient/family report Interventions: Magic cup, MVI, Other (Comment) (Double portions at all meals)      Consultants:  Cardiology    Subjective:  Patient reports she is feeling better, dyspnea still present, but has improved.    Objective: Vitals:   07/30/20 0554 07/30/20 0731 07/30/20 0802 07/30/20 1135  BP: 120/67 (!) 119/98  (!) 95/48  Pulse: 80 93  89  Resp: 15 18  20   Temp: 98 F (36.7 C) 98 F (36.7 C)  98.9 F (37.2 C)  TempSrc: Oral Oral  Oral  SpO2: 100% 100% 99% 99%  Weight:      Height:        Intake/Output Summary (Last 24 hours) at 07/30/2020 1150 Last data filed at 07/30/2020 0946 Gross per 24 hour  Intake 240 ml  Output 2450 ml  Net -2210 ml   Filed Weights   07/28/20 0500 07/29/20 0500 07/30/20 0500  Weight: 53.2 kg 51.2 kg 53.7 kg    Examination:   Awake Alert, Oriented X 3, No new F.N deficits, Normal affect Symmetrical Chest wall movement, diminished air entry at the bases, scattered rhonchi RRR,No Gallops,Rubs or new Murmurs, No Parasternal Heave +ve B.Sounds, Abd Soft, No tenderness, No rebound - guarding or rigidity. No Cyanosis, Clubbing or edema, No new Rash or bruise     Data Reviewed: I have personally reviewed following labs and imaging  studies  CBC: Recent Labs  Lab 07/26/20 1329 07/26/20 1335 07/27/20 0210  WBC 10.7*  --  7.9  NEUTROABS 8.1*  --   --   HGB 12.6 15.6* 13.2  HCT 45.0 46.0 47.5*  MCV 85.1  --  84.7  PLT 355  --  163   Basic Metabolic Panel: Recent Labs  Lab 07/26/20 1329 07/26/20 1335 07/27/20 0210 07/28/20 0125 07/30/20 0140  NA 137 135 137 139 142  K 4.0 4.0 4.2 3.7 3.8  CL 93*  --  94* 90* 90*  CO2 30  --  31 40* 46*  GLUCOSE 107*  --  129* 160* 98  BUN 44*  --  39* 51* 42*  CREATININE 2.90*  --  2.31* 2.26* 1.30*  CALCIUM 9.0  --  9.4 8.8* 9.0  MG  --   --   --  1.4*  --    GFR: Estimated Creatinine Clearance: 35 mL/min (A) (by C-G formula based on SCr of 1.3 mg/dL (H)). Liver Function Tests: Recent Labs  Lab 07/26/20 1329  AST 39  ALT 30  ALKPHOS 135*  BILITOT 2.1*  PROT 6.5  ALBUMIN 3.3*   No results for input(s): LIPASE, AMYLASE in the last 168 hours. No results for input(s): AMMONIA in the last 168 hours. Coagulation Profile: No results for input(s): INR, PROTIME in the last 168 hours.  Cardiac Enzymes: No results for input(s): CKTOTAL, CKMB, CKMBINDEX, TROPONINI in the last 168 hours. BNP (last 3 results) No results for input(s): PROBNP in the last 8760 hours. HbA1C: No results for input(s): HGBA1C in the last 72 hours. CBG: No results for input(s): GLUCAP in the last 168 hours. Lipid Profile: Recent Labs    07/28/20 0125  CHOL 129  HDL 28*  LDLCALC 79  TRIG 108  CHOLHDL 4.6   Thyroid Function Tests: Recent Labs    07/28/20 0125 07/28/20 1057  TSH 0.336*  --   FREET4  --  0.87  T3FREE  --  0.8*   Anemia Panel: No results for input(s): VITAMINB12, FOLATE, FERRITIN, TIBC, IRON, RETICCTPCT in the last 72 hours.    Radiology Studies: I have reviewed all of the imaging during this hospital visit personally     Scheduled Meds:  docusate sodium  100 mg Oral BID   DULoxetine  60 mg Oral BID   enoxaparin (LOVENOX) injection  30 mg Subcutaneous  Q24H   ipratropium-albuterol  3 mL Nebulization TID   levothyroxine  50 mcg Oral QAC breakfast   midodrine  5 mg Oral TID WC   mirtazapine  15 mg Oral QHS   mometasone-formoterol  2 puff Inhalation BID   multivitamin with minerals  1 tablet Oral Daily   nicotine  21 mg Transdermal Daily   pantoprazole  40 mg Oral BID AC   pregabalin  50 mg Oral TID   QUEtiapine  50-100 mg Oral QHS   sodium chloride flush  3 mL Intravenous Q12H   torsemide  20 mg Oral BID   Continuous Infusions:   LOS: 4 days        Phillips Climes, MD

## 2020-07-30 NOTE — Progress Notes (Signed)
RT note. Patient not requiring bipap at this time. Patient currently on 3L [place], no labored breathing noted. Will place patient on bipap if needed. RT will continue to monitor.

## 2020-07-31 DIAGNOSIS — E7800 Pure hypercholesterolemia, unspecified: Secondary | ICD-10-CM

## 2020-07-31 DIAGNOSIS — F17210 Nicotine dependence, cigarettes, uncomplicated: Secondary | ICD-10-CM

## 2020-07-31 DIAGNOSIS — I739 Peripheral vascular disease, unspecified: Secondary | ICD-10-CM

## 2020-07-31 LAB — BASIC METABOLIC PANEL
Anion gap: 10 (ref 5–15)
BUN: 46 mg/dL — ABNORMAL HIGH (ref 8–23)
CO2: 41 mmol/L — ABNORMAL HIGH (ref 22–32)
Calcium: 9 mg/dL (ref 8.9–10.3)
Chloride: 89 mmol/L — ABNORMAL LOW (ref 98–111)
Creatinine, Ser: 1.79 mg/dL — ABNORMAL HIGH (ref 0.44–1.00)
GFR, Estimated: 31 mL/min — ABNORMAL LOW (ref >60)
Glucose, Bld: 95 mg/dL (ref 70–99)
Potassium: 4.2 mmol/L (ref 3.5–5.1)
Sodium: 140 mmol/L (ref 135–145)

## 2020-07-31 LAB — CBC
HCT: 43.4 % (ref 36.0–46.0)
Hemoglobin: 11.9 g/dL — ABNORMAL LOW (ref 12.0–15.0)
MCH: 23.5 pg — ABNORMAL LOW (ref 26.0–34.0)
MCHC: 27.4 g/dL — ABNORMAL LOW (ref 30.0–36.0)
MCV: 85.6 fL (ref 80.0–100.0)
Platelets: 254 10*3/uL (ref 150–400)
RBC: 5.07 MIL/uL (ref 3.87–5.11)
RDW: 20.2 % — ABNORMAL HIGH (ref 11.5–15.5)
WBC: 10.4 10*3/uL (ref 4.0–10.5)
nRBC: 0 % (ref 0.0–0.2)

## 2020-07-31 MED ORDER — ASPIRIN EC 81 MG PO TBEC
81.0000 mg | DELAYED_RELEASE_TABLET | Freq: Every day | ORAL | Status: DC
  Administered 2020-07-31 – 2020-08-01 (×2): 81 mg via ORAL
  Filled 2020-07-31 (×2): qty 1

## 2020-07-31 NOTE — Progress Notes (Signed)
PROGRESS NOTE    [name]  VQM:[id number] DOB: 12-Jun-1955 DOA: 07/26/2020 PCP: [name], [name], MD    Brief Narrative:  65 year old female with history of COPD-with chronic hypoxic respiratory failure on 4 L of oxygen at home, hypothyroidism, HTN, HLD, uterine cancer, PAD, chronic pain syndrome-who presented with worsening dyspnea-she was found to have acute on chronic hypoxic respiratory failure due to COPD exacerbation and decompensated heart failure.  See below for further details.  Significant studies: 6/22>> COVID/influenza PCR: Negative 6/23>> Echo: EF 93-26%, grade 1 diastolic dysfunction.  Assessment & Plan: Acute on chronic hypoxic respiratory failure due to COPD exacerbation and decompensated combined systolic/diastolic heart failure: Clinically improved-no wheezing-volume status stable.  Completed prednisone-remains on bronchodilators.  Cardiology following-has been transitioned to [facility].  BP soft-on midodrine-hence not on beta-blocker or Entresto.  AKI: Overall improved-likely hemodynamically mediated-watch closely while on diuretics.  Hypothyroidism: Continue levothyroxine  Depression/anxiety: Stable-continue Cymbalta/Remeron/Xanax/Lyrica/quetiapine.  Tobacco abuse: Counseled-continue transdermal nicotine   Status is: Inpatient  Remains inpatient appropriate because:Inpatient level of care appropriate due to severity of illness  Dispo: The patient is from: Home              Anticipated d/c is to: Home              Patient currently is not medically stable to d/c.   Difficult to place patient No  DVT prophylaxis: Enoxaparin   Code Status:   full  Family Communication: Spouse at bedside    Nutrition Status: Nutrition Problem: Inadequate oral intake Etiology: poor appetite, social / environmental circumstances (depression) Signs/Symptoms: per patient/family report Interventions: Magic cup, MVI, Other (Comment) (Double portions at all  meals)    Consultants:  Cardiology    Subjective: Feels better-lying comfortably in bed.   Objective: Vitals:   07/31/20 0500 07/31/20 0741 07/31/20 1146 07/31/20 1351  BP:  (!) 103/44 101/60   Pulse:  79 85 84  Resp:  15 20   Temp:  97.7 F (36.5 C) (!) 97.4 F (36.3 C)   TempSrc:  Oral Oral   SpO2:  97% 99%   Weight: 54.4 kg     Height:        Intake/Output Summary (Last 24 hours) at 07/31/2020 1413 Last data filed at 07/30/2020 1919 Gross per 24 hour  Intake 360 ml  Output 1301 ml  Net -941 ml    Filed Weights   07/29/20 0500 07/30/20 0500 07/31/20 0500  Weight: 51.2 kg 53.7 kg 54.4 kg    Examination:  Gen Exam:Alert awake-not in any distress HEENT:atraumatic, normocephalic Chest: B/L clear to auscultation anteriorly CVS:S1S2 regular Abdomen:soft non tender, non distended Extremities:no edema Neurology: Non focal Skin: no rash    Data Reviewed: I have personally reviewed following labs and imaging studies  CBC: Recent Labs  Lab 07/26/20 1329 07/26/20 1335 07/27/20 0210 07/31/20 0207  WBC 10.7*  --  7.9 10.4  NEUTROABS 8.1*  --   --   --   HGB 12.6 15.6* 13.2 11.9*  HCT 45.0 46.0 47.5* 43.4  MCV 85.1  --  84.7 85.6  PLT 355  --  376 712    Basic Metabolic Panel: Recent Labs  Lab 07/26/20 1329 07/26/20 1335 07/27/20 0210 07/28/20 0125 07/30/20 0140 07/31/20 0207  NA 137 135 137 139 142 140  K 4.0 4.0 4.2 3.7 3.8 4.2  CL 93*  --  94* 90* 90* 89*  CO2 30  --  31 40* 46* 41*  GLUCOSE 107*  --  129* 160* 98 95  BUN 44*  --  39* 51* 42* 46*  CREATININE 2.90*  --  2.31* 2.26* 1.30* 1.79*  CALCIUM 9.0  --  9.4 8.8* 9.0 9.0  MG  --   --   --  1.4*  --   --     GFR: Estimated Creatinine Clearance: 25.4 mL/min (A) (by C-G formula based on SCr of 1.79 mg/dL (H)). Liver Function Tests: Recent Labs  Lab 07/26/20 1329  AST 39  ALT 30  ALKPHOS 135*  BILITOT 2.1*  PROT 6.5  ALBUMIN 3.3*    No results for input(s): LIPASE, AMYLASE in  the last 168 hours. No results for input(s): AMMONIA in the last 168 hours. Coagulation Profile: No results for input(s): INR, PROTIME in the last 168 hours. Cardiac Enzymes: No results for input(s): CKTOTAL, CKMB, CKMBINDEX, TROPONINI in the last 168 hours. BNP (last 3 results) No results for input(s): PROBNP in the last 8760 hours. HbA1C: No results for input(s): HGBA1C in the last 72 hours. CBG: No results for input(s): GLUCAP in the last 168 hours. Lipid Profile: No results for input(s): CHOL, HDL, LDLCALC, TRIG, CHOLHDL, LDLDIRECT in the last 72 hours.  Thyroid Function Tests: No results for input(s): TSH, T4TOTAL, FREET4, T3FREE, THYROIDAB in the last 72 hours.  Anemia Panel: No results for input(s): VITAMINB12, FOLATE, FERRITIN, TIBC, IRON, RETICCTPCT in the last 72 hours.    Radiology Studies: I have reviewed all of the imaging during this hospital visit personally     Scheduled Meds:  aspirin EC  81 mg Oral Daily   docusate sodium  100 mg Oral BID   DULoxetine  60 mg Oral BID   enoxaparin (LOVENOX) injection  30 mg Subcutaneous Q24H   ipratropium-albuterol  3 mL Nebulization TID   levothyroxine  50 mcg Oral QAC breakfast   midodrine  5 mg Oral TID WC   mirtazapine  15 mg Oral QHS   mometasone-formoterol  2 puff Inhalation BID   multivitamin with minerals  1 tablet Oral Daily   nicotine  21 mg Transdermal Daily   pantoprazole  40 mg Oral BID AC   pregabalin  50 mg Oral TID   QUEtiapine  50-100 mg Oral QHS   sodium chloride flush  3 mL Intravenous Q12H   torsemide  20 mg Oral BID   Continuous Infusions:   LOS: 5 days        Oren Binet, MD

## 2020-07-31 NOTE — Progress Notes (Signed)
Heart Failure Navigator Progress Note  Assessed for Heart & Vascular TOC clinic readiness.  At this time the patient does not meet criteria due to mixed COPD and CHF etiologies for acute hypoxic respiratory failure.   Navigator available for reassessment of patient.   Kerby Nora, PharmD, BCPS Heart Failure Stewardship Pharmacist Phone [phone]

## 2020-07-31 NOTE — Progress Notes (Signed)
OT Cancellation Note  Patient Details Name: [name] MRN: [id number] DOB: December 23, 1955   Cancelled Treatment:    Reason Eval/Treat Not Completed: Fatigue/lethargy limiting ability to participate;Other (comment) pt reports having just finished lunch, declining OT session at this time, will check back as time allows for OT session. Noted pt with [place] off with SpO2 reading in 80s, encouraged pt to apply [place] with sats returning to >90%.   Harley Alto., COTA/L Acute Rehabilitation Services [phone]   Precious Haws 07/31/2020, 11:45 AM

## 2020-07-31 NOTE — Progress Notes (Signed)
Progress Note  Patient Name: [name] Date of Encounter: 07/31/2020  [facility] Cardiologist: Rozann Lesches, MD   Subjective   Feeling well.  Breathing is improving each day.  She was able to lay flat in bed last night.  Inpatient Medications    Scheduled Meds:  docusate sodium  100 mg Oral BID   DULoxetine  60 mg Oral BID   enoxaparin (LOVENOX) injection  30 mg Subcutaneous Q24H   ipratropium-albuterol  3 mL Nebulization TID   levothyroxine  50 mcg Oral QAC breakfast   midodrine  5 mg Oral TID WC   mirtazapine  15 mg Oral QHS   mometasone-formoterol  2 puff Inhalation BID   multivitamin with minerals  1 tablet Oral Daily   nicotine  21 mg Transdermal Daily   pantoprazole  40 mg Oral BID AC   pregabalin  50 mg Oral TID   QUEtiapine  50-100 mg Oral QHS   sodium chloride flush  3 mL Intravenous Q12H   torsemide  20 mg Oral BID   Continuous Infusions:  PRN Meds: acetaminophen **OR** acetaminophen, albuterol, ALPRAZolam, bisacodyl, ondansetron **OR** ondansetron (ZOFRAN) IV, oxyCODONE, polyethylene glycol   Vital Signs    Vitals:   07/30/20 2338 07/31/20 0451 07/31/20 0500 07/31/20 0741  BP: 114/90 (!) 89/50  (!) 103/44  Pulse: 92 78  79  Resp: 18 16  15   Temp: 98.7 F (37.1 C) 97.9 F (36.6 C)  97.7 F (36.5 C)  TempSrc: Oral Oral  Oral  SpO2: 99% 100%  97%  Weight:   54.4 kg   Height:        Intake/Output Summary (Last 24 hours) at 07/31/2020 0909 Last data filed at 07/30/2020 1919 Gross per 24 hour  Intake 600 ml  Output 1801 ml  Net -1201 ml   Last 3 Weights 07/31/2020 07/30/2020 07/29/2020  Weight (lbs) 119 lb 14.9 oz 118 lb 6.2 oz 112 lb 14 oz  Weight (kg) 54.4 kg 53.7 kg 51.2 kg      Telemetry    Sinus rhythm.  Frequent PVCs.  10 beats of SVT- Personally Reviewed  ECG    N/a - Personally Reviewed  Physical Exam   VS:  BP (!) 103/44 (BP Location: Left Arm)   Pulse 79   Temp 97.7 F (36.5 C) (Oral)   Resp 15   Ht 5\' 2"   (1.575 m)   Wt 54.4 kg   SpO2 97%   BMI 21.94 kg/m  , BMI Body mass index is 21.94 kg/m. GENERAL: No acute distress HEENT: Pupils equal round and reactive, fundi not visualized, oral mucosa unremarkable NECK:  No jugular venous distention, waveform within normal limits, carotid upstroke brisk and symmetric, no bruits LUNGS: Diminished air movement.  No crackles or wheezes. HEART:  RRR.  PMI not displaced or sustained,S1 and S2 within normal limits, no S3, no S4, no clicks, no rubs, no murmurs ABD:  Flat, positive bowel sounds normal in frequency in pitch, no bruits, no rebound, no guarding, no midline pulsatile mass, no hepatomegaly, no splenomegaly EXT:  2 plus pulses throughout, no edema, no cyanosis no clubbing SKIN:  No rashes no nodules NEURO:  Cranial nerves II through XII grossly intact, motor grossly intact throughout PSYCH:  Cognitively intact, oriented to person place and time   Labs    High Sensitivity Troponin:   Recent Labs  Lab 07/26/20 1329 07/26/20 1546 07/27/20 1507  TROPONINIHS 78* 72* 52*      Chemistry  Recent Labs  Lab 07/26/20 1329 07/26/20 1335 07/28/20 0125 07/30/20 0140 07/31/20 0207  NA 137   < > 139 142 140  K 4.0   < > 3.7 3.8 4.2  CL 93*   < > 90* 90* 89*  CO2 30   < > 40* 46* 41*  GLUCOSE 107*   < > 160* 98 95  BUN 44*   < > 51* 42* 46*  CREATININE 2.90*   < > 2.26* 1.30* 1.79*  CALCIUM 9.0   < > 8.8* 9.0 9.0  PROT 6.5  --   --   --   --   ALBUMIN 3.3*  --   --   --   --   AST 39  --   --   --   --   ALT 30  --   --   --   --   ALKPHOS 135*  --   --   --   --   BILITOT 2.1*  --   --   --   --   GFRNONAA 18*   < > 24* 46* 31*  ANIONGAP 14   < > 9 6 10    < > = values in this interval not displayed.     Hematology Recent Labs  Lab 07/26/20 1329 07/26/20 1335 07/27/20 0210 07/31/20 0207  WBC 10.7*  --  7.9 10.4  RBC 5.29*  --  5.61* 5.07  HGB 12.6 15.6* 13.2 11.9*  HCT 45.0 46.0 47.5* 43.4  MCV 85.1  --  84.7 85.6  MCH 23.8*   --  23.5* 23.5*  MCHC 28.0*  --  27.8* 27.4*  RDW 20.9*  --  20.8* 20.2*  PLT 355  --  376 254    BNP Recent Labs  Lab 07/26/20 1329  BNP 3,606.8*     DDimer  Recent Labs  Lab 07/26/20 1329  DDIMER 2.00*     Radiology    No results found.  Cardiac Studies   Echo 07/27/2020:  1. Left ventricular ejection fraction, by estimation, is 30 to 35%. The  left ventricle has moderately decreased function. The left ventricle  demonstrates global hypokinesis. The left ventricular internal cavity size  was mildly dilated. There is moderate   left ventricular hypertrophy. Left ventricular diastolic parameters are  consistent with Grade I diastolic dysfunction (impaired relaxation).  Elevated left atrial pressure.   2. Right ventricular systolic function is normal. The right ventricular  size is normal.   3. Left atrial size was mildly dilated.   4. The mitral valve is normal in structure. No evidence of mitral valve  regurgitation. No evidence of mitral stenosis.   5. The aortic valve has an indeterminant number of cusps. Aortic valve  regurgitation is not visualized. No aortic stenosis is present.   6. The inferior vena cava is normal in size with greater than 50%  respiratory variability, suggesting right atrial pressure of 3 mmHg.   Echo from 08/25/2018:    1. The left ventricle has low normal systolic function, with an ejection  fraction of 50-55%. The cavity size was normal. There is moderately  increased left ventricular wall thickness. Left ventricular diastolic  Doppler parameters are consistent with  impaired relaxation. Elevated mean left atrial pressure.   2. The right ventricle has normal systolic function. The cavity was  normal. There is no increase in right ventricular wall thickness.   3. Left atrial size was mildly dilated.   4. Right atrial  size was mildly dilated.   5. No evidence of mitral valve stenosis.   6. No stenosis of the aortic valve.   7. The  aortic root is normal in size and structure.   8. Pulmonary hypertension is indeterminant, inadequate TR jet.    Left cardiac cath 07/12/2013:   Left mainstem: There was no left main.  The LAD and LCx have separate ostia.   Left anterior descending (LAD): Separate ostium.  No angiographic CAD.   Left circumflex (LCx): Separate ostium.  No angiographic CAD.   Right coronary artery (RCA): No angiographic CAD.   Left ventriculography: Left ventricular systolic function is normal, LVEF is estimated at 60-65%, there is no significant mitral regurgitation   Final Conclusions:  No angiographic CAD, normal LV systolic function.  Suspect false positive Cardiolite.  Followup with Dr. Jacinta Shoe.    Patient Profile     65 y.o. female with COPD on 3 L home O2, tobacco abuse, chronic diastolic heart failure, orthostasis and hypotension on midodrine, hyperlipidemia, depression, carotid stenosis, PAD, hypothyroidism, subclavian stenosis, and chronic pain syndrome admitted with acute systolic diastolic heart failure and COPD exacerbation.  Assessment & Plan    #Acute systolic diastolic heart failure: Echo this admission revealed LVEF 30 to 35%, down from 55 to 55% in 2020.  Agree with Dr. Margaretann Loveless, on my review of her prior echo it seems that her systolic function was reduced at that time as well.  She had a previous ischemia evaluation in 2015 that showed no CAD.  No plans for repeat ischemic evaluation at this time unless she develops symptoms consistent with angina.  She does not have much blood pressure room for guideline directed medical therapy.  She appears to be euvolemic today.  Agree with switching to oral torsemide.  Creatinine is slightly higher today.  If her renal function remained stable and she has negative fluid balance, would be okay with discharge tomorrow.  Would recommend starting an SGLT2 inhibitor.  However would not do this in the setting of worsening renal function and  diuresis.  #COPD: #Tobacco abuse: No wheezing on exam.  Management per primary team.  Encouraged smoking cessation.  # Carotid stenosis:  # Hyperlipidemia:  # PAD:  LDL 79 this admission. Resume EC aspirin.  Consider low dose statin as an outpatient.  She had myalgias with atorvastatin.  # Orthostatic hypotension:  Continue midodrine.       For questions or updates, please contact [place] Please consult [email or web address] for contact info under        Signed, Skeet Latch, MD  07/31/2020, 9:09 AM

## 2020-07-31 NOTE — Progress Notes (Signed)
Physical Therapy Treatment Patient Details Name: [name] MRN: [id number] DOB: [birth year] Today's Date: 07/31/2020    History of Present Illness 65 y.o. F admitted to [facility] due to hypoxia from noncompliant O2 wearing, resulting in hypercapnic respiratory failure. PMH significant for 3L O2-dependent COPD, HTN, HLD, chronic pain under pain management, hypothyroidism, PVD and uterine cancer.    PT Comments    Pt admitted with above diagnosis. Pt was able to ambulate without physical assist with overall steady gait.  Pt not challenged but she was pushing the O2 tank.  Pt reluctant at first to get OOB but with encouragement did get pt to walk on unit. Pt needed 4L O2 to maintain sats >90% with activity. Will continue to follow acutely and progress as able.  Pt currently with functional limitations due to balance and endruance deficits. Pt will benefit from skilled PT to increase their independence and safety with mobility to allow discharge to the venue listed below.      Follow Up Recommendations  Home health PT     Equipment Recommendations  None recommended by PT    Recommendations for Other Services       Precautions / Restrictions Precautions Precautions: Fall Restrictions Weight Bearing Restrictions: No    Mobility  Bed Mobility Overal bed mobility: Modified Independent                  Transfers Overall transfer level: Needs assistance Equipment used: None Transfers: Sit to/from Stand;Stand Pivot Transfers Sit to Stand: Min guard Stand pivot transfers: Min guard       General transfer comment: STeady on feet upon standing, put housecoat on without Assist  Ambulation/Gait Ambulation/Gait assistance: Min assist;Min guard Gait Distance (Feet): 280 Feet Assistive device: None Gait Pattern/deviations: Step-through pattern;Decreased stride length Gait velocity: decreased Gait velocity interpretation: 1.31 - 2.62 ft/sec, indicative of limited community  ambulator General Gait Details: Pt managed portable O2 per her preference as therapist provided min guard assist via gait belt. Mobilized on 4L with SpO2 > 90%. No SOB noted. Pt was 3L on arrival but did need 4L with activity. Fatigued upon return to room.   Stairs             Wheelchair Mobility    Modified Rankin (Stroke Patients Only)       Balance Overall balance assessment: Needs assistance Sitting-balance support: Feet supported;No upper extremity supported Sitting balance-Leahy Scale: Good     Standing balance support: No upper extremity supported;During functional activity Standing balance-Leahy Scale: Poor Standing balance comment: reliant on external support                            Cognition Arousal/Alertness: Awake/alert Behavior During Therapy: Flat affect Overall Cognitive Status: Within Functional Limits for tasks assessed                                        Exercises      General Comments        Pertinent Vitals/Pain Pain Assessment: No/denies pain    Home Living                      Prior Function            PT Goals (current goals can now be found in the care plan section) Acute Rehab PT Goals  Patient Stated Goal: feel better Progress towards PT goals: Progressing toward goals    Frequency    Min 3X/week      PT Plan Current plan remains appropriate    Co-evaluation              AM-PAC PT "6 Clicks" Mobility   Outcome Measure  Help needed turning from your back to your side while in a flat bed without using bedrails?: None Help needed moving from lying on your back to sitting on the side of a flat bed without using bedrails?: None Help needed moving to and from a bed to a chair (including a wheelchair)?: A Little Help needed standing up from a chair using your arms (e.g., wheelchair or bedside chair)?: A Little Help needed to walk in hospital room?: A Little Help needed  climbing 3-5 steps with a railing? : A Little 6 Click Score: 20    End of Session Equipment Utilized During Treatment: Gait belt;Oxygen Activity Tolerance: Patient tolerated treatment well Patient left: with call bell/phone within reach;in bed;with bed alarm set Nurse Communication: Mobility status PT Visit Diagnosis: Unsteadiness on feet (R26.81)     Time: 2536-6440 PT Time Calculation (min) (ACUTE ONLY): 11 min  Charges:  $Gait Training: 8-22 mins                     [name],PT Acute Rehab Services [phone] (pager)    [name] 07/31/2020, 4:10 PM

## 2020-08-01 DIAGNOSIS — G894 Chronic pain syndrome: Secondary | ICD-10-CM

## 2020-08-01 LAB — BASIC METABOLIC PANEL
Anion gap: 14 (ref 5–15)
BUN: 47 mg/dL — ABNORMAL HIGH (ref 8–23)
CO2: 45 mmol/L — ABNORMAL HIGH (ref 22–32)
Calcium: 9.3 mg/dL (ref 8.9–10.3)
Chloride: 82 mmol/L — ABNORMAL LOW (ref 98–111)
Creatinine, Ser: 1.38 mg/dL — ABNORMAL HIGH (ref 0.44–1.00)
GFR, Estimated: 43 mL/min — ABNORMAL LOW (ref >60)
Glucose, Bld: 80 mg/dL (ref 70–99)
Potassium: 3.7 mmol/L (ref 3.5–5.1)
Sodium: 141 mmol/L (ref 135–145)

## 2020-08-01 MED ORDER — NICOTINE 21 MG/24HR TD PT24
21.0000 mg | MEDICATED_PATCH | Freq: Every day | TRANSDERMAL | 0 refills | Status: DC
Start: 2020-08-01 — End: 2020-09-12

## 2020-08-01 MED ORDER — EMPAGLIFLOZIN 10 MG PO TABS
10.0000 mg | ORAL_TABLET | Freq: Every day | ORAL | Status: DC
  Administered 2020-08-01: 10 mg via ORAL
  Filled 2020-08-01: qty 1

## 2020-08-01 MED ORDER — ENOXAPARIN SODIUM 40 MG/0.4ML IJ SOSY: 40.0000 mg | PREFILLED_SYRINGE | INTRAMUSCULAR | Status: DC

## 2020-08-01 MED ORDER — MOMETASONE FURO-FORMOTEROL FUM 200-5 MCG/ACT IN AERO
2.0000 | INHALATION_SPRAY | Freq: Two times a day (BID) | RESPIRATORY_TRACT | 0 refills | Status: DC
Start: 2020-08-01 — End: 2020-09-12

## 2020-08-01 MED ORDER — EMPAGLIFLOZIN 10 MG PO TABS: 10.0000 mg | ORAL_TABLET | Freq: Every day | ORAL | 0 refills | Status: DC

## 2020-08-01 MED ORDER — TORSEMIDE 20 MG PO TABS: 20.0000 mg | ORAL_TABLET | Freq: Two times a day (BID) | ORAL | 0 refills | Status: DC

## 2020-08-01 NOTE — Progress Notes (Signed)
Progress Note  Patient Name: [name] Date of Encounter: 08/01/2020  [facility] Cardiologist: Rozann Lesches, MD   Subjective   Feeling well.  Her breathing is nearly at baseline.  She ambulated without difficulty.  She reports a lot of urine output yesterday that was not recorded.  Inpatient Medications    Scheduled Meds:  aspirin EC  81 mg Oral Daily   docusate sodium  100 mg Oral BID   DULoxetine  60 mg Oral BID   enoxaparin (LOVENOX) injection  30 mg Subcutaneous Q24H   ipratropium-albuterol  3 mL Nebulization TID   levothyroxine  50 mcg Oral QAC breakfast   midodrine  5 mg Oral TID WC   mirtazapine  15 mg Oral QHS   mometasone-formoterol  2 puff Inhalation BID   multivitamin with minerals  1 tablet Oral Daily   nicotine  21 mg Transdermal Daily   pantoprazole  40 mg Oral BID AC   pregabalin  50 mg Oral TID   QUEtiapine  50-100 mg Oral QHS   sodium chloride flush  3 mL Intravenous Q12H   torsemide  20 mg Oral BID   Continuous Infusions:  PRN Meds: acetaminophen **OR** acetaminophen, albuterol, ALPRAZolam, bisacodyl, ondansetron **OR** ondansetron (ZOFRAN) IV, oxyCODONE, polyethylene glycol   Vital Signs    Vitals:   07/31/20 2300 08/01/20 0415 08/01/20 0500 08/01/20 0729  BP: 101/67 108/62  91/64  Pulse: 87 90  78  Resp: 20 20  16   Temp: 98 F (36.7 C) 97.9 F (36.6 C)  98 F (36.7 C)  TempSrc: Oral Oral  Oral  SpO2: 98% 98%  100%  Weight:   53.7 kg   Height:       No intake or output data in the 24 hours ending 08/01/20 0856  Last 3 Weights 08/01/2020 07/31/2020 07/30/2020  Weight (lbs) 118 lb 6.2 oz 119 lb 14.9 oz 118 lb 6.2 oz  Weight (kg) 53.7 kg 54.4 kg 53.7 kg      Telemetry    Sinus rhythm.  PVCs.- Personally Reviewed  ECG    N/a - Personally Reviewed  Physical Exam   VS:  BP 91/64 (BP Location: Left Arm)   Pulse 78   Temp 98 F (36.7 C) (Oral)   Resp 16   Ht 5\' 2"  (1.575 m)   Wt 53.7 kg   SpO2 100%   BMI 21.65  kg/m  , BMI Body mass index is 21.65 kg/m. GENERAL: No acute distress HEENT: Pupils equal round and reactive, fundi not visualized, oral mucosa unremarkable NECK:  No jugular venous distention, waveform within normal limits, carotid upstroke brisk and symmetric, no bruits LUNGS: Diminished air movement.  No crackles or wheezes. HEART:  RRR.  PMI not displaced or sustained,S1 and S2 within normal limits, no S3, no S4, no clicks, no rubs, no murmurs ABD:  Flat, positive bowel sounds normal in frequency in pitch, no bruits, no rebound, no guarding, no midline pulsatile mass, no hepatomegaly, no splenomegaly EXT:  2 plus pulses throughout, no edema, no cyanosis no clubbing SKIN:  No rashes no nodules NEURO:  Cranial nerves II through XII grossly intact, motor grossly intact throughout PSYCH:  Cognitively intact, oriented to person place and time   Labs    High Sensitivity Troponin:   Recent Labs  Lab 07/26/20 1329 07/26/20 1546 07/27/20 1507  TROPONINIHS 78* 72* 52*      Chemistry Recent Labs  Lab 07/26/20 1329 07/26/20 1335 07/30/20 0140 07/31/20 0207  08/01/20 0726  NA 137   < > 142 140 141  K 4.0   < > 3.8 4.2 3.7  CL 93*   < > 90* 89* 82*  CO2 30   < > 46* 41* 45*  GLUCOSE 107*   < > 98 95 80  BUN 44*   < > 42* 46* 47*  CREATININE 2.90*   < > 1.30* 1.79* 1.38*  CALCIUM 9.0   < > 9.0 9.0 9.3  PROT 6.5  --   --   --   --   ALBUMIN 3.3*  --   --   --   --   AST 39  --   --   --   --   ALT 30  --   --   --   --   ALKPHOS 135*  --   --   --   --   BILITOT 2.1*  --   --   --   --   GFRNONAA 18*   < > 46* 31* 43*  ANIONGAP 14   < > 6 10 14    < > = values in this interval not displayed.     Hematology Recent Labs  Lab 07/26/20 1329 07/26/20 1335 07/27/20 0210 07/31/20 0207  WBC 10.7*  --  7.9 10.4  RBC 5.29*  --  5.61* 5.07  HGB 12.6 15.6* 13.2 11.9*  HCT 45.0 46.0 47.5* 43.4  MCV 85.1  --  84.7 85.6  MCH 23.8*  --  23.5* 23.5*  MCHC 28.0*  --  27.8* 27.4*   RDW 20.9*  --  20.8* 20.2*  PLT 355  --  376 254    BNP Recent Labs  Lab 07/26/20 1329  BNP 3,606.8*     DDimer  Recent Labs  Lab 07/26/20 1329  DDIMER 2.00*     Radiology    No results found.  Cardiac Studies   Echo 07/27/2020:  1. Left ventricular ejection fraction, by estimation, is 30 to 35%. The  left ventricle has moderately decreased function. The left ventricle  demonstrates global hypokinesis. The left ventricular internal cavity size  was mildly dilated. There is moderate   left ventricular hypertrophy. Left ventricular diastolic parameters are  consistent with Grade I diastolic dysfunction (impaired relaxation).  Elevated left atrial pressure.   2. Right ventricular systolic function is normal. The right ventricular  size is normal.   3. Left atrial size was mildly dilated.   4. The mitral valve is normal in structure. No evidence of mitral valve  regurgitation. No evidence of mitral stenosis.   5. The aortic valve has an indeterminant number of cusps. Aortic valve  regurgitation is not visualized. No aortic stenosis is present.   6. The inferior vena cava is normal in size with greater than 50%  respiratory variability, suggesting right atrial pressure of 3 mmHg.   Echo from 08/25/2018:    1. The left ventricle has low normal systolic function, with an ejection  fraction of 50-55%. The cavity size was normal. There is moderately  increased left ventricular wall thickness. Left ventricular diastolic  Doppler parameters are consistent with  impaired relaxation. Elevated mean left atrial pressure.   2. The right ventricle has normal systolic function. The cavity was  normal. There is no increase in right ventricular wall thickness.   3. Left atrial size was mildly dilated.   4. Right atrial size was mildly dilated.   5. No evidence of mitral valve  stenosis.   6. No stenosis of the aortic valve.   7. The aortic root is normal in size and structure.   8.  Pulmonary hypertension is indeterminant, inadequate TR jet.    Left cardiac cath 07/12/2013:   Left mainstem: There was no left main.  The LAD and LCx have separate ostia.   Left anterior descending (LAD): Separate ostium.  No angiographic CAD.   Left circumflex (LCx): Separate ostium.  No angiographic CAD.   Right coronary artery (RCA): No angiographic CAD.   Left ventriculography: Left ventricular systolic function is normal, LVEF is estimated at 60-65%, there is no significant mitral regurgitation   Final Conclusions:  No angiographic CAD, normal LV systolic function.  Suspect false positive Cardiolite.  Followup with Dr. Jacinta Shoe.    Patient Profile     65 y.o. female with COPD on 3 L home O2, tobacco abuse, chronic diastolic heart failure, orthostasis and hypotension on midodrine, hyperlipidemia, depression, carotid stenosis, PAD, hypothyroidism, subclavian stenosis, and chronic pain syndrome admitted with acute systolic diastolic heart failure and COPD exacerbation.  Assessment & Plan    #Acute systolic diastolic heart failure: Echo this admission revealed LVEF 30 to 35%, down from 55 to 55% in 2020.  Agree with Dr. Margaretann Loveless, on my review of her prior echo it seems that her systolic function was reduced at that time as well.  She had a previous ischemia evaluation in 2015 that showed no CAD.  No plans for repeat ischemic evaluation at this time unless she develops symptoms consistent with angina.  She does not have much blood pressure room for guideline directed medical therapy.  She is euvolemic.  She was switched to oral torsemide yesterday.  Urine output was not recorded but her weight decreased from 54.4 kg to 53.7 kg today.  Renal function is improving.  Given her reduced systolic function we will add Jardiance today.  #COPD: #Tobacco abuse: No wheezing on exam.  Management per primary team.  Encouraged smoking cessation.  # Carotid stenosis:  # Hyperlipidemia:  # PAD:  LDL  79 this admission. Resume EC aspirin.  Consider low dose statin as an outpatient.  She had myalgias with atorvastatin.  # Orthostatic hypotension:  Continue midodrine.  [facility] will sign off.   Medication Recommendations:  Add Jardiance Other recommendations (labs, testing, etc):  none Follow up as an outpatient: we will arrange      For questions or updates, please contact [place] Please consult [email or web address] for contact info under        Signed, Skeet Latch, MD  08/01/2020, 8:56 AM

## 2020-08-01 NOTE — Discharge Summary (Signed)
PATIENT DETAILS Name: [name] Age: 65 y.o. Sex: female Date of Birth: January 19, 1956 MRN: [id number]. Admitting Physician: Karmen Bongo, MD VEL:[id number] Everardo Beals, MD  Admit Date: 07/26/2020 Discharge date: 08/01/2020  Recommendations for Outpatient Follow-up:  Follow up with PCP in 1-2 weeks Please obtain CMP/CBC in one week Please ensure follow-up with cardiology,  Admitted From:  Home  Disposition: Home with home health services   Home Health: Yes  Equipment/Devices: None  Discharge Condition: Stable  CODE STATUS: FULL CODE  Diet recommendation:  Diet Order             Diet - low sodium heart healthy           Diet regular Room service appropriate? Yes; Fluid consistency: Thin  Diet effective ____                    Brief Summary: 65 year old female with history of COPD-with chronic hypoxic respiratory failure on 4 L of oxygen at home, hypothyroidism, HTN, HLD, uterine cancer, PAD, chronic pain syndrome-who presented with worsening dyspnea-she was found to have acute on chronic hypoxic respiratory failure due to COPD exacerbation and decompensated heart failure.  See below for further details.  Significant studies: 6/22>> COVID/influenza PCR: Negative 6/23>> Echo: EF 25-85%, grade 1 diastolic dysfunction  Brief Hospital Course: Acute on chronic hypoxic respiratory failure due to COPD exacerbation and decompensated combined systolic/diastolic heart failure: Clinically improved-no wheezing-volume status stable.  Completed prednisone-remains on bronchodilators.  Cardiology following-has been transitioned to [facility].  BP soft-on midodrine-hence not on beta-blocker or Entresto.  Cardiology recommending adding Jardiance on discharge.  Patient instructed to follow-up with cardiology and pulmonology for post hospital discharge visit.  She was counseled regarding importance of continuing home O2 use 24/7.   AKI: Overall improved-likely  hemodynamically mediated-has improved-continue to watch closely in the outpatient setting.   Hypothyroidism: Continue levothyroxine   Depression/anxiety: Stable-continue Cymbalta/Remeron/Xanax/quetiapine.   Tobacco abuse: Counseled-continue transdermal nicotine.  Chronic pain syndrome: Continue narcotics/Lyrica/Cymbalta as usual-follow-up with PCP.  PAD: Continue aspirin-cardiology will consider low-dose statin in the outpatient setting-she has had myalgia in the past with statins.  History of orthostatic hypotension: Continue midodrine on discharge.   Procedures None  Discharge Diagnoses:  Principal Problem:   Acute on chronic respiratory failure with hypoxia and hypercapnia (HCC) Active Problems:   Hypothyroid   Cigarette smoker   Anxiety and depression   Chronic pain   Goals of care, counseling/discussion   Acute kidney injury ([place])   COPD  GOLD II / still smoking    Idiopathic hypotension   Acute on chronic combined systolic and diastolic CHF (congestive heart failure) ([facility])   Palliative care by specialist   Discharge Instructions:  Activity:  As tolerated with Full fall precautions use walker/cane & assistance as needed   Discharge Instructions     (HEART FAILURE PATIENTS) Call MD:  Anytime you have any of the following symptoms: 1) 3 pound weight gain in 24 hours or 5 pounds in 1 week 2) shortness of breath, with or without a dry hacking cough 3) swelling in the hands, feet or stomach 4) if you have to sleep on extra pillows at night in order to breathe.   Complete by: As directed    Amb Referral to HF Clinic   Complete by: As directed    Call MD for:  difficulty breathing, headache or visual disturbances   Complete by: As directed    Diet - low sodium heart healthy  Complete by: As directed    Discharge instructions   Complete by: As directed    Follow with Primary MD  [name], [name], MD in 1-2 weeks  Follow-up with pulmonologist-Dr. Melvyn Novas in  the next 1-2 weeks.  Follow-up with cardiologist as instructed.  Stop tobacco use.  Use home O2 24/7  Please get a complete blood count and chemistry panel checked by your Primary MD at your next visit, and again as instructed by your Primary MD.  Get Medicines reviewed and adjusted: Please take all your medications with you for your next visit with your Primary MD  Laboratory/radiological data: Please request your Primary MD to go over all hospital tests and procedure/radiological results at the follow up, please ask your Primary MD to get all Hospital records sent to his/her office.  In some cases, they will be blood work, cultures and biopsy results pending at the time of your discharge. Please request that your primary care M.D. follows up on these results.  Also Note the following: If you experience worsening of your admission symptoms, develop shortness of breath, life threatening emergency, suicidal or homicidal thoughts you must seek medical attention immediately by calling 911 or calling your MD immediately  if symptoms less severe.  You must read complete instructions/literature along with all the possible adverse reactions/side effects for all the Medicines you take and that have been prescribed to you. Take any new Medicines after you have completely understood and accpet all the possible adverse reactions/side effects.   Do not drive when taking Pain medications or sleeping medications (Benzodaizepines)  Do not take more than prescribed Pain, Sleep and Anxiety Medications. It is not advisable to combine anxiety,sleep and pain medications without talking with your primary care practitioner  Special Instructions: If you have smoked or chewed Tobacco  in the last 2 yrs please stop smoking, stop any regular Alcohol  and or any Recreational drug use.  Wear Seat belts while driving.  Please note: You were cared for by a hospitalist during your hospital stay. Once you are  discharged, your primary care physician will handle any further medical issues. Please note that NO REFILLS for any discharge medications will be authorized once you are discharged, as it is imperative that you return to your primary care physician (or establish a relationship with a primary care physician if you do not have one) for your post hospital discharge needs so that they can reassess your need for medications and monitor your lab values.   Increase activity slowly   Complete by: As directed       Allergies as of 08/01/2020       Reactions   Mirtazapine Other (See Comments)   halucinations   Acetaminophen-codeine Nausea And Vomiting   Aspirin Nausea And Vomiting   (can take coated aspirin)   Astelin [azelastine]    Bactrim [sulfamethoxazole-trimethoprim]    Celecoxib Other (See Comments)   "does the opposite of what it is suppose to do"   Ciprofloxacin    Fentanyl    Levaquin [levofloxacin In D5w] Itching   Lipitor [atorvastatin] Other (See Comments)   Myalgias    Neomycin    Salicylates    Sulfa Antibiotics Nausea And Vomiting   Adhesive [tape] Rash   Latex Rash   Tegretol [carbamazepine] Rash        Medication List     TAKE these medications    albuterol (2.5 MG/3ML) 0.083% nebulizer solution Commonly known as: PROVENTIL Take 3 mLs (2.5  mg total) by nebulization every 4 (four) hours as needed for wheezing or shortness of breath (cough).   albuterol 108 (90 Base) MCG/ACT inhaler Commonly known as: ProAir HFA Inhale 2 puffs into the lungs every 4 (four) hours as needed for wheezing or shortness of breath (cough).   ALPRAZolam 0.5 MG tablet Commonly known as: XANAX Take 1 tablet (0.5 mg total) by mouth 2 (two) times daily as needed. for anxiety   aspirin EC 81 MG tablet Take 81 mg by mouth daily.   DULoxetine 60 MG capsule Commonly known as: CYMBALTA Take 1 capsule (60 mg total) by mouth 2 (two) times daily.   empagliflozin 10 MG Tabs tablet Commonly  known as: JARDIANCE Take 1 tablet (10 mg total) by mouth daily.   fluticasone 50 MCG/ACT nasal spray Commonly known as: FLONASE Place 1-2 sprays into both nostrils daily as needed for allergies.   levothyroxine 50 MCG tablet Commonly known as: Synthroid Take 1 tablet (50 mcg total) by mouth daily before breakfast.   midodrine 5 MG tablet Commonly known as: PROAMATINE Take 1 tablet (5 mg total) by mouth 3 (three) times daily with meals.   mirtazapine 15 MG tablet Commonly known as: REMERON TAKE (1) TABLET BY MOUTH AT BEDTIME. What changed: See the new instructions.   mometasone-formoterol 200-5 MCG/ACT Aero Commonly known as: DULERA Inhale 2 puffs into the lungs 2 (two) times daily.   nicotine 21 mg/24hr patch Commonly known as: NICODERM CQ - dosed in mg/24 hours Place 1 patch (21 mg total) onto the skin daily.   OVER THE COUNTER MEDICATION Take 1 capsule by mouth daily. Amberen Multi-Symptom Menopause Relief   oxyCODONE 5 MG immediate release tablet Commonly known as: Oxy IR/ROXICODONE TAKE (1) TABLET BY MOUTH EVERY SIX HOURS AS NEEDED FOR SEVERE PAIN. What changed:  how much to take how to take this when to take this reasons to take this additional instructions   pantoprazole 40 MG tablet Commonly known as: PROTONIX TAKE ONE TABLET BY MOUTH TWICE DAILY BEFORE A MEAL. What changed: See the new instructions.   pregabalin 50 MG capsule Commonly known as: LYRICA Take 1 capsule (50 mg total) by mouth 3 (three) times daily. Actually 1 in Am and 2 at night- for nerve pain What changed:  how much to take when to take this additional instructions   QUEtiapine 50 MG tablet Commonly known as: SEROquel Take 1-2 tablets (50-100 mg total) by mouth at bedtime.   torsemide 20 MG tablet Commonly known as: DEMADEX Take 1 tablet (20 mg total) by mouth 2 (two) times daily.   UNABLE TO FIND Med Name: OTC Amberen (for hot flashes)        Follow-up Information      [facility] Follow up.   Specialty: Home Health Services Why: For home health services. They will contact you in 1-2 days to set up your first appointment Contact information: [place] [address] [phone]         [name], [name], MD. Schedule an appointment as soon as possible for a visit in 1 week(s).   Specialty: Internal Medicine Contact information: [place] [address] [phone]         Satira Sark, MD Follow up.   Specialty: Cardiology Why: Office will call with date/time, If you dont hear from them,please give them a call Contact information: [place] [address] [phone]  Tanda Rockers, MD. Schedule an appointment as soon as possible for a visit in 1 week(s).   Specialty: Pulmonary Disease Contact information: [address] [place] [address] [phone]                Allergies  Allergen Reactions   Mirtazapine Other (See Comments)    halucinations   Acetaminophen-Codeine Nausea And Vomiting   Aspirin Nausea And Vomiting    (can take coated aspirin)   Astelin [Azelastine]    Bactrim [Sulfamethoxazole-Trimethoprim]    Celecoxib Other (See Comments)    "does the opposite of what it is suppose to do"   Ciprofloxacin    Fentanyl    Levaquin [Levofloxacin In D5w] Itching   Lipitor [Atorvastatin] Other (See Comments)    Myalgias    Neomycin    Salicylates    Sulfa Antibiotics Nausea And Vomiting   Adhesive [Tape] Rash   Latex Rash   Tegretol [Carbamazepine] Rash    Consultations:  cardiology   Other Procedures/Studies: NM Pulmonary Perf and Vent  Result Date: 07/27/2020 CLINICAL DATA:  Positive D-dimer and shortness of breath, initial encounter EXAM: NUCLEAR MEDICINE PERFUSION LUNG SCAN TECHNIQUE: Perfusion images were obtained in multiple projections after intravenous injection of radiopharmaceutical.  Ventilation scans intentionally deferred if perfusion scan and chest x-ray adequate for interpretation during COVID 19 epidemic. RADIOPHARMACEUTICALS:  4.15 mCi Tc-49m MAA IV COMPARISON:  07/26/2020 FINDINGS: Decreased perfusion is noted secondary to emphysematous changes in the right upper lobe. Adequate uptake is noted throughout both lungs without wedge-shaped defect to suggest pulmonary embolism. IMPRESSION: No evidence of pulmonary embolism. Changes consistent with COPD with decreased perfusion in the right upper lobe. Electronically Signed   By: Inez Catalina M.D.   On: 07/27/2020 17:29   DG CHEST PORT 1 VIEW  Result Date: 07/27/2020 CLINICAL DATA:  65 year old female with acute respiratory failure. EXAM: PORTABLE CHEST 1 VIEW COMPARISON:  Chest radiograph dated 07/27/2020. FINDINGS: Background of chronic interstitial coarsening. No focal consolidation, pleural effusion pneumothorax. Stable cardiomegaly. Atherosclerotic calcification of the aorta. No osseous pathology. Old left rib fractures. IMPRESSION: No active disease. Electronically Signed   By: Anner Crete M.D.   On: 07/27/2020 19:46   DG Chest Port 1 View  Result Date: 07/26/2020 CLINICAL DATA:  Dyspnea EXAM: PORTABLE CHEST 1 VIEW COMPARISON:  06/11/2019 FINDINGS: Cardiomegaly. Scarring and or atelectasis at the bilateral lung bases. Emphysema. The visualized skeletal structures are unremarkable. IMPRESSION: 1.  Cardiomegaly. 2. Scarring and or atelectasis at the bilateral lung bases without acute appearing airspace opacity. 3.  Emphysema. Electronically Signed   By: Eddie Candle M.D.   On: 07/26/2020 13:48   ECHOCARDIOGRAM COMPLETE  Result Date: 07/27/2020    ECHOCARDIOGRAM REPORT   Patient Name:   [name] [facility] Date of Exam: 07/27/2020 Medical Rec #:  [id number]          Height:       62.0 in Accession #:    [id number]         Weight:       117.4 lb Date of Birth:  Jun 18, 1955           BSA:          1.524 m Patient Age:    45 years            BP:           105/53 mmHg Patient Gender: F  HR:           80 bpm. Exam Location:  Inpatient Procedure: 2D Echo, Cardiac Doppler and Color Doppler Indications:    CHF  History:        Patient has prior history of Echocardiogram examinations, most                 recent 08/25/2018. COPD; Risk Factors:Hypertension, Dyslipidemia                 and Current Smoker.  Sonographer:    Cammy Brochure Referring Phys: Gay  1. Left ventricular ejection fraction, by estimation, is 30 to 35%. The left ventricle has moderately decreased function. The left ventricle demonstrates global hypokinesis. The left ventricular internal cavity size was mildly dilated. There is moderate  left ventricular hypertrophy. Left ventricular diastolic parameters are consistent with Grade I diastolic dysfunction (impaired relaxation). Elevated left atrial pressure.  2. Right ventricular systolic function is normal. The right ventricular size is normal.  3. Left atrial size was mildly dilated.  4. The mitral valve is normal in structure. No evidence of mitral valve regurgitation. No evidence of mitral stenosis.  5. The aortic valve has an indeterminant number of cusps. Aortic valve regurgitation is not visualized. No aortic stenosis is present.  6. The inferior vena cava is normal in size with greater than 50% respiratory variability, suggesting right atrial pressure of 3 mmHg. FINDINGS  Left Ventricle: Left ventricular ejection fraction, by estimation, is 30 to 35%. The left ventricle has moderately decreased function. The left ventricle demonstrates global hypokinesis. The left ventricular internal cavity size was mildly dilated. There is moderate left ventricular hypertrophy. Abnormal (paradoxical) septal motion, consistent with left bundle branch block. Left ventricular diastolic parameters are consistent with Grade I diastolic dysfunction (impaired relaxation). Elevated left atrial pressure.  Right Ventricle: The right ventricular size is normal. Right ventricular systolic function is normal. Left Atrium: Left atrial size was mildly dilated. Right Atrium: Right atrial size was normal in size. Pericardium: There is no evidence of pericardial effusion. Mitral Valve: The mitral valve is normal in structure. No evidence of mitral valve regurgitation. No evidence of mitral valve stenosis. MV peak gradient, 13.8 mmHg. The mean mitral valve gradient is 5.0 mmHg. Tricuspid Valve: The tricuspid valve is normal in structure. Tricuspid valve regurgitation is trivial. No evidence of tricuspid stenosis. Aortic Valve: The aortic valve has an indeterminant number of cusps. Aortic valve regurgitation is not visualized. No aortic stenosis is present. Aortic valve mean gradient measures 7.5 mmHg. Aortic valve peak gradient measures 14.3 mmHg. Pulmonic Valve: The pulmonic valve was normal in structure. Pulmonic valve regurgitation is not visualized. No evidence of pulmonic stenosis. Aorta: The aortic root is normal in size and structure. Venous: The inferior vena cava is normal in size with greater than 50% respiratory variability, suggesting right atrial pressure of 3 mmHg. IAS/Shunts: No atrial level shunt detected by color flow Doppler.  LEFT VENTRICLE PLAX 2D LVIDd:         5.30 cm  Diastology LVIDs:         3.70 cm  LV e' medial:    3.81 cm/s LV PW:         1.40 cm  LV E/e' medial:  20.2 LV IVS:        1.60 cm  LV e' lateral:   5.87 cm/s LVOT diam:     1.90 cm  LV E/e' lateral: 13.1 LV SV:  58 LV SV Index:   38 LVOT Area:     2.84 cm  RIGHT VENTRICLE          IVC RV Basal diam:  4.90 cm  IVC diam: 1.40 cm LEFT ATRIUM             Index       RIGHT ATRIUM           Index LA diam:        3.50 cm 2.30 cm/m  RA Area:     13.90 cm LA Vol (A2C):   51.1 ml 33.52 ml/m RA Volume:   34.20 ml  22.44 ml/m LA Vol (A4C):   43.4 ml 28.47 ml/m LA Biplane Vol: 52.1 ml 34.18 ml/m  AORTIC VALVE AV Area (Vmax):    1.68 cm  AV Area (Vmean):   1.63 cm AV Area (VTI):     1.67 cm AV Vmax:           189.00 cm/s AV Vmean:          125.000 cm/s AV VTI:            0.347 m AV Peak Grad:      14.3 mmHg AV Mean Grad:      7.5 mmHg LVOT Vmax:         112.00 cm/s LVOT Vmean:        71.800 cm/s LVOT VTI:          0.204 m LVOT/AV VTI ratio: 0.59  AORTA Ao Root diam: 3.10 cm Ao Asc diam:  2.80 cm MITRAL VALVE MV Area (PHT): 3.99 cm     SHUNTS MV Area VTI:   1.67 cm     Systemic VTI:  0.20 m MV Peak grad:  13.8 mmHg    Systemic Diam: 1.90 cm MV Mean grad:  5.0 mmHg MV Vmax:       1.86 m/s MV Vmean:      107.0 cm/s MV Decel Time: 190 msec MV E velocity: 77.10 cm/s MV A velocity: 176.00 cm/s MV E/A ratio:  0.44 Kirk Ruths MD Electronically signed by Kirk Ruths MD Signature Date/Time: 07/27/2020/1:10:21 PM    Final      TODAY-DAY OF DISCHARGE:  Subjective:   [name] today has no headache,no chest abdominal pain,no new weakness tingling or numbness, feels much better wants to go home today.   Objective:   Blood pressure 91/64, pulse 78, temperature 98 F (36.7 C), temperature source Oral, resp. rate 16, height 5\' 2"  (1.575 m), weight 53.7 kg, SpO2 100 %. No intake or output data in the 24 hours ending 08/01/20 0927 Filed Weights   07/30/20 0500 07/31/20 0500 08/01/20 0500  Weight: 53.7 kg 54.4 kg 53.7 kg    Exam: Awake Alert, Oriented *3, No new F.N deficits, Normal affect [place].AT,PERRAL Supple Neck,No JVD, No cervical lymphadenopathy appriciated.  Symmetrical Chest wall movement, Good air movement bilaterally, CTAB RRR,No Gallops,Rubs or new Murmurs, No Parasternal Heave +ve B.Sounds, Abd Soft, Non tender, No organomegaly appriciated, No rebound -guarding or rigidity. No Cyanosis, Clubbing or edema, No new Rash or bruise   PERTINENT RADIOLOGIC STUDIES: No results found.   PERTINENT LAB RESULTS: CBC: Recent Labs    07/31/20 0207  WBC 10.4  HGB 11.9*  HCT 43.4  PLT 254   CMET CMP     Component  Value Date/Time   NA 141 08/01/2020 0726   K 3.7 08/01/2020 0726   CL 82 (L) 08/01/2020 5284  CO2 45 (H) 08/01/2020 0726   GLUCOSE 80 08/01/2020 0726   BUN 47 (H) 08/01/2020 0726   CREATININE 1.38 (H) 08/01/2020 0726   CALCIUM 9.3 08/01/2020 0726   PROT 6.5 07/26/2020 1329   PROT 6.1 11/09/2018 1137   ALBUMIN 3.3 (L) 07/26/2020 1329   ALBUMIN 4.2 11/09/2018 1137   AST 39 07/26/2020 1329   ALT 30 07/26/2020 1329   ALKPHOS 135 (H) 07/26/2020 1329   BILITOT 2.1 (H) 07/26/2020 1329   BILITOT 0.3 11/09/2018 1137   GFRNONAA 43 (L) 08/01/2020 0726   GFRAA 49 (L) 06/11/2019 1530    GFR Estimated Creatinine Clearance: 33 mL/min (A) (by C-G formula based on SCr of 1.38 mg/dL (H)). No results for input(s): LIPASE, AMYLASE in the last 72 hours. No results for input(s): CKTOTAL, CKMB, CKMBINDEX, TROPONINI in the last 72 hours. Invalid input(s): POCBNP No results for input(s): DDIMER in the last 72 hours. No results for input(s): HGBA1C in the last 72 hours. No results for input(s): CHOL, HDL, LDLCALC, TRIG, CHOLHDL, LDLDIRECT in the last 72 hours. No results for input(s): TSH, T4TOTAL, T3FREE, THYROIDAB in the last 72 hours.  Invalid input(s): FREET3 No results for input(s): VITAMINB12, FOLATE, FERRITIN, TIBC, IRON, RETICCTPCT in the last 72 hours. Coags: No results for input(s): INR in the last 72 hours.  Invalid input(s): PT Microbiology: Recent Results (from the past 240 hour(s))  Resp Panel by RT-PCR (Flu A&B, Covid) Nasopharyngeal Swab     Status: None   Collection Time: 07/26/20  1:29 PM   Specimen: Nasopharyngeal Swab; Nasopharyngeal(NP) swabs in vial transport medium  Result Value Ref Range Status   SARS Coronavirus 2 by RT PCR NEGATIVE NEGATIVE Final    Comment: (NOTE) SARS-CoV-2 target nucleic acids are NOT DETECTED.  The SARS-CoV-2 RNA is generally detectable in upper respiratory specimens during the acute phase of infection. The lowest concentration of SARS-CoV-2  viral copies this assay can detect is 138 copies/mL. A negative result does not preclude SARS-Cov-2 infection and should not be used as the sole basis for treatment or other patient management decisions. A negative result may occur with  improper specimen collection/handling, submission of specimen other than nasopharyngeal swab, presence of viral mutation(s) within the areas targeted by this assay, and inadequate number of viral copies(<138 copies/mL). A negative result must be combined with clinical observations, patient history, and epidemiological information. The expected result is Negative.  Fact Sheet for Patients:  EntrepreneurPulse.com.au  Fact Sheet for Healthcare Providers:  IncredibleEmployment.be  This test is no t yet approved or cleared by the [place] FDA and  has been authorized for detection and/or diagnosis of SARS-CoV-2 by FDA under an Emergency Use Authorization (EUA). This EUA will remain  in effect (meaning this test can be used) for the duration of the COVID-19 declaration under Section 564(b)(1) of the Act, 21 U.S.C.section 360bbb-3(b)(1), unless the authorization is terminated  or revoked sooner.       Influenza A by PCR NEGATIVE NEGATIVE Final   Influenza B by PCR NEGATIVE NEGATIVE Final    Comment: (NOTE) The Xpert Xpress SARS-CoV-2/FLU/RSV plus assay is intended as an aid in the diagnosis of influenza from Nasopharyngeal swab specimens and should not be used as a sole basis for treatment. Nasal washings and aspirates are unacceptable for Xpert Xpress SARS-CoV-2/FLU/RSV testing.  Fact Sheet for Patients: EntrepreneurPulse.com.au  Fact Sheet for Healthcare Providers: IncredibleEmployment.be  This test is not yet approved or cleared by the [place] and has been authorized  for detection and/or diagnosis of SARS-CoV-2 by FDA under an Emergency Use Authorization  (EUA). This EUA will remain in effect (meaning this test can be used) for the duration of the COVID-19 declaration under Section 564(b)(1) of the Act, 21 U.S.C. section 360bbb-3(b)(1), unless the authorization is terminated or revoked.  Performed at [facility], [place] [address] [place], [place] [address]     FURTHER DISCHARGE INSTRUCTIONS:  Get Medicines reviewed and adjusted: Please take all your medications with you for your next visit with your Primary MD  Laboratory/radiological data: Please request your Primary MD to go over all hospital tests and procedure/radiological results at the follow up, please ask your Primary MD to get all Hospital records sent to his/her office.  In some cases, they will be blood work, cultures and biopsy results pending at the time of your discharge. Please request that your primary care M.D. goes through all the records of your hospital data and follows up on these results.  Also Note the following: If you experience worsening of your admission symptoms, develop shortness of breath, life threatening emergency, suicidal or homicidal thoughts you must seek medical attention immediately by calling 911 or calling your MD immediately  if symptoms less severe.  You must read complete instructions/literature along with all the possible adverse reactions/side effects for all the Medicines you take and that have been prescribed to you. Take any new Medicines after you have completely understood and accpet all the possible adverse reactions/side effects.   Do not drive when taking Pain medications or sleeping medications (Benzodaizepines)  Do not take more than prescribed Pain, Sleep and Anxiety Medications. It is not advisable to combine anxiety,sleep and pain medications without talking with your primary care practitioner  Special Instructions: If you have smoked or chewed Tobacco  in the last 2 yrs please stop smoking, stop any regular Alcohol  and or  any Recreational drug use.  Wear Seat belts while driving.  Please note: You were cared for by a hospitalist during your hospital stay. Once you are discharged, your primary care physician will handle any further medical issues. Please note that NO REFILLS for any discharge medications will be authorized once you are discharged, as it is imperative that you return to your primary care physician (or establish a relationship with a primary care physician if you do not have one) for your post hospital discharge needs so that they can reassess your need for medications and monitor your lab values.  Total Time spent coordinating discharge including counseling, education and face to face time equals 35 minutes.  SignedOren Binet 08/01/2020 9:27 AM

## 2020-08-01 NOTE — Progress Notes (Signed)
NURSING PROGRESS NOTE  [name] [id number] Discharge Data: 08/01/2020 1:12 PM Attending Provider: Jonetta Osgood, MD NID:[id number] Everardo Beals, MD     [name] to be D/C'd Home per MD order.  Discussed with the patient the After Visit Summary and all questions fully answered. All IV's discontinued with no bleeding noted. All belongings returned to patient for patient to take home.   Last Vital Signs:  Blood pressure 130/70, pulse 78, temperature 98 F (36.7 C), temperature source Oral, resp. rate 16, height 5\' 2"  (1.575 m), weight 53.7 kg, SpO2 100 %.  Discharge Medication List Allergies as of 08/01/2020       Reactions   Mirtazapine Other (See Comments)   halucinations   Acetaminophen-codeine Nausea And Vomiting   Aspirin Nausea And Vomiting   (can take coated aspirin)   Astelin [azelastine]    Bactrim [sulfamethoxazole-trimethoprim]    Celecoxib Other (See Comments)   "does the opposite of what it is suppose to do"   Ciprofloxacin    Fentanyl    Levaquin [levofloxacin In D5w] Itching   Lipitor [atorvastatin] Other (See Comments)   Myalgias    Neomycin    Salicylates    Sulfa Antibiotics Nausea And Vomiting   Adhesive [tape] Rash   Latex Rash   Tegretol [carbamazepine] Rash        Medication List     TAKE these medications    albuterol (2.5 MG/3ML) 0.083% nebulizer solution Commonly known as: PROVENTIL Take 3 mLs (2.5 mg total) by nebulization every 4 (four) hours as needed for wheezing or shortness of breath (cough).   albuterol 108 (90 Base) MCG/ACT inhaler Commonly known as: ProAir HFA Inhale 2 puffs into the lungs every 4 (four) hours as needed for wheezing or shortness of breath (cough).   ALPRAZolam 0.5 MG tablet Commonly known as: XANAX Take 1 tablet (0.5 mg total) by mouth 2 (two) times daily as needed. for anxiety   aspirin EC 81 MG tablet Take 81 mg by mouth daily.   DULoxetine 60 MG capsule Commonly known as:  CYMBALTA Take 1 capsule (60 mg total) by mouth 2 (two) times daily.   empagliflozin 10 MG Tabs tablet Commonly known as: JARDIANCE Take 1 tablet (10 mg total) by mouth daily.   fluticasone 50 MCG/ACT nasal spray Commonly known as: FLONASE Place 1-2 sprays into both nostrils daily as needed for allergies.   levothyroxine 50 MCG tablet Commonly known as: Synthroid Take 1 tablet (50 mcg total) by mouth daily before breakfast.   midodrine 5 MG tablet Commonly known as: PROAMATINE Take 1 tablet (5 mg total) by mouth 3 (three) times daily with meals.   mirtazapine 15 MG tablet Commonly known as: REMERON TAKE (1) TABLET BY MOUTH AT BEDTIME. What changed: See the new instructions.   mometasone-formoterol 200-5 MCG/ACT Aero Commonly known as: DULERA Inhale 2 puffs into the lungs 2 (two) times daily.   nicotine 21 mg/24hr patch Commonly known as: NICODERM CQ - dosed in mg/24 hours Place 1 patch (21 mg total) onto the skin daily.   OVER THE COUNTER MEDICATION Take 1 capsule by mouth daily. Amberen Multi-Symptom Menopause Relief   oxyCODONE 5 MG immediate release tablet Commonly known as: Oxy IR/ROXICODONE TAKE (1) TABLET BY MOUTH EVERY SIX HOURS AS NEEDED FOR SEVERE PAIN. What changed:  how much to take how to take this when to take this reasons to take this additional instructions   pantoprazole 40 MG tablet Commonly known as: PROTONIX  TAKE ONE TABLET BY MOUTH TWICE DAILY BEFORE A MEAL. What changed: See the new instructions.   pregabalin 50 MG capsule Commonly known as: LYRICA Take 1 capsule (50 mg total) by mouth 3 (three) times daily. Actually 1 in Am and 2 at night- for nerve pain What changed:  how much to take when to take this additional instructions   QUEtiapine 50 MG tablet Commonly known as: SEROquel Take 1-2 tablets (50-100 mg total) by mouth at bedtime.   torsemide 20 MG tablet Commonly known as: DEMADEX Take 1 tablet (20 mg total) by mouth 2 (two)  times daily.   UNABLE TO FIND Med Name: OTC Amberen (for hot flashes)

## 2020-08-01 NOTE — TOC Transition Note (Signed)
Transition of Care [facility]) - CM/SW Discharge Note   Patient Details  Name: [name] MRN: [id number] Date of Birth: 05-Jul-1955  Transition of Care [facility]) CM/SW Contact:  Pollie Friar, RN Phone Number: 08/01/2020, 10:31 AM   Clinical Narrative:    Patient is discharging home today with [facility] services through [facility] and palliative care through [facility].  Pt has oxygen for transport home and oxygen all ready set up at home.  Pt states her spouse will provide transport home.   Final next level of care: [place] Barriers to Discharge: No Barriers Identified   Patient Goals and CMS Choice Patient states their goals for this hospitalization and ongoing recovery are:: to go home CMS Medicare.gov Compare Post Acute Care list provided to:: Patient Choice offered to / list presented to : Patient  Discharge Placement                       Discharge Plan and Services   Discharge Planning Services: CM Consult Post Acute Care Choice: Home Health          DME Arranged: N/A         HH Arranged: PT, OT HH Agency: [place] Date [place]: 07/29/20 Time [place]: 1203 Representative spoke with at [place]: [place] (pending)  Social Determinants of Health (SDOH) Interventions     Readmission Risk Interventions Readmission Risk Prevention Plan 08/26/2018  Transportation Screening Complete  Home Care Screening Complete  Medication Review (RN CM) Complete  Some recent data might be hidden

## 2020-08-01 NOTE — Progress Notes (Signed)
Occupational Therapy Treatment Patient Details Name: [name] MRN: [id number] DOB: 15-Jul-1955 Today's Date: 08/01/2020    History of present illness 65 y.o. F admitted to [facility] due to hypoxia from noncompliant O2 wearing, resulting in hypercapnic respiratory failure. PMH significant for 3L O2-dependent COPD, HTN, HLD, chronic pain under pain management, hypothyroidism, PVD and uterine cancer.   OT comments  Patient declined all ADLs this date but in agreement with written and verbal education on energy conservation and safety with ADLs/IADLs in prep for safe d/c home. Patient reports not wearing O2 in shower at home. Education provided on threading O2 over top of shower door. Patient expressed verbal understanding. Patient also discussed recent depressive episode after death of close friend and decreased motivation for completion of even simple self-care tasks secondary to depression. Husband present at bedside and supportive. OT provided emotional support. Patient continues to be limited by generalized weakness and balance deficits and would benefit from continued acute OT services. HHOT remains appropriate.    Follow Up Recommendations  Home health OT;Supervision - Intermittent    Equipment Recommendations  Tub/shower seat;Other (comment) (RW)    Recommendations for Other Services      Precautions / Restrictions Precautions Precautions: Fall Restrictions Weight Bearing Restrictions: No       Mobility Bed Mobility Overal bed mobility: Modified Independent                  Transfers                 General transfer comment: Refused    Balance                                           ADL either performed or assessed with clinical judgement   ADL                                         General ADL Comments: Patient declined all ADLs this date.     Vision       Perception     Praxis      Cognition  Arousal/Alertness: Awake/alert Behavior During Therapy: Flat affect Overall Cognitive Status: Within Functional Limits for tasks assessed                                          Exercises     Shoulder Instructions       General Comments Husband present at bedside for patient/family education in prep for safe d/c home.    Pertinent Vitals/ Pain       Pain Assessment: No/denies pain  Home Living                                          Prior Functioning/Environment              Frequency  Min 2X/week        Progress Toward Goals  OT Goals(current goals can now be found in the care plan section)  Progress towards OT goals: Progressing toward goals  Acute Rehab OT Goals Patient Stated Goal:  To return home today. OT Goal Formulation: With patient Time For Goal Achievement: 08/11/20 Potential to Achieve Goals: Good ADL Goals Pt Will Transfer to Toilet: with modified independence;ambulating Pt/caregiver will Perform Home Exercise Program: Increased strength;Both right and left upper extremity;Independently;With written HEP provided Additional ADL Goal #1: Pt will tolerate 5 mins of OOB activity with no rest break. Additional ADL Goal #2: Pt will verbalize 3 energy conservation techniques that she can use at home.  Plan Discharge plan remains appropriate;Frequency remains appropriate    Co-evaluation                 AM-PAC OT "6 Clicks" Daily Activity     Outcome Measure   Help from another person eating meals?: None Help from another person taking care of personal grooming?: A Little Help from another person toileting, which includes using toliet, bedpan, or urinal?: A Little Help from another person bathing (including washing, rinsing, drying)?: A Little Help from another person to put on and taking off regular upper body clothing?: None Help from another person to put on and taking off regular lower body clothing?: A  Little 6 Click Score: 20    End of Session Equipment Utilized During Treatment: Oxygen  OT Visit Diagnosis: Unsteadiness on feet (R26.81);Other abnormalities of gait and mobility (R26.89);Muscle weakness (generalized) (M62.81)   Activity Tolerance Patient tolerated treatment well   Patient Left in bed;with call bell/phone within reach;with family/visitor present   Nurse Communication          Time: 8110-3159 OT Time Calculation (min): 11 min  Charges: OT General Charges $OT Visit: 1 Visit OT Treatments $Therapeutic Activity: 8-22 mins  Taylormarie Register H. OTR/L Supplemental OT, Department of rehab services [phone]   Blayde Bacigalupi R H. 08/01/2020, 11:26 AM

## 2020-08-01 NOTE — Care Management Important Message (Signed)
Important Message  Patient Details  Name: [name] MRN: [id number] Date of Birth: 11/06/1955   Medicare Important Message Given:  Yes - Important Message mailed due to current National Emergency   Verbal consent obtained due to current National Emergency  Relationship to patient: Self Contact Name: Hero Call Date: 08/01/20  Time: 1205 Phone: [phone] Outcome: Spoke with contact Important Message mailed to: Patient address on file   Delorse Lek 08/01/2020, 12:06 PM

## 2020-08-01 NOTE — Plan of Care (Signed)

## 2020-08-09 ENCOUNTER — Other Ambulatory Visit: Payer: Self-pay

## 2020-08-09 ENCOUNTER — Encounter: Payer: Self-pay | Admitting: Acute Care

## 2020-08-09 ENCOUNTER — Ambulatory Visit (INDEPENDENT_AMBULATORY_CARE_PROVIDER_SITE_OTHER): Payer: Medicare Other | Admitting: Acute Care

## 2020-08-09 VITALS — BP 130/70 | HR 91 | Ht 62.0 in | Wt 106.4 lb

## 2020-08-09 DIAGNOSIS — J449 Chronic obstructive pulmonary disease, unspecified: Secondary | ICD-10-CM | POA: Diagnosis not present

## 2020-08-09 DIAGNOSIS — J189 Pneumonia, unspecified organism: Secondary | ICD-10-CM | POA: Diagnosis not present

## 2020-08-09 DIAGNOSIS — F17210 Nicotine dependence, cigarettes, uncomplicated: Secondary | ICD-10-CM | POA: Diagnosis not present

## 2020-08-09 DIAGNOSIS — J9622 Acute and chronic respiratory failure with hypercapnia: Secondary | ICD-10-CM

## 2020-08-09 DIAGNOSIS — J9621 Acute and chronic respiratory failure with hypoxia: Secondary | ICD-10-CM

## 2020-08-09 DIAGNOSIS — I5043 Acute on chronic combined systolic (congestive) and diastolic (congestive) heart failure: Secondary | ICD-10-CM | POA: Diagnosis not present

## 2020-08-09 MED ORDER — BREZTRI AEROSPHERE 160-9-4.8 MCG/ACT IN AERO: 2.0000 | INHALATION_SPRAY | Freq: Two times a day (BID) | RESPIRATORY_TRACT | 0 refills | Status: DC

## 2020-08-09 NOTE — Patient Instructions (Addendum)
It is good to see you today. We will give you samples of Breztri 2 puffs twice a day Rinse mouth after use. We have sent you home with paperwork for financial assistance, please complete this and return to the office.  Use your ProAir as rescue Use your oxygen at all times. Wear at 3 L [place] Saturation goal is > 88% at all times Use ProAir as rescue as needed for breakthrough shortness of breath You can use this up to 3 times daily as needed. It is very important that you take your fluid pill every day without fail.  Follow up with cardiology as is scheduled for 09/05/2020. If you notice you have swelling in your legs or feet, call cardiology to be seen sooner.  Follow up with Dr. Melvyn Novas in 3 weeks.( [place] office) She will need additional samples of Breztri at that time.  Needs PFT's per Dr. Melvyn Novas Please contact office for sooner follow up if symptoms do not improve or worsen or seek emergency care
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
## 2020-08-10 ENCOUNTER — Telehealth: Payer: Self-pay | Admitting: Acute Care

## 2020-08-10 ENCOUNTER — Encounter: Payer: Self-pay | Admitting: Acute Care

## 2020-08-10 ENCOUNTER — Telehealth: Payer: Self-pay

## 2020-08-10 NOTE — Telephone Encounter (Signed)
ATC, left VM. 

## 2020-08-10 NOTE — Telephone Encounter (Signed)
Telephone Call/Scheduled Initial Visit SW completed call with patient/PCG to schedule an initial palliative care visit. SW did not receive an answer and requested a call back to discuss palliative care services and schedule a call back.

## 2020-08-10 NOTE — Telephone Encounter (Signed)
Make patient an appointment with pharmacy for education regarding her inhalers ( Which is maintenance vs rescue, why you need to take maintenance every day without fail, why she needs to take her diuretic daily), and for financial assistance in getting her maintenance inhalers . Thanks so much

## 2020-08-11 NOTE — Telephone Encounter (Signed)
ATC, left VM. 

## 2020-08-14 NOTE — Telephone Encounter (Signed)
I called and spoke with patient regarding message. Patient verbalized understanding and will make pharmacy appt when patient comes into office on 08/16/20 for PFT. Nothing further needed.

## 2020-08-21 ENCOUNTER — Other Ambulatory Visit: Payer: Self-pay

## 2020-08-22 ENCOUNTER — Ambulatory Visit (INDEPENDENT_AMBULATORY_CARE_PROVIDER_SITE_OTHER): Payer: Medicare Other | Admitting: Internal Medicine

## 2020-08-22 VITALS — BP 100/70 | HR 91 | Temp 98.1°F | Wt 107.3 lb

## 2020-08-22 DIAGNOSIS — N179 Acute kidney failure, unspecified: Secondary | ICD-10-CM

## 2020-08-22 DIAGNOSIS — J9621 Acute and chronic respiratory failure with hypoxia: Secondary | ICD-10-CM

## 2020-08-22 DIAGNOSIS — I5043 Acute on chronic combined systolic (congestive) and diastolic (congestive) heart failure: Secondary | ICD-10-CM

## 2020-08-22 DIAGNOSIS — Z09 Encounter for follow-up examination after completed treatment for conditions other than malignant neoplasm: Secondary | ICD-10-CM | POA: Diagnosis not present

## 2020-08-22 DIAGNOSIS — J449 Chronic obstructive pulmonary disease, unspecified: Secondary | ICD-10-CM | POA: Diagnosis not present

## 2020-08-22 DIAGNOSIS — I951 Orthostatic hypotension: Secondary | ICD-10-CM

## 2020-08-22 DIAGNOSIS — R296 Repeated falls: Secondary | ICD-10-CM

## 2020-08-22 DIAGNOSIS — J9622 Acute and chronic respiratory failure with hypercapnia: Secondary | ICD-10-CM

## 2020-08-22 NOTE — Patient Instructions (Addendum)
-  Nice seeing you today!!  -Lab work today; will notify you once results are available.  -Will arrange for you to receive physical therapy at home.  -Make sure you schedule follow up appointments with cardiology.

## 2020-08-22 NOTE — Progress Notes (Signed)
Established Patient Office Visit     This visit occurred during the SARS-CoV-2 public health emergency.  Safety protocols were in place, including screening questions prior to the visit, additional usage of staff PPE, and extensive cleaning of exam room while observing appropriate contact time as indicated for disinfecting solutions.    CC/Reason for Visit: Hospital follow-up  HPI: [name] is a 65 y.o. female who is coming in today for the above mentioned reasons.  She was hospitalized 07/26/2020-08/01/2020 for acute on chronic hypoxemic respiratory failure due to COPD with acute exacerbation and acute on chronic combined heart failure.  During that hospitalization she also developed acute renal failure that had improved.  She also has a history significant for hypothyroidism, depression/anxiety, ongoing nicotine dependence of about half pack a day, chronic pain syndrome, PAD and orthostatic hypotension on midodrine.  She has in fact fallen 4 times since her hospital discharge.  Even though hospital discharge summary mentions home health services to be arranged, this has not yet happened.  She has followed with pulmonary, she has PFTs scheduled for later this month.  She has yet to see cardiology.  In the office her O2 sats were 84 to 86% on 3 L, we were able to get her up to 90 to 91% after increasing her home oxygen to 4 L.  She has not suffered any injuries with her falls.  She tells me that at times she feels like she might blackout right before her falls, sometimes she just feels dizzy.  She is not sure that she is taking midodrine.  Past Medical/Surgical History: Past Medical History:  Diagnosis Date   Anemia    Anxiety and depression    Bronchitis    Chronic pain    COPD (chronic obstructive pulmonary disease) (HCC)    Gastroesophageal reflux disease    History of cardiac catheterization    Normal coronary arteries 2015   Hyperlipidemia    Hypertension    Hypothyroid     Migraine headache    Near syncope    On home oxygen therapy    Started 08/2018   Peripheral vascular disease ([place])    Pneumonia    Tobacco abuse    1/2 pack per day   Uterine cancer [facility])     Past Surgical History:  Procedure Laterality Date   ABDOMINAL HYSTERECTOMY     without oophorectomy for neoplastic disease   BACK SURGERY     BIOPSY  10/22/2018   Procedure: BIOPSY;  Surgeon: Danie Binder, MD;  [facility];  Service: Endoscopy;;  duodenal, gastric   CARDIAC CATHETERIZATION  07/2013   normal coronary arteries   CARPAL TUNNEL RELEASE     Left   CHOLECYSTECTOMY  2007   DILATION AND CURETTAGE OF UTERUS     ESOPHAGOGASTRODUODENOSCOPY (EGD) WITH PROPOFOL N/A 10/22/2018   gastritis (mild reactive gastropathy, negative duodenal biopsies for celiac).    LAPAROTOMY     LEFT HEART CATHETERIZATION WITH CORONARY ANGIOGRAM N/A 07/12/2013   Procedure: LEFT HEART CATHETERIZATION WITH CORONARY ANGIOGRAM;  Surgeon: Leonie Man, MD;  Location: [facility] CATH LAB;  Service: Cardiovascular;  Laterality: N/A;   LUMBAR LAMINECTOMY  x3   Left; complicated by neurologic dysfunction   NASAL SINUS SURGERY      Social History:  reports that she has been smoking cigarettes. She started smoking about 47 years ago. She has a 10.00 pack-year smoking history. She has never used smokeless tobacco. She reports that  she does not drink alcohol and does not use drugs.  Allergies: Allergies  Allergen Reactions   Mirtazapine Other (See Comments)    halucinations   Acetaminophen-Codeine Nausea And Vomiting   Aspirin Nausea And Vomiting    (can take coated aspirin)   Astelin [Azelastine]    Bactrim [Sulfamethoxazole-Trimethoprim]    Celecoxib Other (See Comments)    "does the opposite of what it is suppose to do"   Ciprofloxacin    Fentanyl    Levaquin [Levofloxacin In D5w] Itching   Lipitor [Atorvastatin] Other (See Comments)    Myalgias    Neomycin    Salicylates    Sulfa Antibiotics  Nausea And Vomiting   Adhesive [Tape] Rash   Latex Rash   Tegretol [Carbamazepine] Rash    Family History:  Family History  Problem Relation Age of Onset   Colon polyps Mother    Deep vein thrombosis Mother    Heart disease Mother    Hypertension Mother    Bleeding Disorder Mother    Hypertension Sister    Heart disease Brother        before age 3   Hypertension Brother      Current Outpatient Medications:    albuterol (PROAIR HFA) 108 (90 Base) MCG/ACT inhaler, Inhale 2 puffs into the lungs every 4 (four) hours as needed for wheezing or shortness of breath (cough)., Disp: 18 g, Rfl: 11   albuterol (PROVENTIL) (2.5 MG/3ML) 0.083% nebulizer solution, Take 3 mLs (2.5 mg total) by nebulization every 4 (four) hours as needed for wheezing or shortness of breath (cough)., Disp: 75 mL, Rfl: 12   ALPRAZolam (XANAX) 0.5 MG tablet, Take 1 tablet (0.5 mg total) by mouth 2 (two) times daily as needed. for anxiety, Disp: 60 tablet, Rfl: 5   aspirin EC 81 MG tablet, Take 81 mg by mouth daily., Disp: , Rfl:    Budeson-Glycopyrrol-Formoterol (BREZTRI AEROSPHERE) 160-9-4.8 MCG/ACT AERO, Inhale 2 puffs into the lungs in the morning and at bedtime., Disp: 4.8 g, Rfl: 0   DULoxetine (CYMBALTA) 60 MG capsule, Take 1 capsule (60 mg total) by mouth 2 (two) times daily., Disp: 60 capsule, Rfl: 5   empagliflozin (JARDIANCE) 10 MG TABS tablet, Take 1 tablet (10 mg total) by mouth daily., Disp: 30 tablet, Rfl: 0   fluticasone (FLONASE) 50 MCG/ACT nasal spray, Place 1-2 sprays into both nostrils daily as needed for allergies. , Disp: , Rfl:    levothyroxine (SYNTHROID) 50 MCG tablet, Take 1 tablet (50 mcg total) by mouth daily before breakfast., Disp: 30 tablet, Rfl: 5   midodrine (PROAMATINE) 5 MG tablet, Take 1 tablet (5 mg total) by mouth 3 (three) times daily with meals., Disp: 90 tablet, Rfl: 5   mirtazapine (REMERON) 15 MG tablet, TAKE (1) TABLET BY MOUTH AT BEDTIME., Disp: 30 tablet, Rfl: 5    mometasone-formoterol (DULERA) 200-5 MCG/ACT AERO, Inhale 2 puffs into the lungs 2 (two) times daily., Disp: 1 each, Rfl: 0   nicotine (NICODERM CQ - DOSED IN MG/24 HOURS) 21 mg/24hr patch, Place 1 patch (21 mg total) onto the skin daily., Disp: 28 patch, Rfl: 0   OVER THE COUNTER MEDICATION, Take 1 capsule by mouth daily. Amberen Multi-Symptom Menopause Relief, Disp: , Rfl:    oxyCODONE (OXY IR/ROXICODONE) 5 MG immediate release tablet, TAKE (1) TABLET BY MOUTH EVERY SIX HOURS AS NEEDED FOR SEVERE PAIN., Disp: 120 tablet, Rfl: 0   pantoprazole (PROTONIX) 40 MG tablet, TAKE ONE TABLET BY MOUTH TWICE DAILY BEFORE A  MEAL., Disp: 180 tablet, Rfl: 2   pregabalin (LYRICA) 50 MG capsule, Take 1 capsule (50 mg total) by mouth 3 (three) times daily. Actually 1 in Am and 2 at night- for nerve pain, Disp: 90 capsule, Rfl: 5   QUEtiapine (SEROQUEL) 50 MG tablet, Take 1-2 tablets (50-100 mg total) by mouth at bedtime., Disp: 60 tablet, Rfl: 3   torsemide (DEMADEX) 20 MG tablet, Take 1 tablet (20 mg total) by mouth 2 (two) times daily., Disp: 60 tablet, Rfl: 0   UNABLE TO FIND, Med Name: OTC Amberen (for hot flashes), Disp: , Rfl:   Review of Systems:  Constitutional: Denies fever, chills, diaphoresis, appetite change and fatigue.  HEENT: Denies photophobia, eye pain, redness, hearing loss, ear pain, congestion, sore throat, rhinorrhea, sneezing, mouth sores, trouble swallowing, neck pain, neck stiffness and tinnitus.   Respiratory: Denies SOB, DOE, cough, chest tightness,  and wheezing.   Cardiovascular: Denies chest pain, palpitations and leg swelling.  Gastrointestinal: Denies nausea, vomiting, abdominal pain, diarrhea, constipation, blood in stool and abdominal distention.  Genitourinary: Denies dysuria, urgency, frequency, hematuria, flank pain and difficulty urinating.  Endocrine: Denies: hot or cold intolerance, sweats, changes in hair or nails, polyuria, polydipsia. Musculoskeletal: Denies myalgias,  back pain, joint swelling, arthralgias and gait problem.  Skin: Denies pallor, rash and wound.  Neurological: Denies dizziness, seizures,  light-headedness, numbness and headaches.  Hematological: Denies adenopathy. Easy bruising, personal or family bleeding history  Psychiatric/Behavioral: Denies suicidal ideation, mood changes, confusion, nervousness, sleep disturbance and agitation    Physical Exam: Vitals:   08/22/20 1531  BP: 100/70  Pulse: 91  Temp: 98.1 F (36.7 C)  TempSrc: Oral  SpO2: (!) 86%  Weight: 107 lb 4.8 oz (48.7 kg)    Body mass index is 19.63 kg/m.   Constitutional: NAD, calm, comfortable, appears quite frail Eyes: PERRL, lids and conjunctivae normal ENMT: Mucous membranes are moist.  Respiratory: Poor air movement, normal respiratory effort, no accessory muscle use Cardiovascular: Regular rate and rhythm, no murmurs / rubs / gallops. No extremity edema. 2+ pedal pulses. No carotid bruits.  Abdomen: no tenderness, no masses palpated. No hepatosplenomegaly. Bowel sounds positive.  Musculoskeletal: no clubbing / cyanosis. No joint deformity upper and lower extremities. Good ROM, no contractures. Normal muscle tone.  Skin: no rashes, lesions, ulcers. No induration Neurologic: CN 2-12 grossly intact. Sensation intact, DTR normal. Strength 5/5 in all 4.  Psychiatric: Normal judgment and insight. Alert and oriented x 3. Normal mood.    Impression and Plan:  Hospital discharge follow-up  Acute on chronic respiratory failure with hypoxia and hypercapnia ([place]) - Plan: AMB Referral to [place]  Acute on chronic combined systolic and diastolic CHF (congestive heart failure) ([place]) - Plan: AMB Referral to [place], CBC with Differential/Platelet  COPD  GOLD II / still smoking  - Plan: AMB Referral to [place]  Acute kidney injury ([place]) - Plan: Comprehensive metabolic panel  Frequent falls  Orthostatic  hypotension  -Considerable amount of time spent reviewing hospital records. -She continues to be hypoxemic, we have discussed increasing her home oxygen delivery until she gets her sats around 90%. -I will order CMP and CBC as were requested by discharging physician. -I will arrange for her to get home health PT and OT especially given her frequent falls.  I suspect her falls are related to orthostatic hypotension.  I have asked that she pay attention and make sure that she is taking her midodrine 3 times a  day as instructed. -I will make sure she has cardiology follow-up, she has already followed up with pulmonary and in fact has PFTs scheduled for later this month. -We have spent some time discussing smoking cessation, this does not appear to be something that she would like to accomplish at this time.  Time spent: 31 minutes reviewing remarkable informing patient and formulating plan of care.   Patient Instructions  -Nice seeing you today!!  -Lab work today; will notify you once results are available.  -Will arrange for you to receive physical therapy at home.  -Make sure you schedule follow up appointments with cardiology.      Lelon Frohlich, MD [facility] at [facility]

## 2020-08-23 ENCOUNTER — Ambulatory Visit (INDEPENDENT_AMBULATORY_CARE_PROVIDER_SITE_OTHER): Payer: Medicare Other | Admitting: Internal Medicine

## 2020-08-23 ENCOUNTER — Other Ambulatory Visit: Payer: Self-pay

## 2020-08-23 DIAGNOSIS — J449 Chronic obstructive pulmonary disease, unspecified: Secondary | ICD-10-CM

## 2020-08-23 DIAGNOSIS — J189 Pneumonia, unspecified organism: Secondary | ICD-10-CM

## 2020-08-23 DIAGNOSIS — J9621 Acute and chronic respiratory failure with hypoxia: Secondary | ICD-10-CM

## 2020-08-23 DIAGNOSIS — I5043 Acute on chronic combined systolic (congestive) and diastolic (congestive) heart failure: Secondary | ICD-10-CM | POA: Diagnosis not present

## 2020-08-23 DIAGNOSIS — J9622 Acute and chronic respiratory failure with hypercapnia: Secondary | ICD-10-CM

## 2020-08-23 LAB — COMPREHENSIVE METABOLIC PANEL
ALT: 9 U/L (ref 0–35)
AST: 19 U/L (ref 0–37)
Albumin: 4.1 g/dL (ref 3.5–5.2)
Alkaline Phosphatase: 78 U/L (ref 39–117)
BUN: 24 mg/dL — ABNORMAL HIGH (ref 6–23)
CO2: 47 mEq/L — ABNORMAL HIGH (ref 19–32)
Calcium: 11.1 mg/dL — ABNORMAL HIGH (ref 8.4–10.5)
Chloride: 83 mEq/L — ABNORMAL LOW (ref 96–112)
Creatinine, Ser: 1.56 mg/dL — ABNORMAL HIGH (ref 0.40–1.20)
GFR: 35.05 mL/min — ABNORMAL LOW (ref >60.00)
Glucose, Bld: 83 mg/dL (ref 70–99)
Potassium: 4.6 mEq/L (ref 3.5–5.1)
Sodium: 138 mEq/L (ref 135–145)
Total Bilirubin: 0.6 mg/dL (ref 0.2–1.2)
Total Protein: 7.4 g/dL (ref 6.0–8.3)

## 2020-08-23 LAB — CBC WITH DIFFERENTIAL/PLATELET
Basophils Absolute: 0.1 10*3/uL (ref 0.0–0.1)
Basophils Relative: 1 % (ref 0.0–3.0)
Eosinophils Absolute: 0.1 10*3/uL (ref 0.0–0.7)
Eosinophils Relative: 0.6 % (ref 0.0–5.0)
HCT: 42 % (ref 36.0–46.0)
Hemoglobin: 13 g/dL (ref 12.0–15.0)
Lymphocytes Relative: 26.6 % (ref 12.0–46.0)
Lymphs Abs: 2.7 10*3/uL (ref 0.7–4.0)
MCHC: 31 g/dL (ref 30.0–36.0)
MCV: 82.6 fl (ref 78.0–100.0)
Monocytes Absolute: 1.3 10*3/uL — ABNORMAL HIGH (ref 0.1–1.0)
Monocytes Relative: 13 % — ABNORMAL HIGH (ref 3.0–12.0)
Neutro Abs: 6 10*3/uL (ref 1.4–7.7)
Neutrophils Relative %: 58.8 % (ref 43.0–77.0)
Platelets: 742 10*3/uL — ABNORMAL HIGH (ref 150.0–400.0)
RBC: 5.08 Mil/uL (ref 3.87–5.11)
RDW: 23 % — ABNORMAL HIGH (ref 11.5–15.5)
WBC: 10.2 10*3/uL (ref 4.0–10.5)

## 2020-08-23 LAB — PULMONARY FUNCTION TEST
DL/VA % pred: 81 %
DL/VA: 3.47 ml/min/mmHg/L
DLCO unc % pred: 36 %
DLCO unc: 6.84 ml/min/mmHg
FEF 25-75 Post: 0.2 L/sec
FEF 25-75 Pre: 0.22 L/sec
FEF2575-%Change-Post: -10 %
FEF2575-%Pred-Post: 9 %
FEF2575-%Pred-Pre: 10 %
FEV1-%Change-Post: -9 %
FEV1-%Pred-Post: 21 %
FEV1-%Pred-Pre: 23 %
FEV1-Post: 0.49 L
FEV1-Pre: 0.54 L
FEV1FVC-%Change-Post: -5 %
FEV1FVC-%Pred-Pre: 61 %
FEV6-%Change-Post: -4 %
FEV6-%Pred-Post: 37 %
FEV6-%Pred-Pre: 39 %
FEV6-Post: 1.08 L
FEV6-Pre: 1.13 L
FEV6FVC-%Change-Post: 0 %
FEV6FVC-%Pred-Post: 103 %
FEV6FVC-%Pred-Pre: 104 %
FVC-%Change-Post: -3 %
FVC-%Pred-Post: 36 %
FVC-%Pred-Pre: 37 %
FVC-Post: 1.09 L
FVC-Pre: 1.13 L
Post FEV1/FVC ratio: 45 %
Post FEV6/FVC ratio: 99 %
Pre FEV1/FVC ratio: 48 %
Pre FEV6/FVC Ratio: 100 %
RV % pred: 79 %
RV: 1.55 L
TLC % pred: 56 %
TLC: 2.69 L

## 2020-08-23 NOTE — Progress Notes (Signed)
Full PFT performed today. °

## 2020-08-23 NOTE — Patient Instructions (Signed)
Full PFT performed today. °

## 2020-08-28 ENCOUNTER — Telehealth: Payer: Self-pay | Admitting: *Deleted

## 2020-08-28 NOTE — Chronic Care Management (AMB) (Signed)
  Chronic Care Management   Outreach Note  08/28/2020 Name: Shivika Engelhard MRN: [id number] DOB: 06-14-1955  Hadija Waag Moerbe is a 65 y.o. year old female who is a primary care patient of [name], [name], MD. I reached out to [name] by phone today in response to a referral sent by Ms. Thersa Salt Pascal's PCP, [name], [name], MD      An unsuccessful telephone outreach was attempted today. The patient was referred to the case management team for assistance with care management and care coordination.   Follow Up Plan: A HIPAA compliant phone message was left for the patient providing contact information and requesting a return call. The care management team will reach out to the patient again over the next 7 days.  If patient returns call to provider office, please advise to call [place] at [phone].  [place] Management  Direct Dial: [phone]

## 2020-08-30 ENCOUNTER — Telehealth: Payer: Self-pay

## 2020-08-30 NOTE — Telephone Encounter (Signed)
(  2:43 pm) Palliative care SW scheduled initial palliative care visit with patient for 09/07/20 '@1'$ :30 pm.

## 2020-09-04 NOTE — Chronic Care Management (AMB) (Signed)
  Chronic Care Management   Outreach Note  09/04/2020 Name: [name] MRN: [id number] DOB: 1955/07/06  [name] is a 65 y.o. year old female who is a primary care patient of [name], [name], MD. I reached out to [name] by phone today in response to a referral sent by [name]'s PCP, [name], [name], MD.      A second unsuccessful telephone outreach was attempted today. The patient was referred to the case management team for assistance with care management and care coordination.   Follow Up Plan: A HIPAA compliant phone message was left for the patient providing contact information and requesting a return call. The care management team will reach out to the patient again over the next 7 days.  If patient returns call to provider office, please advise to call [place] [phone].  [facility] Management  Direct Dial: [phone]

## 2020-09-04 NOTE — Progress Notes (Deleted)
Cardiology Office Note  Date: 09/04/2020   ID: [name], [name] [birth year], MRN [id number]  PCP:  [name], [name], MD  Cardiologist:  Rozann Lesches, MD Electrophysiologist:  None   Chief Complaint: 1 month follow-up   History of Present Illness: [name] is a 65 y.o. female with a history of HTN, HLD, hypothyroidism, COPD, PVD, tobacco abuse, near syncope, uterine cancer, on home O2, carotid artery disease, left subclavian steal  She was last seen by Dr. Domenic Polite on 03/01/2020 for orthostatic hypotension and bilateral carotid artery disease.  She stated intermittently her blood pressure was in the 90s.  She described orthostatic dizziness at times but no syncope.  She had been on midodrine but ran out recently and needed a refill.  History of chronic lower back and sciatic pain with previous lumbar surgeries.  He mentions Seroquel, oxycodone and Cymbalta could also cause orthostasis and that it was very likely she had a component of autonomic dysfunction in setting of treatment of chronic pain.  Her orthostatic measurements on that visit were normal.  She was currently not on statin therapy and was unable to recall whether she was statin intolerant.  Plan was to continue midodrine 5 mg 3 times daily and to improve her hydration.  Plan was to schedule follow-up carotid Doppler studies.  He recommended she follow-up with PCP for lipid panel and could consider initiation of Crestor.  History of normal coronary arteries at cardiac catheterization in 2015     She was recently hospitalized from 07/26/2020 to 08/01/2020 for acute on chronic hypoxemic respiratory failure due to COPD with acute exacerbation acute on chronic combined heart failure.  She also developed renal failure which had improved.  She had follow-up with her primary care provider on 08/22/2020 and mentioned she had fallen approximately 4 times since her hospital discharge.  She mentioned having PFTs scheduled  for later on in the month.  In the office her O2 sats were 84 to 86% on 3 L.  O2 sats increased to 90 to 91% at her after increasing her oxygen to 4 L.  She was having complaints of feeling like she might blackout right before her falls.  She stated sometimes she just felt dizzy.  She was not sure she was taking midodrine.  She was continuing to smoke.  Her PCP ordered a CBC and CMP.  Past Medical History:  Diagnosis Date   Anemia    Anxiety and depression    Bronchitis    Chronic pain    COPD (chronic obstructive pulmonary disease) (HCC)    Gastroesophageal reflux disease    History of cardiac catheterization    Normal coronary arteries 2015   Hyperlipidemia    Hypertension    Hypothyroid    Migraine headache    Near syncope    On home oxygen therapy    Started 08/2018   Peripheral vascular disease ([place])    Pneumonia    Tobacco abuse    1/2 pack per day   Uterine cancer [facility])     Past Surgical History:  Procedure Laterality Date   ABDOMINAL HYSTERECTOMY     without oophorectomy for neoplastic disease   BACK SURGERY     BIOPSY  10/22/2018   Procedure: BIOPSY;  Surgeon: Danie Binder, MD;  [facility];  Service: Endoscopy;;  duodenal, gastric   CARDIAC CATHETERIZATION  07/2013   normal coronary arteries   CARPAL TUNNEL RELEASE     Left  CHOLECYSTECTOMY  2007   DILATION AND CURETTAGE OF UTERUS     ESOPHAGOGASTRODUODENOSCOPY (EGD) WITH PROPOFOL N/A 10/22/2018   gastritis (mild reactive gastropathy, negative duodenal biopsies for celiac).    LAPAROTOMY     LEFT HEART CATHETERIZATION WITH CORONARY ANGIOGRAM N/A 07/12/2013   Procedure: LEFT HEART CATHETERIZATION WITH CORONARY ANGIOGRAM;  Surgeon: Leonie Man, MD;  Location: [facility] CATH LAB;  Service: Cardiovascular;  Laterality: N/A;   LUMBAR LAMINECTOMY  x3   Left; complicated by neurologic dysfunction   NASAL SINUS SURGERY      Current Outpatient Medications  Medication Sig Dispense Refill   albuterol (PROAIR  HFA) 108 (90 Base) MCG/ACT inhaler Inhale 2 puffs into the lungs every 4 (four) hours as needed for wheezing or shortness of breath (cough). 18 g 11   albuterol (PROVENTIL) (2.5 MG/3ML) 0.083% nebulizer solution Take 3 mLs (2.5 mg total) by nebulization every 4 (four) hours as needed for wheezing or shortness of breath (cough). 75 mL 12   ALPRAZolam (XANAX) 0.5 MG tablet Take 1 tablet (0.5 mg total) by mouth 2 (two) times daily as needed. for anxiety 60 tablet 5   aspirin EC 81 MG tablet Take 81 mg by mouth daily.     Budeson-Glycopyrrol-Formoterol (BREZTRI AEROSPHERE) 160-9-4.8 MCG/ACT AERO Inhale 2 puffs into the lungs in the morning and at bedtime. 4.8 g 0   DULoxetine (CYMBALTA) 60 MG capsule Take 1 capsule (60 mg total) by mouth 2 (two) times daily. 60 capsule 5   empagliflozin (JARDIANCE) 10 MG TABS tablet Take 1 tablet (10 mg total) by mouth daily. 30 tablet 0   fluticasone (FLONASE) 50 MCG/ACT nasal spray Place 1-2 sprays into both nostrils daily as needed for allergies.      levothyroxine (SYNTHROID) 50 MCG tablet Take 1 tablet (50 mcg total) by mouth daily before breakfast. 30 tablet 5   midodrine (PROAMATINE) 5 MG tablet Take 1 tablet (5 mg total) by mouth 3 (three) times daily with meals. 90 tablet 5   mirtazapine (REMERON) 15 MG tablet TAKE (1) TABLET BY MOUTH AT BEDTIME. 30 tablet 5   mometasone-formoterol (DULERA) 200-5 MCG/ACT AERO Inhale 2 puffs into the lungs 2 (two) times daily. 1 each 0   nicotine (NICODERM CQ - DOSED IN MG/24 HOURS) 21 mg/24hr patch Place 1 patch (21 mg total) onto the skin daily. 28 patch 0   OVER THE COUNTER MEDICATION Take 1 capsule by mouth daily. Amberen Multi-Symptom Menopause Relief     oxyCODONE (OXY IR/ROXICODONE) 5 MG immediate release tablet TAKE (1) TABLET BY MOUTH EVERY SIX HOURS AS NEEDED FOR SEVERE PAIN. 120 tablet 0   pantoprazole (PROTONIX) 40 MG tablet TAKE ONE TABLET BY MOUTH TWICE DAILY BEFORE A MEAL. 180 tablet 2   pregabalin (LYRICA) 50 MG  capsule Take 1 capsule (50 mg total) by mouth 3 (three) times daily. Actually 1 in Am and 2 at night- for nerve pain 90 capsule 5   QUEtiapine (SEROQUEL) 50 MG tablet Take 1-2 tablets (50-100 mg total) by mouth at bedtime. 60 tablet 3   torsemide (DEMADEX) 20 MG tablet Take 1 tablet (20 mg total) by mouth 2 (two) times daily. 60 tablet 0   UNABLE TO FIND Med Name: OTC Amberen (for hot flashes)     No current facility-administered medications for this visit.   Allergies:  Mirtazapine, Acetaminophen-codeine, Aspirin, Astelin [azelastine], Bactrim [sulfamethoxazole-trimethoprim], Celecoxib, Ciprofloxacin, Fentanyl, Levaquin [levofloxacin in d5w], Lipitor [atorvastatin], Neomycin, Salicylates, Sulfa antibiotics, Adhesive [tape], Latex, and Tegretol [carbamazepine]  Social History: The patient  reports that she has been smoking cigarettes. She started smoking about 48 years ago. She has a 10.00 pack-year smoking history. She has never used smokeless tobacco. She reports that she does not drink alcohol and does not use drugs.   Family History: The patient's family history includes Bleeding Disorder in her mother; Colon polyps in her mother; Deep vein thrombosis in her mother; Heart disease in her brother and mother; Hypertension in her brother, mother, and sister.   ROS:  Please see the history of present illness. Otherwise, complete review of systems is positive for none.  All other systems are reviewed and negative.   Physical Exam: VS:  There were no vitals taken for this visit., BMI There is no height or weight on file to calculate BMI.  Wt Readings from Last 3 Encounters:  08/22/20 107 lb 4.8 oz (48.7 kg)  08/09/20 106 lb 6.4 oz (48.3 kg)  08/01/20 118 lb 6.2 oz (53.7 kg)    General: Patient appears comfortable at rest. HEENT: Conjunctiva and lids normal, oropharynx clear with moist mucosa. Neck: Supple, no elevated JVP or carotid bruits, no thyromegaly. Lungs: Clear to auscultation,  nonlabored breathing at rest. Cardiac: Regular rate and rhythm, no S3 or significant systolic murmur, no pericardial rub. Abdomen: Soft, nontender, no hepatomegaly, bowel sounds present, no guarding or rebound. Extremities: No pitting edema, distal pulses 2+. Skin: Warm and dry. Musculoskeletal: No kyphosis. Neuropsychiatric: Alert and oriented x3, affect grossly appropriate.  ECG:  {EKG/Telemetry Strips [phone]}  Recent Labwork: 07/26/2020: B Natriuretic Peptide 3,606.8 07/28/2020: Magnesium 1.4; TSH 0.336 08/22/2020: ALT 9; AST 19; BUN 24; Creatinine, Ser 1.56; Hemoglobin 13.0; Platelets 742.0 Repeated and verified X2.; Potassium 4.6; Sodium 138     Component Value Date/Time   CHOL 129 07/28/2020 0125   TRIG 108 07/28/2020 0125   HDL 28 (L) 07/28/2020 0125   CHOLHDL 4.6 07/28/2020 0125   VLDL 22 07/28/2020 0125   LDLCALC 79 07/28/2020 0125    Other Studies Reviewed Today: \ Echocardiogram 08/25/2018:  1. The left ventricle has low normal systolic function, with an ejection  fraction of 50-55%. The cavity size was normal. There is moderately  increased left ventricular wall thickness. Left ventricular diastolic  Doppler parameters are consistent with  impaired relaxation. Elevated mean left atrial pressure.   2. The right ventricle has normal systolic function. The cavity was  normal. There is no increase in right ventricular wall thickness.   3. Left atrial size was mildly dilated.   4. Right atrial size was mildly dilated.   5. No evidence of mitral valve stenosis.   6. No stenosis of the aortic valve.   7. The aortic root is normal in size and structure.   8. Pulmonary hypertension is indeterminant, inadequate TR jet.  Assessment and Plan:  No diagnosis found.   Medication Adjustments/Labs and Tests Ordered: Current medicines are reviewed at length with the patient today.  Concerns regarding medicines are outlined above.   Disposition: Follow-up with  ***  Signed, Levell July, NP 09/04/2020 8:55 AM    [place] at [facility] [place], [place], [place] [address] Phone: [phone]; Fax: [phone]

## 2020-09-05 ENCOUNTER — Ambulatory Visit: Payer: Medicare Other | Admitting: Family Medicine

## 2020-09-05 ENCOUNTER — Telehealth: Payer: Self-pay | Admitting: Internal Medicine

## 2020-09-05 ENCOUNTER — Telehealth: Payer: Self-pay | Admitting: *Deleted

## 2020-09-05 DIAGNOSIS — I951 Orthostatic hypotension: Secondary | ICD-10-CM

## 2020-09-05 DIAGNOSIS — I779 Disorder of arteries and arterioles, unspecified: Secondary | ICD-10-CM

## 2020-09-05 MED ORDER — OXYCODONE HCL 5 MG PO TABS: ORAL_TABLET | ORAL | 0 refills | Status: DC

## 2020-09-05 NOTE — Telephone Encounter (Signed)
Return [name] call, she reports she needs her Oxycodone. Her last office visit was on 07/26/2020, Dr Dagoberto Ligas note was reviewed. [name] was admitted to [facility] on 07/26/2020 for Acute on chronic hypoxic respiratory failure due to COPD exacerbation and decompensated combined systolic/diastolic heart failure. She was discharged on 08/01/2020, discharge summary was reviewed.  Oxycodone e-scribed today. [name] will be scheduled for an appointment with Dr Dagoberto Ligas. She verbalizes understanding.

## 2020-09-05 NOTE — Telephone Encounter (Signed)
[name] is requesting a refill on her oxycodone. Her last fill date per PMP was 07/18/20 #120.  She does not currently have an appt scheduled. (Last visit she was sent to ED with low sats).

## 2020-09-05 NOTE — Progress Notes (Signed)
  Chronic Care Management   Note  09/05/2020 Name: [name] MRN: [id number] DOB: Mar 25, 1955  [name] is a 65 y.o. year old female who is a primary care patient of [name], [name], MD. I reached out to [name] by phone today in response to a referral sent by Ms. Thersa Salt Rabelo's PCP, [name], [name], MD.   Ms. Kowaleski was given information about Chronic Care Management services today including:  CCM service includes personalized support from designated clinical staff supervised by her physician, including individualized plan of care and coordination with other care providers 24/7 contact phone numbers for assistance for urgent and routine care needs. Service will only be billed when office clinical staff spend 20 minutes or more in a month to coordinate care. Only one practitioner may furnish and bill the service in a calendar month. The patient may stop CCM services at any time (effective at the end of the month) by phone call to the office staff.   Patient agreed to services and verbal consent obtained.   Follow up plan:   Tatjana Secretary/administrator

## 2020-09-05 NOTE — Chronic Care Management (AMB) (Signed)
  Chronic Care Management   Outreach Note  09/05/2020 Name: Norva Parga MRN: [id number] DOB: Nov 28, 1955  Mackie Barbre Buttrum is a 65 y.o. year old female who is a primary care patient of [name], [name], MD. I reached out to [name] by phone today in response to a referral sent by Ms. Thersa Salt Fiebig's PCP, [name], [name], MD      An unsuccessful telephone outreach was attempted today. The patient was referred to the case management team for assistance with care management and care coordination.   Follow Up Plan: A HIPAA compliant phone message was left for the patient providing contact information and requesting a return call.  The care management team will reach out to the patient again over the next 7 days.  If patient returns call to provider office, please advise to call [place] at [phone].  [facility]  Direct Dial: [phone]

## 2020-09-05 NOTE — Chronic Care Management (AMB) (Signed)
  Chronic Care Management   Note  09/05/2020 Name: [name] MRN: [id number] DOB: 10-08-1955  [name] is a 65 y.o. year old female who is a primary care patient of [name], [name], MD. I reached out to [name] by phone today in response to a referral sent by [name] PCP, [name], [name], MD      [name] was given information about Chronic Care Management services today including:  CCM service includes personalized support from designated clinical staff supervised by her physician, including individualized plan of care and coordination with other care providers 24/7 contact phone numbers for assistance for urgent and routine care needs. Service will only be billed when office clinical staff spend 20 minutes or more in a month to coordinate care. Only one practitioner may furnish and bill the service in a calendar month. The patient may stop CCM services at any time (effective at the end of the month) by phone call to the office staff. The patient will be responsible for cost sharing (co-pay) of up to 20% of the service fee (after annual deductible is met).  Patient agreed to services and verbal consent obtained.   Follow up plan: Telephone appointment with care management team member scheduled for:09/26/20  [facility]  Direct Dial: [phone]

## 2020-09-07 ENCOUNTER — Other Ambulatory Visit: Payer: Medicare Other

## 2020-09-07 ENCOUNTER — Other Ambulatory Visit: Payer: Medicare Other | Admitting: *Deleted

## 2020-09-07 ENCOUNTER — Other Ambulatory Visit: Payer: Self-pay

## 2020-09-07 VITALS — BP 98/58 | HR 96 | Temp 97.9°F | Resp 18

## 2020-09-07 DIAGNOSIS — Z515 Encounter for palliative care: Secondary | ICD-10-CM

## 2020-09-10 NOTE — Progress Notes (Signed)
COMMUNITY PALLIATIVE CARE SW NOTE  PATIENT NAME: [name] DOB: [birth year] MRN: [id number]  PRIMARY CARE PROVIDER: [name], [name], MD  RESPONSIBLE PARTY:  Acct ID - Guarantor Home Phone Work Phone Relationship Acct Type  [id number] [name], [id number] [phone]  Self P/F     [address], [place], [place] [address]     PLAN OF CARE and INTERVENTIONS:             GOALS OF CARE/ ADVANCE CARE PLANNING:  Goal is for patient to remain in her home with her husband. Patient is a Full Code. SOCIAL/EMOTIONAL/SPIRITUAL ASSESSMENT/ INTERVENTIONS:  SW and RN-M. Nadara Mustard completed a follow-up visit with patient at her home where she was present with her husband. She was wearing her o2. She appears thin in appearance, but healthy. She is on continuous o2, but it has has been running intermittently in the 70's and 80's. He is generally on 3/4L. Patient report that she does feels weak or "wobbly" when her o2 is low. She does not have any coughing. She does have shortness of breath with any exertion where she has to stop and rest. Her appetite varies, but is poor overall. Patient report that she eats because she has to, not because she has an appetite to. Patient report that she does have an eating disorder that impacts her appetite and food intake. She reported weight loss. He is 106 lb which is down from 116 lbs three months ago. Patient reported that she also has a history of depression and battles this regularly. Patient report that she has supportive friends and family. She reports pain to her back and legs related to 3 previous back surgeries. Patient has involuntary shaking in her hands and she has intermittent fluid build-up in her feet and legs. Patient is able to go out and visit with friends, family and outings with her husband. Patient remains open to ongoing support and visits from the palliative care/SW team.  PATIENT/CAREGIVER EDUCATION/ COPING:  Patient appears to be coping well.  Patient has a supportive husband and family. PERSONAL EMERGENCY PLAN:  911 can be activated for emergencies. COMMUNITY RESOURCES COORDINATION/ HEALTH CARE NAVIGATION:  None FINANCIAL/LEGAL CONCERNS/INTERVENTIONS:  None     SOCIAL HX:  Social History   Tobacco Use   Smoking status: Every Day    Packs/day: 0.25    Years: 40.00    Pack years: 10.00    Types: Cigarettes    Start date: 09/06/1972   Smokeless tobacco: Never   Tobacco comments:    Patient has 1-2 cigs a day and trying to quit.   Substance Use Topics   Alcohol use: No    Alcohol/week: 0.0 standard drinks    CODE STATUS: Full Code ADVANCED DIRECTIVES: Yes MOST FORM COMPLETE:  No HOSPICE EDUCATION PROVIDED: No  PPS: Patient is alert and oriented x3. Patient ambulates independently. She is on continuous o2 and have periods of exertion where she has to rest.  Duration of visit and documentation: 60 minutes  Katheren Puller, LCSW

## 2020-09-11 ENCOUNTER — Other Ambulatory Visit: Payer: Self-pay | Admitting: Physical Medicine and Rehabilitation

## 2020-09-12 ENCOUNTER — Ambulatory Visit: Payer: Medicare Other | Admitting: Internal Medicine

## 2020-09-12 ENCOUNTER — Other Ambulatory Visit: Payer: Self-pay

## 2020-09-12 ENCOUNTER — Encounter: Payer: Self-pay | Admitting: Internal Medicine

## 2020-09-12 DIAGNOSIS — J449 Chronic obstructive pulmonary disease, unspecified: Secondary | ICD-10-CM

## 2020-09-12 DIAGNOSIS — J9611 Chronic respiratory failure with hypoxia: Secondary | ICD-10-CM | POA: Diagnosis not present

## 2020-09-12 DIAGNOSIS — F17210 Nicotine dependence, cigarettes, uncomplicated: Secondary | ICD-10-CM | POA: Diagnosis not present

## 2020-09-12 DIAGNOSIS — J9612 Chronic respiratory failure with hypercapnia: Secondary | ICD-10-CM | POA: Diagnosis not present

## 2020-09-12 MED ORDER — NICOTINE 7 MG/24HR TD PT24: 7.0000 mg | MEDICATED_PATCH | Freq: Every day | TRANSDERMAL | 2 refills | Status: DC

## 2020-09-12 MED ORDER — BREZTRI AEROSPHERE 160-9-4.8 MCG/ACT IN AERO: 2.0000 | INHALATION_SPRAY | Freq: Two times a day (BID) | RESPIRATORY_TRACT | 0 refills | Status: DC

## 2020-09-12 NOTE — Patient Instructions (Signed)
Plan A = Automatic = Always=    Breztri Take 2 puffs first thing in am and then another 2 puffs about 12 hours later.    Work on inhaler technique:  relax and gently blow all the way out then take a nice smooth full deep breath back in, triggering the inhaler at same time you start breathing in.  Hold for up to 5 seconds if you can. Blow out thru nose. Rinse and gargle with water when done.  If mouth or throat bother you at all,  try brushing teeth/gums/tongue with arm and hammer toothpaste/ make a slurry and gargle and spit out.       Plan B = Backup (to supplement plan A, not to replace it) Only use your albuterol inhaler(PROAIR)  as a rescue medication to be used if you can't catch your breath by resting or doing a relaxed purse lip breathing pattern.  - The less you use it, the better it will work when you need it. - Ok to use the inhaler up to 2 puffs  every 4 hours if you must but call for appointment if use goes up over your usual need - Don't leave home without it !!  (think of it like the spare tire for your car)     Make sure you check your oxygen saturation  at your highest level of activity  to be sure it stays over 90% and adjust  02 flow upward to maintain this level if needed but remember to turn it back to previous settings when you stop (to conserve your supply).   Please schedule a follow up visit in 3 months but call sooner if needed

## 2020-09-12 NOTE — Assessment & Plan Note (Signed)
D/c from [facility] 08/27/18 with HC03 30 on 3lpm  12/08/2018  Patient Saturations on Room Air at Rest = 94%, while Ambulating = 87% Patient Saturations on 3  Liters of pulsed oxygen while Ambulating = 95% -  12/06/2019   Required 4lpm pulsed to walk 250 ft moderate pace with sats 92% at end and denied sob but desaturated walking on 3lpm pulsed.   Adequate control on present rx, reviewed in detail with pt > no change in rx needed    Advised again: Make sure you check your oxygen saturation  at your highest level of activity  to be sure it stays over 90% and adjust  02 flow upward to maintain this level if needed but remember to turn it back to previous settings when you stop (to conserve your supply).

## 2020-09-12 NOTE — Assessment & Plan Note (Signed)
Counseled re importance of smoking cessation but did not meet time criteria for separate billing            Each maintenance medication was reviewed in detail including emphasizing most importantly the difference between maintenance and prns and under what circumstances the prns are to be triggered using an action plan format where appropriate.  Total time for H and P, chart review, counseling, reviewing hfa device(s) and generating customized AVS unique to this office visit / same day charting  > 30 min       

## 2020-09-12 NOTE — Progress Notes (Signed)
[name], female    DOB: [birth year]      MRN: [id number]   Brief patient profile:  65 yowf MM/active smoker with onset somewhat limited by both breathing and back/ legs x 2017  With GOLD II criteria by spirometry  07/17/15   rx with proair avg sev times a week at most no maint rx then acutely ill for a few days weakness > sob  before admitted [facility]  With new acute resp failure/ severe hypoxemia.     History of Present Illness  12/08/2018  Pulmonary/ 1st office eval/[name]  GOLD II/ still smoking on symb 22 'now and then"  And 02 24/7  Chief Complaint  Patient presents with   Pulmonary Consult    Referred by Dr Deniece Ree. Pt c/o SOB since July 2020. She states she was hospitalized with CHF then and started on o2 3lpm continuous o2.   Dyspnea:  Better  But can't walk the dog yet, better on 02 3lpm cont Cough: none  Sleep: no resp symptoms  flat  SABA use: less than usual / nebulizer twice weekly at most  0 2 3lpm 24/7 does not titrate though has pulse ox rec Plan A = Automatic = Always=   Symbicort 160 Take 2 puffs first thing in am and then another 2 puffs about 12 hours later.   Work on inhaler technique:     Plan B = Backup (to supplement plan A, not to replace it) Only use your albuterol inhaler as a rescue medication to be used if you can't catch your breath    Plan C = Crisis (instead of Plan B but only if Plan B stops working) - only use your albuterol nebulizer if you first try Plan B and it fails to help > ok to use the nebulizer up to every 4 hours but if start needing it regularly call for immediate appointment  Goal is to keep your 02 saturation above 90%   The key is to stop smoking completely before smoking completely stops you!   07/21/2019  f/u ov/[name] re: GOLD II/ still smoking but cutting down/ unable to use hfa correctly  / had vaccines for COVID 38  Chief Complaint  Patient presents with   Follow-up    Breathing has been "off and on decent" c/o feeling light  headed.    Dyspnea:  Walking dog around the outside of the house Cough: "it's from my drainage from allergies better with nasal spray/ mucoid  Sleeping: flat bed 2 pillows  SABA use:  Used saba am of ov neb but not the symbicort yet  02: 3lpm continuous rec Plan A = Automatic = Always=    Trelegy one click first thing each am - take two good deep drags and hold for at least 5 5 seconds then out thru the nose Plan B = Backup (to supplement plan A, not to replace it) Only use your albuterol PROAIR/red inhaler as a rescue medication  Plan C = Crisis (instead of Plan B but only if Plan B stops working) - only use your albuterol nebulizer if you first try Plan B and it fails to help > ok to use the nebulizer up to every 4 hours but if start needing it regularly call for immediate appointment Will ask adapt to do a best fit evaluation for your portable system. Make sure you check your oxygen saturations at highest level of activity to be sure it stays over 90%.  Please schedule a  follow up office visit in 6 weeks, call sooner if needed - bring all your inhalers and nebulizer solutions.   09/01/2019  f/u ov/[name] re:  GOLD III copd / trelegy maint  Chief Complaint  Patient presents with   Follow-up    shortness of breath with exertion  Dyspnea:  Struggles to walk dog 100 ft on 3lpm pulsed but not checking sats  Cough: no  Sleeping: bed is flat 1-2 pillows  SABA use: much less now  02: 3 hs and up to 3 lpm walking  rec We will see if we can qualify you for POC thru [facility]  Make sure you check your oxygen saturations at highest level of activity to be sure it stays over 90%  The key is to stop smoking completely before smoking completely stops you!   12/06/2019  f/u ov/[name] re:  trelegy  GOLD III copd/ 02 dep  Chief Complaint  Patient presents with   Follow-up    nasal congestion   Dyspnea:  750 feet to get mail  Cough: minimal am mucoid Sleeping one pillow bed flat  SABA  use: much less  02: 2lpm sitting  Usually 90s/ 3lpm at hs / not walking on 02 though  Rec The key is to stop smoking completely before smoking completely stops you! Make sure you check your oxygen saturations at highest level of activity     09/12/2020  f/u ov/[place] office/[name] re: GOLD III copd/ 02 dep  maint on breztri but not consistent  with rx  Chief Complaint  Patient presents with   Follow-up    Breathing is overall doing well. She states she is using her proair inhaler about 2 x per wk.   Dyspnea:  has to stop several times to mb and back some hills  x 750 ft  Cough: none  Sleeping: bed is flat 2pillows  SABA use: 2 x weekly  02:  4lpm / no am ha  Covid status: vax x 4  Lung cancer screening: n/a    No obvious day to day or daytime variability or assoc excess/ purulent sputum or mucus plugs or hemoptysis or cp or chest tightness, subjective wheeze or overt sinus or hb symptoms.   Sleeping   without nocturnal  or early am exacerbation  of respiratory  c/o's or need for noct saba. Also denies any obvious fluctuation of symptoms with weather or environmental changes or other aggravating or alleviating factors except as outlined above   No unusual exposure hx or h/o childhood pna/ asthma or knowledge of premature birth.  Current Allergies, Complete Past Medical History, Past Surgical History, Family History, and Social History were reviewed in [employer] record.  ROS  The following are not active complaints unless bolded Hoarseness, sore throat, dysphagia, dental problems, itching, sneezing,  nasal congestion or discharge of excess mucus or purulent secretions, ear ache,   fever, chills, sweats, unintended wt loss or wt gain, classically pleuritic or exertional cp,  orthopnea pnd or arm/hand swelling  or leg swelling, presyncope, palpitations, abdominal pain, anorexia, nausea, vomiting, diarrhea  or change in bowel habits or change in bladder habits,  change in stools or change in urine, dysuria, hematuria,  rash, arthralgias, visual complaints, headache, numbness, weakness or ataxia or problems with walking or coordination,  change in mood or  memory.        Current Meds  Medication Sig   albuterol (PROAIR HFA) 108 (90 Base) MCG/ACT inhaler Inhale 2 puffs into  the lungs every 4 (four) hours as needed for wheezing or shortness of breath (cough).   ALPRAZolam (XANAX) 0.5 MG tablet TAKE 1 TABLET BY MOUTH TWICE DAILY AS NEEDED FOR ANXIETY.   aspirin EC 81 MG tablet Take 81 mg by mouth daily.   DULoxetine (CYMBALTA) 60 MG capsule Take 1 capsule (60 mg total) by mouth 2 (two) times daily.   fluticasone (FLONASE) 50 MCG/ACT nasal spray Place 1-2 sprays into both nostrils daily as needed for allergies.    levothyroxine (SYNTHROID) 50 MCG tablet Take 1 tablet (50 mcg total) by mouth daily before breakfast.   midodrine (PROAMATINE) 5 MG tablet Take 1 tablet (5 mg total) by mouth 3 (three) times daily with meals.   mirtazapine (REMERON) 15 MG tablet TAKE (1) TABLET BY MOUTH AT BEDTIME.   nicotine (NICODERM CQ - DOSED IN MG/24 HR) 7 mg/24hr patch Place 1 patch (7 mg total) onto the skin daily.   OVER THE COUNTER MEDICATION Take 1 capsule by mouth daily. Amberen Multi-Symptom Menopause Relief   oxyCODONE (OXY IR/ROXICODONE) 5 MG immediate release tablet TAKE (1) TABLET BY MOUTH EVERY SIX HOURS AS NEEDED FOR SEVERE PAIN.   pantoprazole (PROTONIX) 40 MG tablet TAKE ONE TABLET BY MOUTH TWICE DAILY BEFORE A MEAL.   pregabalin (LYRICA) 50 MG capsule Take 1 capsule (50 mg total) by mouth 3 (three) times daily. Actually 1 in Am and 2 at night- for nerve pain   QUEtiapine (SEROQUEL) 50 MG tablet Take 1-2 tablets (50-100 mg total) by mouth at bedtime.   torsemide (DEMADEX) 20 MG tablet Take 1 tablet (20 mg total) by mouth 2 (two) times daily.   UNABLE TO FIND Med Name: OTC Amberen (for hot flashes)   [DISCONTINUED] nicotine (NICODERM CQ - DOSED IN MG/24 HOURS) 21  mg/24hr patch Place 1 patch (21 mg total) onto the skin daily.                 Past Medical History:  Diagnosis Date   Anemia    Anxiety and depression    Bronchitis    Cancer ([place])    uterus   CHF (congestive heart failure) (HCC)    Chronic pain 11/22/2010   COPD (chronic obstructive pulmonary disease) (HCC)    Coronary artery disease    Gastroesophageal reflux disease 11/22/2010   Hyperlipidemia    Hypertension    Hypothyroid    Migraine headache 11/22/2010   Near syncope    On home oxygen therapy    started 08/2018   Peripheral vascular disease ([place])    Pneumonia    Tobacco abuse    1/2 pack per day         Objective:      09/12/2020         110 12/06/2019       124   09/01/2019      124 07/21/19 124 lb (56.2 kg)  07/12/19 127 lb (57.6 kg)  06/11/19 127 lb (57.6 kg)    Vital signs reviewed  09/12/2020  - Note at rest 02 sats  92% on 4pm    General appearance:    chronically ill elderly wm nad    Reports: Full dentures  HEENT : pt wearing mask not removed for exam due to covid -19 concerns.    NECK :  without JVD/Nodes/TM/ nl carotid upstrokes bilaterally   LUNGS: no acc muscle use,  Mod barrel  contour chest wall with bilateral  Distant bs s audible wheeze and  without cough on insp or exp maneuvers and mod  Hyperresonant  to  percussion bilaterally     CV:  RRR  no s3 or murmur or increase in P2, and no edema   ABD:  soft and nontender with pos mid insp Hoover's  in the supine position. No bruits or organomegaly appreciated, bowel sounds nl  MS:     ext warm without deformities, calf tenderness, cyanosis or clubbing No obvious joint restrictions   SKIN: warm and dry without lesions    NEURO:  alert, approp, nl sensorium with  no motor or cerebellar deficits apparent.          I personally reviewed images and agree with radiology impression as follows:  CXR:   07/27/20  Background of chronic interstitial coarsening. No focal consolidation,  pleural effusion pneumothorax. Stable cardiomegaly. Atherosclerotic calcification of the aorta. No osseous pathology. Old left rib fractures.          Assessment

## 2020-09-12 NOTE — Assessment & Plan Note (Signed)
Active smoker - Spirometry 07/17/15   FEV1 1.28 (53%)  Ratio 0.58 classic concave contour to f/v loop  - 12/08/2018   try increase symb to 80 2bid   -  Alpha one AT screen 12/08/2018  MM   Level 159  - 07/21/2019  After extensive coaching inhaler device,  effectiveness =    0% vs 75% with elipta so try change to trelegy if insurance covers - PFT's  08/23/20   FEV1 0.54 (23 % ) ratio 0.48  p 0 % improvement from saba p saba > 4 h prior to study with DLCO  6.84 (36%) corrects to 3.47 (80 %)  for alv volume and FV curve classic concave   - 09/12/2020  After extensive coaching inhaler device,  effectiveness =    50% (short Ti) > continue breztri and work on technique   Group D in terms of symptom/risk and laba/lama/ICS  therefore appropriate rx at this point >>>  Continue breztri and approp saba

## 2020-09-12 NOTE — Progress Notes (Signed)
[facility]UNITY PALLIATIVE CARE RN NOTE  PATIENT NAME: [name] DOB: Oct 02, 1955 MRN: [id number]  PRIMARY CARE PROVIDER: [name], [name], MD  RESPONSIBLE PARTY:  Acct ID - Guarantor Home Phone Work Phone Relationship Acct Type  [id number] AIYANNA, [phone]  Self P/F     [place], [place], [place] [address]   Covid-19 Pre-screening Negative  PLAN OF CARE and INTERVENTION:  ADVANCE CARE PLANNING/GOALS OF CARE: Goal is for patient to remain as active as possible and avoid hospitalizations. She is a Full code. PATIENT/CAREGIVER EDUCATION: Symptom management, safe mobility, energy conservation, CHF and dyspnea management DISEASE STATUS: Joint palliative care follow-up visit completed with LCSW, M. Lonon. Met with patient and her husband in their home. Patient is alert and oriented x 4 and engages in appropriate conversation. She experiences chronic pain in her lower back, which radiates down her legs which is well controlled with Oxycodone. She does experience some shortness of breath at times with exertion. She is on continuous oxygen at 3-4L via nasal cannula to keep her sats above 90%. She takes Xanax for anxiety which is effective. She remains ambulatory and independent with ADLs. She does tire easily and requires rest periods. She continues to travel outside of the home for socialization. She had a recent incident where she went to her hair stylist and did not realize that her oxygen was not on. Husband believes she went without oxygen for about 8 hours. He went to pick up patient and she was confused and it took her a day or so to recover from this occurrence. Patient does not remember this happening. She does have portable oxygen tanks. She says it is difficult for her to tell when her oxygen has dropped. Husband states her gait becomes more unsteady and she can't remember things. She is feeling lightheaded and slightly dizzy during visit today when  standing. Her blood pressure is 98/58 manually, which husband states is not unusual for her. She is taking Midodrine 5 mg three times daily to help raise her blood pressure. Her intake is variable, but mainly poor. She says she has an eating disorder and it is difficult for her to eat when she is not hungry. She went out to eat last night for her birthday and reports eating very well, but this is not typical. She may eat 1-2 small meals/day. She has not had anything to eat today as of yet. She has worked with Dietician's in the past. Current weight today is 106 lbs. She weighs daily. Her goal is to get back up to 115 lbs. She does have trace pedal edema, but takes Torsemide twice daily. She feels that her current medication regimen manages her symptoms well. Will continue to monitor.   HISTORY OF PRESENT ILLNESS:  This is a 65 yo female with a history of CHF, COPD, HTN, cerebrovascular disease, hyperlipidemia, GERD and hypothyroidism. Palliative care team continues to follow patient for additional support, goals of care and complex decision making.  CODE STATUS: Full code  ADVANCED DIRECTIVES: Y MOST FORM: no PPS: 60%   PHYSICAL EXAM:   VITALS: Today's Vitals   09/07/20 1405  BP: (!) 98/58  Pulse: 96  Resp: 18  Temp: 97.9 F (36.6 C)  TempSrc: Temporal  SpO2: 95%  PainSc: 2   PainLoc: Back    LUNGS: clear to auscultation  CARDIAC: Cor RRR EXTREMITIES: Trace bilateral pedal edema SKIN:  Exposed skin is dry and intact   NEURO:  Alert and oriented  x 4, pleasant mood, independent with ADLs, ambulatory   (Duration of visit and documentation 75 minutes)   Daryl Eastern, RN BSN

## 2020-09-16 ENCOUNTER — Other Ambulatory Visit: Payer: Self-pay | Admitting: Gastroenterology

## 2020-09-18 ENCOUNTER — Telehealth: Payer: Self-pay | Admitting: Pharmacist

## 2020-09-18 NOTE — Chronic Care Management (AMB) (Signed)
Chronic Care Management Pharmacy Assistant   Name: [name]  MRN: [id number] DOB: [birth year]  [name] is an 65 y.o. year old female who presents for herinitial CCM visit with the clinical pharmacist.  Reason for Encounter: Chart Prep for initial visit with Jeni Salles the clinical pharmacist on 09/22/20.   Conditions to be addressed/monitored: CHF, HTN, HLD, COPD, Anxiety, Depression, and GERD, Migraines, cerebrovascular disease, adrenal insufficiency, hypothyroidism, hypokalemia and anemia.  Primary concerns for visit include:   Recent office visits:  08/22/20 Lelon Frohlich MD (PCP) - patient seen for hospital discharge follow up and other chronic conditions. Referrals for community care coordination, home health and cardiology placed. No medication changes. Follow up with cardiology.  Recent consult visits:  09/12/20 Christinia Gully MD (Pulmonary Disease) - presented to clinic for COPD, hypercapnia and cigarette smoker. Discontinued albuterol Sulfate 2.5 nebulization every 4 hours as needed and changed to albuterol sulfate inhaler 108 (90 base) mcg 2 puffs inhalation every 4 hours as needed. Decreased nicotine patch from '21mg'$  daily to '7mg'$  daily. Discontinued mometasone - formoterol fumate 200-36mg. Follow up in 3 months.  09/07/20 Monishia Howard RN ([facility] - patient presented to clinic for palliative care encounter. No medication changes. No follow up noted.   08/09/20 SEric FormNP (Pulmonary Disease) - patient presented to clinic for acute on chronic systolic heart failure and other issues. Patient started on budesonide- glycopyrrol - formoterol 160-9-4.8 mcg 2 puffs two times daily. Follow up in 3 weeks with Dr. WMelvyn Novas   07/26/20 MCourtney HeysMD (CMonrovia - patient presented to clinic for COPD rehab. No medication changes. No follow up noted.   04/07/20 MCourtney HeysMD ([facility] - patient presented to clinic for COPD rehab and other chronic conditions. Patient started on levothyroxine 58m daily. Changed lyrica to '50mg'$  1 tablet in the am and 2 tablets at night from three times daily. Discontinued fluticasone 100 - 62.5-25 mcg. Follow up in 3 months.   Hospital visits:  Medication Reconciliation was completed by comparing discharge summary, patient's EMR and Pharmacy list, and upon discussion with patient.  Admitted to the hospital on 07/26/20 due to Acute on chronic respiratory failure with Hypoxia and Hypercapnia. Discharge date was 08/01/20. [facility] -started empagliflozin, mometasone- formoterol, nicotine and torsemide.  Medication Changes at Hospital Discharge: -Changed mirtazipine, oxycodone, pantoprazole and pregabalin.   Medications Discontinued at Hospital Discharge: -Stopped None.   Medications that remain the same after Hospital Discharge:??  -All other medications will remain the same.    Medications: Outpatient Encounter Medications as of 09/18/2020  Medication Sig Note   albuterol (PROAIR HFA) 108 (90 Base) MCG/ACT inhaler Inhale 2 puffs into the lungs every 4 (four) hours as needed for wheezing or shortness of breath (cough).    ALPRAZolam (XANAX) 0.5 MG tablet TAKE 1 TABLET BY MOUTH TWICE DAILY AS NEEDED FOR ANXIETY.    aspirin EC 81 MG tablet Take 81 mg by mouth daily.    Budeson-Glycopyrrol-Formoterol (BREZTRI AEROSPHERE) 160-9-4.8 MCG/ACT AERO Inhale 2 puffs into the lungs in the morning and at bedtime.    DULoxetine (CYMBALTA) 60 MG capsule Take 1 capsule (60 mg total) by mouth 2 (two) times daily. [facility]   empagliflozin (JARDIANCE) 10 MG TABS tablet Take 1 tablet (10 mg total) by mouth daily. (Patient  not taking: Reported on 09/12/2020)    fluticasone (FLONASE) 50 MCG/ACT nasal spray Place  1-2 sprays into both nostrils daily as needed for allergies.     levothyroxine (SYNTHROID) 50 MCG tablet Take 1 tablet (50 mcg total) by mouth daily before breakfast. 07/26/2020: LF at [facility] on 06/24/20. 30 Day Supply    midodrine (PROAMATINE) 5 MG tablet Take 1 tablet (5 mg total) by mouth 3 (three) times daily with meals. 07/26/2020: LF at [facility] on 06/24/20. 30 Day Supply    mirtazapine (REMERON) 15 MG tablet TAKE (1) TABLET BY MOUTH AT BEDTIME. 07/26/2020: LF at [facility] on 06/24/20. 30 Day Supply    nicotine (NICODERM CQ - DOSED IN MG/24 HR) 7 mg/24hr patch Place 1 patch (7 mg total) onto the skin daily.    OVER THE COUNTER MEDICATION Take 1 capsule by mouth daily. Amberen Multi-Symptom Menopause Relief    oxyCODONE (OXY IR/ROXICODONE) 5 MG immediate release tablet TAKE (1) TABLET BY MOUTH EVERY SIX HOURS AS NEEDED FOR SEVERE PAIN.    pantoprazole (PROTONIX) 40 MG tablet TAKE ONE TABLET BY MOUTH TWICE DAILY BEFORE A MEAL. 07/26/2020: LF at [facility] on 04/28/20. 90 Day Supply    pregabalin (LYRICA) 50 MG capsule Take 1 capsule (50 mg total) by mouth 3 (three) times daily. Actually 1 in Am and 2 at night- for nerve pain 07/26/2020: LF at [facility] on 06/24/20. 30 Day Supply    QUEtiapine (SEROQUEL) 50 MG tablet Take 1-2 tablets (50-100 mg total) by mouth at bedtime. 07/26/2020: LF at [facility] on 06/24/20. 30 Day Supply    torsemide (DEMADEX) 20 MG tablet Take 1 tablet (20 mg total) by mouth 2 (two) times daily.    UNABLE TO FIND Med Name: OTC Amberen (for hot flashes)    No facility-administered encounter medications on file as of 09/18/2020.   Fill History: ALPRAZOLAM 0.5 MG 09/11/2020 30   DULOXETINE HCL DR 09/16/2020 30   JARDIANCE 10 MG TABLET 08/01/2020 30   LEVOTHYROXINE 50 MCG TABLET 09/03/2020 30   MIDODRINE HCL 5 MG 09/16/2020 30   MIRTAZAPINE '15MG'$  T 09/16/2020 30   DULERA 200 MCG-5 MCG INHALER 08/01/2020 30    PANTOPRAZOLE DR 40 09/16/2020 90   PREGABALIN 50 MG C 09/05/2020 30   QUETIAPINE FUMARAT 06/24/2020 30   TORSEMIDE 20 MG TABLET 08/01/2020 30   OXYCODONE '5MG'$  TABS 09/05/2020 30   Initial Questions: Have you seen any other providers since your last visit?   Any changes in your medications or health?   Any side effects from any medications?   Do you have an symptoms or problems not managed by your medications?   Any concerns about your health right now?   Has your provider asked that you check blood pressure, blood sugar, or follow special diet at home?   Do you get any type of exercise on a regular basis?   Can you think of a goal you would like to reach for your health?   Do you have any problems getting your medications?   Is there anything that you would like to discuss during the appointment?   Please bring medications and supplements to appointment.  Care Gaps:  AWV - message sent to Ramond Craver CMA to schedule. Tetanus/TDAP - never done  Zoster vaccines - never done Pap smear - never done Pneumonia vaccine - 02/21/13 Covid - 19 vaccine booster - 06/19/19 Flu vaccine - due  Notes: Spoke with Eustaquio Maize at [employer]  and she verified patients last fill date for Jardiance as below. Patient does have a fill history gap. Patient stated she needed to reschedule her appointment due to not being able to travel from home in [place] with her oxygen and everything she needs in a timely manner to make a 9:30am appointment. Patient stated she prefers to have a later afternoon appointment. Patient was agreeable to reschedule to 11/14/20 at 2:45pm.   Star Rating Drugs:  Empagliflozin '10mg'$  - last filled on 08/01/20 30DS at [place] Pharmacist Assistant [phone]

## 2020-09-19 NOTE — Telephone Encounter (Signed)
Needs office visit.

## 2020-09-20 ENCOUNTER — Encounter: Payer: Self-pay | Admitting: Gastroenterology

## 2020-09-22 ENCOUNTER — Ambulatory Visit: Payer: Medicare Other

## 2020-09-24 NOTE — Progress Notes (Signed)
Cardiology Office Note  Date: 09/25/2020   ID: Gazelle, Marlo 03/19/1955, MRN [id number]  PCP:  [name], [name], MD  Cardiologist:  Rozann Lesches, MD Electrophysiologist:  None   Chief Complaint: 1 month follow-up   History of Present Illness: Tallis Durban is a 65 y.o. female with a history of HTN, HLD, hypothyroidism, COPD, PVD, tobacco abuse, near syncope, uterine cancer, on home O2, carotid artery disease, left subclavian steal,  She was last seen by Dr. Domenic Polite on 03/01/2020 for orthostatic hypotension and bilateral carotid artery disease.  She stated intermittently her blood pressure was in the 90s.  She described orthostatic dizziness at times but no syncope.  She had been on midodrine but ran out recently and needed a refill.  History of chronic lower back and sciatic pain with previous lumbar surgeries.  He mentions Seroquel, oxycodone and Cymbalta could also cause orthostasis and that it was very likely she had a component of autonomic dysfunction in setting of treatment of chronic pain.  Her orthostatic measurements on that visit were normal.  She was currently not on statin therapy and was unable to recall whether she was statin intolerant.  Plan was to continue midodrine 5 mg 3 times daily and to improve her hydration.  Plan was to schedule follow-up carotid Doppler studies.  He recommended she follow-up with PCP for lipid panel and could consider initiation of Crestor.  History of normal coronary arteries at cardiac catheterization in 2015     She was recently hospitalized from 07/26/2020 to 08/01/2020 for acute on chronic hypoxemic respiratory failure due to COPD with acute exacerbation, acute on chronic combined heart failure.  She also developed renal failure which had improved.  She had follow-up with her primary care provider on 08/22/2020 and mentioned she had fallen approximately 4 times since her hospital discharge.  She mentioned having PFTs scheduled  for later on in the month.  In the office her O2 sats were 84 to 86% on 3 L.  O2 sats increased to 90 to 91% at her after increasing her oxygen to 4 L.  She was having complaints of feeling like she might blackout right before her falls.  She stated sometimes she just felt dizzy.  She was not sure she was taking midodrine.  She was continuing to smoke.  Her PCP ordered a CBC and CMP.  She is here status post hospital follow-up.  She states she is feeling much better.  She was started on a new inhaler breast tree which she states has helped her significantly.  She states she has stopped smoking and is on step 2 of her smoking cessation.  She states her husband has taken up smoking now and she is upset with him.  She is on continuous O2 today.  Blood pressure 128/64.  O2 sat on nasal O2 is 97%.  Today's weight is 109.  States she believes she is on too much fluid medication.  She states she has been urinating very frequently and is concerned she may be dehydrating herself.  We discussed heart failure restrictions including sodium restriction, fluid restriction and weighing every day.  She states this was discussed prior to discharge from recent hospital stay.  She states she ran out of her Vania Rea and needs a refill.  She is using nicotine patches to help with smoking cessation.  Currently denies any respiratory distress.  She denies any anginal symptoms, palpitations or arrhythmias, orthostatic symptoms, CVA or TIA-like symptoms.  Denies any claudication-like symptoms, DVT or PE-like symptoms.   Past Medical History:  Diagnosis Date   Anemia    Anxiety and depression    Bronchitis    Chronic pain    COPD (chronic obstructive pulmonary disease) (HCC)    Gastroesophageal reflux disease    History of cardiac catheterization    Normal coronary arteries 2015   Hyperlipidemia    Hypertension    Hypothyroid    Migraine headache    Near syncope    On home oxygen therapy    Started 08/2018   Peripheral  vascular disease ([place])    Pneumonia    Tobacco abuse    1/2 pack per day   Uterine cancer [facility])     Past Surgical History:  Procedure Laterality Date   ABDOMINAL HYSTERECTOMY     without oophorectomy for neoplastic disease   BACK SURGERY     BIOPSY  10/22/2018   Procedure: BIOPSY;  Surgeon: Danie Binder, MD;  [facility];  Service: Endoscopy;;  duodenal, gastric   CARDIAC CATHETERIZATION  07/2013   normal coronary arteries   CARPAL TUNNEL RELEASE     Left   CHOLECYSTECTOMY  2007   DILATION AND CURETTAGE OF UTERUS     ESOPHAGOGASTRODUODENOSCOPY (EGD) WITH PROPOFOL N/A 10/22/2018   gastritis (mild reactive gastropathy, negative duodenal biopsies for celiac).    LAPAROTOMY     LEFT HEART CATHETERIZATION WITH CORONARY ANGIOGRAM N/A 07/12/2013   Procedure: LEFT HEART CATHETERIZATION WITH CORONARY ANGIOGRAM;  Surgeon: Leonie Man, MD;  Location: [facility] CATH LAB;  Service: Cardiovascular;  Laterality: N/A;   LUMBAR LAMINECTOMY  x3   Left; complicated by neurologic dysfunction   NASAL SINUS SURGERY      Current Outpatient Medications  Medication Sig Dispense Refill   albuterol (PROAIR HFA) 108 (90 Base) MCG/ACT inhaler Inhale 2 puffs into the lungs every 4 (four) hours as needed for wheezing or shortness of breath (cough). 18 g 11   ALPRAZolam (XANAX) 0.5 MG tablet TAKE 1 TABLET BY MOUTH TWICE DAILY AS NEEDED FOR ANXIETY. 60 tablet 5   aspirin EC 81 MG tablet Take 81 mg by mouth daily.     Budeson-Glycopyrrol-Formoterol (BREZTRI AEROSPHERE) 160-9-4.8 MCG/ACT AERO Inhale 2 puffs into the lungs in the morning and at bedtime. 5.9 g 0   DULoxetine (CYMBALTA) 60 MG capsule Take 1 capsule (60 mg total) by mouth 2 (two) times daily. 60 capsule 5   empagliflozin (JARDIANCE) 10 MG TABS tablet Take 1 tablet (10 mg total) by mouth daily. 30 tablet 0   levothyroxine (SYNTHROID) 50 MCG tablet Take 1 tablet (50 mcg total) by mouth daily before breakfast. 30 tablet 5   midodrine  (PROAMATINE) 5 MG tablet Take 1 tablet (5 mg total) by mouth 3 (three) times daily with meals. 90 tablet 5   mirtazapine (REMERON) 15 MG tablet TAKE (1) TABLET BY MOUTH AT BEDTIME. 30 tablet 5   nicotine (NICODERM CQ - DOSED IN MG/24 HR) 7 mg/24hr patch Place 1 patch (7 mg total) onto the skin daily. 28 patch 2   OVER THE COUNTER MEDICATION Take 1 capsule by mouth daily. Amberen Multi-Symptom Menopause Relief     oxyCODONE (OXY IR/ROXICODONE) 5 MG immediate release tablet TAKE (1) TABLET BY MOUTH EVERY SIX HOURS AS NEEDED FOR SEVERE PAIN. 120 tablet 0   pantoprazole (PROTONIX) 40 MG tablet TAKE ONE TABLET BY MOUTH TWICE DAILY BEFORE A MEAL. 180 tablet 0   pregabalin (LYRICA) 50 MG capsule  Take 1 capsule (50 mg total) by mouth 3 (three) times daily. Actually 1 in Am and 2 at night- for nerve pain 90 capsule 5   QUEtiapine (SEROQUEL) 50 MG tablet Take 1-2 tablets (50-100 mg total) by mouth at bedtime. 60 tablet 3   torsemide (DEMADEX) 20 MG tablet Take 1 tablet (20 mg total) by mouth 2 (two) times daily. 60 tablet 0   UNABLE TO FIND Med Name: OTC Amberen (for hot flashes)     No current facility-administered medications for this visit.   Allergies:  Mirtazapine, Acetaminophen-codeine, Aspirin, Astelin [azelastine], Bactrim [sulfamethoxazole-trimethoprim], Celecoxib, Ciprofloxacin, Fentanyl, Levaquin [levofloxacin in d5w], Lipitor [atorvastatin], Neomycin, Salicylates, Sulfa antibiotics, Adhesive [tape], Latex, and Tegretol [carbamazepine]   Social History: The patient  reports that she has been smoking cigarettes. She started smoking about 48 years ago. She has a 10.00 pack-year smoking history. She has never used smokeless tobacco. She reports that she does not drink alcohol and does not use drugs.   Family History: The patient's family history includes Bleeding Disorder in her mother; Colon polyps in her mother; Deep vein thrombosis in her mother; Heart disease in her brother and mother;  Hypertension in her brother, mother, and sister.   ROS:  Please see the history of present illness. Otherwise, complete review of systems is positive for none.  All other systems are reviewed and negative.   Physical Exam: VS:  BP 128/64   Pulse 89   Ht '5\' 2"'$  (1.575 m)   Wt 109 lb 6.4 oz (49.6 kg)   SpO2 97%   BMI 20.01 kg/m , BMI Body mass index is 20.01 kg/m.  Wt Readings from Last 3 Encounters:  09/25/20 109 lb 6.4 oz (49.6 kg)  09/12/20 110 lb (49.9 kg)  08/22/20 107 lb 4.8 oz (48.7 kg)    General: Patient appears comfortable at rest. Neck: Supple, no elevated JVP or carotid bruits, no thyromegaly. Lungs: Slightly diminished throughout with prolonged expiratory phase., nonlabored breathing at rest.  On continuous O2 Cardiac: Regular rate and rhythm, no S3 or significant systolic murmur, no pericardial rub. Extremities: No pitting edema, distal pulses 2+. Skin: Warm and dry. Musculoskeletal: No kyphosis. Neuropsychiatric: Alert and oriented x3, affect grossly appropriate.  ECG:  EKG July 27, 2020 normal sinus rhythm with sinus arrhythmia, LBBB, heart rate of 82.  Recent Labwork: 07/26/2020: B Natriuretic Peptide 3,606.8 07/28/2020: Magnesium 1.4; TSH 0.336 08/22/2020: ALT 9; AST 19; BUN 24; Creatinine, Ser 1.56; Hemoglobin 13.0; Platelets 742.0 Repeated and verified X2.; Potassium 4.6; Sodium 138     Component Value Date/Time   CHOL 129 07/28/2020 0125   TRIG 108 07/28/2020 0125   HDL 28 (L) 07/28/2020 0125   CHOLHDL 4.6 07/28/2020 0125   VLDL 22 07/28/2020 0125   LDLCALC 79 07/28/2020 0125    Other Studies Reviewed Today:  PFTS 08/23/2020 Pulmonary Function Diagnosis: Severe Obstructive Airways Disease Severe Restriction -Parenchymal Severe Diffusion Defect    Echocardiogram 07/27/2020  1. Left ventricular ejection fraction, by estimation, is 30 to 35%. The left ventricle has moderately decreased function. The left ventricle demonstrates global hypokinesis. The  left ventricular internal cavity size was mildly dilated. There is moderate left ventricular hypertrophy. Left ventricular diastolic parameters are consistent with Grade I diastolic dysfunction (impaired relaxation). Elevated left atrial pressure. 2. Right ventricular systolic function is normal. The right ventricular size is normal. 3. Left atrial size was mildly dilated. 4. The mitral valve is normal in structure. No evidence of mitral valve regurgitation. No evidence  of mitral stenosis. 5. The aortic valve has an indeterminant number of cusps. Aortic valve regurgitation is not visualized. No aortic stenosis is present. 6. The inferior vena cava is normal in size with greater than 50% respiratory variability, suggesting right atrial pressure of 3 mmHg.  Carotid artery duplex 03/10/2020 IMPRESSION: No significant stenosis of internal carotid arteries.    Echocardiogram 08/25/2018:  1. The left ventricle has low normal systolic function, with an ejection  fraction of 50-55%. The cavity size was normal. There is moderately  increased left ventricular wall thickness. Left ventricular diastolic  Doppler parameters are consistent with  impaired relaxation. Elevated mean left atrial pressure.   2. The right ventricle has normal systolic function. The cavity was  normal. There is no increase in right ventricular wall thickness.   3. Left atrial size was mildly dilated.   4. Right atrial size was mildly dilated.   5. No evidence of mitral valve stenosis.   6. No stenosis of the aortic valve.   7. The aortic root is normal in size and structure.   8. Pulmonary hypertension is indeterminant, inadequate TR jet.  Assessment and Plan:  1. Chronic combined systolic (congestive) and diastolic (congestive) heart failure ([place])   2. Tobacco abuse   3. Mixed hyperlipidemia   4. Bilateral carotid artery disease, unspecified type ([place])   5. Orthostatic hypotension   6. PAD (peripheral artery  disease) ([place])    1. Chronic combined systolic (congestive) and diastolic (congestive) heart failure (HCC) Recent hospitalization for acute on chronic respiratory failure and acute on chronic systolic and diastolic heart failure.  Recent echocardiogram On 07/27/2020 EF 30 to 35%.  LV global hypokinesis, LV internal cavity size mildly dilated, moderate LVH, G1 DD.  Elevated left atrial pressure.  Previous echocardiogram on 08/25/2018 EF 50 to 55%..  Continue Jardiance 10 mg daily.  Decrease torsemide to 20 mg p.o. daily.   Recent creatinine 1.56 and GFR of 35. Get BMET and magnesium in 2 weeks.  Get a follow-up echocardiogram in October.    2. Tobacco abuse Patient states she has stopped smoking and is on step 2 of smoking cessation.  She has nicotine patches.  Continue nicotine patch 7 milligrams per 24 hours  3. Mixed hyperlipidemia Recent lipid panel on 07/28/2020 total cholesterol 129, triglycerides 108, HDL 28, LDL 79.  Please start Zetia 10 mg daily.  Continue aspirin 81 mg daily.  4. Bilateral carotid artery disease, unspecified type [facility]) Recent follow-up carotid studies  February 2022 showed no significant stenosis of internal carotid arteries.  5. Orthostatic hypotension Blood pressure well controlled today continue midodrine 5 mg p.o. 3 times daily.  6. Decreased cardiac ejection fraction Recent echocardiogram decreased EF on 07/28/2018 to 30 to 35%, LV global kinesis, LV internal cavity size mildly dilated, moderate LVH, G1 DD.  Please get a follow-up echocardiogram at the end of September to reassess LV function, diastolic function and valvular function.  Medication Adjustments/Labs and Tests Ordered: Current medicines are reviewed at length with the patient today.  Concerns regarding medicines are outlined above.   Disposition: Follow-up with Dr. Domenic Polite or APP 2 to 3 months  Signed, Levell July, NP 09/25/2020 4:18 PM    [facility] at [place], [place], [place] [address] Phone: [phone]; Fax: [phone]

## 2020-09-25 ENCOUNTER — Encounter: Payer: Self-pay | Admitting: Family Medicine

## 2020-09-25 ENCOUNTER — Ambulatory Visit: Payer: Medicare Other | Admitting: Family Medicine

## 2020-09-25 VITALS — BP 128/64 | HR 89 | Ht 62.0 in | Wt 109.4 lb

## 2020-09-25 DIAGNOSIS — I951 Orthostatic hypotension: Secondary | ICD-10-CM

## 2020-09-25 DIAGNOSIS — E782 Mixed hyperlipidemia: Secondary | ICD-10-CM

## 2020-09-25 DIAGNOSIS — I779 Disorder of arteries and arterioles, unspecified: Secondary | ICD-10-CM | POA: Diagnosis not present

## 2020-09-25 DIAGNOSIS — I5042 Chronic combined systolic (congestive) and diastolic (congestive) heart failure: Secondary | ICD-10-CM

## 2020-09-25 DIAGNOSIS — Z720 Tobacco use: Secondary | ICD-10-CM

## 2020-09-25 DIAGNOSIS — I739 Peripheral vascular disease, unspecified: Secondary | ICD-10-CM

## 2020-09-25 DIAGNOSIS — R931 Abnormal findings on diagnostic imaging of heart and coronary circulation: Secondary | ICD-10-CM

## 2020-09-25 MED ORDER — EZETIMIBE 10 MG PO TABS: 10.0000 mg | ORAL_TABLET | Freq: Every day | ORAL | 6 refills | Status: DC

## 2020-09-25 MED ORDER — TORSEMIDE 20 MG PO TABS: 20.0000 mg | ORAL_TABLET | Freq: Every day | ORAL | 1 refills | Status: DC

## 2020-09-25 MED ORDER — EMPAGLIFLOZIN 10 MG PO TABS: 10.0000 mg | ORAL_TABLET | Freq: Every day | ORAL | 3 refills | Status: AC

## 2020-09-25 NOTE — Patient Instructions (Addendum)
Medication Instructions:  Decrease Torsemide to '20mg'$  daily.   Begin Zetia '10mg'$  daily.  Continue all other current medications.   Labwork: BMET, Mg - orders given today.  Please do in 2 weeks (around 10/09/2020).    Testing/Procedures: Your physician has requested that you have an echocardiogram. Echocardiography is a painless test that uses sound waves to create images of your heart. It provides your doctor with information about the size and shape of your heart and how well your heart's chambers and valves are working. This procedure takes approximately one hour. There are no restrictions for this procedure - DUE AFTER 11/26/2020  Follow-Up: Office will contact with results via phone or letter.   2-3 months   Any Other Special Instructions Will Be Listed Below (If Applicable).    If you need a refill on your cardiac medications before your next appointment, please call your pharmacy.

## 2020-09-26 ENCOUNTER — Ambulatory Visit (INDEPENDENT_AMBULATORY_CARE_PROVIDER_SITE_OTHER): Payer: Medicare Other

## 2020-09-26 DIAGNOSIS — J449 Chronic obstructive pulmonary disease, unspecified: Secondary | ICD-10-CM

## 2020-09-26 DIAGNOSIS — E782 Mixed hyperlipidemia: Secondary | ICD-10-CM

## 2020-09-26 DIAGNOSIS — I10 Essential (primary) hypertension: Secondary | ICD-10-CM | POA: Diagnosis not present

## 2020-09-26 DIAGNOSIS — F32A Depression, unspecified: Secondary | ICD-10-CM | POA: Diagnosis not present

## 2020-09-26 DIAGNOSIS — I5043 Acute on chronic combined systolic (congestive) and diastolic (congestive) heart failure: Secondary | ICD-10-CM | POA: Diagnosis not present

## 2020-09-26 DIAGNOSIS — G894 Chronic pain syndrome: Secondary | ICD-10-CM

## 2020-09-26 DIAGNOSIS — F419 Anxiety disorder, unspecified: Secondary | ICD-10-CM

## 2020-09-26 NOTE — Patient Instructions (Signed)
Visit Information   PATIENT GOALS:   Goals Addressed             This Visit's Progress    RNCM:Track and Manage Fluids and Swelling-Heart Failure   On track    Timeframe:  Long-Range Goal Priority:  High Start Date:    09/26/20                         Expected End Date:  02/04/21                     Follow Up Date 11/03/20    - call office if I gain more than 2 pounds in one day or 5 pounds in one week - do ankle pumps when sitting - keep legs up while sitting - track weight in diary - use salt in moderation - watch for swelling in feet, ankles and legs every day - weigh myself daily    Why is this important?   It is important to check your weight daily and watch how much salt and liquids you have.  It will help you to manage your heart failure.    Notes:      RNCM:Track and Manage My Symptoms-COPD   On track    Timeframe:  Long-Range Goal Priority:  Medium Start Date:       09/26/20                      Expected End Date:   02/04/21                    Follow Up Date 11/03/20    - develop a rescue plan - eliminate symptom triggers at home - follow rescue plan if symptoms flare-up - keep follow-up appointments - use an extra pillow to sleep -take extra oxygen tank when going out     Why is this important?   Tracking your symptoms and other information about your health helps your doctor plan your care.  Write down the symptoms, the time of day, what you were doing and what medicine you are taking.  You will soon learn how to manage your symptoms.     Notes:        Heart Failure, Self-Care Heart failure is a serious condition. The following information explains things you need to do to take care of yourself at home. To help you stay as healthy as possible, you may be asked to change your diet, take certain medicines, and make other changes in your life. Your doctor may also give you more specificinstructions. If you have problems or questions, call your doctor. What  are the risks? Having heart failure makes it more likely for you to have some problems. These problems can get worse if you do not take good care of yourself. Problems may include: Damage to the kidneys, liver, or lungs. Malnutrition. Abnormal heart rhythms. Blood clotting problems that could cause a stroke. Supplies needed: Scale for weighing yourself. Blood pressure monitor. Notebook. Medicines. How to care for yourself when you have heart failure Medicines Take over-the-counter and prescription medicines only as told by your doctor. Take your medicines every day. Do not stop taking your medicine unless your doctor tells you to do so. Do not skip any medicines. Get your prescriptions refilled before you run out of medicine. This is important. Talk with your doctor if you cannot afford your medicines. Eating and drinking  Eat heart-healthy foods. Talk with a diet specialist (dietitian) to create an eating plan. Limit salt (sodium) if told by your doctor. Ask your diet specialist to tell you which seasonings are healthy for your heart. Cook in healthy ways instead of frying. Healthy ways of cooking include roasting, grilling, broiling, baking, poaching, steaming, and stir-frying. Choose foods that: Have no trans fat. Are low in saturated fat and cholesterol. Choose healthy foods, such as: Fresh or frozen fruits and vegetables. Fish. Low-fat (lean) meats. Legumes, such as beans, peas, and lentils. Fat-free or low-fat dairy products. Whole-grain foods. High-fiber foods. Limit how much fluid you drink, if told by your doctor.  Alcohol use Do not drink alcohol if: Your doctor tells you not to drink. Your heart was damaged by alcohol, or you have very bad heart failure. You are pregnant, may be pregnant, or are planning to become pregnant. If you drink alcohol: Limit how much you have to: 0-1 drink a day for women. 0-2 drinks a day for men. Know how much alcohol is in your  drink. In the [place] one drink equals one 12 oz bottle of beer (355 mL), one 5 oz glass of wine (148 mL), or one 1 oz glass of hard liquor (44 mL). Lifestyle  Do not smoke or use any products that contain nicotine or tobacco. If you need help quitting, ask your doctor. Do not use nicotine gum or patches before talking to your doctor. Do not use illegal drugs. Lose weight if told by your doctor. Do physical activity if told by your doctor. Talk to your doctor before you begin an exercise if: You are an older adult. You have very bad heart failure. Learn to manage stress. If you need help, ask your doctor. Get physical rehab (rehabilitation) to help you stay independent and to help with your quality of life. Participate in a cardiac rehab program. This program helps you improve your health through exercise, education, and counseling. Plan time to rest when you get tired.  Check weight and blood pressure  Weigh yourself every day. This will help you to know if fluid is building up in your body. Weigh yourself every morning after you pee (urinate) and before you eat breakfast. Wear the same amount of clothing each time. Write down your daily weight. Give your record to your doctor. Check and write down your blood pressure as told by your doctor. Check your pulse as told by your doctor.  Dealing with very hot and very cold weather If it is very hot: Avoid activities that take a lot of energy. Use air conditioning or fans, or find a cooler place. Avoid caffeine and alcohol. Wear clothing that is loose-fitting, lightweight, and light-colored. If it is very cold: Avoid activities that take a lot of energy. Layer your clothes. Wear mittens or gloves, a hat, and a face covering when you go outside. Avoid alcohol. Follow these instructions at home: Stay up to date with shots (vaccines). Get pneumococcal and flu (influenza) shots. Keep all follow-up visits. Contact a doctor if: You gain  2-3 lb (1-1.4 kg) in 24 hours or 5 lb (2.3 kg) in a week. You have increasing shortness of breath. You cannot do your normal activities. You get tired easily. You cough a lot. You do not feel like eating or feel like you may vomit (nauseous). You have swelling in your hands, feet, ankles, or belly (abdomen). You cannot sleep well because it is hard to breathe. You feel like your heart  is beating fast (palpitations). You get dizzy when you stand up. You feel depressed or sad. Get help right away if: You have trouble breathing. You or someone else notices a change in your behavior, such as having trouble staying awake. You have chest pain or discomfort. You pass out (faint). These symptoms may be an emergency. Get help right away. Call your local emergency services (911 in the [place] Do not wait to see if the symptoms will go away. Do not drive yourself to the hospital. Summary Heart failure is a serious condition. To care for yourself, you may have to change your diet, take medicines, and make other lifestyle changes. Take your medicines every day. Do not stop taking them unless your doctor tells you to do so. Limit salt and eat heart-healthy foods. Ask your doctor if you can drink alcohol. You may have to stop alcohol use if you have very bad heart failure. Contact your doctor if you gain weight quickly or feel that your heart is beating too fast. Get help right away if you pass out or have chest pain or trouble breathing. This information is not intended to replace advice given to you by your health care provider. Make sure you discuss any questions you have with your healthcare provider. Document Revised: 08/14/2019 Document Reviewed: 08/14/2019 Elsevier Patient Education  [place]. Chronic Obstructive Pulmonary Disease  Chronic obstructive pulmonary disease (COPD) is a long-term (chronic) lung problem. When you have COPD, it is hard for air to get in and out ofyour  lungs. Usually the condition gets worse over time, and your lungs will never return tonormal. There are things you can do to keep yourself as healthy as possible. What are the causes? Smoking. This is the most common cause. Certain genes passed from parent to child (inherited). What increases the risk? Being exposed to secondhand smoke from cigarettes, pipes, or cigars. Being exposed to chemicals and other irritants, such as fumes and dust in the work environment. Having chronic lung conditions or infections. What are the signs or symptoms? Shortness of breath, especially during physical activity. A long-term cough with a large amount of thick mucus. Sometimes, the cough may not have any mucus (dry cough). Wheezing. Breathing quickly. Skin that looks gray or blue, especially in the fingers, toes, or lips. Feeling tired (fatigue). Weight loss. Chest tightness. Having infections often. Episodes when breathing symptoms become much worse (exacerbations). At the later stages of this disease, you may have swelling in the ankles, feet,or legs. How is this treated? Taking medicines. Quitting smoking, if you smoke. Rehabilitation. This includes steps to make your body work better. It may involve a team of specialists. Doing exercises. Making changes to your diet. Using oxygen. Lung surgery. Lung transplant. Comfort measures (palliative care). Follow these instructions at home: Medicines Take over-the-counter and prescription medicines only as told by your doctor. Talk to your doctor before taking any cough or allergy medicines. You may need to avoid medicines that cause your lungs to be dry. Lifestyle If you smoke, stop smoking. Smoking makes the problem worse. Do not smoke or use any products that contain nicotine or tobacco. If you need help quitting, ask your doctor. Avoid being around things that make your breathing worse. This may include smoke, chemicals, and fumes. Stay active,  but remember to rest as well. Learn and use tips on how to manage stress and control your breathing. Make sure you get enough sleep. Most adults need at least 7 hours of sleep  every night. Eat healthy foods. Eat smaller meals more often. Rest before meals. Controlled breathing Learn and use tips on how to control your breathing as told by your doctor. Try: Breathing in (inhaling) through your nose for 1 second. Then, pucker your lips and breath out (exhale) through your lips for 2 seconds. Putting one hand on your belly (abdomen). Breathe in slowly through your nose for 1 second. Your hand on your belly should move out. Pucker your lips and breathe out slowly through your lips. Your hand on your belly should move in as you breathe out.  Controlled coughing Learn and use controlled coughing to clear mucus from your lungs. Follow these steps: Lean your head a little forward. Breathe in deeply. Try to hold your breath for 3 seconds. Keep your mouth slightly open while coughing 2 times. Spit any mucus out into a tissue. Rest and do the steps again 1 or 2 times as needed. General instructions Make sure you get all the shots (vaccines) that your doctor recommends. Ask your doctor about a flu shot and a pneumonia shot. Use oxygen therapy and pulmonary rehabilitation if told by your doctor. If you need home oxygen therapy, ask your doctor if you should buy a tool to measure your oxygen level (oximeter). Make a COPD action plan with your doctor. This helps you to know what to do if you feel worse than usual. Manage any other conditions you have as told by your doctor. Avoid going outside when it is very hot, cold, or humid. Avoid people who have a sickness you can catch (contagious). Keep all follow-up visits. Contact a doctor if: You cough up more mucus than usual. There is a change in the color or thickness of the mucus. It is harder to breathe than usual. Your breathing is faster than  usual. You have trouble sleeping. You need to use your medicines more often than usual. You have trouble doing your normal activities such as getting dressed or walking around the house. Get help right away if: You have shortness of breath while resting. You have shortness of breath that stops you from: Being able to talk. Doing normal activities. Your chest hurts for longer than 5 minutes. Your skin color is more blue than usual. Your pulse oximeter shows that you have low oxygen for longer than 5 minutes. You have a fever. You feel too tired to breathe normally. These symptoms may represent a serious problem that is an emergency. Do not wait to see if the symptoms will go away. Get medical help right away. Call your local emergency services (911 in the [place] Do not drive yourself to the hospital. Summary Chronic obstructive pulmonary disease (COPD) is a long-term lung problem. The way your lungs work will never return to normal. Usually the condition gets worse over time. There are things you can do to keep yourself as healthy as possible. Take over-the-counter and prescription medicines only as told by your doctor. If you smoke, stop. Smoking makes the problem worse. This information is not intended to replace advice given to you by your health care provider. Make sure you discuss any questions you have with your healthcare provider. Document Revised: 11/30/2019 Document Reviewed: 11/30/2019 Elsevier Patient Education  2022 [employer].  Consent to CCM Services: Ms. Bronder was given information about Chronic Care Management services including:  CCM service includes personalized support from designated clinical staff supervised by her physician, including individualized plan of care and coordination with other care providers 24/7  contact phone numbers for assistance for urgent and routine care needs. Service will only be billed when office clinical staff spend 20 minutes or more in  a month to coordinate care. Only one practitioner may furnish and bill the service in a calendar month. The patient may stop CCM services at any time (effective at the end of the month) by phone call to the office staff. The patient will be responsible for cost sharing (co-pay) of up to 20% of the service fee (after annual deductible is met).  Patient agreed to services and verbal consent obtained.   The patient verbalized understanding of instructions, educational materials, and care plan provided today and agreed to receive a mailed copy of patient instructions, educational materials, and care plan.   Telephone follow up appointment with care management team member scheduled for: Peter Garter RN, [facility], CDE Care Management Coordinator [facility] [phone], Mobile [phone]   CLINICAL CARE PLAN: Patient Care Plan: Cardiovascular disease  (CHF,HTN and HLD)     Problem Identified: Lack of long term self management of Cardiovascular disease  (CHF,HTN and HLD)   Priority: High     Long-Range Goal: Effective long term self management of Cardiovascular disease  (CHF,HTN and HLD   Start Date: 09/26/2020  Expected End Date: 02/04/2021  This Visit's Progress: On track  Priority: High  Note:   Current Barriers:  Knowledge deficits related to basic heart failure pathophysiology and Cardiovascular disease  (CHF,HTN and HLD) self care management Unable to independently self manage Cardiovascular disease  (CHF,HTN and HLD Does not adhere to provider recommendations re: smoking Financial strain Pt states that she saw cardiology yesterday and her fluid pill was decreased and she picked up her Jardiance.  Denies any swelling.  States she tries to weight every day but sometimes forgets.  States she does not eat much and will sometimes go without eating as she is not hungry Nurse Case Manager Clinical Goal(s):  patient will weigh self daily and record patient will  verbalize understanding of Heart Failure Action Plan and when to call doctor patient will take all Heart Failure mediations as prescribed Interventions:  Collaboration with [name], [name], MD regarding development and update of comprehensive plan of care as evidenced by provider attestation and co-signature Inter-disciplinary care team collaboration (see longitudinal plan of care) Basic overview and discussion of pathophysiology of Heart Failure Provided written and verbal education on low sodium diet Reviewed Heart Failure Action Plan in depth and provided written copy Assessed for scales in home-has scale Discussed importance of daily weight Reviewed role of diuretics in prevention of fluid overload Self-Care Activities:  Takes Heart Failure Medications as prescribed Weighs daily and record (notifying MD of 3 lb weight gain over night or 5 lb in a week) Verbalizes understanding of and follows CHF Action Plan Adheres to low sodium diet  Patient Goals:  - Take Heart Failure Medications as prescribed - Weigh daily and record (notify MD with 3 lb weight gain over night or 5 lb in a week) - Follow CHF Action Plan - Adhere to low sodium diet - develop a rescue plan - eat more whole grains, fruits and vegetables, lean meats and healthy fats - follow rescue plan if symptoms flare-up - know when to call the doctor - track symptoms and what helps feel better or worse - dress right for the weather, hot or cold - pace activity allowing for rest - track symptoms during activity in diary - call office if  I gain more than 2 pounds in one day or 5 pounds in one week - do ankle pumps when sitting - keep legs up while sitting - track weight in diary - use salt in moderation - watch for swelling in feet, ankles and legs every day - weigh myself daily Follow Up Plan: Telephone follow up appointment with care management team member scheduled for: 11/03/20 at 1 PM The patient has been  provided with contact information for the care management team and has been advised to call with any health related questions or concerns.      Patient Care Plan: COPD (Adult)     Problem Identified: Symptom Exacerbation (COPD)      Long-Range Goal: Symptom Exacerbation Prevented or Minimized   Start Date: 09/26/2020  Expected End Date: 02/04/2021  This Visit's Progress: On track  Priority: Medium  Note:   Current Barriers:  Knowledge deficits related to basic understanding of COPD disease process Knowledge deficits related to basic COPD self care/management Knowledge deficit related to basic understanding of how to use inhalers and how inhaled medications work Knowledge deficit related to importance of energy conservation Cannot afford prescribed medications Unable to independently self manage COPD Does not adhere to provider recommendations re:  smoking Pt states that she wears her oxygen at 3-4 L/min.  Denies any cough.  States she gets short of breath with very little exertion.   States that the [facility] inhaler does help but she does not think she will be able to afford it.  States the pulmonary doctor gave her samples. States her oxygen tank sometimes runs out when she goes out for appointments.  States she is smoking about 1/2 pack a day and she would like to cut back.  States she can not afford the nicotine patches.  States she would lkie to get a new psychiatrist as she is no longer seeing someone. Case Manager Clinical Goal(s): patient will report using inhalers as prescribed including rinsing mouth after use patient will verbalize understanding of COPD action plan and when to seek appropriate levels of medical care patient will verbalize basic understanding of COPD disease process and self care activities patient will not be hospitalized for COPD exacerbation as evidenced  Interventions:  Collaboration with [name], [name], MD regarding development and update of  comprehensive plan of care as evidenced by provider attestation and co-signature Inter-disciplinary care team collaboration (see longitudinal plan of care) Provided patient with basic written and verbal COPD education on self care/management/and exacerbation prevention  Provided patient with COPD action plan and reinforced importance of daily self assessment Provided instruction about proper use of medications used for management of COPD including inhalers Advised patient to self assesses COPD action plan zone and make appointment with provider if in the yellow zone for 48 hours without improvement. Provided education about and advised patient to utilize infection prevention strategies to reduce risk of respiratory infection  Reviewed to take an extra oxygen tank with her when she goes out and to make sure the tanks are full Referral to CCM pharmacist for cost of medications and need for nicotine patches for smoking cessation Referral to CCM LCSW for  counseling and assistance in finding new psychiatrist  [place]  calendar with COPD and HF zones Self-Care Activities:  Patient verbalizes understanding of plan to self manage COPD Self administers medications as prescribed Attends all scheduled provider appointments Calls pharmacy for medication refills Calls provider office for new concerns or questions Patient Goals: -  eat healthy - get at least 7 to 8 hours of sleep at night - get outdoors every day (weather permitting) - keep room cool and dark - limit daytime naps - practice relaxation or meditation daily - use a fan or white noise in bedroom - use devices that will help like a cane, sock-puller or reacher - develop a rescue plan - eliminate symptom triggers at home - follow rescue plan if symptoms flare-up - keep follow-up appointments - use an extra pillow to sleep - eliminate smoking in my home - identify and remove indoor air pollutants - limit outdoor  activity during cold weather - listen for public air quality announcements every day -take extra oxygen tank when going to appointments Follow Up Plan: Telephone follow up appointment with care management team member scheduled for: 11/03/20 at 1 PM The patient has been provided with contact information for the care management team and has been advised to call with any health related questions or concerns.

## 2020-09-26 NOTE — Chronic Care Management (AMB) (Signed)
Chronic Care Management   CCM RN Visit Note  09/26/2020 Name: [name] MRN: [id number] DOB: 05/12/1955  Subjective: [name] is a 65 y.o. year old female who is a primary care patient of [name], [name], MD. The care management team was consulted for assistance with disease management and care coordination needs.    Engaged with patient by telephone for initial visit in response to provider referral for case management and/or care coordination services.   Consent to Services:  The patient was given the following information about Chronic Care Management services today, agreed to services, and gave verbal consent: 1. CCM service includes personalized support from designated clinical staff supervised by the primary care provider, including individualized plan of care and coordination with other care providers 2. 24/7 contact phone numbers for assistance for urgent and routine care needs. 3. Service will only be billed when office clinical staff spend 20 minutes or more in a month to coordinate care. 4. Only one practitioner may furnish and bill the service in a calendar month. 5.The patient may stop CCM services at any time (effective at the end of the month) by phone call to the office staff. 6. The patient will be responsible for cost sharing (co-pay) of up to 20% of the service fee (after annual deductible is met). Patient agreed to services and consent obtained.  Patient agreed to services and verbal consent obtained.   Assessment: Review of patient past medical history, allergies, medications, health status, including review of consultants reports, laboratory and other test data, was performed as part of comprehensive evaluation and provision of chronic care management services.   SDOH (Social Determinants of Health) assessments and interventions performed:  SDOH Interventions    Flowsheet Row Most Recent Value  SDOH Interventions   Food Insecurity  Interventions Intervention Not Indicated  Housing Interventions Intervention Not Indicated  Transportation Interventions Intervention Not Indicated        CCM Care Plan  Allergies  Allergen Reactions   Mirtazapine Other (See Comments)    halucinations   Acetaminophen-Codeine Nausea And Vomiting   Aspirin Nausea And Vomiting    (can take coated aspirin)   Astelin [Azelastine]    Bactrim [Sulfamethoxazole-Trimethoprim]    Celecoxib Other (See Comments)    "does the opposite of what it is suppose to do"   Ciprofloxacin    Fentanyl    Levaquin [Levofloxacin In D5w] Itching   Lipitor [Atorvastatin] Other (See Comments)    Myalgias    Neomycin    Salicylates    Sulfa Antibiotics Nausea And Vomiting   Adhesive [Tape] Rash   Latex Rash   Tegretol [Carbamazepine] Rash    Outpatient Encounter Medications as of 09/26/2020  Medication Sig   albuterol (PROAIR HFA) 108 (90 Base) MCG/ACT inhaler Inhale 2 puffs into the lungs every 4 (four) hours as needed for wheezing or shortness of breath (cough).   ALPRAZolam (XANAX) 0.5 MG tablet TAKE 1 TABLET BY MOUTH TWICE DAILY AS NEEDED FOR ANXIETY.   aspirin EC 81 MG tablet Take 81 mg by mouth daily.   Budeson-Glycopyrrol-Formoterol (BREZTRI AEROSPHERE) 160-9-4.8 MCG/ACT AERO Inhale 2 puffs into the lungs in the morning and at bedtime.   DULoxetine (CYMBALTA) 60 MG capsule Take 1 capsule (60 mg total) by mouth 2 (two) times daily.   empagliflozin (JARDIANCE) 10 MG TABS tablet Take 1 tablet (10 mg total) by mouth daily.   ezetimibe (ZETIA) 10 MG tablet Take 1 tablet (10 mg total) by mouth daily.  levothyroxine (SYNTHROID) 50 MCG tablet Take 1 tablet (50 mcg total) by mouth daily before breakfast.   midodrine (PROAMATINE) 5 MG tablet Take 1 tablet (5 mg total) by mouth 3 (three) times daily with meals.   mirtazapine (REMERON) 15 MG tablet TAKE (1) TABLET BY MOUTH AT BEDTIME.   nicotine (NICODERM CQ - DOSED IN MG/24 HR) 7 mg/24hr patch Place 1  patch (7 mg total) onto the skin daily.   OVER THE COUNTER MEDICATION Take 1 capsule by mouth daily. Amberen Multi-Symptom Menopause Relief   oxyCODONE (OXY IR/ROXICODONE) 5 MG immediate release tablet TAKE (1) TABLET BY MOUTH EVERY SIX HOURS AS NEEDED FOR SEVERE PAIN.   pantoprazole (PROTONIX) 40 MG tablet TAKE ONE TABLET BY MOUTH TWICE DAILY BEFORE A MEAL.   pregabalin (LYRICA) 50 MG capsule Take 1 capsule (50 mg total) by mouth 3 (three) times daily. Actually 1 in Am and 2 at night- for nerve pain   QUEtiapine (SEROQUEL) 50 MG tablet Take 1-2 tablets (50-100 mg total) by mouth at bedtime.   torsemide (DEMADEX) 20 MG tablet Take 1 tablet (20 mg total) by mouth daily.   UNABLE TO FIND Med Name: OTC Amberen (for hot flashes)   No facility-administered encounter medications on file as of 09/26/2020.    Patient Active Problem List   Diagnosis Date Noted   Palliative care by specialist    Acute on chronic respiratory failure with hypoxia and hypercapnia ([place]) 07/26/2020   Acute on chronic combined systolic and diastolic CHF (congestive heart failure) ([place]) 07/26/2020   Idiopathic hypotension 10/08/2019   Encounter for long-term use of opiate analgesic 03/12/2019   IDA (iron deficiency anemia) 02/17/2019   Gastritis and gastroduodenitis 02/17/2019   COPD  GOLD II / still smoking  12/08/2018   Chronic respiratory failure with hypoxia and hypercapnia ([place]) 12/08/2018   Radiculopathy due to lumbar intervertebral disc disorder 10/29/2018   Sciatica of left side 10/29/2018   Anemia    Gastrointestinal hemorrhage    Elevated LFTs    Hypokalemia 08/24/2018   COPD exacerbation ([place]) 03/18/2014   Acute kidney injury ([place]) 03/18/2014   Acute respiratory failure with hypoxia ([place]) 03/18/2014   CAP (community acquired pneumonia) 03/17/2014   Pneumonia 03/17/2014   HTN (hypertension) 12/03/2012   Sepsis ([place]) 02/21/2012   PNA (pneumonia) 02/21/2012   Adrenal insufficiency ([facility]) 02/21/2012    Subclavian steal syndrome 02/21/2012   Chronic pain 11/22/2010   Migraine headache 11/22/2010   Gastroesophageal reflux disease 11/22/2010   Goals of care, counseling/discussion 11/22/2010   Cerebrovascular disease 07/24/2010   Anxiety and depression    Hyperlipidemia    Hypothyroid    Cigarette smoker     Conditions to be addressed/monitored:CHF, HTN, HLD, COPD, and Anxiety  Care Plan : Cardiovascular disease  (CHF,HTN and HLD)  Updates made by Dimitri Ped, RN since 09/26/2020 12:00 AM     Problem: Lack of long term self management of Cardiovascular disease  (CHF,HTN and HLD)   Priority: High     Long-Range Goal: Effective long term self management of Cardiovascular disease  (CHF,HTN and HLD   Start Date: 09/26/2020  Expected End Date: 02/04/2021  This Visit's Progress: On track  Priority: High  Note:   Current Barriers:  Knowledge deficits related to basic heart failure pathophysiology and Cardiovascular disease  (CHF,HTN and HLD) self care management Unable to independently self manage Cardiovascular disease  (CHF,HTN and HLD Does not adhere to provider recommendations re: smoking Financial strain Pt states that she  saw cardiology yesterday and her fluid pill was decreased and she picked up her Jardiance.  Denies any swelling.  States she tries to weight every day but sometimes forgets.  States she does not eat much and will sometimes go without eating as she is not hungry Nurse Case Manager Clinical Goal(s):  patient will weigh self daily and record patient will verbalize understanding of Heart Failure Action Plan and when to call doctor patient will take all Heart Failure mediations as prescribed Interventions:  Collaboration with [name], [name], MD regarding development and update of comprehensive plan of care as evidenced by provider attestation and co-signature Inter-disciplinary care team collaboration (see longitudinal plan of care) Basic overview and  discussion of pathophysiology of Heart Failure Provided written and verbal education on low sodium diet Reviewed Heart Failure Action Plan in depth and provided written copy Assessed for scales in home-has scale Discussed importance of daily weight Reviewed role of diuretics in prevention of fluid overload Self-Care Activities:  Takes Heart Failure Medications as prescribed Weighs daily and record (notifying MD of 3 lb weight gain over night or 5 lb in a week) Verbalizes understanding of and follows CHF Action Plan Adheres to low sodium diet  Patient Goals:  - Take Heart Failure Medications as prescribed - Weigh daily and record (notify MD with 3 lb weight gain over night or 5 lb in a week) - Follow CHF Action Plan - Adhere to low sodium diet - develop a rescue plan - eat more whole grains, fruits and vegetables, lean meats and healthy fats - follow rescue plan if symptoms flare-up - know when to call the doctor - track symptoms and what helps feel better or worse - dress right for the weather, hot or cold - pace activity allowing for rest - track symptoms during activity in diary - call office if I gain more than 2 pounds in one day or 5 pounds in one week - do ankle pumps when sitting - keep legs up while sitting - track weight in diary - use salt in moderation - watch for swelling in feet, ankles and legs every day - weigh myself daily Follow Up Plan: Telephone follow up appointment with care management team member scheduled for: 11/03/20 at 1 PM The patient has been provided with contact information for the care management team and has been advised to call with any health related questions or concerns.      Care Plan : COPD (Adult)  Updates made by Dimitri Ped, RN since 09/26/2020 12:00 AM     Problem: Symptom Exacerbation (COPD)      Long-Range Goal: Symptom Exacerbation Prevented or Minimized   Start Date: 09/26/2020  Expected End Date: 02/04/2021  This Visit's  Progress: On track  Priority: Medium  Note:   Current Barriers:  Knowledge deficits related to basic understanding of COPD disease process Knowledge deficits related to basic COPD self care/management Knowledge deficit related to basic understanding of how to use inhalers and how inhaled medications work Knowledge deficit related to importance of energy conservation Cannot afford prescribed medications Unable to independently self manage COPD Does not adhere to provider recommendations re:  smoking Pt states that she wears her oxygen at 3-4 L/min.  Denies any cough.  States she gets short of breath with very little exertion.   States that the [facility] inhaler does help but she does not think she will be able to afford it.  States the pulmonary doctor gave her samples. States  her oxygen tank sometimes runs out when she goes out for appointments.  States she is smoking about 1/2 pack a day and she would like to cut back.  States she can not afford the nicotine patches.  States she would lkie to get a new psychiatrist as she is no longer seeing someone. Case Manager Clinical Goal(s): patient will report using inhalers as prescribed including rinsing mouth after use patient will verbalize understanding of COPD action plan and when to seek appropriate levels of medical care patient will verbalize basic understanding of COPD disease process and self care activities patient will not be hospitalized for COPD exacerbation as evidenced  Interventions:  Collaboration with [name], [name], MD regarding development and update of comprehensive plan of care as evidenced by provider attestation and co-signature Inter-disciplinary care team collaboration (see longitudinal plan of care) Provided patient with basic written and verbal COPD education on self care/management/and exacerbation prevention  Provided patient with COPD action plan and reinforced importance of daily self assessment Provided  instruction about proper use of medications used for management of COPD including inhalers Advised patient to self assesses COPD action plan zone and make appointment with provider if in the yellow zone for 48 hours without improvement. Provided education about and advised patient to utilize infection prevention strategies to reduce risk of respiratory infection  Reviewed to take an extra oxygen tank with her when she goes out and to make sure the tanks are full Referral to CCM pharmacist for cost of medications and need for nicotine patches for smoking cessation Referral to CCM LCSW for  counseling and assistance in finding new psychiatrist  [place]  calendar with COPD and HF zones Self-Care Activities:  Patient verbalizes understanding of plan to self manage COPD Self administers medications as prescribed Attends all scheduled provider appointments Calls pharmacy for medication refills Calls provider office for new concerns or questions Patient Goals: - eat healthy - get at least 7 to 8 hours of sleep at night - get outdoors every day (weather permitting) - keep room cool and dark - limit daytime naps - practice relaxation or meditation daily - use a fan or white noise in bedroom - use devices that will help like a cane, sock-puller or reacher - develop a rescue plan - eliminate symptom triggers at home - follow rescue plan if symptoms flare-up - keep follow-up appointments - use an extra pillow to sleep - eliminate smoking in my home - identify and remove indoor air pollutants - limit outdoor activity during cold weather - listen for public air quality announcements every day -take extra oxygen tank when going to appointments Follow Up Plan: Telephone follow up appointment with care management team member scheduled for: 11/03/20 at 1 PM The patient has been provided with contact information for the care management team and has been advised to call with any  health related questions or concerns.       Plan:Telephone follow up appointment with care management team member scheduled for:  11/03/20 and The patient has been provided with contact information for the care management team and has been advised to call with any health related questions or concerns.  Peter Garter RN, Jackquline Denmark, CDE Care Management Coordinator  [facility] [phone], Mobile [phone]

## 2020-09-28 NOTE — Progress Notes (Signed)
Scheduled for Initial call with SW on 10/11/20

## 2020-10-06 LAB — BASIC METABOLIC PANEL
BUN/Creatinine Ratio: 15 (ref 12–28)
BUN: 20 mg/dL (ref 8–27)
CO2: 34 mmol/L — ABNORMAL HIGH (ref 20–29)
Calcium: 10.6 mg/dL — ABNORMAL HIGH (ref 8.7–10.3)
Chloride: 92 mmol/L — ABNORMAL LOW (ref 96–106)
Creatinine, Ser: 1.3 mg/dL — ABNORMAL HIGH (ref 0.57–1.00)
Glucose: 83 mg/dL (ref 65–99)
Potassium: 3.7 mmol/L (ref 3.5–5.2)
Sodium: 140 mmol/L (ref 134–144)
eGFR: 46 mL/min/{1.73_m2} — ABNORMAL LOW (ref >59)

## 2020-10-06 LAB — MAGNESIUM: Magnesium: 2.1 mg/dL (ref 1.6–2.3)

## 2020-10-11 ENCOUNTER — Telehealth: Payer: Self-pay | Admitting: *Deleted

## 2020-10-11 ENCOUNTER — Telehealth: Payer: Medicare Other | Admitting: *Deleted

## 2020-10-11 ENCOUNTER — Other Ambulatory Visit: Payer: Self-pay | Admitting: Registered Nurse

## 2020-10-11 ENCOUNTER — Other Ambulatory Visit: Payer: Self-pay | Admitting: Physical Medicine and Rehabilitation

## 2020-10-11 NOTE — Telephone Encounter (Signed)
  Care Management   Follow Up Note   10/11/2020 Name: [name] MRN: [id number] DOB: Jul 11, 1955   Referred by: [name], [name], MD Reason for referral : Chronic Care Management in Patient with CHF, HTN, COPD, Chronic Pain, Anxiety and Depression.   An unsuccessful telephone outreach was attempted today. The patient was referred to the case management team for assistance with care management and care coordination. A HIPAA compliant message was left on voicemail for patient, including contact information, as LCSW awaits a return call.  Follow-Up Plan:  LCSW placed a request to the Care Guides to outreach to patient to schedule a second initial telephone outreach call attempt.  Nat Christen LCSW Licensed Clinical Social Worker [place] [phone]

## 2020-10-25 ENCOUNTER — Telehealth: Payer: Self-pay | Admitting: Family Medicine

## 2020-10-25 NOTE — Telephone Encounter (Signed)
Lab results discussed with patient. Copied pcp

## 2020-10-25 NOTE — Telephone Encounter (Signed)
Calling for lab results. °

## 2020-10-25 NOTE — Telephone Encounter (Signed)
Laurine Blazer, LPN  8/0/3212  2:48 AM EDT Back to Top    Left message to return call.   Verta Ellen., NP  10/06/2020  7:49 AM EDT     Renal function has improved some after recent lab work.  Continue current medications as they are prescribed.  Thank you   Verta Ellen, NP  10/06/2020 7:48 AM

## 2020-10-27 ENCOUNTER — Telehealth: Payer: Self-pay | Admitting: *Deleted

## 2020-10-27 ENCOUNTER — Telehealth: Payer: Medicare Other | Admitting: *Deleted

## 2020-10-27 NOTE — Telephone Encounter (Signed)
Erroneous Encounter.  Nat Christen LCSW Licensed Clinical Social Worker [place] [phone]

## 2020-10-27 NOTE — Telephone Encounter (Signed)
  Care Management   Follow Up Note   10/27/2020 Name: [name] MRN: [id number] DOB: 18-May-1955   Referred by: [name], [name], MD Reason for referral : Chronic Care Management in Patient with CHF, HTN, COPD, Chronic Pain, Anxiety and Depression.   A second unsuccessful telephone outreach was attempted today. The patient was referred to the case management team for assistance with care management and care coordination. HIPAA compliant messages were left on voicemail for patient, including contact information, encouraging patient to return LCSW's call at her earliest convenience.  LCSW will make a third outreach call attempt within the next 5-7 business days, if a return call is not received from patient in the meantime.  Follow-Up Plan:  Request placed to Care Guides to reschedule patient for an initial outreach call with LCSW.  Nat Christen LCSW Licensed Clinical Social Worker [place] [phone]

## 2020-11-03 ENCOUNTER — Ambulatory Visit (INDEPENDENT_AMBULATORY_CARE_PROVIDER_SITE_OTHER): Payer: Medicare Other

## 2020-11-03 ENCOUNTER — Other Ambulatory Visit: Payer: Self-pay | Admitting: Physical Medicine and Rehabilitation

## 2020-11-03 DIAGNOSIS — F419 Anxiety disorder, unspecified: Secondary | ICD-10-CM

## 2020-11-03 DIAGNOSIS — I10 Essential (primary) hypertension: Secondary | ICD-10-CM

## 2020-11-03 DIAGNOSIS — J449 Chronic obstructive pulmonary disease, unspecified: Secondary | ICD-10-CM | POA: Diagnosis not present

## 2020-11-03 DIAGNOSIS — E782 Mixed hyperlipidemia: Secondary | ICD-10-CM | POA: Diagnosis not present

## 2020-11-03 DIAGNOSIS — J9622 Acute and chronic respiratory failure with hypercapnia: Secondary | ICD-10-CM

## 2020-11-03 DIAGNOSIS — J9621 Acute and chronic respiratory failure with hypoxia: Secondary | ICD-10-CM

## 2020-11-03 DIAGNOSIS — F32A Depression, unspecified: Secondary | ICD-10-CM

## 2020-11-03 DIAGNOSIS — F17210 Nicotine dependence, cigarettes, uncomplicated: Secondary | ICD-10-CM

## 2020-11-03 NOTE — Chronic Care Management (AMB) (Signed)
Chronic Care Management   CCM RN Visit Note  11/03/2020 Name: [name] MRN: [id number] DOB: May 26, 1955  Subjective: [name] is a 65 y.o. year old female who is a primary care patient of [name], [name], MD. The care management team was consulted for assistance with disease management and care coordination needs.    Engaged with patient by telephone for follow up visit in response to provider referral for case management and/or care coordination services.   Consent to Services:  The patient was given information about Chronic Care Management services, agreed to services, and gave verbal consent prior to initiation of services.  Please see initial visit note for detailed documentation.   Patient agreed to services and verbal consent obtained.   Assessment: Review of patient past medical history, allergies, medications, health status, including review of consultants reports, laboratory and other test data, was performed as part of comprehensive evaluation and provision of chronic care management services.   SDOH (Social Determinants of Health) assessments and interventions performed:    CCM Care Plan  Allergies  Allergen Reactions   Mirtazapine Other (See Comments)    halucinations   Acetaminophen-Codeine Nausea And Vomiting   Aspirin Nausea And Vomiting    (can take coated aspirin)   Astelin [Azelastine]    Bactrim [Sulfamethoxazole-Trimethoprim]    Celecoxib Other (See Comments)    "does the opposite of what it is suppose to do"   Ciprofloxacin    Fentanyl    Levaquin [Levofloxacin In D5w] Itching   Lipitor [Atorvastatin] Other (See Comments)    Myalgias    Neomycin    Salicylates    Sulfa Antibiotics Nausea And Vomiting   Adhesive [Tape] Rash   Latex Rash   Tegretol [Carbamazepine] Rash    Outpatient Encounter Medications as of 11/03/2020  Medication Sig   albuterol (PROAIR HFA) 108 (90 Base) MCG/ACT inhaler Inhale 2 puffs into the lungs  every 4 (four) hours as needed for wheezing or shortness of breath (cough).   ALPRAZolam (XANAX) 0.5 MG tablet TAKE 1 TABLET BY MOUTH TWICE DAILY AS NEEDED FOR ANXIETY.   aspirin EC 81 MG tablet Take 81 mg by mouth daily.   Budeson-Glycopyrrol-Formoterol (BREZTRI AEROSPHERE) 160-9-4.8 MCG/ACT AERO Inhale 2 puffs into the lungs in the morning and at bedtime.   DULoxetine (CYMBALTA) 60 MG capsule Take 1 capsule (60 mg total) by mouth 2 (two) times daily.   empagliflozin (JARDIANCE) 10 MG TABS tablet Take 1 tablet (10 mg total) by mouth daily.   ezetimibe (ZETIA) 10 MG tablet Take 1 tablet (10 mg total) by mouth daily.   levothyroxine (SYNTHROID) 50 MCG tablet Take 1 tablet (50 mcg total) by mouth daily before breakfast.   midodrine (PROAMATINE) 5 MG tablet Take 1 tablet (5 mg total) by mouth 3 (three) times daily with meals.   mirtazapine (REMERON) 15 MG tablet TAKE (1) TABLET BY MOUTH AT BEDTIME.   nicotine (NICODERM CQ - DOSED IN MG/24 HR) 7 mg/24hr patch Place 1 patch (7 mg total) onto the skin daily.   OVER THE COUNTER MEDICATION Take 1 capsule by mouth daily. Amberen Multi-Symptom Menopause Relief   oxyCODONE (OXY IR/ROXICODONE) 5 MG immediate release tablet TAKE (1) TABLET BY MOUTH EVERY SIX HOURS AS NEEDED FOR SEVERE PAIN.   pantoprazole (PROTONIX) 40 MG tablet TAKE ONE TABLET BY MOUTH TWICE DAILY BEFORE A MEAL.   pregabalin (LYRICA) 50 MG capsule TAKE 1 CAPSULE BY MOUTH IN THEMORNING AND 2 CAPSULES AT BEDTIME.  QUEtiapine (SEROQUEL) 50 MG tablet TAKE 1 OR 2 TABLETS BY MOUTH AT BEDTIME   torsemide (DEMADEX) 20 MG tablet Take 1 tablet (20 mg total) by mouth daily.   UNABLE TO FIND Med Name: OTC Amberen (for hot flashes)   No facility-administered encounter medications on file as of 11/03/2020.    Patient Active Problem List   Diagnosis Date Noted   Palliative care by specialist    Acute on chronic respiratory failure with hypoxia and hypercapnia ([place]) 07/26/2020   Acute on chronic  combined systolic and diastolic CHF (congestive heart failure) ([address]) 07/26/2020   Idiopathic hypotension 10/08/2019   Encounter for long-term use of opiate analgesic 03/12/2019   IDA (iron deficiency anemia) 02/17/2019   Gastritis and gastroduodenitis 02/17/2019   COPD  GOLD II / still smoking  12/08/2018   Chronic respiratory failure with hypoxia and hypercapnia ([place]) 12/08/2018   Radiculopathy due to lumbar intervertebral disc disorder 10/29/2018   Sciatica of left side 10/29/2018   Anemia    Gastrointestinal hemorrhage    Elevated LFTs    Hypokalemia 08/24/2018   COPD exacerbation ([place]) 03/18/2014   Acute kidney injury ([place]) 03/18/2014   Acute respiratory failure with hypoxia ([place]) 03/18/2014   CAP (community acquired pneumonia) 03/17/2014   Pneumonia 03/17/2014   HTN (hypertension) 12/03/2012   Sepsis ([place]) 02/21/2012   PNA (pneumonia) 02/21/2012   Adrenal insufficiency ([facility]) 02/21/2012   Subclavian steal syndrome 02/21/2012   Chronic pain 11/22/2010   Migraine headache 11/22/2010   Gastroesophageal reflux disease 11/22/2010   Goals of care, counseling/discussion 11/22/2010   Cerebrovascular disease 07/24/2010   Anxiety and depression    Hyperlipidemia    Hypothyroid    Cigarette smoker     Conditions to be addressed/monitored:CHF, HTN, HLD, COPD, Anxiety, and Depression  Care Plan : RNCM:Cardiovascular disease  (CHF,HTN and HLD)  Updates made by Dimitri Ped, RN since 11/03/2020 12:00 AM     Problem: Lack of long term self management of Cardiovascular disease  (CHF,HTN and HLD)   Priority: High     Long-Range Goal: Effective long term self management of Cardiovascular disease  (CHF,HTN and HLD   Start Date: 09/26/2020  Expected End Date: 02/04/2021  This Visit's Progress: On track  Recent Progress: On track  Priority: High  Note:   Current Barriers:  Knowledge deficits related to basic heart failure pathophysiology and Cardiovascular disease  (CHF,HTN and  HLD) self care management Unable to independently self manage Cardiovascular disease  (CHF,HTN and HLD Does not adhere to provider recommendations re: smoking Financial strain   Denies any swelling, chest pains or increase in shortness of breath.  States she has been weighting every day and her weight was 104 today.  States she does not eat much and will sometimes go without eating as she is not hungry.  States she and her husband have been trying to eat better. Nurse Case Manager Clinical Goal(s):  patient will weigh self daily and record patient will verbalize understanding of Heart Failure Action Plan and when to call doctor patient will take all Heart Failure mediations as prescribed Interventions:  Collaboration with [name], [name], MD regarding development and update of comprehensive plan of care as evidenced by provider attestation and co-signature Inter-disciplinary care team collaboration (see longitudinal plan of care) Reviewed basic overview and discussion of pathophysiology of Heart Failure Reinforced to follow a  low sodium diet Reinforced Heart Failure Action Plan and when to call provider Reinforced importance of daily weight Reviewed  role of diuretics in prevention of fluid overload Self-Care Activities:  Takes Heart Failure Medications as prescribed Weighs daily and record (notifying MD of 3 lb weight gain over night or 5 lb in a week) Verbalizes understanding of and follows CHF Action Plan Adheres to low sodium diet  Patient Goals:  - Take Heart Failure Medications as prescribed - Weigh daily and record (notify MD with 3 lb weight gain over night or 5 lb in a week) - Follow CHF Action Plan - Adhere to low sodium diet - develop a rescue plan - eat more whole grains, fruits and vegetables, lean meats and healthy fats - follow rescue plan if symptoms flare-up - know when to call the doctor - track symptoms and what helps feel better or worse - dress right  for the weather, hot or cold - pace activity allowing for rest - track symptoms during activity in diary - call office if I gain more than 2 pounds in one day or 5 pounds in one week - do ankle pumps when sitting - keep legs up while sitting - track weight in diary - use salt in moderation - watch for swelling in feet, ankles and legs every day - weigh myself daily Follow Up Plan: Telephone follow up appointment with care management team member scheduled for: 11/27/20 at 3:30 PM The patient has been provided with contact information for the care management team and has been advised to call with any health related questions or concerns.      Care Plan : RNCM:COPD (Adult)  Updates made by Dimitri Ped, RN since 11/03/2020 12:00 AM     Problem: Symptom Exacerbation (COPD)      Long-Range Goal: Symptom Exacerbation Prevented or Minimized   Start Date: 09/26/2020  Expected End Date: 02/04/2021  This Visit's Progress: On track  Recent Progress: On track  Priority: Medium  Note:   Current Barriers:  Knowledge deficits related to basic understanding of COPD disease process Knowledge deficits related to basic COPD self care/management Knowledge deficit related to basic understanding of how to use inhalers and how inhaled medications work Knowledge deficit related to importance of energy conservation Cannot afford prescribed medications Unable to independently self manage COPD Does not adhere to provider recommendations re:  smoking Pt states that she is wears her oxygen at 3-4 L/min.  Denies any cough.  States her O2 sats range from 96-98% when she is resting and goes down when she is moving around. States she gets short of breath with very little exertion.  States she is smoking about 1/2 pack a day and has not cut back as she has had some stress as her dog died and they had a death in the family.  Case Manager Clinical Goal(s): patient will report using inhalers as prescribed  including rinsing mouth after use patient will verbalize understanding of COPD action plan and when to seek appropriate levels of medical care patient will verbalize basic understanding of COPD disease process and self care activities patient will not be hospitalized for COPD exacerbation as evidenced  Interventions:  Collaboration with [name], [name], MD regarding development and update of comprehensive plan of care as evidenced by provider attestation and co-signature Inter-disciplinary care team collaboration (see longitudinal plan of care) Reviewed COPD education on self care/management/and exacerbation prevention  Reviewed COPD action plan and reinforced importance of daily self assessment Reinforced proper use of medications used for management of COPD including inhalers Reinforced to patient to self assesses COPD  action plan zone and make appointment with provider if in the yellow zone for 48 hours without improvement. Reinforced education about and advised patient to utilize infection prevention strategies to reduce risk of respiratory infection  Reinforced to take an extra oxygen tank with her when she goes out and to make sure the tanks are full Reviewed importance of good nutrition and use of nutritional supplements to help maintain her weight Referral to CCM pharmacist for cost of medications and need for nicotine patches for smoking cessation-visit scheduled for 11/17/20 Referral to CCM LCSW for  counseling and assistance in finding new psychiatrist-pending outreach Self-Care Activities:  Patient verbalizes understanding of plan to self manage COPD Self administers medications as prescribed Attends all scheduled provider appointments Calls pharmacy for medication refills Calls provider office for new concerns or questions Patient Goals: - eat healthy - get at least 7 to 8 hours of sleep at night - get outdoors every day (weather permitting) - keep room cool and  dark - limit daytime naps - practice relaxation or meditation daily - use a fan or white noise in bedroom - use devices that will help like a cane, sock-puller or reacher - develop a rescue plan - eliminate symptom triggers at home - follow rescue plan if symptoms flare-up - keep follow-up appointments - use an extra pillow to sleep - eliminate smoking in my home - identify and remove indoor air pollutants - limit outdoor activity during cold weather - listen for public air quality announcements every day -take extra oxygen tank when going to appointments Follow Up Plan: Telephone follow up appointment with care management team member scheduled for: 11/27/20 at 3:30 PM The patient has been provided with contact information for the care management team and has been advised to call with any health related questions or concerns.       Plan:Telephone follow up appointment with care management team member scheduled for:  11/27/20 and The patient has been provided with contact information for the care management team and has been advised to call with any health related questions or concerns.  Peter Garter RN, Jackquline [place], CDE Care Management Coordinator [facility] [phone], Mobile [phone]

## 2020-11-03 NOTE — Patient Instructions (Signed)
Visit Information  PATIENT GOALS:  Goals Addressed             This Visit's Progress    RNCM:Track and Manage Fluids and Swelling-Heart Failure   On track    Timeframe:  Long-Range Goal Priority:  High Start Date:    09/26/20                         Expected End Date:  02/04/21                     Follow Up Date 11/27/20    - call office if I gain more than 2 pounds in one day or 5 pounds in one week - do ankle pumps when sitting - keep legs up while sitting - track weight in diary - use salt in moderation - watch for swelling in feet, ankles and legs every day - weigh myself daily    Why is this important?   It is important to check your weight daily and watch how much salt and liquids you have.  It will help you to manage your heart failure.    Notes:      RNCM:Track and Manage My Symptoms-COPD   On track    Timeframe:  Long-Range Goal Priority:  Medium Start Date:       09/26/20                      Expected End Date:   02/04/21                    Follow Up Date 11/27/20    - develop a rescue plan - eliminate symptom triggers at home - follow rescue plan if symptoms flare-up - keep follow-up appointments - use an extra pillow to sleep -take extra oxygen tank when going out     Why is this important?   Tracking your symptoms and other information about your health helps your doctor plan your care.  Write down the symptoms, the time of day, what you were doing and what medicine you are taking.  You will soon learn how to manage your symptoms.     Notes:         The patient verbalized understanding of instructions, educational materials, and care plan provided today and declined offer to receive copy of patient instructions, educational materials, and care plan.   Telephone follow up appointment with care management team member scheduled for: 11/27/20 at 3: 71 PM  Peter Garter RN, [facility], CDE Care Management Coordinator [facility] [phone], Mobile [phone]

## 2020-11-07 ENCOUNTER — Telehealth: Payer: Self-pay | Admitting: Pharmacist

## 2020-11-07 NOTE — Chronic Care Management (AMB) (Signed)
Chronic Care Management Pharmacy Assistant   Name: Emylia Latella  MRN: [id number] DOB: [birth year]  Reason for Encounter: Chart review for initial visit with Jeni Salles Clinical Pharmacist on 11-14-20 at 2:45 in office.   Conditions to be addressed/monitored: CHF, HTN, HLD, COPD, Anxiety, and Depression   Recent office visits:  11-03-2020 Dimitri Ped, RN - Patient presented for CHF, HTN, COPD, Chronic Pain, Anxiety and Depression. No medication changes.  09-26-2020 Dimitri Ped, RN - Patient presented for CHF, HTN, COPD, Chronic Pain, Anxiety and Depression. No medication changes.  08-22-2020 [name], [name], MD - Patient presented for Hospital discharge follow up and other concerns. No medication changes.  Recent consult visits:  09-25-2020 Verta Ellen., NP (Cardiology) - Patient presented for Chronic combined systolic (congestive) and diastolic (congestive) heart failure and other concerns. Prescribed Ezetimbie  Changed Torsemide to 20 mg once daily and Stopped Fluticasone.  09-12-2020 Tanda Rockers, MD (Pulmonology) - Patient presented for COPD  GOLD II / still smoking, and other concerns. Changed Albuterol to Inhaler, Decreased Nicotine patch to 7 mg & Stopped Dulera  09-07-2020 Howard,Monisha S RN [facility]) - Patient presented for Palliative care. No medication changes.  08-23-2020 Tanda Rockers, MD (Pulmonology) - Patient presented for COPD  GOLD II / still smoking, and other concerns. No medication changes.  08-09-2020 Magdalen Spatz, NP (Pulmonology) - Patient presented for Acute on chronic combined systolic and diastolic CHF (congestive heart failure) and other concerns. Prescribed Budeson-Glycopyrrol-Formoterol.   07-26-2020 Courtney Heys, MD (Physical Med) - Patient presented for COPD exacerbation. No medication changes.  Hospital visits:  Medication Reconciliation was completed by comparing discharge summary, patient's EMR and Pharmacy  list, and upon discussion with patient.   Admitted to the hospital on 07/26/20 due to Acute on chronic respiratory failure with Hypoxia and Hypercapnia. Discharge date was 08/01/20. Discharged from [facility]?Medications Started at [facility] Discharge:?? -started empagliflozin, mometasone- formoterol, nicotine and torsemide.   Medication Changes at Hospital Discharge: -Changed mirtazipine, oxycodone, pantoprazole and pregabalin.    Medications Discontinued at Hospital Discharge: -Stopped None.    Medications that remain the same after Hospital Discharge:??  -All other medications will remain the same.      Medications: Outpatient Encounter Medications as of 11/07/2020  Medication Sig   albuterol (PROAIR HFA) 108 (90 Base) MCG/ACT inhaler Inhale 2 puffs into the lungs every 4 (four) hours as needed for wheezing or shortness of breath (cough).   ALPRAZolam (XANAX) 0.5 MG tablet TAKE 1 TABLET BY MOUTH TWICE DAILY AS NEEDED FOR ANXIETY.   aspirin EC 81 MG tablet Take 81 mg by mouth daily.   Budeson-Glycopyrrol-Formoterol (BREZTRI AEROSPHERE) 160-9-4.8 MCG/ACT AERO Inhale 2 puffs into the lungs in the morning and at bedtime.   DULoxetine (CYMBALTA) 60 MG capsule Take 1 capsule (60 mg total) by mouth 2 (two) times daily.   empagliflozin (JARDIANCE) 10 MG TABS tablet Take 1 tablet (10 mg total) by mouth daily.   ezetimibe (ZETIA) 10 MG tablet Take 1 tablet (10 mg total) by mouth daily.   levothyroxine (SYNTHROID) 50 MCG tablet TAKE 1 TABLET IN THE MORNING BEFORE BREAKFAST.   midodrine (PROAMATINE) 5 MG tablet Take 1 tablet (5 mg total) by mouth 3 (three) times daily with meals.   mirtazapine (REMERON) 15 MG tablet TAKE (1) TABLET BY MOUTH AT BEDTIME.   nicotine (NICODERM CQ - DOSED IN MG/24 HR) 7 mg/24hr patch Place 1 patch (  7 mg total) onto the skin daily.   OVER THE COUNTER MEDICATION Take 1 capsule by mouth daily. Amberen Multi-Symptom Menopause Relief   oxyCODONE  (OXY IR/ROXICODONE) 5 MG immediate release tablet TAKE (1) TABLET BY MOUTH EVERY SIX HOURS AS NEEDED FOR SEVERE PAIN.   pantoprazole (PROTONIX) 40 MG tablet TAKE ONE TABLET BY MOUTH TWICE DAILY BEFORE A MEAL.   pregabalin (LYRICA) 50 MG capsule TAKE 1 CAPSULE BY MOUTH IN THEMORNING AND 2 CAPSULES AT BEDTIME.   QUEtiapine (SEROQUEL) 50 MG tablet TAKE 1 OR 2 TABLETS BY MOUTH AT BEDTIME   torsemide (DEMADEX) 20 MG tablet Take 1 tablet (20 mg total) by mouth daily.   UNABLE TO FIND Med Name: OTC Amberen (for hot flashes)   No facility-administered encounter medications on file as of 11/07/2020.  Fill History : ALBUTEROL SUL HFA 12/06/2019 17   ALPRAZOLAM 0.5 MG 10/11/2020 30   SYMBICORT 160/4.5 12/08/2018 30   DULOXETINE HCL DR 10/30/2020 30   JARDIANCE  10 MG TABS 10/31/2020 30   EZETIMIBE  10 MG TABS 10/31/2020 30   LEVOTHYROXINE 50MC 11/03/2020 30   MIDODRINE HCL 5 MG 10/30/2020 30   MIRTAZAPINE 15MG  T 10/25/2020 30   OXYCODONE 5MG  TABS 10/12/2020 30   PANTOPRAZOLE DR 40 09/16/2020 90   PREGABALIN 50 MG C 10/12/2020 30   QUETIAPINE FUMARAT 10/12/2020 30   TORSEMIDE  20 MG TABS 09/25/2020 90    Have you seen any other providers since your last visit? **Patient reports no  Any changes in your medications or health? Patient report none  Any side effects from any medications? Patient reports she has experienced some dizziness but she does not think that it is related to any of her medications.  Do you have an symptoms or problems not managed by your medications? Patient reports that her Duloxetine is not working as well for her anymore, she reports she has been on it for a long time and thinks that her system is used to it.  Any concerns about your health right now? Patient reports that she has to use oxygen tanks to carry with her when she goes out, wants to know if there is a way to use a different kind of machine that will allow her more time to be out.  Has your provider  asked that you check blood pressure, blood sugar, or follow special diet at home? Patient reports she was asked to check her pressures at home, she has a wist and manual cuff, she reports she has had a death in the family and her dog passed recently so she has not been checking as often as she should. She reports that she will check  once a week prior to her appointment to have readings to bring with her.  Do you get any type of exercise on a regular basis? Patient reports she is walking does housework and enjoys going out to sit on her porch.  Can you think of a goal you would like to reach for your health? None at this time  Do you have any problems getting your medications? Patient reports that she is happy with her pharmacy but does have issues with the cost of some of her medications being affordable.  Is there anything that you would like to discuss during the appointment? Patient reports she would like to be referred to a psychiatrist or someone whom can assist her with her depression to talk to and manage her medication for it. She also  notes that she feels like she is a burden on her kids having to taker her places and appointments and with the oxygen being a factor does not leave a lot of time for her to just visit with them they each live 45 min away from her. Her dog that passed also helped her with her depression.  Patient made aware to bring blood pressure cuff, medications that don't need refrigeration and supplements to appointment  Care Gaps: BP- 128/64 TDAP - Overdue PAP Smear - Overdue Zoster Vaccines - Overdue COVID Booster #3 Therapist, music) - Overdue Flu Vaccine - Overdue AWV - MSG sent to Ramond Craver CMA to schedule.   Star Rating Drugs: Empagliflozin (Jardiance ) 10 mg - Last filled 10-31-2020 30 [facility] Pharmacist Assistant [phone]

## 2020-11-13 ENCOUNTER — Telehealth: Payer: Self-pay | Admitting: Pharmacist

## 2020-11-13 NOTE — Chronic Care Management (AMB) (Signed)
    Chronic Care Management Pharmacy Assistant   Name: [name]  MRN: [id number] DOB: Aug 22, 1955  11-13-2020 APPOINTMENT REMINDER   Called [name], No answer, left message of appointment on 11-14-2020 at 2:45 via office visit with Jeni Salles, Pharm D. Notified to have all medications, supplements, blood pressure and/or blood sugar logs available during appointment and to return call if need to reschedule.   Care Gaps: BP- 128/64 TDAP - Overdue PAP Smear - Overdue Zoster Vaccines - Overdue COVID Booster #3 Therapist, music) - Overdue Flu Vaccine - Overdue AWV - MSG sent to Ramond Craver CMA to schedule  Star Rating Drug: Empagliflozin (Jardiance ) 10 mg - Last filled 10-31-2020 30 [facility]  Any gaps in medications fill history? None   Medications: Outpatient Encounter Medications as of 11/13/2020  Medication Sig   albuterol (PROAIR HFA) 108 (90 Base) MCG/ACT inhaler Inhale 2 puffs into the lungs every 4 (four) hours as needed for wheezing or shortness of breath (cough).   ALPRAZolam (XANAX) 0.5 MG tablet TAKE 1 TABLET BY MOUTH TWICE DAILY AS NEEDED FOR ANXIETY.   aspirin EC 81 MG tablet Take 81 mg by mouth daily.   Budeson-Glycopyrrol-Formoterol (BREZTRI AEROSPHERE) 160-9-4.8 MCG/ACT AERO Inhale 2 puffs into the lungs in the morning and at bedtime.   DULoxetine (CYMBALTA) 60 MG capsule Take 1 capsule (60 mg total) by mouth 2 (two) times daily.   empagliflozin (JARDIANCE) 10 MG TABS tablet Take 1 tablet (10 mg total) by mouth daily.   ezetimibe (ZETIA) 10 MG tablet Take 1 tablet (10 mg total) by mouth daily.   levothyroxine (SYNTHROID) 50 MCG tablet TAKE 1 TABLET IN THE MORNING BEFORE BREAKFAST.   midodrine (PROAMATINE) 5 MG tablet Take 1 tablet (5 mg total) by mouth 3 (three) times daily with meals.   mirtazapine (REMERON) 15 MG tablet TAKE (1) TABLET BY MOUTH AT BEDTIME.   nicotine (NICODERM CQ - DOSED IN MG/24 HR) 7 mg/24hr patch Place 1 patch (7  mg total) onto the skin daily.   OVER THE COUNTER MEDICATION Take 1 capsule by mouth daily. Amberen Multi-Symptom Menopause Relief   oxyCODONE (OXY IR/ROXICODONE) 5 MG immediate release tablet TAKE (1) TABLET BY MOUTH EVERY SIX HOURS AS NEEDED FOR SEVERE PAIN.   pantoprazole (PROTONIX) 40 MG tablet TAKE ONE TABLET BY MOUTH TWICE DAILY BEFORE A MEAL.   pregabalin (LYRICA) 50 MG capsule TAKE 1 CAPSULE BY MOUTH IN THEMORNING AND 2 CAPSULES AT BEDTIME.   QUEtiapine (SEROQUEL) 50 MG tablet TAKE 1 OR 2 TABLETS BY MOUTH AT BEDTIME   torsemide (DEMADEX) 20 MG tablet Take 1 tablet (20 mg total) by mouth daily.   UNABLE TO FIND Med Name: OTC Amberen (for hot flashes)   No facility-administered encounter medications on file as of 11/13/2020.   [facility] Clinical Pharmacist Assistant [phone]

## 2020-11-14 ENCOUNTER — Other Ambulatory Visit: Payer: Self-pay

## 2020-11-14 ENCOUNTER — Ambulatory Visit (INDEPENDENT_AMBULATORY_CARE_PROVIDER_SITE_OTHER): Payer: Medicare Other | Admitting: Pharmacist

## 2020-11-14 DIAGNOSIS — E782 Mixed hyperlipidemia: Secondary | ICD-10-CM

## 2020-11-14 DIAGNOSIS — I10 Essential (primary) hypertension: Secondary | ICD-10-CM

## 2020-11-14 NOTE — Progress Notes (Signed)
Chronic Care Management Pharmacy Note  12/03/2020 Name:  [name] MRN:  [id number] DOB:  1955/05/18  Summary: BP is low and pt is not checking consistently at home LDL not at goal < 70  Recommendations/Changes made from today's visit: -Recommended at least weekly monitoring of BP at home -Recommend repeat TSH and adjustment of levothyroxine dose -Provided behavioral health telephone number -Provided QUIT line phone number to request patches/lozenges  Plan: BP assessment in 1-2 months   Subjective: [name] is an 65 y.o. year old female who is a primary patient of [name], [name], MD.  The CCM team was consulted for assistance with disease management and care coordination needs.    Engaged with patient face to face for initial visit in response to provider referral for pharmacy case management and/or care coordination services.   Consent to Services:  The patient was given the following information about Chronic Care Management services today, agreed to services, and gave verbal consent: 1. CCM service includes personalized support from designated clinical staff supervised by the primary care provider, including individualized plan of care and coordination with other care providers 2. 24/7 contact phone numbers for assistance for urgent and routine care needs. 3. Service will only be billed when office clinical staff spend 20 minutes or more in a month to coordinate care. 4. Only one practitioner may furnish and bill the service in a calendar month. 5.The patient may stop CCM services at any time (effective at the end of the month) by phone call to the office staff. 6. The patient will be responsible for cost sharing (co-pay) of up to 20% of the service fee (after annual deductible is met). Patient agreed to services and consent obtained.  Patient Care Team: [name], [name], MD as PCP - General (Internal Medicine) Satira Sark, MD as PCP -  Cardiology (Cardiology) Rothbart, Cristopher Estimable, MD (Inactive) (Cardiology) Danie Binder, MD (Inactive) as Consulting Physician (Gastroenterology) Dimitri Ped, RN as Case Manager Viona Gilmore, [facility] as Pharmacist (Pharmacist) Saporito, Maree Erie, LCSW as Social Worker (Licensed Clinical Social Worker)  Recent office visits: 11-03-2020 Dimitri Ped, RN - Patient presented for CHF, HTN, COPD, Chronic Pain, Anxiety and Depression. No medication changes.   09-26-2020 Dimitri Ped, RN - Patient presented for CHF, HTN, COPD, Chronic Pain, Anxiety and Depression. No medication changes.   08-22-2020 [name], [name], MD - Patient presented for Hospital discharge follow up and other concerns. No medication changes.  Recent consult visits: 09-25-2020 Verta Ellen., NP (Cardiology) - Patient presented for Chronic combined systolic (congestive) and diastolic (congestive) heart failure and other concerns. Prescribed Ezetimbie  Changed Torsemide to 20 mg once daily and Stopped Fluticasone.   09-12-2020 Tanda Rockers, MD (Pulmonology) - Patient presented for COPD  GOLD II / still smoking, and other concerns. Changed Albuterol to Inhaler, Decreased Nicotine patch to 7 mg & Stopped Dulera   09-07-2020 Howard,Monisha S RN [facility]) - Patient presented for Palliative care. No medication changes.   08-23-2020 Tanda Rockers, MD (Pulmonology) - Patient presented for COPD  GOLD II / still smoking, and other concerns. No medication changes.   08-09-2020 Magdalen Spatz, NP (Pulmonology) - Patient presented for Acute on chronic combined systolic and diastolic CHF (congestive heart failure) and other concerns. Prescribed Budeson-Glycopyrrol-Formoterol.    07-26-2020 Courtney Heys, MD (Physical Med) - Patient presented for COPD exacerbation. No medication changes.  Hospital visits: Medication Reconciliation was  completed by comparing discharge summary, patient's EMR and Pharmacy list, and  upon discussion with patient.   Admitted to the hospital on 07/26/20 due to Acute on chronic respiratory failure with Hypoxia and Hypercapnia. Discharge date was 08/01/20. Discharged from [place]?Medications Started at [facility] Discharge:?? -started empagliflozin, mometasone- formoterol, nicotine and torsemide.   Medication Changes at Hospital Discharge: -Changed mirtazipine, oxycodone, pantoprazole and pregabalin.    Medications Discontinued at Hospital Discharge: -Stopped None.    Medications that remain the same after Hospital Discharge:??  -All other medications will remain the same.     Objective:  Lab Results  Component Value Date   CREATININE 1.30 (H) 10/05/2020   BUN 20 10/05/2020   GFR 35.05 (L) 08/22/2020   GFRNONAA 43 (L) 08/01/2020   GFRAA 49 (L) 06/11/2019   NA 140 10/05/2020   K 3.7 10/05/2020   CALCIUM 10.6 (H) 10/05/2020   CO2 34 (H) 10/05/2020   GLUCOSE 83 10/05/2020    Lab Results  Component Value Date/Time   GFR 35.05 (L) 08/22/2020 04:05 PM   GFR 52.48 (L) 12/08/2018 03:36 PM    Last diabetic Eye exam: No results found for: HMDIABEYEEXA  Last diabetic Foot exam: No results found for: HMDIABFOOTEX   Lab Results  Component Value Date   CHOL 129 07/28/2020   HDL 28 (L) 07/28/2020   LDLCALC 79 07/28/2020   TRIG 108 07/28/2020   CHOLHDL 4.6 07/28/2020    Hepatic Function Latest Ref Rng & Units 08/22/2020 07/26/2020 06/11/2019  Total Protein 6.0 - 8.3 g/dL 7.4 6.5 5.8(L)  Albumin 3.5 - 5.2 g/dL 4.1 3.3(L) 2.7(L)  AST 0 - 37 U/L 19 39 13(L)  ALT 0 - 35 U/L _0 Alk Phosphatase 39 - 117 U/L 78 135(H) 90  Total Bilirubin 0.2 - 1.2 mg/dL 0.6 2.1(H) 0.6  Bilirubin, Direct 0.00 - 0.40 mg/dL - - -    Lab Results  Component Value Date/Time   TSH 1.240 11/17/2020 02:10 PM   TSH 0.336 (L) 07/28/2020 01:25 AM   TSH 3.220 05/31/2019 11:52 AM   FREET4 0.87 07/28/2020 10:57 AM    CBC Latest Ref Rng & Units 08/22/2020  07/31/2020 07/27/2020  WBC 4.0 - 10.5 K/uL 10.2 10.4 7.9  Hemoglobin 12.0 - 15.0 g/dL 13.0 11.9(L) 13.2  Hematocrit 36.0 - 46.0 % 42.0 43.4 47.5(H)  Platelets 150.0 - 400.0 K/uL 742.0 Repeated and verified X2.(H) 254 376    No results found for: VD25OH  Clinical ASCVD: Yes  The ASCVD Risk score (Arnett DK, et al., 2019) failed to calculate for the following reasons:   The valid total cholesterol range is 130 to 320 mg/dL    Depression screen [facility] 2/9 09/26/2020 07/26/2020 04/07/2020  Decreased Interest 0 1 1  Down, Depressed, Hopeless _1 PHQ - 2 Score _2 Altered sleeping - - -  Tired, decreased energy - - -  Change in appetite - - -  Feeling bad or failure about yourself  - - -  Trouble concentrating - - -  Moving slowly or fidgety/restless - - -  Suicidal thoughts - - -  PHQ-9 Score - - -  Difficult doing work/chores - - -  Some recent data might be hidden     Social History   Tobacco Use  Smoking Status Every Day   Packs/day: 0.25   Years: 40.00   Pack years: 10.00   Types: Cigarettes  Start date: 09/06/1972  Smokeless Tobacco Never  Tobacco Comments   Patient has 1-2 cigs a day and trying to quit.    BP Readings from Last 3 Encounters:  11/23/20 98/60  11/17/20 (!) 161/84  09/25/20 128/64   Pulse Readings from Last 3 Encounters:  11/23/20 70  11/17/20 85  09/25/20 89   Wt Readings from Last 3 Encounters:  11/23/20 113 lb 6.4 oz (51.4 kg)  11/17/20 113 lb 12.8 oz (51.6 kg)  09/25/20 109 lb 6.4 oz (49.6 kg)   BMI Readings from Last 3 Encounters:  11/23/20 20.74 kg/m  11/17/20 20.81 kg/m  09/25/20 20.01 kg/m    Assessment/Interventions: Review of patient past medical history, allergies, medications, health status, including review of consultants reports, laboratory and other test data, was performed as part of comprehensive evaluation and provision of chronic care management services.   SDOH:  (Social Determinants of Health) assessments and  interventions performed: Yes SDOH Interventions    Flowsheet Row Most Recent Value  SDOH Interventions   Financial Strain Interventions Other (Comment)  [working on PAPs]      SDOH Screenings   Alcohol Screen: Not on file  Depression (PHQ2-9): Low Risk    PHQ-2 Score: 1  Financial Resource Strain: Medium Risk   Difficulty of Paying Living Expenses: Somewhat hard  Food Insecurity: No Food Insecurity   Worried About Charity fundraiser in the Last Year: Never true   Ran Out of Food in the Last Year: Never true  Housing: Low Risk    Last Housing Risk Score: 0  Physical Activity: Not on file  Social Connections: Not on file  Stress: Not on file  Tobacco Use: High Risk   Smoking Tobacco Use: Every Day   Smokeless Tobacco Use: Never   Passive Exposure: Not on file  Transportation Needs: No Transportation Needs   Lack of Transportation (Medical): No   Lack of Transportation (Non-Medical): No   Patient reports her biggest concerns right now is that her knee is swelling but she has been keeping ice on it and is unable to use salon pas because this made her break out. She is also working on getting a psychiatrist as she wants one to help better manage her depression. She recently had a dog that passed away who was also helping with her depression. She is currently trying out a german shepherd in hopes that this will help some.  Patient reports she is having issues with affording some of her medications right now.  CCM Care Plan  Allergies  Allergen Reactions   Mirtazapine Other (See Comments)    halucinations   Acetaminophen-Codeine Nausea And Vomiting   Aspirin Nausea And Vomiting    (can take coated aspirin)   Astelin [Azelastine]    Bactrim [Sulfamethoxazole-Trimethoprim]    Celecoxib Other (See Comments)    "does the opposite of what it is suppose to do"   Ciprofloxacin    Fentanyl    Levaquin [Levofloxacin In D5w] Itching   Lipitor [Atorvastatin] Other (See Comments)     Myalgias    Neomycin    Salicylates    Sulfa Antibiotics Nausea And Vomiting   Adhesive [Tape] Rash   Latex Rash   Tegretol [Carbamazepine] Rash    Medications Reviewed Today     Reviewed by Dimitri Ped, RN (Registered Nurse) on 11/27/20 at [place] List Status: <None>   Medication Order Taking? Sig Documenting Provider Last Dose Status Informant  albuterol (PROAIR HFA) 108 (90  Base) MCG/ACT inhaler [id number] No Inhale 2 puffs into the lungs every 4 (four) hours as needed for wheezing or shortness of breath (cough). Tanda Rockers, MD Taking Active Self  ALPRAZolam Duanne Moron) 0.5 MG tablet [id number] No TAKE 1 TABLET BY MOUTH TWICE DAILY AS NEEDED FOR ANXIETY. Lovorn, Jinny Blossom, MD Taking Active   aspirin EC 81 MG tablet [id number] No Take 81 mg by mouth daily. [provider] Taking Active Self  Budeson-Glycopyrrol-Formoterol (BREZTRI AEROSPHERE) 160-9-4.8 MCG/ACT AERO [id number] No Inhale 2 puffs into the lungs in the morning and at bedtime. Tanda Rockers, MD Taking Active   DULoxetine (CYMBALTA) 60 MG capsule [id number] No Take 1 capsule (60 mg total) by mouth 2 (two) times daily. Courtney Heys, MD Taking Active Self           Med Note Tori Milks Sep 25, 2020  3:38 PM)    empagliflozin (JARDIANCE) 10 MG TABS tablet [id number] No Take 1 tablet (10 mg total) by mouth daily. Verta Ellen., NP Taking Active   ezetimibe (ZETIA) 10 MG tablet [id number] No Take 1 tablet (10 mg total) by mouth daily. Verta Ellen., NP Taking Active   levothyroxine (SYNTHROID) 50 MCG tablet [id number] No TAKE 1 TABLET IN THE MORNING BEFORE BREAKFAST. Lovorn, Jinny Blossom, MD Taking Active   midodrine (PROAMATINE) 5 MG tablet [id number] No Take 1 tablet (5 mg total) by mouth 3 (three) times daily with meals. Courtney Heys, MD Taking Active Self           Med Note Tori Milks Sep 25, 2020  3:39 PM)    mirtazapine (REMERON) 15 MG tablet [id number]  TAKE (1) TABLET BY MOUTH AT  BEDTIME. Lovorn, Jinny Blossom, MD  Active   nicotine (NICODERM CQ - DOSED IN MG/24 HR) 7 mg/24hr patch [id number] No Place 1 patch (7 mg total) onto the skin daily. Tanda Rockers, MD Taking Active   oxyCODONE (OXY IR/ROXICODONE) 5 MG immediate release tablet [id number] No Take 1 tablet (5 mg total) by mouth every 6 (six) hours as needed for severe pain. Lovorn, Jinny Blossom, MD Taking Active   pantoprazole (PROTONIX) 40 MG tablet [id number] No TAKE ONE TABLET BY MOUTH TWICE DAILY BEFORE A MEAL. Annitta Needs, NP Taking Active   pregabalin (LYRICA) 50 MG capsule [id number] No TAKE 1 CAPSULE BY MOUTH IN [facility] AND 2 CAPSULES AT BEDTIME. Lovorn, Jinny Blossom, MD Taking Active   QUEtiapine (SEROQUEL) 50 MG tablet [id number] No TAKE 1 OR 2 TABLETS BY MOUTH AT BEDTIME Lovorn, Megan, MD Taking Active   torsemide (DEMADEX) 20 MG tablet [id number] No Take 1 tablet (20 mg total) by mouth daily. Verta Ellen., NP Taking Active             Patient Active Problem List   Diagnosis Date Noted   Palliative care by specialist    Acute on chronic respiratory failure with hypoxia and hypercapnia ([place]) 07/26/2020   Acute on chronic combined systolic and diastolic CHF (congestive heart failure) ([place]) 07/26/2020   Idiopathic hypotension 10/08/2019   Encounter for long-term use of opiate analgesic 03/12/2019   IDA (iron deficiency anemia) 02/17/2019   Gastritis and gastroduodenitis 02/17/2019   COPD  GOLD II / still smoking  12/08/2018   Chronic respiratory failure with hypoxia and hypercapnia ([place]) 12/08/2018   Radiculopathy due to lumbar intervertebral disc disorder 10/29/2018   Sciatica of left side 10/29/2018   Anemia    Gastrointestinal hemorrhage  Elevated LFTs    Hypokalemia 08/24/2018   COPD exacerbation ([place]) 03/18/2014   Acute kidney injury ([place]) 03/18/2014   Acute respiratory failure with hypoxia ([place]) 03/18/2014   CAP (community acquired pneumonia) 03/17/2014   Pneumonia 03/17/2014   HTN (hypertension)  12/03/2012   Sepsis ([place]) 02/21/2012   PNA (pneumonia) 02/21/2012   Adrenal insufficiency ([place]) 02/21/2012   Subclavian steal syndrome 02/21/2012   Chronic pain 11/22/2010   Migraine headache 11/22/2010   Gastroesophageal reflux disease 11/22/2010   Goals of care, counseling/discussion 11/22/2010   Cerebrovascular disease 07/24/2010   Anxiety and depression    Hyperlipidemia    Hypothyroid    Cigarette smoker     Immunization History  Administered Date(s) Administered   Fluad Quad(high Dose 65+) 11/19/2018   Influenza Inj Mdck Quad With Preservative 11/10/2017   Influenza,inj,Quad PF,6+ Mos 11/23/2020   PFIZER(Purple Top)SARS-COV-2 Vaccination 04/29/2019, 05/22/2019   Pneumococcal Polysaccharide-23 02/22/2012    Conditions to be addressed/monitored:  Hypertension, Hyperlipidemia, Heart Failure, GERD, COPD, Hypothyroidism, Depression, Anxiety, and Tobacco use  Care Plan : [place]  Updates made by Viona Gilmore, [place] since 12/03/2020 12:00 AM     Problem: Problem: Hypertension, Hyperlipidemia, Heart Failure, GERD, COPD, Hypothyroidism, Depression, Anxiety, and Tobacco use      Long-Range Goal: Patient-Specific Goal   Start Date: 11/14/2020  Expected End Date: 11/14/2021  This Visit's Progress: On track  Priority: High  Note:   Current Barriers:  Unable to independently afford treatment regimen Unable to independently monitor therapeutic efficacy  Pharmacist Clinical Goal(s):  Patient will verbalize ability to afford treatment regimen achieve adherence to monitoring guidelines and medication adherence to achieve therapeutic efficacy through collaboration with PharmD and provider.   Interventions: 1:1 collaboration with [name], [name], MD regarding development and update of comprehensive plan of care as evidenced by provider attestation and co-signature Inter-disciplinary care team collaboration (see longitudinal plan of  care) Comprehensive medication review performed; medication list updated in electronic medical record  Hypotension (BP goal <130/80) -Controlled -Current treatment: Midodrine 5 mg 1 tablet three times daily -Medications previously tried: n/a  -Current home readings: as low as 69-81 (sometimes good and sometimes low) -Current dietary habits: hard to make herself eat -Current exercise habits: walks with dog -Reports hypotensive/hypertensive symptoms -Educated on Importance of home blood pressure monitoring; Proper BP monitoring technique; Symptoms of hypotension and importance of maintaining adequate hydration; -Counseled to monitor BP at home at least weekly, document, and provide log at future appointments -Counseled on diet and exercise extensively Recommended to continue current medication  Hyperlipidemia: (LDL goal < 70) -Not ideally controlled -Current treatment: Ezetimibe 10 mg 1 tablet daily -Medications previously tried: atorvastatin (myalgias)  -Current dietary patterns: fried foods once or twice a week; uses corn oil; tenderloin, cubed steak, occasionally fried chicken;  -Current exercise habits: walks with dog some -Educated on Cholesterol goals;  Benefits of statin for ASCVD risk reduction; Importance of limiting foods high in cholesterol; -Counseled on diet and exercise extensively Recommended to continue current medication  Heart Failure (Goal: manage symptoms and prevent exacerbations) -Controlled -Last ejection fraction: 30-35% (Date: 07/27/20) -HF type: Systolic -NYHA Class: II (slight limitation of activity) -AHA HF Stage: C (Heart disease and symptoms present) -Current treatment: Torsemide 20 mg 1 tablet daily Jardiance 10 mg 1 tablet daily -Medications previously tried: none  -Current home BP/HR readings: does not check consistently -Current dietary habits: little appetite; drinks Mt. Dew and Kool Aid -Current exercise habits: walking with dog -Educated  on Benefits of medications for managing symptoms and prolonging life Importance of weighing daily; if you gain more than 3 pounds in one day or 5 pounds in one week, call cardiologist -Counseled on diet and exercise extensively Recommended to continue current medication  COPD (Goal: control symptoms and prevent exacerbations) -Controlled -Current treatment  Breztri 160-9-4.8 mcg/act 2 puffs twice daily Albuterol HFA as needed -Medications previously tried: Symbicort  -Gold Grade: Gold 4 (FEV1<40%) -Current COPD Classification:  D (high sx, >/=2 exacerbations/yr) -MMRC/CAT score: n/a -Pulmonary function testing: 08/23/20 -Exacerbations requiring treatment in last 6 months: n/a -Patient reports consistent use of maintenance inhaler -Frequency of rescue inhaler use: some days not using at all; sometimes more than once a day -Counseled on Proper inhaler technique; Benefits of consistent maintenance inhaler use When to use rescue inhaler -Assessed patient finances. Apply for Breztri PAP.  Depression/Anxiety (Goal: minimize symptoms) -Not ideally controlled -Current treatment: Duloxetine 60 mg 1 capsule twice daily Alprazolam 0.5 mg 1 tablet twice daily as needed  Quetiapine 50 mg 1 or 2 tablets at bedtime  Mirtazapine 15 mg 1 tablet at bedtime -Medications previously tried/failed: mirtazapine (hallucinations) -PHQ9: 1 -GAD7: n/a -Connected with [place] for mental health support -Educated on Benefits of medication for symptom control Benefits of cognitive-behavioral therapy with or without medication -Recommended to continue current medication  Tobacco use (Goal quit smoking) -Uncontrolled -Previous quit attempts: n/a -Current treatment  Nicotine 21 mg/24 hr 1 patch daily -Patient smokes After 30 minutes of waking -Patient triggers include: stress -On a scale of 1-10, reports MOTIVATION to quit is 9 -On a scale of 1-10, reports CONFIDENCE in quitting is  9 -Provided contact information for [place] Quit Line (1-800-QUIT-NOW) and encouraged patient to reach out to this group for support. -Recommended to go down to step 2 at the end of the month given down to 3 cigarettes per day.  GERD (Goal: minimize symptoms) -Controlled -Current treatment  Pantoprazole 40 mg 1 tablet twice daily before meals -Medications previously tried:  Pepcid, Prilosec   - Consider step down at follow up dependent on smoking cessation status.  Hypothyroidism (Goal: TSH 0.35-4.5) -Uncontrolled -Current treatment  Levothyroxine 50 mcg 1 tablet before breakfast -Medications previously tried: none  -Recommended repeat TSH and adjustment of dose.  Pain (Goal: minimize pain) -Uncontrolled -Current treatment  Oxycodone 5 mg 1 tablet every 6 hours as needed Pregabalin 50 mg 1 capsule in the morning and 2 capsules at bedtime -Medications previously tried: n/a  -Recommended separating from each other and Xanax due to sedative nature.   Health Maintenance -Vaccine gaps: tetanus, shingrix, COVID booster, influenza -Current therapy:  Aspirin 81 mg 1 tablet daily -Educated on Cost vs benefit of each product must be carefully weighed by individual consumer -Patient is satisfied with current therapy and denies issues -Recommended to continue current medication  Patient Goals/Self-Care Activities Patient will:  - take medications as prescribed as evidenced by patient report and record review check blood pressure at least weekly, document, and provide at future appointments target a minimum of 150 minutes of moderate intensity exercise weekly  Follow Up Plan: Telephone follow up appointment with care management team member scheduled for:       Medication Assistance: Application for [facility]  medication assistance program. in process.  Anticipated assistance start date 01/03/21.  See plan of care for additional detail.  Compliance/Adherence/Medication fill history: Care  Gaps: BP- 128/64 TDAP - Overdue PAP Smear - Overdue Zoster Vaccines - Overdue COVID Booster #3 Therapist, music) -  Overdue Flu Vaccine - Overdue AWV - MSG sent to Ramond Craver CMA to schedule  Star-Rating Drugs: Empagliflozin (Jardiance ) 10 mg - Last filled 10-31-2020 30 DS [facility]  Patient's preferred pharmacy is:  [facility], [address] [address] Phone: [phone] Fax: [phone]  Uses pill box? Yes Pt endorses 99% compliance  We discussed: Current pharmacy is preferred with insurance plan and patient is satisfied with pharmacy services Patient decided to: Continue current medication management strategy  Care Plan and Follow Up Patient Decision:  Patient agrees to Care Plan and Follow-up.  Plan: The care management team will reach out to the patient again over the next 30 days.  Jeni Salles, PharmD, [place] Pharmacist [place] at [place]

## 2020-11-14 NOTE — Patient Instructions (Signed)
Hi [name],  It was great to get to meet you in person! Below is a summary of some of the topics we discussed.  Don't forget to:   Try Voltaren (diclofenac) gel for knee pain Call [facility] for counseling [phone] Call quit line [phone] to get some patches and lozenges  Please reach out to me if you have any questions or need anything before our follow up!  Best, Maddie  Jeni Salles, PharmD, [place] [facility] [place]  Visit Information   Goals Addressed   None    Patient Care Plan: RNCM:Cardiovascular disease  (CHF,HTN and HLD)     Problem Identified: Lack of long term self management of Cardiovascular disease  (CHF,HTN and HLD)   Priority: High     Long-Range Goal: Effective long term self management of Cardiovascular disease  (CHF,HTN and HLD   Start Date: 09/26/2020  Expected End Date: 02/04/2021  This Visit's Progress: On track  Recent Progress: On track  Priority: High  Note:   Current Barriers:  Knowledge deficits related to basic heart failure pathophysiology and Cardiovascular disease  (CHF,HTN and HLD) self care management Unable to independently self manage Cardiovascular disease  (CHF,HTN and HLD Does not adhere to provider recommendations re: smoking Financial strain   Denies any swelling, chest pains or increase in shortness of breath.  States she has been weighting every day and her weight was 104 today.  States she has been trying to drink some Ensure a few times a week.  States she saw Dr. Jerilee Hoh last week because she was having some dizziness.  States her B/P has been lower 115-98/60-80.  States she did have a fall with no injury this morning.  States she and her husband have been trying to eat better. Nurse Case Manager Clinical Goal(s):  patient will weigh self daily and record patient will verbalize understanding of Heart Failure Action Plan and when to call doctor patient  will take all Heart Failure mediations as prescribed Interventions:  Collaboration with [name], [name], MD regarding development and update of comprehensive plan of care as evidenced by provider attestation and co-signature Inter-disciplinary care team collaboration (see longitudinal plan of care) Reviewed basic overview and discussion of pathophysiology of Heart Failure Reinforced to follow a  low sodium diet Reinforced Heart Failure Action Plan and when to call provider Reinforced importance of daily weight Reviewed role of diuretics in prevention of fluid overload Reviewed to get up slowly and to drink adequate amounts of fluids Reviewed to use walker if she feels unsteady Self-Care Activities:  Takes Heart Failure Medications as prescribed Weighs daily and record (notifying MD of 3 lb weight gain over night or 5 lb in a week) Verbalizes understanding of and follows CHF Action Plan Adheres to low sodium diet  Patient Goals:  - Take Heart Failure Medications as prescribed - Weigh daily and record (notify MD with 3 lb weight gain over night or 5 lb in a week) - Follow CHF Action Plan - Adhere to low sodium diet - develop a rescue plan - eat more whole grains, fruits and vegetables, lean meats and healthy fats - follow rescue plan if symptoms flare-up - know when to call the doctor - track symptoms and what helps feel better or worse - dress right for the weather, hot or cold - pace activity allowing for rest - track symptoms during activity in diary - call office if I gain more than 2 pounds in  one day or 5 pounds in one week - do ankle pumps when sitting - keep legs up while sitting - track weight in diary - use salt in moderation - watch for swelling in feet, ankles and legs every day - weigh myself daily Follow Up Plan: Telephone follow up appointment with care management team member scheduled for: 01/04/21 at 3:30 PM The patient has been provided with contact  information for the care management team and has been advised to call with any health related questions or concerns.      Patient Care Plan: RNCM:COPD (Adult)     Problem Identified: Symptom Exacerbation (COPD)      Long-Range Goal: Symptom Exacerbation Prevented or Minimized   Start Date: 09/26/2020  Expected End Date: 02/04/2021  This Visit's Progress: On track  Recent Progress: On track  Priority: Medium  Note:   Current Barriers:  Knowledge deficits related to basic understanding of COPD disease process Knowledge deficits related to basic COPD self care/management Knowledge deficit related to basic understanding of how to use inhalers and how inhaled medications work Knowledge deficit related to importance of energy conservation Cannot afford prescribed medications Unable to independently self manage COPD Does not adhere to provider recommendations re:  smoking Pt states that she is wears her oxygen at 3-4 L/min.  States her she gets low on her O2 tanks when she goes out for appointments especially if she has to go to [place].  States they take 2 sometimes 3 tanks with them.Denies any cough.  States her O2 sats range from 96-98% when she is resting and goes down when she is moving around. States she gets short of breath with very little exertion.  States she is smoking about 1/2 pack a day and has not cut back as she has had some stress as her dog died and they had a death in the family.  Case Manager Clinical Goal(s): patient will report using inhalers as prescribed including rinsing mouth after use patient will verbalize understanding of COPD action plan and when to seek appropriate levels of medical care patient will verbalize basic understanding of COPD disease process and self care activities patient will not be hospitalized for COPD exacerbation as evidenced  Interventions:  Collaboration with [name], [name], MD regarding development and update of comprehensive  plan of care as evidenced by provider attestation and co-signature Inter-disciplinary care team collaboration (see longitudinal plan of care) Collaborated with [place] to see what options pt would have for her portable O2 and communicated to Dr. Melvyn Novas their recommendation for a portable oxygen concentrator. Reviewed COPD education on self care/management/and exacerbation prevention  Reviewed COPD action plan and reinforced importance of daily self assessment Reinforced proper use of medications used for management of COPD including inhalers Reinforced to patient to self assesses COPD action plan zone and make appointment with provider if in the yellow zone for 48 hours without improvement. Reinforced education about and advised patient to utilize infection prevention strategies to reduce risk of respiratory infection  Reinforced to take an extra oxygen tank with her when she goes out and to make sure the tanks are full Reinforced importance of good nutrition and use of nutritional supplements to help maintain her weight Referral to CCM LCSW for  counseling and assistance in finding new psychiatrist-pending outreach Self-Care Activities:  Patient verbalizes understanding of plan to self manage COPD Self administers medications as prescribed Attends all scheduled provider appointments Calls pharmacy for medication refills Calls provider office for new  concerns or questions Patient Goals: - eat healthy - get at least 7 to 8 hours of sleep at night - get outdoors every day (weather permitting) - keep room cool and dark - limit daytime naps - practice relaxation or meditation daily - use a fan or white noise in bedroom - use devices that will help like a cane, sock-puller or reacher - develop a rescue plan - eliminate symptom triggers at home - follow rescue plan if symptoms flare-up - keep follow-up appointments - use an extra pillow to sleep - eliminate smoking in my home - identify  and remove indoor air pollutants - limit outdoor activity during cold weather - listen for public air quality announcements every day -take extra oxygen tanks when going to appointments Follow Up Plan: Telephone follow up appointment with care management team member scheduled for: 01/04/21 at 3:30 PM The patient has been provided with contact information for the care management team and has been advised to call with any health related questions or concerns.      Patient Care Plan: CCM Pharmacy Care Plan     Problem Identified: Problem: Hypertension, Hyperlipidemia, Heart Failure, GERD, COPD, Hypothyroidism, Depression, Anxiety, and Tobacco use      Long-Range Goal: Patient-Specific Goal   Start Date: 11/14/2020  Expected End Date: 11/14/2021  This Visit's Progress: On track  Priority: High  Note:   Current Barriers:  Unable to independently afford treatment regimen Unable to independently monitor therapeutic efficacy  Pharmacist Clinical Goal(s):  Patient will verbalize ability to afford treatment regimen achieve adherence to monitoring guidelines and medication adherence to achieve therapeutic efficacy through collaboration with PharmD and provider.   Interventions: 1:1 collaboration with [name], [name], MD regarding development and update of comprehensive plan of care as evidenced by provider attestation and co-signature Inter-disciplinary care team collaboration (see longitudinal plan of care) Comprehensive medication review performed; medication list updated in electronic medical record  Hypotension (BP goal <130/80) -Controlled -Current treatment: Midodrine 5 mg 1 tablet three times daily -Medications previously tried: n/a  -Current home readings: as low as 69-81 (sometimes good and sometimes low) -Current dietary habits: hard to make herself eat -Current exercise habits: walks with dog -Reports hypotensive/hypertensive symptoms -Educated on Importance of  home blood pressure monitoring; Proper BP monitoring technique; Symptoms of hypotension and importance of maintaining adequate hydration; -Counseled to monitor BP at home at least weekly, document, and provide log at future appointments -Counseled on diet and exercise extensively Recommended to continue current medication  Hyperlipidemia: (LDL goal < 70) -Not ideally controlled -Current treatment: Ezetimibe 10 mg 1 tablet daily -Medications previously tried: atorvastatin (myalgias)  -Current dietary patterns: fried foods once or twice a week; uses corn oil; tenderloin, cubed steak, occasionally fried chicken;  -Current exercise habits: walks with dog some -Educated on Cholesterol goals;  Benefits of statin for ASCVD risk reduction; Importance of limiting foods high in cholesterol; -Counseled on diet and exercise extensively Recommended to continue current medication  Heart Failure (Goal: manage symptoms and prevent exacerbations) -Controlled -Last ejection fraction: 30-35% (Date: 07/27/20) -HF type: Systolic -NYHA Class: II (slight limitation of activity) -AHA HF Stage: C (Heart disease and symptoms present) -Current treatment: Torsemide 20 mg 1 tablet daily Jardiance 10 mg 1 tablet daily -Medications previously tried: none  -Current home BP/HR readings: does not check consistently -Current dietary habits: little appetite; drinks Mt. Dew and Kool Aid -Current exercise habits: walking with dog -Educated on Benefits of medications for managing symptoms and prolonging life  Importance of weighing daily; if you gain more than 3 pounds in one day or 5 pounds in one week, call cardiologist -Counseled on diet and exercise extensively Recommended to continue current medication  COPD (Goal: control symptoms and prevent exacerbations) -Controlled -Current treatment  Breztri 160-9-4.8 mcg/act 2 puffs twice daily Albuterol HFA as needed -Medications previously tried: Symbicort  -Gold  Grade: Gold 4 (FEV1<40%) -Current COPD Classification:  D (high sx, >/=2 exacerbations/yr) -MMRC/CAT score: n/a -Pulmonary function testing: 08/23/20 -Exacerbations requiring treatment in last 6 months: n/a -Patient reports consistent use of maintenance inhaler -Frequency of rescue inhaler use: some days not using at all; sometimes more than once a day -Counseled on Proper inhaler technique; Benefits of consistent maintenance inhaler use When to use rescue inhaler -Assessed patient finances. Apply for Breztri PAP.  Depression/Anxiety (Goal: minimize symptoms) -Not ideally controlled -Current treatment: Duloxetine 60 mg 1 capsule twice daily Alprazolam 0.5 mg 1 tablet twice daily as needed  Quetiapine 50 mg 1 or 2 tablets at bedtime  Mirtazapine 15 mg 1 tablet at bedtime -Medications previously tried/failed: mirtazapine (hallucinations) -PHQ9: 1 -GAD7: n/a -Connected with [place] for mental health support -Educated on Benefits of medication for symptom control Benefits of cognitive-behavioral therapy with or without medication -Recommended to continue current medication  Tobacco use (Goal quit smoking) -Uncontrolled -Previous quit attempts: n/a -Current treatment  Nicotine 21 mg/24 hr 1 patch daily -Patient smokes After 30 minutes of waking -Patient triggers include: stress -On a scale of 1-10, reports MOTIVATION to quit is 9 -On a scale of 1-10, reports CONFIDENCE in quitting is 9 -Provided contact information for [facility]uit Line (1-800-QUIT-NOW) and encouraged patient to reach out to this group for support. -Recommended to go down to step 2 at the end of the month given down to 3 cigarettes per day.  GERD (Goal: minimize symptoms) -Controlled -Current treatment  Pantoprazole 40 mg 1 tablet twice daily before meals -Medications previously tried:  Pepcid, Prilosec   - Consider step down at follow up dependent on smoking cessation status.  Hypothyroidism  (Goal: TSH 0.35-4.5) -Uncontrolled -Current treatment  Levothyroxine 50 mcg 1 tablet before breakfast -Medications previously tried: none  -Recommended repeat TSH and adjustment of dose.  Pain (Goal: minimize pain) -Uncontrolled -Current treatment  Oxycodone 5 mg 1 tablet every 6 hours as needed Pregabalin 50 mg 1 capsule in the morning and 2 capsules at bedtime -Medications previously tried: n/a  -Recommended separating from each other and Xanax due to sedative nature.   Health Maintenance -Vaccine gaps: tetanus, shingrix, COVID booster, influenza -Current therapy:  Aspirin 81 mg 1 tablet daily -Educated on Cost vs benefit of each product must be carefully weighed by individual consumer -Patient is satisfied with current therapy and denies issues -Recommended to continue current medication  Patient Goals/Self-Care Activities Patient will:  - take medications as prescribed as evidenced by patient report and record review check blood pressure at least weekly, document, and provide at future appointments target a minimum of 150 minutes of moderate intensity exercise weekly  Follow Up Plan: Telephone follow up appointment with care management team member scheduled for:      Ms. Justin was given information about Chronic Care Management services today including:  CCM service includes personalized support from designated clinical staff supervised by her physician, including individualized plan of care and coordination with other care providers 24/7 contact phone numbers for assistance for urgent and routine care needs. Standard insurance, coinsurance, copays and deductibles apply for chronic care  management only during months in which we provide at least 20 minutes of these services. Most insurances cover these services at 100%, however patients may be responsible for any copay, coinsurance and/or deductible if applicable. This service may help you avoid the need for more expensive  face-to-face services. Only one practitioner may furnish and bill the service in a calendar month. The patient may stop CCM services at any time (effective at the end of the month) by phone call to the office staff.  Patient agreed to services and verbal consent obtained.   The patient verbalized understanding of instructions, educational materials, and care plan provided today and agreed to receive a mailed copy of patient instructions, educational materials, and care plan.  Telephone follow up appointment with pharmacy team member scheduled for: 4 months  Viona Gilmore, [facility]

## 2020-11-17 ENCOUNTER — Encounter: Payer: Self-pay | Admitting: Physical Medicine and Rehabilitation

## 2020-11-17 ENCOUNTER — Other Ambulatory Visit: Payer: Self-pay

## 2020-11-17 ENCOUNTER — Encounter
Payer: Medicare Other | Attending: Physical Medicine and Rehabilitation | Admitting: Physical Medicine and Rehabilitation

## 2020-11-17 VITALS — BP 161/84 | HR 85 | Temp 98.2°F | Ht 62.0 in | Wt 113.8 lb

## 2020-11-17 DIAGNOSIS — M5116 Intervertebral disc disorders with radiculopathy, lumbar region: Secondary | ICD-10-CM | POA: Insufficient documentation

## 2020-11-17 DIAGNOSIS — G894 Chronic pain syndrome: Secondary | ICD-10-CM | POA: Diagnosis present

## 2020-11-17 DIAGNOSIS — J9611 Chronic respiratory failure with hypoxia: Secondary | ICD-10-CM | POA: Diagnosis present

## 2020-11-17 DIAGNOSIS — M5432 Sciatica, left side: Secondary | ICD-10-CM | POA: Diagnosis present

## 2020-11-17 DIAGNOSIS — J9612 Chronic respiratory failure with hypercapnia: Secondary | ICD-10-CM | POA: Insufficient documentation

## 2020-11-17 DIAGNOSIS — E039 Hypothyroidism, unspecified: Secondary | ICD-10-CM | POA: Insufficient documentation

## 2020-11-17 MED ORDER — OXYCODONE HCL 5 MG PO TABS: 5.0000 mg | ORAL_TABLET | Freq: Four times a day (QID) | ORAL | 0 refills | Status: DC | PRN

## 2020-11-17 NOTE — Patient Instructions (Addendum)
Pt is a 65 yr old female with Sciatica and chronic low back pain/lumbar radiculopathy s/p 3 lumbar surgeries in 1990s. Also has severe anxiety- was on Xanax 0.5 mg prior- and was on O2 full time and insomnia and hypothyroidism with increased Synthroid. Has newly Dx'd CHF since most recent hospitalization- still smoking, but not as much-       Will renew Oxycodone- was due to be refilled 11/11/20- is overdue to be refilled- pt said she ran out, but "didn't want to call". Refilled Oxycodone 5 mg q6 hours prn- no refills.   2. Seroquel working for sleep- con't 50-100 mg usually taking 1 tab nightly. - has 3-4 refills left   3. Con't Synthroid for hypothyroidism- will check TSH since has been awhile. Refilled 11/11/20- with 5 refills.    4. Con't Xanax 0.5 mg prn- #60- for anxiety- has 3-4 more refills.   5. Still smoking <10 cigarettes on worst day- rna out of patches- when has patches- DON'T smoke with patches in place- is dangerous!!! Pull patch off and smoke if going to, then wait 2 hours before putting back on- Likes to smoke after eating- goal, to STOP smoking.   6.  F/U in 3 months- call me if any issues!  7. Con't Midodrine- BP usually better when on it- 5 mg TID -call when needs refills.   8. Can try Flonase over the counter if needed for allergic symptoms. - works better if does daily.

## 2020-11-17 NOTE — Progress Notes (Signed)
Subjective:    Patient ID: [name], female    DOB: 27-Apr-1955, 65 y.o.   MRN: [id number]  HPI  1. Pt is a 65 yr old female with Sciatica and chronic low back pain/lumbar radiculopathy s/p 3 lumbar surgeries in 1990s. Also has severe anxiety- was on Xanax 0.5 mg prior- and was on O2 full time and insomnia and hypothyroidism with increased Synthroid.   Here for f/u on chronic pain.   Hasn't been back to hospital since last appointment with me.  Is taking Synthroid 50 mcg daily-   On fluid pill- went to bathroom 4x in 90 minutes.  Cardiology explained that she NEEDS to continue to taking it. Cannot stop- last K+ 3.7 and   Had death in family and dog got killed- wandered away from house and got on road.  Got a german shepherd- staying around the [place]! Got from a family that split up.  Has been run over by dog- 1x only- has "learned".   Was dizzy all morning- BP 161/84 currently.  Sats 93%.  Still taking Midodrine 5 mg TID- not on any other BP meds to bring it down.   Pain- about the same-  Overdid it when dog got killed- carried the dog- finished on knees-  Still taking Oxycodone- is taking 4x/day-       Pain Inventory Average Pain 9 Pain Right Now 7 My pain is sharp and stabbing  In the last 24 hours, has pain interfered with the following? General activity 4 Relation with others 4 Enjoyment of life 8 What TIME of day is your pain at its worst? morning  Sleep (in general) Fair  Pain is worse with: bending and sitting Pain improves with: medication Relief from Meds: 8  Family History  Problem Relation Age of Onset   Colon polyps Mother    Deep vein thrombosis Mother    Heart disease Mother    Hypertension Mother    Bleeding Disorder Mother    Hypertension Sister    Heart disease Brother        before age 13   Hypertension Brother    Social History   Socioeconomic History   Marital status: Married    Spouse name: Not on file   Number of  children: 2   Years of education: Not on file   Highest education level: Not on file  Occupational History    Comment: Disabled  Tobacco Use   Smoking status: Every Day    Packs/day: 0.25    Years: 40.00    Pack years: 10.00    Types: Cigarettes    Start date: 09/06/1972   Smokeless tobacco: Never   Tobacco comments:    Patient has 1-2 cigs a day and trying to quit.   Vaping Use   Vaping Use: Never used  Substance and Sexual Activity   Alcohol use: No    Alcohol/week: 0.0 standard drinks   Drug use: No   Sexual activity: Not Currently    Birth control/protection: Surgical  Other Topics Concern   Not on file  Social History Narrative   Not on file   Social Determinants of Health   Financial Resource Strain: Not on file  Food Insecurity: No Food Insecurity   Worried About Running Out of Food in the Last Year: Never true   Ran Out of Food in the Last Year: Never true  Transportation Needs: No Transportation Needs   Lack of Transportation (Medical): No  Lack of Transportation (Non-Medical): No  Physical Activity: Not on file  Stress: Not on file  Social Connections: Not on file   Past Surgical History:  Procedure Laterality Date   ABDOMINAL HYSTERECTOMY     without oophorectomy for neoplastic disease   BACK SURGERY     BIOPSY  10/22/2018   Procedure: BIOPSY;  Surgeon: Danie Binder, MD;  [facility];  Service: Endoscopy;;  duodenal, gastric   CARDIAC CATHETERIZATION  07/2013   normal coronary arteries   CARPAL TUNNEL RELEASE     Left   CHOLECYSTECTOMY  2007   DILATION AND CURETTAGE OF UTERUS     ESOPHAGOGASTRODUODENOSCOPY (EGD) WITH PROPOFOL N/A 10/22/2018   gastritis (mild reactive gastropathy, negative duodenal biopsies for celiac).    LAPAROTOMY     LEFT HEART CATHETERIZATION WITH CORONARY ANGIOGRAM N/A 07/12/2013   Procedure: LEFT HEART CATHETERIZATION WITH CORONARY ANGIOGRAM;  Surgeon: Leonie Man, MD;  Location: [facility] CATH LAB;  Service:  Cardiovascular;  Laterality: N/A;   LUMBAR LAMINECTOMY  x3   Left; complicated by neurologic dysfunction   NASAL SINUS SURGERY     Past Surgical History:  Procedure Laterality Date   ABDOMINAL HYSTERECTOMY     without oophorectomy for neoplastic disease   BACK SURGERY     BIOPSY  10/22/2018   Procedure: BIOPSY;  Surgeon: Danie Binder, MD;  [facility]: [facility];  Service: Endoscopy;;  duodenal, gastric   CARDIAC CATHETERIZATION  07/2013   normal coronary arteries   CARPAL TUNNEL RELEASE     Left   CHOLECYSTECTOMY  2007   DILATION AND CURETTAGE OF UTERUS     ESOPHAGOGASTRODUODENOSCOPY (EGD) WITH PROPOFOL N/A 10/22/2018   gastritis (mild reactive gastropathy, negative duodenal biopsies for celiac).    LAPAROTOMY     LEFT HEART CATHETERIZATION WITH CORONARY ANGIOGRAM N/A 07/12/2013   Procedure: LEFT HEART CATHETERIZATION WITH CORONARY ANGIOGRAM;  Surgeon: Leonie Man, MD;  Location: [facility] CATH LAB;  Service: Cardiovascular;  Laterality: N/A;   LUMBAR LAMINECTOMY  x3   Left; complicated by neurologic dysfunction   NASAL SINUS SURGERY     Past Medical History:  Diagnosis Date   Anemia    Anxiety and depression    Bronchitis    Chronic pain    COPD (chronic obstructive pulmonary disease) (HCC)    Gastroesophageal reflux disease    History of cardiac catheterization    Normal coronary arteries 2015   Hyperlipidemia    Hypertension    Hypothyroid    Migraine headache    Near syncope    On home oxygen therapy    Started 08/2018   Peripheral vascular disease (HCC)    Pneumonia    Tobacco abuse    1/2 pack per day   Uterine cancer (HCC)    BP (!) 161/84   Pulse 85   Temp 98.2 F (36.8 C) (Oral)   Ht 5\' 2"  (1.575 m)   Wt 113 lb 12.8 oz (51.6 kg)   SpO2 93%   BMI 20.81 kg/m   Opioid Risk Score:   Fall Risk Score:  `1  Depression screen PHQ 2/9  Depression screen [facility] 2/9 09/26/2020 07/26/2020 04/07/2020 09/17/2018  Decreased Interest 0 1 1 0  Down, Depressed, Hopeless  1 1 1  0  PHQ - 2 Score 1 2 2  0  Altered sleeping - - - 0  Tired, decreased energy - - - 0  Change in appetite - - - 1  Feeling bad or failure  about yourself  - - - 0  Trouble concentrating - - - 0  Moving slowly or fidgety/restless - - - 0  Suicidal thoughts - - - 0  PHQ-9 Score - - - 1  Difficult doing work/chores - - - Not difficult at all  Some recent data might be hidden    Review of Systems  Musculoskeletal:  Positive for back pain.       Back going down leg pain  All other systems reviewed and are negative.     Objective:   Physical Exam Awake, alert, appropriate, using O2 by [place]; on 3-4L by portable O2, NAD Color MUCH better; has make up on; and looks SO much better O2 sats 93% on O2 L>R low back pain- not as much in midline- feels "bruised"-  Also sciatia down LLE      Assessment & Plan:    Pt is a 65 yr old female with Sciatica and chronic low back pain/lumbar radiculopathy s/p 3 lumbar surgeries in 1990s. Also has severe anxiety- was on Xanax 0.5 mg prior- and was on O2 full time and insomnia and hypothyroidism with increased Synthroid. Has newly Dx'd CHF since most recent hospitalization- still smoking, but not as much-       Will renew Oxycodone- was due to be refilled 11/11/20- is overdue to be refilled- pt said she ran out, but "didn't want to call". Refilled Oxycodone 5 mg q6 hours prn- no refills.   2. Seroquel working for sleep- con't 50-100 mg usually taking 1 tab nightly. - has 3-4 refills left   3. Con't Synthroid for hypothyroidism- will check TSH since has been awhile. Refilled 11/11/20- with 5 refills.    4. Con't Xanax 0.5 mg prn- #60- for anxiety- has 3-4 more refills.   5. Still smoking <10 cigarettes on worst day- rna out of patches- when has patches- DON'T smoke with patches in place- is dangerous!!! Pull patch off and smoke if going to, then wait 2 hours before putting back on- Likes to smoke after eating- goal, to STOP smoking.   6.  F/U in  3 months- call me if any issues!  7. Con't Midodrine- BP usually better when on it- 5 mg TID -call when needs refills.   I spent a total of 25 minutes on visit- as detailed above- specific smoking counseling down.

## 2020-11-18 LAB — TSH: TSH: 1.24 u[IU]/mL (ref 0.450–4.500)

## 2020-11-20 ENCOUNTER — Telehealth: Payer: Self-pay

## 2020-11-20 NOTE — Telephone Encounter (Signed)
(  11:21 am) SW left a message for patient requesting a call back to schedule a follow-up visit.

## 2020-11-22 ENCOUNTER — Other Ambulatory Visit: Payer: Self-pay

## 2020-11-23 ENCOUNTER — Encounter: Payer: Self-pay | Admitting: Internal Medicine

## 2020-11-23 ENCOUNTER — Ambulatory Visit (INDEPENDENT_AMBULATORY_CARE_PROVIDER_SITE_OTHER): Payer: Medicare Other | Admitting: Internal Medicine

## 2020-11-23 VITALS — BP 98/60 | HR 70 | Temp 98.4°F | Wt 113.4 lb

## 2020-11-23 DIAGNOSIS — J9612 Chronic respiratory failure with hypercapnia: Secondary | ICD-10-CM

## 2020-11-23 DIAGNOSIS — I950 Idiopathic hypotension: Secondary | ICD-10-CM

## 2020-11-23 DIAGNOSIS — J449 Chronic obstructive pulmonary disease, unspecified: Secondary | ICD-10-CM

## 2020-11-23 DIAGNOSIS — J9611 Chronic respiratory failure with hypoxia: Secondary | ICD-10-CM

## 2020-11-23 DIAGNOSIS — Z23 Encounter for immunization: Secondary | ICD-10-CM

## 2020-11-23 NOTE — Progress Notes (Signed)
Established Patient Office Visit     This visit occurred during the SARS-CoV-2 public health emergency.  Safety protocols were in place, including screening questions prior to the visit, additional usage of staff PPE, and extensive cleaning of exam room while observing appropriate contact time as indicated for disinfecting solutions.    CC/Reason for Visit: 35-month follow-up chronic medical conditions  HPI: [name] is a 65 y.o. female who is coming in today for the above mentioned reasons. Past Medical History is significant for: Chronic hypoxemic and hypercapnic respiratory failure due to COPD, chronic combined heart failure, hypothyroidism, depression/anxiety, ongoing nicotine dependence, chronic pain syndrome, orthostatic hypotension on midodrine, PAD.  She feels very sad as within a week she experienced the death of a family member and the traumatic loss of her dog after it was run over by a car chasing after a deer.  She is otherwise doing well.  She is requesting a flu vaccine today.  She will be getting her COVID booster next week.  She has been trying to get in touch with her pulmonologist in regards to her ongoing oxygen prescription as she would like to have a larger tank for weekend trips.   Past Medical/Surgical History: Past Medical History:  Diagnosis Date   Anemia    Anxiety and depression    Bronchitis    Chronic pain    COPD (chronic obstructive pulmonary disease) (HCC)    Gastroesophageal reflux disease    History of cardiac catheterization    Normal coronary arteries 2015   Hyperlipidemia    Hypertension    Hypothyroid    Migraine headache    Near syncope    On home oxygen therapy    Started 08/2018   Peripheral vascular disease ([place])    Pneumonia    Tobacco abuse    1/2 pack per day   Uterine cancer [facility])     Past Surgical History:  Procedure Laterality Date   ABDOMINAL HYSTERECTOMY     without oophorectomy for neoplastic disease   BACK  SURGERY     BIOPSY  10/22/2018   Procedure: BIOPSY;  Surgeon: Danie Binder, MD;  [facility];  Service: Endoscopy;;  duodenal, gastric   CARDIAC CATHETERIZATION  07/2013   normal coronary arteries   CARPAL TUNNEL RELEASE     Left   CHOLECYSTECTOMY  2007   DILATION AND CURETTAGE OF UTERUS     ESOPHAGOGASTRODUODENOSCOPY (EGD) WITH PROPOFOL N/A 10/22/2018   gastritis (mild reactive gastropathy, negative duodenal biopsies for celiac).    LAPAROTOMY     LEFT HEART CATHETERIZATION WITH CORONARY ANGIOGRAM N/A 07/12/2013   Procedure: LEFT HEART CATHETERIZATION WITH CORONARY ANGIOGRAM;  Surgeon: Leonie Man, MD;  Location: [facility] CATH LAB;  Service: Cardiovascular;  Laterality: N/A;   LUMBAR LAMINECTOMY  x3   Left; complicated by neurologic dysfunction   NASAL SINUS SURGERY      Social History:  reports that she has been smoking cigarettes. She started smoking about 48 years ago. She has a 10.00 pack-year smoking history. She has never used smokeless tobacco. She reports that she does not drink alcohol and does not use drugs.  Allergies: Allergies  Allergen Reactions   Mirtazapine Other (See Comments)    halucinations   Acetaminophen-Codeine Nausea And Vomiting   Aspirin Nausea And Vomiting    (can take coated aspirin)   Astelin [Azelastine]    Bactrim [Sulfamethoxazole-Trimethoprim]    Celecoxib Other (See Comments)    "does  the opposite of what it is suppose to do"   Ciprofloxacin    Fentanyl    Levaquin [Levofloxacin In D5w] Itching   Lipitor [Atorvastatin] Other (See Comments)    Myalgias    Neomycin    Salicylates    Sulfa Antibiotics Nausea And Vomiting   Adhesive [Tape] Rash   Latex Rash   Tegretol [Carbamazepine] Rash    Family History:  Family History  Problem Relation Age of Onset   Colon polyps Mother    Deep vein thrombosis Mother    Heart disease Mother    Hypertension Mother    Bleeding Disorder Mother    Hypertension Sister    Heart disease  Brother        before age 27   Hypertension Brother      Current Outpatient Medications:    albuterol (PROAIR HFA) 108 (90 Base) MCG/ACT inhaler, Inhale 2 puffs into the lungs every 4 (four) hours as needed for wheezing or shortness of breath (cough)., Disp: 18 g, Rfl: 11   ALPRAZolam (XANAX) 0.5 MG tablet, TAKE 1 TABLET BY MOUTH TWICE DAILY AS NEEDED FOR ANXIETY., Disp: 60 tablet, Rfl: 5   aspirin EC 81 MG tablet, Take 81 mg by mouth daily., Disp: , Rfl:    Budeson-Glycopyrrol-Formoterol (BREZTRI AEROSPHERE) 160-9-4.8 MCG/ACT AERO, Inhale 2 puffs into the lungs in the morning and at bedtime., Disp: 5.9 g, Rfl: 0   DULoxetine (CYMBALTA) 60 MG capsule, Take 1 capsule (60 mg total) by mouth 2 (two) times daily., Disp: 60 capsule, Rfl: 5   empagliflozin (JARDIANCE) 10 MG TABS tablet, Take 1 tablet (10 mg total) by mouth daily., Disp: 90 tablet, Rfl: 3   ezetimibe (ZETIA) 10 MG tablet, Take 1 tablet (10 mg total) by mouth daily., Disp: 30 tablet, Rfl: 6   levothyroxine (SYNTHROID) 50 MCG tablet, TAKE 1 TABLET IN THE MORNING BEFORE BREAKFAST., Disp: 30 tablet, Rfl: 11   midodrine (PROAMATINE) 5 MG tablet, Take 1 tablet (5 mg total) by mouth 3 (three) times daily with meals., Disp: 90 tablet, Rfl: 5   mirtazapine (REMERON) 15 MG tablet, TAKE (1) TABLET BY MOUTH AT BEDTIME., Disp: 30 tablet, Rfl: 5   nicotine (NICODERM CQ - DOSED IN MG/24 HR) 7 mg/24hr patch, Place 1 patch (7 mg total) onto the skin daily., Disp: 28 patch, Rfl: 2   oxyCODONE (OXY IR/ROXICODONE) 5 MG immediate release tablet, Take 1 tablet (5 mg total) by mouth every 6 (six) hours as needed for severe pain., Disp: 120 tablet, Rfl: 0   pantoprazole (PROTONIX) 40 MG tablet, TAKE ONE TABLET BY MOUTH TWICE DAILY BEFORE A MEAL., Disp: 180 tablet, Rfl: 0   pregabalin (LYRICA) 50 MG capsule, TAKE 1 CAPSULE BY MOUTH IN THEMORNING AND 2 CAPSULES AT BEDTIME., Disp: 90 capsule, Rfl: 5   QUEtiapine (SEROQUEL) 50 MG tablet, TAKE 1 OR 2 TABLETS BY  MOUTH AT BEDTIME, Disp: 60 tablet, Rfl: 5   torsemide (DEMADEX) 20 MG tablet, Take 1 tablet (20 mg total) by mouth daily., Disp: 90 tablet, Rfl: 1  Review of Systems:  Constitutional: Denies fever, chills, diaphoresis, appetite change and fatigue.  HEENT: Denies photophobia, eye pain, redness, hearing loss, ear pain, congestion, sore throat, rhinorrhea, sneezing, mouth sores, trouble swallowing, neck pain, neck stiffness and tinnitus.   Respiratory: Denies  chest tightness. Cardiovascular: Denies chest pain, palpitations and leg swelling.  Gastrointestinal: Denies nausea, vomiting, abdominal pain, diarrhea, constipation, blood in stool and abdominal distention.  Genitourinary: Denies dysuria, urgency, frequency, hematuria,  flank pain and difficulty urinating.  Endocrine: Denies: hot or cold intolerance, sweats, changes in hair or nails, polyuria, polydipsia. Musculoskeletal: Denies myalgias, back pain, joint swelling, arthralgias and gait problem.  Skin: Denies pallor, rash and wound.  Neurological: Denies dizziness, seizures, syncope, weakness, light-headedness, numbness and headaches.  Hematological: Denies adenopathy. Easy bruising, personal or family bleeding history  Psychiatric/Behavioral: Denies suicidal ideation, mood changes, confusion, nervousness, sleep disturbance and agitation    Physical Exam: Vitals:   11/23/20 1536  BP: 98/60  Pulse: 70  Temp: 98.4 F (36.9 C)  TempSrc: Oral  SpO2: 91%  Weight: 113 lb 6.4 oz (51.4 kg)    Body mass index is 20.74 kg/m.   Constitutional: NAD, calm, comfortable, wearing oxygen Eyes: PERRL, lids and conjunctivae normal ENMT: Mucous membranes are moist.  Respiratory: clear to auscultation bilaterally, no wheezing, no crackles. Normal respiratory effort. No accessory muscle use.  Cardiovascular: Regular rate and rhythm, no murmurs / rubs / gallops. No extremity edema.  Psychiatric: Normal judgment and insight. Alert and oriented x 3.  Normal mood.    Impression and Plan:  Need for influenza vaccination  COPD  GOLD II / still smoking   Chronic respiratory failure with hypoxia and hypercapnia (HCC)  Idiopathic hypotension  -Flu vaccine has been administered today, she will get COVID booster at pharmacy next week. -In regards to her COPD it is stable, have urged her to contact Dr. Gustavus Bryant office in regards to concern about oxygen. -Heart failure is compensated and she follows routinely with cardiology.  Time spent: 22 minutes reviewing chart, interviewing and examining patient and formulating plan of care.    Lelon Frohlich, MD [facility]

## 2020-11-23 NOTE — Addendum Note (Signed)
Addended by: Westley Hummer B on: 11/23/2020 04:57 PM   Modules accepted: Orders

## 2020-11-24 ENCOUNTER — Telehealth: Payer: Medicare Other | Admitting: *Deleted

## 2020-11-24 ENCOUNTER — Telehealth: Payer: Self-pay | Admitting: *Deleted

## 2020-11-24 NOTE — Telephone Encounter (Signed)
  Care Management   Follow Up Note   11/24/2020 Name: [name] MRN: [id number] DOB: 05/01/1955   Referred by: [name], [name], MD  Reason for referral : Chronic Care Management in Patient with Hypertension, COPD Gold II with Exacerbation, Chronic Respiratory Failure with Hypoxia and Hypercapnia, Anxiety and Depression.  An unsuccessful telephone outreach was attempted today. The patient was referred to the case management team for assistance with care management and care coordination. A HIPAA compliant message was left on voicemail, including contact information, encouraging patient to return LCSW's call at her earliest convenience.  LCSW will make another initial telephone outreach call attempt to patient within the next 5-7 business days, if a return call is not received in the meantime.  Follow-Up Plan:  Request placed to Care Guides to schedule another initial telephone outreach call with patient.  Nat Christen LCSW Licensed Clinical Social Worker [place] [phone]

## 2020-11-25 ENCOUNTER — Other Ambulatory Visit: Payer: Self-pay | Admitting: Physical Medicine and Rehabilitation

## 2020-11-27 ENCOUNTER — Ambulatory Visit: Payer: Medicare Other

## 2020-11-27 ENCOUNTER — Telehealth: Payer: Self-pay | Admitting: *Deleted

## 2020-11-27 DIAGNOSIS — F17210 Nicotine dependence, cigarettes, uncomplicated: Secondary | ICD-10-CM

## 2020-11-27 DIAGNOSIS — J449 Chronic obstructive pulmonary disease, unspecified: Secondary | ICD-10-CM

## 2020-11-27 DIAGNOSIS — J9621 Acute and chronic respiratory failure with hypoxia: Secondary | ICD-10-CM

## 2020-11-27 DIAGNOSIS — J9622 Acute and chronic respiratory failure with hypercapnia: Secondary | ICD-10-CM

## 2020-11-27 DIAGNOSIS — E782 Mixed hyperlipidemia: Secondary | ICD-10-CM

## 2020-11-27 DIAGNOSIS — I10 Essential (primary) hypertension: Secondary | ICD-10-CM

## 2020-11-27 NOTE — Chronic Care Management (AMB) (Signed)
  Care Management   Note  11/27/2020 Name: Amayrani Bennick MRN: [id number] DOB: [birth year]  [name] is a 65 y.o. year old female who is a primary care patient of [name], [name], MD and is actively engaged with the care management team. I reached out to [name] by phone today to assist with re-scheduling an initial visit with the Licensed Clinical Social Worker  Follow up plan: Unsuccessful telephone outreach attempt made. A HIPAA compliant phone message was left for the patient providing contact information and requesting a return call.  The care management team will reach out to the patient again over the next 7 days.  If patient returns call to provider office, please advise to call Home at [phone].  [place] Management  Direct Dial: [phone]

## 2020-11-27 NOTE — Patient Instructions (Signed)
Visit Information  PATIENT GOALS:  Goals Addressed             This Visit's Progress    RNCM:Track and Manage Fluids and Swelling-Heart Failure   On track    Timeframe:  Long-Range Goal Priority:  High Start Date:    09/26/20                         Expected End Date:  02/04/21                     Follow Up Date 12/1//22    - call office if I gain more than 2 pounds in one day or 5 pounds in one week - do ankle pumps when sitting - keep legs up while sitting - track weight in diary - use salt in moderation - watch for swelling in feet, ankles and legs every day - weigh myself daily    Why is this important?   It is important to check your weight daily and watch how much salt and liquids you have.  It will help you to manage your heart failure.    Notes:      RNCM:Track and Manage My Symptoms-COPD   On track    Timeframe:  Long-Range Goal Priority:  Medium Start Date:       09/26/20                      Expected End Date:   02/04/21                    Follow Up Date 01/04/21    - develop a rescue plan - eliminate symptom triggers at home - follow rescue plan if symptoms flare-up - keep follow-up appointments - use an extra pillow to sleep -take extra oxygen tank when going out     Why is this important?   Tracking your symptoms and other information about your health helps your doctor plan your care.  Write down the symptoms, the time of day, what you were doing and what medicine you are taking.  You will soon learn how to manage your symptoms.     Notes:         The patient verbalized understanding of instructions, educational materials, and care plan provided today and declined offer to receive copy of patient instructions, educational materials, and care plan.   Telephone follow up appointment with care management team member scheduled for: 01/04/21 at 3:30 PM [place], [facility], CDE Care Management Coordinator [facility] [phone], Mobile [phone]

## 2020-11-27 NOTE — Chronic Care Management (AMB) (Signed)
Chronic Care Management   CCM RN Visit Note  11/27/2020 Name: [name] MRN: [id number] DOB: [birth year]  Subjective: [name] is a 65 y.o. year old female who is a primary care patient of [name], [name], MD. The care management team was consulted for assistance with disease management and care coordination needs.    Engaged with patient by telephone for follow up visit in response to provider referral for case management and/or care coordination services.   Consent to Services:  The patient was given information about Chronic Care Management services, agreed to services, and gave verbal consent prior to initiation of services.  Please see initial visit note for detailed documentation.   Patient agreed to services and verbal consent obtained.   Assessment: Review of patient past medical history, allergies, medications, health status, including review of consultants reports, laboratory and other test data, was performed as part of comprehensive evaluation and provision of chronic care management services.   SDOH (Social Determinants of Health) assessments and interventions performed:    CCM Care Plan  Allergies  Allergen Reactions   Mirtazapine Other (See Comments)    halucinations   Acetaminophen-Codeine Nausea And Vomiting   Aspirin Nausea And Vomiting    (can take coated aspirin)   Astelin [Azelastine]    Bactrim [Sulfamethoxazole-Trimethoprim]    Celecoxib Other (See Comments)    "does the opposite of what it is suppose to do"   Ciprofloxacin    Fentanyl    Levaquin [Levofloxacin In D5w] Itching   Lipitor [Atorvastatin] Other (See Comments)    Myalgias    Neomycin    Salicylates    Sulfa Antibiotics Nausea And Vomiting   Adhesive [Tape] Rash   Latex Rash   Tegretol [Carbamazepine] Rash    Outpatient Encounter Medications as of 11/27/2020  Medication Sig   albuterol (PROAIR HFA) 108 (90 Base) MCG/ACT inhaler Inhale 2 puffs into the lungs  every 4 (four) hours as needed for wheezing or shortness of breath (cough).   ALPRAZolam (XANAX) 0.5 MG tablet TAKE 1 TABLET BY MOUTH TWICE DAILY AS NEEDED FOR ANXIETY.   aspirin EC 81 MG tablet Take 81 mg by mouth daily.   Budeson-Glycopyrrol-Formoterol (BREZTRI AEROSPHERE) 160-9-4.8 MCG/ACT AERO Inhale 2 puffs into the lungs in the morning and at bedtime.   DULoxetine (CYMBALTA) 60 MG capsule Take 1 capsule (60 mg total) by mouth 2 (two) times daily.   empagliflozin (JARDIANCE) 10 MG TABS tablet Take 1 tablet (10 mg total) by mouth daily.   ezetimibe (ZETIA) 10 MG tablet Take 1 tablet (10 mg total) by mouth daily.   levothyroxine (SYNTHROID) 50 MCG tablet TAKE 1 TABLET IN THE MORNING BEFORE BREAKFAST.   midodrine (PROAMATINE) 5 MG tablet Take 1 tablet (5 mg total) by mouth 3 (three) times daily with meals.   mirtazapine (REMERON) 15 MG tablet TAKE (1) TABLET BY MOUTH AT BEDTIME.   nicotine (NICODERM CQ - DOSED IN MG/24 HR) 7 mg/24hr patch Place 1 patch (7 mg total) onto the skin daily.   oxyCODONE (OXY IR/ROXICODONE) 5 MG immediate release tablet Take 1 tablet (5 mg total) by mouth every 6 (six) hours as needed for severe pain.   pantoprazole (PROTONIX) 40 MG tablet TAKE ONE TABLET BY MOUTH TWICE DAILY BEFORE A MEAL.   pregabalin (LYRICA) 50 MG capsule TAKE 1 CAPSULE BY MOUTH IN THEMORNING AND 2 CAPSULES AT BEDTIME.   QUEtiapine (SEROQUEL) 50 MG tablet TAKE 1 OR 2 TABLETS BY MOUTH AT BEDTIME  torsemide (DEMADEX) 20 MG tablet Take 1 tablet (20 mg total) by mouth daily.   No facility-administered encounter medications on file as of 11/27/2020.    Patient Active Problem List   Diagnosis Date Noted   Palliative care by specialist    Acute on chronic respiratory failure with hypoxia and hypercapnia ([place]) 07/26/2020   Acute on chronic combined systolic and diastolic CHF (congestive heart failure) ([place]) 07/26/2020   Idiopathic hypotension 10/08/2019   Encounter for long-term use of opiate  analgesic 03/12/2019   IDA (iron deficiency anemia) 02/17/2019   Gastritis and gastroduodenitis 02/17/2019   COPD  GOLD II / still smoking  12/08/2018   Chronic respiratory failure with hypoxia and hypercapnia ([place]) 12/08/2018   Radiculopathy due to lumbar intervertebral disc disorder 10/29/2018   Sciatica of left side 10/29/2018   Anemia    Gastrointestinal hemorrhage    Elevated LFTs    Hypokalemia 08/24/2018   COPD exacerbation ([place]) 03/18/2014   Acute kidney injury ([place]) 03/18/2014   Acute respiratory failure with hypoxia ([place]) 03/18/2014   CAP (community acquired pneumonia) 03/17/2014   Pneumonia 03/17/2014   HTN (hypertension) 12/03/2012   Sepsis ([place]) 02/21/2012   PNA (pneumonia) 02/21/2012   Adrenal insufficiency ([place]) 02/21/2012   Subclavian steal syndrome 02/21/2012   Chronic pain 11/22/2010   Migraine headache 11/22/2010   Gastroesophageal reflux disease 11/22/2010   Goals of care, counseling/discussion 11/22/2010   Cerebrovascular disease 07/24/2010   Anxiety and depression    Hyperlipidemia    Hypothyroid    Cigarette smoker     Conditions to be addressed/monitored:CHF, HTN, and COPD  Care Plan : RNCM:Cardiovascular disease  (CHF,HTN and HLD)  Updates made by Dimitri Ped, RN since 11/27/2020 12:00 AM     Problem: Lack of long term self management of Cardiovascular disease  (CHF,HTN and HLD)   Priority: High     Long-Range Goal: Effective long term self management of Cardiovascular disease  (CHF,HTN and HLD   Start Date: 09/26/2020  Expected End Date: 02/04/2021  This Visit's Progress: On track  Recent Progress: On track  Priority: High  Note:   Current Barriers:  Knowledge deficits related to basic heart failure pathophysiology and Cardiovascular disease  (CHF,HTN and HLD) self care management Unable to independently self manage Cardiovascular disease  (CHF,HTN and HLD Does not adhere to provider recommendations re: smoking Financial strain    Denies any swelling, chest pains or increase in shortness of breath.  States she has been weighting every day and her weight was 104 today.  States she has been trying to drink some Ensure a few times a week.  States she saw Dr. Jerilee Hoh last week because she was having some dizziness.  States her B/P has been lower 115-98/60-80.  States she did have a fall with no injury this morning.  States she and her husband have been trying to eat better. Nurse Case Manager Clinical Goal(s):  patient will weigh self daily and record patient will verbalize understanding of Heart Failure Action Plan and when to call doctor patient will take all Heart Failure mediations as prescribed Interventions:  Collaboration with [name], [name], MD regarding development and update of comprehensive plan of care as evidenced by provider attestation and co-signature Inter-disciplinary care team collaboration (see longitudinal plan of care) Reviewed basic overview and discussion of pathophysiology of Heart Failure Reinforced to follow a  low sodium diet Reinforced Heart Failure Action Plan and when to call provider Reinforced importance of daily weight Reviewed  role of diuretics in prevention of fluid overload Reviewed to get up slowly and to drink adequate amounts of fluids Reviewed to use walker if she feels unsteady Self-Care Activities:  Takes Heart Failure Medications as prescribed Weighs daily and record (notifying MD of 3 lb weight gain over night or 5 lb in a week) Verbalizes understanding of and follows CHF Action Plan Adheres to low sodium diet  Patient Goals:  - Take Heart Failure Medications as prescribed - Weigh daily and record (notify MD with 3 lb weight gain over night or 5 lb in a week) - Follow CHF Action Plan - Adhere to low sodium diet - develop a rescue plan - eat more whole grains, fruits and vegetables, lean meats and healthy fats - follow rescue plan if symptoms flare-up - know  when to call the doctor - track symptoms and what helps feel better or worse - dress right for the weather, hot or cold - pace activity allowing for rest - track symptoms during activity in diary - call office if I gain more than 2 pounds in one day or 5 pounds in one week - do ankle pumps when sitting - keep legs up while sitting - track weight in diary - use salt in moderation - watch for swelling in feet, ankles and legs every day - weigh myself daily Follow Up Plan: Telephone follow up appointment with care management team member scheduled for: 01/04/21 at 3:30 PM The patient has been provided with contact information for the care management team and has been advised to call with any health related questions or concerns.      Care Plan : RNCM:COPD (Adult)  Updates made by Dimitri Ped, RN since 11/27/2020 12:00 AM     Problem: Symptom Exacerbation (COPD)      Long-Range Goal: Symptom Exacerbation Prevented or Minimized   Start Date: 09/26/2020  Expected End Date: 02/04/2021  This Visit's Progress: On track  Recent Progress: On track  Priority: Medium  Note:   Current Barriers:  Knowledge deficits related to basic understanding of COPD disease process Knowledge deficits related to basic COPD self care/management Knowledge deficit related to basic understanding of how to use inhalers and how inhaled medications work Knowledge deficit related to importance of energy conservation Cannot afford prescribed medications Unable to independently self manage COPD Does not adhere to provider recommendations re:  smoking Pt states that she is wears her oxygen at 3-4 L/min.  States her she gets low on her O2 tanks when she goes out for appointments especially if she has to go to [place].  States they take 2 sometimes 3 tanks with them.Denies any cough.  States her O2 sats range from 96-98% when she is resting and goes down when she is moving around. States she gets short of breath  with very little exertion.  States she is smoking about 1/2 pack a day and has not cut back as she has had some stress as her dog died and they had a death in the family.  Case Manager Clinical Goal(s): patient will report using inhalers as prescribed including rinsing mouth after use patient will verbalize understanding of COPD action plan and when to seek appropriate levels of medical care patient will verbalize basic understanding of COPD disease process and self care activities patient will not be hospitalized for COPD exacerbation as evidenced  Interventions:  Collaboration with [name], [name], MD regarding development and update of comprehensive plan of care as evidenced by  provider attestation and co-signature Inter-disciplinary care team collaboration (see longitudinal plan of care) Collaborated with [place] to see what options pt would have for her portable O2 and communicated to Dr. Melvyn Novas their recommendation for a portable oxygen concentrator. Reviewed COPD education on self care/management/and exacerbation prevention  Reviewed COPD action plan and reinforced importance of daily self assessment Reinforced proper use of medications used for management of COPD including inhalers Reinforced to patient to self assesses COPD action plan zone and make appointment with provider if in the yellow zone for 48 hours without improvement. Reinforced education about and advised patient to utilize infection prevention strategies to reduce risk of respiratory infection  Reinforced to take an extra oxygen tank with her when she goes out and to make sure the tanks are full Reinforced importance of good nutrition and use of nutritional supplements to help maintain her weight Referral to CCM LCSW for  counseling and assistance in finding new psychiatrist-pending outreach Self-Care Activities:  Patient verbalizes understanding of plan to self manage COPD Self administers medications as  prescribed Attends all scheduled provider appointments Calls pharmacy for medication refills Calls provider office for new concerns or questions Patient Goals: - eat healthy - get at least 7 to 8 hours of sleep at night - get outdoors every day (weather permitting) - keep room cool and dark - limit daytime naps - practice relaxation or meditation daily - use a fan or white noise in bedroom - use devices that will help like a cane, sock-puller or reacher - develop a rescue plan - eliminate symptom triggers at home - follow rescue plan if symptoms flare-up - keep follow-up appointments - use an extra pillow to sleep - eliminate smoking in my home - identify and remove indoor air pollutants - limit outdoor activity during cold weather - listen for public air quality announcements every day -take extra oxygen tanks when going to appointments Follow Up Plan: Telephone follow up appointment with care management team member scheduled for: 01/04/21 at 3:30 PM The patient has been provided with contact information for the care management team and has been advised to call with any health related questions or concerns.       Plan:Telephone follow up appointment with care management team member scheduled for:  01/04/21 The patient has been provided with contact information for the care management team and has been advised to call with any health related questions or concerns.  Peter Garter RN, Jackquline Denmark, CDE Care Management Coordinator [facility] [phone], Mobile [phone]

## 2020-11-28 NOTE — Chronic Care Management (AMB) (Signed)
  Care Management   Note  11/28/2020 Name: [name] MRN: [id number] DOB: 11/28/1955  [name] is a 65 y.o. year old female who is a primary care patient of [name], [name], MD and is actively engaged with the care management team. I reached out to [name] by phone today to assist with re-scheduling an initial visit with the Licensed Clinical Social Worker  Follow up plan: Unsuccessful telephone outreach attempt made. A HIPAA compliant phone message was left for the patient providing contact information and requesting a return call.  The care management team will reach out to the patient again over the next 7 days.  If patient returns call to provider office, please advise to call [place] at [phone].  [place] Management  Direct Dial: [phone]

## 2020-11-29 ENCOUNTER — Telehealth: Payer: Self-pay

## 2020-11-29 ENCOUNTER — Telehealth: Payer: Medicare Other | Admitting: *Deleted

## 2020-11-29 ENCOUNTER — Other Ambulatory Visit: Payer: Self-pay | Admitting: Physical Medicine and Rehabilitation

## 2020-11-29 ENCOUNTER — Telehealth: Payer: Self-pay | Admitting: *Deleted

## 2020-11-29 ENCOUNTER — Telehealth: Payer: Medicare Other

## 2020-11-29 NOTE — Telephone Encounter (Signed)
  Care Management   Follow Up Note   11/29/2020 Name: [name] MRN: [id number] DOB: 1955/10/26   Referred by: [name], [name], MD  Reason for referral : Chronic Care Management in Patient with Hypertension, COPD Gold II with Exacerbation, Chronic Respiratory Failure with Hypoxia and Hypercapnia, Anxiety and Depression.   An unsuccessful telephone outreach was attempted today. The patient was referred to the case management team for assistance with care management and care coordination. A HIPAA compliant message was left on voicemail, including contact information, encouraging patient to return LCSW's call at her earliest convenience.  LCSW will make another initial telephone outreach call attempt within the next 5-7 business days, if a return call is not received from patient in the meantime.  Follow-Up Plan:  Request placed to Care Gu[facility] to reschedule patient's initial telephone outreach call with LCSW.  Nat Christen LCSW Licensed Clinical Social Worker [place] [phone]

## 2020-11-29 NOTE — Telephone Encounter (Signed)
(  5:30 pm) Follow-up visit scheduled with patient for 12/07/20 @ 1pm with RN/SW team.

## 2020-11-30 ENCOUNTER — Other Ambulatory Visit: Payer: Self-pay

## 2020-11-30 ENCOUNTER — Ambulatory Visit (INDEPENDENT_AMBULATORY_CARE_PROVIDER_SITE_OTHER): Payer: Medicare Other

## 2020-11-30 DIAGNOSIS — R931 Abnormal findings on diagnostic imaging of heart and coronary circulation: Secondary | ICD-10-CM

## 2020-11-30 DIAGNOSIS — I5042 Chronic combined systolic (congestive) and diastolic (congestive) heart failure: Secondary | ICD-10-CM

## 2020-11-30 LAB — ECHOCARDIOGRAM COMPLETE
Area-P 1/2: 13.79 cm2
Calc EF: 50.9 %
S' Lateral: 3.25 cm
Single Plane A2C EF: 62 %
Single Plane A4C EF: 40.3 %

## 2020-11-30 NOTE — Chronic Care Management (AMB) (Signed)
  Care Management   Note  11/30/2020 Name: [name] MRN: [id number] DOB: 10/23/1955  [name] is a 65 y.o. year old female who is a primary care patient of [name], [name], MD and is actively engaged with the care management team. I reached out to [name] by phone today to assist with re-scheduling an initial visit with the Licensed Clinical Social Worker  Follow up plan: Telephone appointment with care management team member scheduled for:12/08/20  [place] Management  Direct Dial: [phone]

## 2020-12-04 DIAGNOSIS — E782 Mixed hyperlipidemia: Secondary | ICD-10-CM | POA: Diagnosis not present

## 2020-12-04 DIAGNOSIS — J449 Chronic obstructive pulmonary disease, unspecified: Secondary | ICD-10-CM

## 2020-12-04 DIAGNOSIS — I10 Essential (primary) hypertension: Secondary | ICD-10-CM | POA: Diagnosis not present

## 2020-12-05 ENCOUNTER — Ambulatory Visit (INDEPENDENT_AMBULATORY_CARE_PROVIDER_SITE_OTHER): Payer: Medicare Other | Admitting: *Deleted

## 2020-12-05 ENCOUNTER — Telehealth: Payer: Medicare Other

## 2020-12-05 ENCOUNTER — Telehealth: Payer: Self-pay | Admitting: Pharmacist

## 2020-12-05 ENCOUNTER — Telehealth: Payer: Self-pay | Admitting: Internal Medicine

## 2020-12-05 DIAGNOSIS — N179 Acute kidney failure, unspecified: Secondary | ICD-10-CM

## 2020-12-05 DIAGNOSIS — J441 Chronic obstructive pulmonary disease with (acute) exacerbation: Secondary | ICD-10-CM

## 2020-12-05 DIAGNOSIS — G894 Chronic pain syndrome: Secondary | ICD-10-CM

## 2020-12-05 DIAGNOSIS — I10 Essential (primary) hypertension: Secondary | ICD-10-CM

## 2020-12-05 DIAGNOSIS — F32A Depression, unspecified: Secondary | ICD-10-CM

## 2020-12-05 DIAGNOSIS — J9612 Chronic respiratory failure with hypercapnia: Secondary | ICD-10-CM

## 2020-12-05 DIAGNOSIS — J449 Chronic obstructive pulmonary disease, unspecified: Secondary | ICD-10-CM

## 2020-12-05 DIAGNOSIS — J9611 Chronic respiratory failure with hypoxia: Secondary | ICD-10-CM

## 2020-12-05 DIAGNOSIS — G458 Other transient cerebral ischemic attacks and related syndromes: Secondary | ICD-10-CM

## 2020-12-05 DIAGNOSIS — I679 Cerebrovascular disease, unspecified: Secondary | ICD-10-CM

## 2020-12-05 DIAGNOSIS — J9601 Acute respiratory failure with hypoxia: Secondary | ICD-10-CM

## 2020-12-05 NOTE — Telephone Encounter (Signed)
Dr. Melvyn Novas is working in [place] this week. Routing to [employer] for her to get Rx printed and signed by Dr. Melvyn Novas. This will need to then be faxed to [employer] for pt at fax number [phone].

## 2020-12-05 NOTE — Patient Instructions (Signed)
Visit Information   PATIENT GOALS/PLAN OF CARE:  Care Plan : LCSW Plan of Care  Updates made by Francis Gaines, LCSW since 12/05/2020 12:00 AM     Problem: Resume and Stick with Counseling to Reduce and Manage Symptoms of Anxiety and Depression.   Priority: High     Goal: Resume and Stick with Counseling to Reduce and Manage Symptoms of Anxiety and Depression.   Start Date: 12/05/2020  Expected End Date: 03/07/2021  This Visit's Progress: On track  Priority: High  Note:   Current Barriers:   Acute Mental Health needs related to Hypertension, COPD Gold II with Exacerbation, Chronic Respiratory Failure with Hypoxia and Hypercapnia, Anxiety and Depression requires Support, Education, Resources, Referrals, Advocacy, and Care Coordination, in order to meet unmet mental health needs. Clinical Goal(s):  Patient will work with LCSW to reduce and manage symptoms of Anxiety and Depression, until established with a community provider.   Patient will increase knowledge and/or ability of:  Coping Skills, Healthy Habits, Self-Management Skills, Stress Reduction, Home Safety and Utilizing [employer] and Resources.   Clinical Interventions:  Assessed patient's previous treatment, needs, coping skills, current treatment, support system and barriers to care. PHQ-2 and PHQ-9 Depression Screening Tool performed and results reviewed with patient. Mindfulness Meditation Strategies, Relaxation Techniques and Deep Breathing Exercises taught and encouraged daily. Active Listening/Reflection utilized, Engineer, petroleum provided, Brief Cognitive Behavioral Therapy initiated, Mental Health Medications reviewed and Compliance discussed, Quality of Sleep assessed and Sleep Hygiene Techniques promoted and Verbalization of Feelings encouraged.  Discussed plans with patient for ongoing care management follow-up and provided patient with direct contact information for care management team. Discussed several  options for long-term counseling based on need and insurance, and verbal consent obtained to place a referral to [facility] for ongoing mental health counseling and supportive services.  Collaboration with Primary Care Physician, Dr. Lelon Frohlich regarding development and update of comprehensive plan of care as evidenced by provider attestation and co-signature. Inter-disciplinary care team collaboration (see longitudinal plan of care). Patient Goals/Self-Care Activities: Begin personal counseling with LCSW, on a weekly/bi-weekly basis, to reduce and manage symptoms of Anxiety and Depression, until established with [place]. Accept all calls from representative with [facility], in an effort to establish ongoing mental health counseling and supportive services. Incorporate into daily practice - relaxation techniques, deep breathing exercises and mindfulness meditation strategies. Continue with compliance of taking prescription medications, try to obtain adequate rest, stay well-hydrated and eat a healthy, well-balanced diet. Contact LCSW directly (# [id number]) if you have questions, need assistance, or if additional social work needs are identified between now and our next scheduled telephone outreach call. Follow-Up:  12/18/2020 at 10:30am     Consent to CCM Services: Ms. Morrow was given information about Chronic Care Management services including:  CCM service includes personalized support from designated clinical staff supervised by her physician, including individualized plan of care and coordination with other care providers 24/7 contact phone numbers for assistance for urgent and routine care needs. Service will only be billed when office clinical staff spend 20 minutes or more in a month to coordinate care. Only one practitioner may furnish and bill the service in a calendar month. The patient may stop CCM services at any time (effective at the end of the month)  by phone call to the office staff. The patient will be responsible for cost sharing (co-pay) of up to 20% of the service fee (after annual deductible is met).  Patient agreed  to services and verbal consent obtained.   Patient verbalizes understanding of instructions provided today and agrees to view in [place].   Telephone follow up appointment with care management team member scheduled for:  12/18/2020 at 10:30am  [place] Licensed Clinical Social Worker [place] [phone]

## 2020-12-05 NOTE — Progress Notes (Signed)
Chronic Care Management Pharmacy Assistant   Name: [name]  MRN: [id number] DOB: 1955/05/14   Reason for Encounter: Disease State / Hypertension Assessment Call    Conditions to be addressed/monitored: HTN  Recent office visits:  11/29/20 - Patient had CCM visit with Social Work  11/27/20 - Patient had CCM visit with Nurse  11/23/20 [name], [name], MD - Patient presented for COPD GOLD II visit. No medication changes.   Recent consult visits:  11/17/20 Courtney Heys, MD (Physical Med) - Patient presented for Chronic pain and other concerns. Changed Oxycodone to 5 mg PRN Q 6hrs.   Hospital visits:  Medication Reconciliation was completed by comparing discharge summary, patient's EMR and Pharmacy list, and upon discussion with patient.   Admitted to the hospital on 07/26/20 due to Acute on chronic respiratory failure with Hypoxia and Hypercapnia. Discharge date was 08/01/20. Discharged from [place]?Medications Started at [facility] Discharge:?? -started empagliflozin, mometasone- formoterol, nicotine and torsemide.   Medication Changes at Hospital Discharge: -Changed mirtazipine, oxycodone, pantoprazole and pregabalin.    Medications Discontinued at Hospital Discharge: -Stopped None.    Medications that remain the same after Hospital Discharge:??  -All other medications will remain the same.        Medications: Outpatient Encounter Medications as of 12/05/2020  Medication Sig   albuterol (PROAIR HFA) 108 (90 Base) MCG/ACT inhaler Inhale 2 puffs into the lungs every 4 (four) hours as needed for wheezing or shortness of breath (cough).   ALPRAZolam (XANAX) 0.5 MG tablet TAKE 1 TABLET BY MOUTH TWICE DAILY AS NEEDED FOR ANXIETY.   aspirin EC 81 MG tablet Take 81 mg by mouth daily.   Budeson-Glycopyrrol-Formoterol (BREZTRI AEROSPHERE) 160-9-4.8 MCG/ACT AERO Inhale 2 puffs into the lungs in the morning and at bedtime.    DULoxetine (CYMBALTA) 60 MG capsule TAKE ONE CAPSULE BY MOUTH 2 TIMES A DAY.   empagliflozin (JARDIANCE) 10 MG TABS tablet Take 1 tablet (10 mg total) by mouth daily.   ezetimibe (ZETIA) 10 MG tablet Take 1 tablet (10 mg total) by mouth daily.   levothyroxine (SYNTHROID) 50 MCG tablet TAKE 1 TABLET IN THE MORNING BEFORE BREAKFAST.   midodrine (PROAMATINE) 5 MG tablet Take 1 tablet (5 mg total) by mouth 3 (three) times daily with meals.   mirtazapine (REMERON) 15 MG tablet TAKE (1) TABLET BY MOUTH AT BEDTIME.   nicotine (NICODERM CQ - DOSED IN MG/24 HR) 7 mg/24hr patch Place 1 patch (7 mg total) onto the skin daily.   oxyCODONE (OXY IR/ROXICODONE) 5 MG immediate release tablet Take 1 tablet (5 mg total) by mouth every 6 (six) hours as needed for severe pain.   pantoprazole (PROTONIX) 40 MG tablet TAKE ONE TABLET BY MOUTH TWICE DAILY BEFORE A MEAL.   pregabalin (LYRICA) 50 MG capsule TAKE 1 CAPSULE BY MOUTH IN THEMORNING AND 2 CAPSULES AT BEDTIME.   QUEtiapine (SEROQUEL) 50 MG tablet TAKE 1 OR 2 TABLETS BY MOUTH AT BEDTIME   torsemide (DEMADEX) 20 MG tablet Take 1 tablet (20 mg total) by mouth daily.   No facility-administered encounter medications on file as of 12/05/2020.  Reviewed chart prior to disease state call. Spoke with patient regarding BP  Recent Office Vitals: BP Readings from Last 3 Encounters:  11/23/20 98/60  11/17/20 (!) 161/84  09/25/20 128/64   Pulse Readings from Last 3 Encounters:  11/23/20 70  11/17/20 85  09/25/20 89    Wt Readings from Last  3 Encounters:  11/23/20 113 lb 6.4 oz (51.4 kg)  11/17/20 113 lb 12.8 oz (51.6 kg)  09/25/20 109 lb 6.4 oz (49.6 kg)     Kidney Function Lab Results  Component Value Date/Time   CREATININE 1.30 (H) 10/05/2020 01:37 PM   CREATININE 1.56 (H) 08/22/2020 04:05 PM   GFR 35.05 (L) 08/22/2020 04:05 PM   GFRNONAA 43 (L) 08/01/2020 07:26 AM   GFRAA 49 (L) 06/11/2019 03:30 PM    BMP Latest Ref Rng & Units 10/05/2020 08/22/2020  08/01/2020  Glucose 65 - 99 mg/dL 83 83 80  BUN 8 - 27 mg/dL 20 24(H) 47(H)  Creatinine 0.57 - 1.00 mg/dL 1.30(H) 1.56(H) 1.38(H)  BUN/Creat Ratio 12 - 28 15 - -  Sodium 134 - 144 mmol/L 140 138 141  Potassium 3.5 - 5.2 mmol/L 3.7 4.6 3.7  Chloride 96 - 106 mmol/L 92(L) 83(L) 82(L)  CO2 20 - 29 mmol/L 34(H) 47(H) 45(H)  Calcium 8.7 - 10.3 mg/dL 10.6(H) 11.1(H) 9.3    Current antihypertensive regimen:  Midodrine 5 mg 1 tablet three times daily How often are you checking your Blood Pressure? weekly Current home BP readings: Patient reports her pressures have been on the lower side lately. Numbers include an average of 81/52 she reports she has been feeling dizzy occassionally and lightheaded. What recent interventions/DTPs have been made by any provider to improve Blood Pressure control since last CPP Visit: Patient reports no change Any recent hospitalizations or ED visits since last visit with CPP? Yes What diet changes have been made to improve Blood Pressure Control?  Patient reports she has had some appetite loss is barely eating. What exercise is being done to improve your Blood Pressure Control?  Patient reports she is winded easily and using oxygen tanks.  Adherence Review: Is the patient currently on ACE/ARB medication? No Does the patient have >5 day gap between last estimated fill dates? No  Notes:  Per Watt Climes also called to Dr Morrison Old office an requested a new prescription for [facility] be sent via fax to [facility] and me @ [phone] for renewal of PAP approval. Advised patient of the above.  Patient reports she had a nurse scheduled to come out but could not recall the date and she did not call or show up to her home. She reports she does not wish to be charged for services she did not receive. Patient and husband also report they are not sure that the oxygen tanks she is currently using are working effectively or if they have a leak they can travel from home to [place] and will  run out prior to returning  using 2 small tanks and one big one.   Care Gaps: BP- 98/60 (office 11/22) PAP Smear - Overdue AWV - office aware to schedule CCM - 2/23   Star Rating Drugs: Empagliflozin (Jardiance ) 10 mg - Last filled 12/04/2020 [facility] [place] Pharmacist Assistant [phone]

## 2020-12-05 NOTE — Chronic Care Management (AMB) (Signed)
Chronic Care Management    Clinical Social Work Note  12/05/2020 Name: [name] MRN: [id number] DOB: 03-Apr-1955  [name] is a 65 y.o. year old female who is a primary care patient of [name], [name], MD. The CCM team was consulted to assist the patient with chronic disease management and/or care coordination needs related to: Mental Health Counseling and Resources.   Engaged with patient by telephone for initial visit in response to provider referral for social work chronic care management and care coordination services.   Consent to Services:  The patient was given information about Chronic Care Management services, agreed to services, and gave verbal consent prior to initiation of services.  Please see initial visit note for detailed documentation.   Patient agreed to services and consent obtained.   Assessment: Review of patient past medical history, allergies, medications, and health status, including review of relevant consultants reports was performed today as part of a comprehensive evaluation and provision of chronic care management and care coordination services.     SDOH (Social Determinants of Health) assessments and interventions performed:  SDOH Interventions    Flowsheet Row Most Recent Value  SDOH Interventions   Food Insecurity Interventions Intervention Not Indicated  Financial Strain Interventions Intervention Not Indicated  Housing Interventions Intervention Not Indicated  Intimate Partner Violence Interventions Intervention Not Indicated  Physical Activity Interventions Patient Refused  Stress Interventions Intervention Not Indicated, Offered [employer] Resources, Provide Counseling  Social Connections Interventions Intervention Not Indicated  Transportation Interventions Intervention Not Indicated        Advanced Directives Status: See Care Plan for related entries.  CCM Care Plan  Allergies  Allergen Reactions    Mirtazapine Other (See Comments)    halucinations   Acetaminophen-Codeine Nausea And Vomiting   Aspirin Nausea And Vomiting    (can take coated aspirin)   Astelin [Azelastine]    Bactrim [Sulfamethoxazole-Trimethoprim]    Celecoxib Other (See Comments)    "does the opposite of what it is suppose to do"   Ciprofloxacin    Fentanyl    Levaquin [Levofloxacin In D5w] Itching   Lipitor [Atorvastatin] Other (See Comments)    Myalgias    Neomycin    Salicylates    Sulfa Antibiotics Nausea And Vomiting   Adhesive [Tape] Rash   Latex Rash   Tegretol [Carbamazepine] Rash    Outpatient Encounter Medications as of 12/05/2020  Medication Sig   albuterol (PROAIR HFA) 108 (90 Base) MCG/ACT inhaler Inhale 2 puffs into the lungs every 4 (four) hours as needed for wheezing or shortness of breath (cough).   ALPRAZolam (XANAX) 0.5 MG tablet TAKE 1 TABLET BY MOUTH TWICE DAILY AS NEEDED FOR ANXIETY.   aspirin EC 81 MG tablet Take 81 mg by mouth daily.   Budeson-Glycopyrrol-Formoterol (BREZTRI AEROSPHERE) 160-9-4.8 MCG/ACT AERO Inhale 2 puffs into the lungs in the morning and at bedtime.   DULoxetine (CYMBALTA) 60 MG capsule TAKE ONE CAPSULE BY MOUTH 2 TIMES A DAY.   empagliflozin (JARDIANCE) 10 MG TABS tablet Take 1 tablet (10 mg total) by mouth daily.   ezetimibe (ZETIA) 10 MG tablet Take 1 tablet (10 mg total) by mouth daily.   levothyroxine (SYNTHROID) 50 MCG tablet TAKE 1 TABLET IN THE MORNING BEFORE BREAKFAST.   midodrine (PROAMATINE) 5 MG tablet Take 1 tablet (5 mg total) by mouth 3 (three) times daily with meals.   mirtazapine (REMERON) 15 MG tablet TAKE (1) TABLET BY MOUTH AT BEDTIME.   nicotine (NICODERM CQ -  DOSED IN MG/24 HR) 7 mg/24hr patch Place 1 patch (7 mg total) onto the skin daily.   oxyCODONE (OXY IR/ROXICODONE) 5 MG immediate release tablet Take 1 tablet (5 mg total) by mouth every 6 (six) hours as needed for severe pain.   pantoprazole (PROTONIX) 40 MG tablet TAKE ONE TABLET BY  MOUTH TWICE DAILY BEFORE A MEAL.   pregabalin (LYRICA) 50 MG capsule TAKE 1 CAPSULE BY MOUTH IN THEMORNING AND 2 CAPSULES AT BEDTIME.   QUEtiapine (SEROQUEL) 50 MG tablet TAKE 1 OR 2 TABLETS BY MOUTH AT BEDTIME   torsemide (DEMADEX) 20 MG tablet Take 1 tablet (20 mg total) by mouth daily.   No facility-administered encounter medications on file as of 12/05/2020.    Patient Active Problem List   Diagnosis Date Noted   Palliative care by specialist    Acute on chronic respiratory failure with hypoxia and hypercapnia ([place]) 07/26/2020   Acute on chronic combined systolic and diastolic CHF (congestive heart failure) ([place]) 07/26/2020   Idiopathic hypotension 10/08/2019   Encounter for long-term use of opiate analgesic 03/12/2019   IDA (iron deficiency anemia) 02/17/2019   Gastritis and gastroduodenitis 02/17/2019   COPD  GOLD II / still smoking  12/08/2018   Chronic respiratory failure with hypoxia and hypercapnia ([place]) 12/08/2018   Radiculopathy due to lumbar intervertebral disc disorder 10/29/2018   Sciatica of left side 10/29/2018   Anemia    Gastrointestinal hemorrhage    Elevated LFTs    Hypokalemia 08/24/2018   COPD exacerbation ([place]) 03/18/2014   Acute kidney injury ([place]) 03/18/2014   Acute respiratory failure with hypoxia ([place]) 03/18/2014   CAP (community acquired pneumonia) 03/17/2014   Pneumonia 03/17/2014   HTN (hypertension) 12/03/2012   Sepsis ([place]) 02/21/2012   PNA (pneumonia) 02/21/2012   Adrenal insufficiency ([place]) 02/21/2012   Subclavian steal syndrome 02/21/2012   Chronic pain 11/22/2010   Migraine headache 11/22/2010   Gastroesophageal reflux disease 11/22/2010   Goals of care, counseling/discussion 11/22/2010   Cerebrovascular disease 07/24/2010   Anxiety and depression    Hyperlipidemia    Hypothyroid    Cigarette smoker     Conditions to be addressed/monitored: Anxiety and Depression.  Limited Social Support, Mental Health Concerns, Social Isolation,  Limited Access to Caregiver, and Lacks Knowledge of [employer].  Care Plan : LCSW Plan of Care  Updates made by Francis Gaines, LCSW since 12/05/2020 12:00 AM     Problem: Resume and Stick with Counseling to Reduce and Manage Symptoms of Anxiety and Depression.   Priority: High     Goal: Resume and Stick with Counseling to Reduce and Manage Symptoms of Anxiety and Depression.   Start Date: 12/05/2020  Expected End Date: 03/07/2021  This Visit's Progress: On track  Priority: High  Note:   Current Barriers:   Acute Mental Health needs related to Hypertension, COPD Gold II with Exacerbation, Chronic Respiratory Failure with Hypoxia and Hypercapnia, Anxiety and Depression requires Support, Education, Resources, Referrals, Advocacy, and Care Coordination, in order to meet unmet mental health needs. Clinical Goal(s):  Patient will work with LCSW to reduce and manage symptoms of Anxiety and Depression, until established with a community provider.   Patient will increase knowledge and/or ability of:  Coping Skills, Healthy Habits, Self-Management Skills, Stress Reduction, Home Safety and Utilizing [employer] and Resources.   Clinical Interventions:  Assessed patient's previous treatment, needs, coping skills, current treatment, support system and barriers to care. PHQ-2 and PHQ-9 Depression Screening Tool performed and results reviewed  with patient. Mindfulness Meditation Strategies, Relaxation Techniques and Deep Breathing Exercises taught and encouraged daily. Active Listening/Reflection utilized, Engineer, petroleum provided, Brief Cognitive Behavioral Therapy initiated, Mental Health Medications reviewed and Compliance discussed, Quality of Sleep assessed and Sleep Hygiene Techniques promoted and Verbalization of Feelings encouraged.  Discussed plans with patient for ongoing care management follow-up and provided patient with direct contact information for care management  team. Discussed several options for long-term counseling based on need and insurance, and verbal consent obtained to place a referral to [facility] for ongoing mental health counseling and supportive services.  Collaboration with Primary Care Physician, Dr. Lelon Frohlich regarding development and update of comprehensive plan of care as evidenced by provider attestation and co-signature. Inter-disciplinary care team collaboration (see longitudinal plan of care). Patient Goals/Self-Care Activities: Begin personal counseling with LCSW, on a weekly/bi-weekly basis, to reduce and manage symptoms of Anxiety and Depression, until established with [facility]. Accept all calls from representative with [facility], in an effort to establish ongoing mental health counseling and supportive services. Incorporate into daily practice - relaxation techniques, deep breathing exercises and mindfulness meditation strategies. Continue with compliance of taking prescription medications, try to obtain adequate rest, stay well-hydrated and eat a healthy, well-balanced diet. Contact LCSW directly (# [id number]) if you have questions, need assistance, or if additional social work needs are identified between now and our next scheduled telephone outreach call. Follow-Up:  12/18/2020 at 10:30am   Nat Christen LCSW Licensed Clinical Social Worker [place] [phone]

## 2020-12-06 ENCOUNTER — Other Ambulatory Visit: Payer: Self-pay

## 2020-12-06 MED ORDER — BREZTRI AEROSPHERE 160-9-4.8 MCG/ACT IN AERO: 2.0000 | INHALATION_SPRAY | Freq: Two times a day (BID) | RESPIRATORY_TRACT | 11 refills | Status: DC

## 2020-12-06 NOTE — Telephone Encounter (Signed)
Prescription printed and placed in MW folder to sign.

## 2020-12-06 NOTE — Telephone Encounter (Signed)
Signed and faxed to [facility].

## 2020-12-06 NOTE — Telephone Encounter (Signed)
Prescription has been printed. Will place on Dr. Morrison Old folder for him to sign when he comes in today.

## 2020-12-07 ENCOUNTER — Other Ambulatory Visit: Payer: Medicare Other | Admitting: *Deleted

## 2020-12-07 ENCOUNTER — Other Ambulatory Visit: Payer: Self-pay

## 2020-12-07 ENCOUNTER — Other Ambulatory Visit: Payer: Medicare Other

## 2020-12-07 DIAGNOSIS — Z515 Encounter for palliative care: Secondary | ICD-10-CM

## 2020-12-07 NOTE — Progress Notes (Signed)
COMMUNITY PALLIATIVE CARE SW NOTE  PATIENT NAME: [name] DOB: 11-19-1955 MRN: [id number]  PRIMARY CARE PROVIDER: Isaac Smith, Beverly Halsted, MD  RESPONSIBLE PARTY:  Acct ID - Guarantor Home Phone Work Phone Relationship Acct Type  [id number] [name], [name]  Self P/F     [address] [place], [place], [place] [address]   Due to the COVID-19 crisis, this virtual check-in visit was done via telephone from my office and it was initiated and consent by this patient and or family   PLAN OF CARE and INTERVENTIONS:             GOALS OF CARE/ ADVANCE CARE PLANNING:  Goal is for patient to remain in her home with her husband. Patient is a Full Code.  SOCIAL/EMOTIONAL/SPIRITUAL ASSESSMENT/ INTERVENTIONS:  SW and RN-[name] completed a joint telephonic check-in with patient. Patient was very hoarse. She advised that she was not feeling well. She reports running a fever and having cough and congestion. Patient reported that she has not been feeling well for several days. She continues to wear o2 continuously between 3/4L. Her appetite remains poor overall, and she is thin and frail in appearance. Patient advised that if her symptoms did not improve, she will go to the doctor. Patient remains open to ongoing palliative care visits/support. PATIENT/CAREGIVER EDUCATION/ COPING:  Patient is coping adequately. PERSONAL EMERGENCY PLAN:  911 can be activated for emergencies. COMMUNITY RESOURCES COORDINATION/ HEALTH CARE NAVIGATION:  None. FINANCIAL/LEGAL CONCERNS/INTERVENTIONS:  None.     SOCIAL HX:  Social History   Tobacco Use   Smoking status: Every Day    Packs/day: 0.25    Years: 40.00    Pack years: 10.00    Types: Cigarettes    Start date: 09/06/1972    Passive exposure: Current   Smokeless tobacco: Never   Tobacco comments:    Patient has 1-2 cigs a day and trying to quit.   Substance Use Topics   Alcohol use: No    Alcohol/week: 0.0 standard drinks    CODE STATUS: Full  Code ADVANCED DIRECTIVES: Yes MOST FORM COMPLETE:  No HOSPICE EDUCATION PROVIDED: No  PPS: Patient is alert and oriented x3. Patient ambulates independently. She is on continuous o2.   Duration of visit and documentation: 60 minutes        Beverly Puller, LCSW

## 2020-12-08 ENCOUNTER — Telehealth: Payer: Medicare Other

## 2020-12-15 ENCOUNTER — Encounter: Payer: Self-pay | Admitting: Cardiology

## 2020-12-15 ENCOUNTER — Ambulatory Visit: Payer: Medicare Other | Admitting: Cardiology

## 2020-12-15 NOTE — Progress Notes (Deleted)
  Cardiology Office Note  Date: 12/15/2020   ID: [name], DOB 09/10/1955, MRN [id number]  PCP:  Hernandez Acosta, Estela Y, MD  Cardiologist:   , MD Electrophysiologist:  None   No chief complaint on file.   History of Present Illness: [name] is a 65 y.o. female last seen in August by Mr. Quinn NP.  I reviewed interval chart and testing since I met her in January.  Recent echocardiogram in October revealed LVEF 30 to 35% with global hypokinesis and septal motion consistent with left bundle branch block, normal RV contraction.  Cardiomyopathy was diagnosed in June during hospital stay with hypoxic respiratory failure in the setting of COPD exacerbation and also acute combined heart failure.  She had normal coronary arteries documented in 2015.  I see that she had a palliative care home visit just recently, she is full code.  Past Medical History:  Diagnosis Date   Anemia    Anxiety and depression    Bronchitis    Chronic pain    COPD (chronic obstructive pulmonary disease) (HCC)    Gastroesophageal reflux disease    History of cardiac catheterization    Normal coronary arteries 2015   Hyperlipidemia    Hypertension    Hypothyroid    Migraine headache    Near syncope    On home oxygen therapy    Started 08/2018   Peripheral vascular disease (HCC)    Pneumonia    Tobacco abuse    1/2 pack per day   Uterine cancer (HCC)     Past Surgical History:  Procedure Laterality Date   ABDOMINAL HYSTERECTOMY     without oophorectomy for neoplastic disease   BACK SURGERY     BIOPSY  10/22/2018   Procedure: BIOPSY;  Surgeon: Fields, Sandi L, MD;  Location: [facility];  Service: Endoscopy;;  duodenal, gastric   CARDIAC CATHETERIZATION  07/2013   normal coronary arteries   CARPAL TUNNEL RELEASE     Left   CHOLECYSTECTOMY  2007   DILATION AND CURETTAGE OF UTERUS     ESOPHAGOGASTRODUODENOSCOPY (EGD) WITH PROPOFOL N/A 10/22/2018   gastritis  (mild reactive gastropathy, negative duodenal biopsies for celiac).    LAPAROTOMY     LEFT HEART CATHETERIZATION WITH CORONARY ANGIOGRAM N/A 07/12/2013   Procedure: LEFT HEART CATHETERIZATION WITH CORONARY ANGIOGRAM;  Surgeon: David W Harding, MD;  Location: [facility];  Service: Cardiovascular;  Laterality: N/A;   LUMBAR LAMINECTOMY  x3   Left; complicated by neurologic dysfunction   NASAL SINUS SURGERY      Current Outpatient Medications  Medication Sig Dispense Refill   albuterol (PROAIR HFA) 108 (90 Base) MCG/ACT inhaler Inhale 2 puffs into the lungs every 4 (four) hours as needed for wheezing or shortness of breath (cough). 18 g 11   ALPRAZolam (XANAX) 0.5 MG tablet TAKE 1 TABLET BY MOUTH TWICE DAILY AS NEEDED FOR ANXIETY. 60 tablet 5   aspirin EC 81 MG tablet Take 81 mg by mouth daily.     Budeson-Glycopyrrol-Formoterol (BREZTRI AEROSPHERE) 160-9-4.8 MCG/ACT AERO Inhale 2 puffs into the lungs in the morning and at bedtime. 5.9 g 0   Budeson-Glycopyrrol-Formoterol (BREZTRI AEROSPHERE) 160-9-4.8 MCG/ACT AERO Inhale 2 puffs into the lungs in the morning and at bedtime. 10.7 g 11   DULoxetine (CYMBALTA) 60 MG capsule TAKE ONE CAPSULE BY MOUTH 2 TIMES A DAY. 60 capsule 5   empagliflozin (JARDIANCE) 10 MG TABS tablet Take 1 tablet (10 mg total) by mouth   daily. 90 tablet 3   ezetimibe (ZETIA) 10 MG tablet Take 1 tablet (10 mg total) by mouth daily. 30 tablet 6   levothyroxine (SYNTHROID) 50 MCG tablet TAKE 1 TABLET IN THE MORNING BEFORE BREAKFAST. 30 tablet 11   midodrine (PROAMATINE) 5 MG tablet Take 1 tablet (5 mg total) by mouth 3 (three) times daily with meals. 90 tablet 5   mirtazapine (REMERON) 15 MG tablet TAKE (1) TABLET BY MOUTH AT BEDTIME. 30 tablet 5   nicotine (NICODERM CQ - DOSED IN MG/24 HR) 7 mg/24hr patch Place 1 patch (7 mg total) onto the skin daily. 28 patch 2   oxyCODONE (OXY IR/ROXICODONE) 5 MG immediate release tablet Take 1 tablet (5 mg total) by mouth every 6 (six) hours  as needed for severe pain. 120 tablet 0   pantoprazole (PROTONIX) 40 MG tablet TAKE ONE TABLET BY MOUTH TWICE DAILY BEFORE A MEAL. 180 tablet 0   pregabalin (LYRICA) 50 MG capsule TAKE 1 CAPSULE BY MOUTH IN THEMORNING AND 2 CAPSULES AT BEDTIME. 90 capsule 5   QUEtiapine (SEROQUEL) 50 MG tablet TAKE 1 OR 2 TABLETS BY MOUTH AT BEDTIME 60 tablet 5   torsemide (DEMADEX) 20 MG tablet Take 1 tablet (20 mg total) by mouth daily. 90 tablet 1   No current facility-administered medications for this visit.   Allergies:  Mirtazapine, Acetaminophen-codeine, Aspirin, Astelin [azelastine], Bactrim [sulfamethoxazole-trimethoprim], Celecoxib, Ciprofloxacin, Fentanyl, Levaquin [levofloxacin in d5w], Lipitor [atorvastatin], Neomycin, Salicylates, Sulfa antibiotics, Adhesive [tape], Latex, and Tegretol [carbamazepine]   Social History: The patient  reports that she has been smoking cigarettes. She started smoking about 48 years ago. She has a 10.00 pack-year smoking history. She has been exposed to tobacco smoke. She has never used smokeless tobacco. She reports that she does not drink alcohol and does not use drugs.   Family History: The patient's family history includes Bleeding Disorder in her mother; Colon polyps in her mother; Deep vein thrombosis in her mother; Heart disease in her brother and mother; Hypertension in her brother, mother, and sister.   ROS:  Please see the history of present illness. Otherwise, complete review of systems is positive for {NONE DEFAULTED:18576}.  All other systems are reviewed and negative.   Physical Exam: VS:  There were no vitals taken for this visit., BMI There is no height or weight on file to calculate BMI.  Wt Readings from Last 3 Encounters:  11/23/20 113 lb 6.4 oz (51.4 kg)  11/17/20 113 lb 12.8 oz (51.6 kg)  09/25/20 109 lb 6.4 oz (49.6 kg)    General: Patient appears comfortable at rest. HEENT: Conjunctiva and lids normal, oropharynx clear with moist  mucosa. Neck: Supple, no elevated JVP or carotid bruits, no thyromegaly. Lungs: Clear to auscultation, nonlabored breathing at rest. Cardiac: Regular rate and rhythm, no S3 or significant systolic murmur, no pericardial rub. Abdomen: Soft, nontender, no hepatomegaly, bowel sounds present, no guarding or rebound. Extremities: No pitting edema, distal pulses 2+. Skin: Warm and dry. Musculoskeletal: No kyphosis. Neuropsychiatric: Alert and oriented x3, affect grossly appropriate.  ECG:  An ECG dated 07/27/2020 was personally reviewed today and demonstrated:  Sinus rhythm with left bundle branch block.  Recent Labwork: 07/26/2020: B Natriuretic Peptide 3,606.8 08/22/2020: ALT 9; AST 19; Hemoglobin 13.0; Platelets 742.0 Repeated and verified X2. 10/05/2020: BUN 20; Creatinine, Ser 1.30; Magnesium 2.1; Potassium 3.7; Sodium 140 11/17/2020: TSH 1.240     Component Value Date/Time   CHOL 129 07/28/2020 0125   TRIG 108 07/28/2020 0125     HDL 28 (L) 07/28/2020 0125   CHOLHDL 4.6 07/28/2020 0125   VLDL 22 07/28/2020 0125   LDLCALC 79 07/28/2020 0125    Other Studies Reviewed Today:  Carotid Dopplers 03/10/2020: IMPRESSION: No significant stenosis of internal carotid arteries.  Echocardiogram 11/30/2020:  1. Left ventricular ejection fraction, by estimation, is 30 to 35%. The  left ventricle has moderately decreased function. The left ventricle  demonstrates global hypokinesis with paradoxical septal motion in the  setting of LBBB. There is mild left  ventricular hypertrophy. Left ventricular diastolic parameters are  indeterminate. Global longitudinal strain measures not reported due to  incomplete tracking of myocardial segments.   2. Right ventricular systolic function is normal. The right ventricular  size is normal. There is normal pulmonary artery systolic pressure. The  estimated right ventricular systolic pressure is 28.2 mmHg.   3. The mitral valve is grossly normal. Trivial mitral  valve  regurgitation.   4. The aortic valve is tricuspid. Aortic valve regurgitation is not  visualized.   5. The inferior vena cava is normal in size with greater than 50%  respiratory variability, suggesting right atrial pressure of 3 mmHg.   Assessment and Plan:    Medication Adjustments/Labs and Tests Ordered: Current medicines are reviewed at length with the patient today.  Concerns regarding medicines are outlined above.   Tests Ordered: No orders of the defined types were placed in this encounter.   Medication Changes: No orders of the defined types were placed in this encounter.   Disposition:  Follow up {follow up:15908}  Signed,  G. , MD, FACC 12/15/2020 10:20 AM    [facility] [place] [address], [place], [place] [address] Phone: [phone]; Fax: [phone]  

## 2020-12-18 ENCOUNTER — Ambulatory Visit: Payer: Medicare Other | Admitting: *Deleted

## 2020-12-18 ENCOUNTER — Telehealth: Payer: Self-pay | Admitting: Internal Medicine

## 2020-12-18 DIAGNOSIS — J9612 Chronic respiratory failure with hypercapnia: Secondary | ICD-10-CM

## 2020-12-18 DIAGNOSIS — J441 Chronic obstructive pulmonary disease with (acute) exacerbation: Secondary | ICD-10-CM

## 2020-12-18 DIAGNOSIS — J9611 Chronic respiratory failure with hypoxia: Secondary | ICD-10-CM

## 2020-12-18 DIAGNOSIS — G894 Chronic pain syndrome: Secondary | ICD-10-CM

## 2020-12-18 DIAGNOSIS — I679 Cerebrovascular disease, unspecified: Secondary | ICD-10-CM

## 2020-12-18 DIAGNOSIS — I10 Essential (primary) hypertension: Secondary | ICD-10-CM

## 2020-12-18 DIAGNOSIS — J9601 Acute respiratory failure with hypoxia: Secondary | ICD-10-CM

## 2020-12-18 DIAGNOSIS — J449 Chronic obstructive pulmonary disease, unspecified: Secondary | ICD-10-CM

## 2020-12-18 DIAGNOSIS — F419 Anxiety disorder, unspecified: Secondary | ICD-10-CM

## 2020-12-18 DIAGNOSIS — F32A Depression, unspecified: Secondary | ICD-10-CM

## 2020-12-18 DIAGNOSIS — J9621 Acute and chronic respiratory failure with hypoxia: Secondary | ICD-10-CM

## 2020-12-18 DIAGNOSIS — J9622 Acute and chronic respiratory failure with hypercapnia: Secondary | ICD-10-CM

## 2020-12-18 NOTE — Telephone Encounter (Signed)
Called and spoke to Education officer, museum. She states patient is trying to get an inogen portable oxygen. States per pt it was discussed with Dr. Melvyn Novas at last [place] and is asking if she needs a sooner appt to be able to get the inogen sooner. Per Education officer, museum patient states that there were papers faxed over to [place] regarding portable oxygen but no order. After looking in patients chart I was unable to find any notes or orders regarding an inogen. Patient is scheduled for a [place] office visit on 12/14. No sooner appts are available in [facility] office.   Dr. Melvyn Novas please advise.

## 2020-12-18 NOTE — Patient Instructions (Signed)
Visit Information  Current Barriers:   Acute Mental Health needs related to Hypertension, COPD Gold II with Exacerbation, Chronic Respiratory Failure with Hypoxia and Hypercapnia, Anxiety and Depression requires Support, Education, Resources, Referrals, Advocacy, and Care Coordination, in order to meet unmet mental health needs. Clinical Goal(s):  Patient will work with LCSW to reduce and manage symptoms of Anxiety and Depression, until established with a community provider.   Patient will increase knowledge and/or ability of:  Coping Skills, Healthy Habits, Self-Management Skills, Stress Reduction, Home Safety and Utilizing [employer] and Resources.   Clinical Interventions:  Active Listening/Reflection utilized, Engineer, petroleum provided, Cognitive Behavioral Therapy initiated and Verbalization of Feelings encouraged.  Collaboration with Primary Care Physician, Dr. Lelon Frohlich regarding development and update of comprehensive plan of care as evidenced by provider attestation and co-signature. Inter-disciplinary care team collaboration (see longitudinal plan of care). Patient Goals/Self-Care Activities: Begin personal counseling with LCSW, on a weekly/bi-weekly basis, to reduce and manage symptoms of Anxiety and Depression, until established with [place] for ongoing mental health counseling and supportive services.  Received e-mail message from [facility], Representative with [facility], in an effort to establish services; however, patient never  received the message, as she no longer has Internet access.   Received voicemail message from Vernona Rieger, Representative with [id number] in an effort to establish services; however, Ms.  Grandville Silos works at [employer] in [place], [place], unable to provide in-person counseling sessions, only virtual.   LCSW collaboration with representative from [employer], requesting that you be matched  with a local provider, explaining that you can only be reached by telephone, providing direct contact information.  Please accept all calls from representative with [facility], in an effort to establish ongoing mental health counseling and supportive services. LCSW collaboration with Ewell Poe, Nurse for Dr. Christinia Gully, Pulmonologist with [facility] [place] [phone]), to request order for portable oxygen carrier (Inogen Portable Oxygen Concentrator) be faxed to Maudie Mercury, Nurse with [facility] [phone]). LCSW collaboration with Maudie Mercury, Nurse with [employer], to confirm that order, and 21 pages of supporting documentation, have been successfully received via fax.  Current portable oxygen tank only lasts for 1 hour.  Patient requires 4 liters of continuous oxygen via nasal cannula.  Unable to afford Inogen Portable Oxygen Concentrator without insurance approval.  Out-of-pocket expense is $3,000.00. LCSW collaboration with Fritzi Mandes, Certified Medical Associate for Dr. Christinia Gully, Pulmonologist with [facility] at [facility], to inquire about need for follow-up appointment.    Contact LCSW directly (# [id number]) if you have questions, need assistance, or if additional social work needs are identified between now and our next scheduled telephone outreach call. Follow-Up:  12/26/2020 at 12:45pm  Patient verbalizes understanding of instructions provided today and agrees to view in [place].   Telephone follow up appointment with care management team member scheduled for:  12/26/2020 at 12:45pm  [place] Licensed Clinical Social Worker [place] [phone]

## 2020-12-18 NOTE — Chronic Care Management (AMB) (Signed)
Chronic Care Management    Clinical Social Work Note  12/18/2020 Name: [name] MRN: [id number] DOB: [birth year]  [name] is a 65 y.o. year old female who is a primary care patient of [name], [name], MD. The CCM team was consulted to assist the patient with chronic disease management and/or care coordination needs related to: Appointment Scheduling Needs, [employer], and Mental Health Counseling and Resources.   Engaged with patient by telephone for follow up visit in response to provider referral for social work chronic care management and care coordination services.   Consent to Services:  The patient was given information about Chronic Care Management services, agreed to services, and gave verbal consent prior to initiation of services.  Please see initial visit note for detailed documentation.   Patient agreed to services and consent obtained.   Assessment: Review of patient past medical history, allergies, medications, and health status, including review of relevant consultants reports was performed today as part of a comprehensive evaluation and provision of chronic care management and care coordination services.     SDOH (Social Determinants of Health) assessments and interventions performed:    Advanced Directives Status: Not addressed in this encounter.  CCM Care Plan  Allergies  Allergen Reactions   Mirtazapine Other (See Comments)    halucinations   Acetaminophen-Codeine Nausea And Vomiting   Aspirin Nausea And Vomiting    (can take coated aspirin)   Astelin [Azelastine]    Bactrim [Sulfamethoxazole-Trimethoprim]    Celecoxib Other (See Comments)    "does the opposite of what it is suppose to do"   Ciprofloxacin    Fentanyl    Levaquin [Levofloxacin In D5w] Itching   Lipitor [Atorvastatin] Other (See Comments)    Myalgias    Neomycin    Salicylates    Sulfa Antibiotics Nausea And Vomiting   Adhesive [Tape] Rash   Latex  Rash   Tegretol [Carbamazepine] Rash    Outpatient Encounter Medications as of 12/18/2020  Medication Sig   albuterol (PROAIR HFA) 108 (90 Base) MCG/ACT inhaler Inhale 2 puffs into the lungs every 4 (four) hours as needed for wheezing or shortness of breath (cough).   ALPRAZolam (XANAX) 0.5 MG tablet TAKE 1 TABLET BY MOUTH TWICE DAILY AS NEEDED FOR ANXIETY.   aspirin EC 81 MG tablet Take 81 mg by mouth daily.   Budeson-Glycopyrrol-Formoterol (BREZTRI AEROSPHERE) 160-9-4.8 MCG/ACT AERO Inhale 2 puffs into the lungs in the morning and at bedtime.   Budeson-Glycopyrrol-Formoterol (BREZTRI AEROSPHERE) 160-9-4.8 MCG/ACT AERO Inhale 2 puffs into the lungs in the morning and at bedtime.   DULoxetine (CYMBALTA) 60 MG capsule TAKE ONE CAPSULE BY MOUTH 2 TIMES A DAY.   empagliflozin (JARDIANCE) 10 MG TABS tablet Take 1 tablet (10 mg total) by mouth daily.   ezetimibe (ZETIA) 10 MG tablet Take 1 tablet (10 mg total) by mouth daily.   levothyroxine (SYNTHROID) 50 MCG tablet TAKE 1 TABLET IN THE MORNING BEFORE BREAKFAST.   midodrine (PROAMATINE) 5 MG tablet Take 1 tablet (5 mg total) by mouth 3 (three) times daily with meals.   mirtazapine (REMERON) 15 MG tablet TAKE (1) TABLET BY MOUTH AT BEDTIME.   nicotine (NICODERM CQ - DOSED IN MG/24 HR) 7 mg/24hr patch Place 1 patch (7 mg total) onto the skin daily.   oxyCODONE (OXY IR/ROXICODONE) 5 MG immediate release tablet Take 1 tablet (5 mg total) by mouth every 6 (six) hours as needed for severe pain.   pantoprazole (PROTONIX) 40 MG tablet  TAKE ONE TABLET BY MOUTH TWICE DAILY BEFORE A MEAL.   pregabalin (LYRICA) 50 MG capsule TAKE 1 CAPSULE BY MOUTH IN THEMORNING AND 2 CAPSULES AT BEDTIME.   QUEtiapine (SEROQUEL) 50 MG tablet TAKE 1 OR 2 TABLETS BY MOUTH AT BEDTIME   torsemide (DEMADEX) 20 MG tablet Take 1 tablet (20 mg total) by mouth daily.   No facility-administered encounter medications on file as of 12/18/2020.    Patient Active Problem List    Diagnosis Date Noted   Palliative care by specialist    Acute on chronic respiratory failure with hypoxia and hypercapnia ([place]) 07/26/2020   Acute on chronic combined systolic and diastolic CHF (congestive heart failure) ([place]) 07/26/2020   Idiopathic hypotension 10/08/2019   Encounter for long-term use of opiate analgesic 03/12/2019   IDA (iron deficiency anemia) 02/17/2019   Gastritis and gastroduodenitis 02/17/2019   COPD  GOLD II / still smoking  12/08/2018   Chronic respiratory failure with hypoxia and hypercapnia ([place]) 12/08/2018   Radiculopathy due to lumbar intervertebral disc disorder 10/29/2018   Sciatica of left side 10/29/2018   Anemia    Gastrointestinal hemorrhage    Elevated LFTs    Hypokalemia 08/24/2018   COPD exacerbation ([place]) 03/18/2014   Acute kidney injury ([place]) 03/18/2014   Acute respiratory failure with hypoxia ([place]) 03/18/2014   CAP (community acquired pneumonia) 03/17/2014   Pneumonia 03/17/2014   HTN (hypertension) 12/03/2012   Sepsis ([place]) 02/21/2012   PNA (pneumonia) 02/21/2012   Adrenal insufficiency ([place]) 02/21/2012   Subclavian steal syndrome 02/21/2012   Chronic pain 11/22/2010   Migraine headache 11/22/2010   Gastroesophageal reflux disease 11/22/2010   Goals of care, counseling/discussion 11/22/2010   Cerebrovascular disease 07/24/2010   Anxiety and depression    Hyperlipidemia    Hypothyroid    Cigarette smoker     Conditions to be addressed/monitored: HTN, COPD, Anxiety, and Depression.  Film/video editor, Mental Health Concerns, Limited Access to Caregiver, and Lacks Knowledge of [employer].  Care Plan : LCSW Plan of Care  Updates made by Francis Gaines, LCSW since 12/18/2020 12:00 AM     Problem: Resume and Stick with Counseling to Reduce and Manage Symptoms of Anxiety and Depression.   Priority: High     Goal: Resume and Stick with Counseling to Reduce and Manage Symptoms of Anxiety and Depression.   Start Date:  12/05/2020  Expected End Date: 03/07/2021  This Visit's Progress: On track  Recent Progress: On track  Priority: High  Note:   Current Barriers:   Acute Mental Health needs related to Hypertension, COPD Gold II with Exacerbation, Chronic Respiratory Failure with Hypoxia and Hypercapnia, Anxiety and Depression requires Support, Education, Resources, Referrals, Advocacy, and Care Coordination, in order to meet unmet mental health needs. Clinical Goal(s):  Patient will work with LCSW to reduce and manage symptoms of Anxiety and Depression, until established with a community provider.   Patient will increase knowledge and/or ability of:  Coping Skills, Healthy Habits, Self-Management Skills, Stress Reduction, Home Safety and Utilizing [employer] and Resources.   Clinical Interventions:  Active Listening/Reflection utilized, Engineer, petroleum provided, Cognitive Behavioral Therapy initiated and Verbalization of Feelings encouraged.  Collaboration with Primary Care Physician, Dr. Lelon Frohlich regarding development and update of comprehensive plan of care as evidenced by provider attestation and co-signature. Inter-disciplinary care team collaboration (see longitudinal plan of care). Patient Goals/Self-Care Activities: Begin personal counseling with LCSW, on a weekly/bi-weekly basis, to reduce and manage symptoms of Anxiety and Depression, until  established with [place] for ongoing mental health counseling and supportive services.  Received e-mail message from [facility], Representative with [facility], in an effort to establish services; however, patient never  received the message, as she no longer has Internet access.   Received voicemail message from Vernona Rieger, Representative with [facility], in an effort to establish services; however, Ms.  Grandville Silos works at [employer] in [place], [place], unable to provide in-person counseling sessions,  only virtual.   LCSW collaboration with representative from [employer], requesting that you be matched with a local provider, explaining that you can only be reached by telephone, providing direct contact information.  Please accept all calls from representative with [facility], in an effort to establish ongoing mental health counseling and supportive services. LCSW collaboration with Ewell Poe, Nurse for Dr. Christinia Gully, Pulmonologist with [facility] at [place] [phone]), to request order for portable oxygen carrier (Inogen Portable Oxygen Concentrator) be faxed to Maudie Mercury, Nurse with [facility] [phone]). LCSW collaboration with Maudie Mercury, Nurse with [employer], to confirm that order, and 21 pages of supporting documentation, have been successfully received via fax.  Current portable oxygen tank only lasts for 1 hour.  Patient requires 4 liters of continuous oxygen via nasal cannula.  Unable to afford Inogen Portable Oxygen Concentrator without insurance approval.  Out-of-pocket expense is $3,000.00. LCSW collaboration with Fritzi Mandes, Certified Medical Associate for Dr. Christinia Gully, Pulmonologist with [facility] at [facility], to inquire about need for follow-up appointment.    Contact LCSW directly (# [id number]) if you have questions, need assistance, or if additional social work needs are identified between now and our next scheduled telephone outreach call. Follow-Up:  12/26/2020 at 12:45pm   [place] Licensed Clinical Social Worker [place] [phone]

## 2020-12-18 NOTE — Telephone Encounter (Signed)
Probably came from the hospital  as the lat time we walked her in office she  needed 4lpm Pulsed by that study was done in 12/06/2019 so I can't sign an order for it but should already have access to alternate supply for portable even if hasn't yet recertified for th POC that would tide her over until returns

## 2020-12-19 NOTE — Telephone Encounter (Signed)
LMTCB with patient.   LMTCB with Education officer, museum. Per MW patient should have oxygen to hold her over until the OV. No sooner appt available.

## 2020-12-20 ENCOUNTER — Ambulatory Visit: Payer: Medicare Other | Admitting: Internal Medicine

## 2020-12-20 ENCOUNTER — Encounter: Payer: Self-pay | Admitting: *Deleted

## 2020-12-20 ENCOUNTER — Telehealth: Payer: Self-pay | Admitting: *Deleted

## 2020-12-20 DIAGNOSIS — R931 Abnormal findings on diagnostic imaging of heart and coronary circulation: Secondary | ICD-10-CM

## 2020-12-20 DIAGNOSIS — I5042 Chronic combined systolic (congestive) and diastolic (congestive) heart failure: Secondary | ICD-10-CM

## 2020-12-20 NOTE — Telephone Encounter (Signed)
Laurine Blazer, LPN  72/10/4707  6:28 PM EST Back to Top    Notified, copy to pcp.  She agrees to doing Lexiscan stress test.  Will enter order & send to pcc for scheduling.    Berlinda Last, [place]  12/01/2020  9:43 AM EDT     Verta Ellen., NP  Berlinda Last, CMA [facility] Pina. Tell her when she calls back we probably need to do a Lexiscan stress test and a repeat BMET and magnesium. We may need to start entresto or similar medication depending on labs and stress test results. There has to be a reason her EF is all of a sudden decreased and she has wall motion abnormalities. She has LBBB which would cause the walls to move abnormally but this may not be the only reason.    Berlinda Last, [facility]  12/01/2020  8:17 AM EDT     Left a message for patient to call office back for results.    Verta Ellen., NP  11/30/2020 10:53 PM EDT     Please call the patient and let her know the repeat echocardiogram shows no significant change from prior echocardiogram in June.  Her pumping function is still in the low range of 30 to 35%.  The main pumping chamber is mildly more muscular than normal.  She has a very mild leak in her mitral valve.  Verta Ellen, NP  11/30/2020 10:51 PM

## 2020-12-21 ENCOUNTER — Telehealth: Payer: Self-pay | Admitting: Family Medicine

## 2020-12-21 NOTE — Telephone Encounter (Signed)
Checking percert on the following patient for testing scheduled at [facility].     Lexiscan  01-02-2021

## 2020-12-26 ENCOUNTER — Other Ambulatory Visit: Payer: Self-pay | Admitting: Physical Medicine and Rehabilitation

## 2020-12-26 ENCOUNTER — Ambulatory Visit: Payer: Medicare Other | Admitting: *Deleted

## 2020-12-26 DIAGNOSIS — J441 Chronic obstructive pulmonary disease with (acute) exacerbation: Secondary | ICD-10-CM

## 2020-12-26 DIAGNOSIS — J9611 Chronic respiratory failure with hypoxia: Secondary | ICD-10-CM

## 2020-12-26 DIAGNOSIS — I10 Essential (primary) hypertension: Secondary | ICD-10-CM

## 2020-12-26 DIAGNOSIS — F32A Depression, unspecified: Secondary | ICD-10-CM

## 2020-12-26 DIAGNOSIS — J9601 Acute respiratory failure with hypoxia: Secondary | ICD-10-CM

## 2020-12-26 DIAGNOSIS — N179 Acute kidney failure, unspecified: Secondary | ICD-10-CM

## 2020-12-26 DIAGNOSIS — J449 Chronic obstructive pulmonary disease, unspecified: Secondary | ICD-10-CM

## 2020-12-26 DIAGNOSIS — E782 Mixed hyperlipidemia: Secondary | ICD-10-CM

## 2020-12-26 DIAGNOSIS — I679 Cerebrovascular disease, unspecified: Secondary | ICD-10-CM

## 2020-12-26 DIAGNOSIS — G894 Chronic pain syndrome: Secondary | ICD-10-CM

## 2020-12-26 NOTE — Patient Instructions (Signed)
Visit Information  Thank you for taking time to visit with me today. Please don't hesitate to contact me if I can be of assistance to you before our next scheduled telephone appointment.  Following are the goals we discussed today:   Patient Goals/Self-Care Activities: Receive personal counseling with LCSW, on a bi-weekly basis, to reduce and manage symptoms of Anxiety and Depression, until well-established with [place] for ongoing mental health counseling and supportive services. Please return all calls, contact number provided, as well as accept all calls, from [facility], in an effort to establish services, as representative reported that they have been trying to make contact with you by phone.   LCSW collaboration with Dr. Christinia Gully, Pulmonologist with [facility] at [place] [phone]), to request order for portable oxygen carrier (Inogen Portable Oxygen Concentrator) be faxed to Maudie Mercury, Nurse with [facility] [phone]). Reviewed all upcoming scheduled provider appointments, to ensure accuracy of date, time, location and provider, to avoid future no-shows and/or last minute cancellations.   Contact LCSW directly (# [id number]) if you have questions, need assistance, or if additional social work needs are identified between now and our next scheduled telephone outreach call.  Our next appointment is by telephone on 01/09/2021 at 11:30 am  Please call the care guide team at [phone] if you need to cancel or reschedule your appointment.   Please call the Suicide and Crisis Lifeline: 988 call the [place] National Suicide Prevention Lifeline: (972)311-8345 or TTY: (908)092-9309 TTY 301 830 1812) to talk to a trained counselor call 1-800-273-TALK (toll free, 24 hour hotline) call 911 if you are experiencing a Mental Health or [place] or need someone to talk to.  Patient verbalizes understanding of instructions provided today and  agrees to view in [place].   Nat Christen LCSW Licensed Clinical Social Worker [place] [phone]

## 2020-12-26 NOTE — Chronic Care Management (AMB) (Signed)
Chronic Care Management    Clinical Social Work Note  12/26/2020 Name: [name] MRN: [id number] DOB: 1955-07-20  [name] is a 65 y.o. year old female who is a primary care patient of [name], [name], MD. The CCM team was consulted to assist the patient with chronic disease management and/or care coordination needs related to: Appointment Scheduling Needs, Community Resources, Mental Health Counseling and Resources, and Grief Counseling.   Engaged with patient by telephone for follow up visit in response to provider referral for social work chronic care management and care coordination services.   Consent to Services:  The patient was given information about Chronic Care Management services, agreed to services, and gave verbal consent prior to initiation of services.  Please see initial visit note for detailed documentation.   Patient agreed to services and consent obtained.   Assessment: Review of patient past medical history, allergies, medications, and health status, including review of relevant consultants reports was performed today as part of a comprehensive evaluation and provision of chronic care management and care coordination services.     SDOH (Social Determinants of Health) assessments and interventions performed:    Advanced Directives Status: Not addressed in this encounter.  CCM Care Plan  Allergies  Allergen Reactions   Mirtazapine Other (See Comments)    halucinations   Acetaminophen-Codeine Nausea And Vomiting   Aspirin Nausea And Vomiting    (can take coated aspirin)   Astelin [Azelastine]    Bactrim [Sulfamethoxazole-Trimethoprim]    Celecoxib Other (See Comments)    "does the opposite of what it is suppose to do"   Ciprofloxacin    Fentanyl    Levaquin [Levofloxacin In D5w] Itching   Lipitor [Atorvastatin] Other (See Comments)    Myalgias    Neomycin    Salicylates    Sulfa Antibiotics Nausea And Vomiting   Adhesive [Tape]  Rash   Latex Rash   Tegretol [Carbamazepine] Rash    Outpatient Encounter Medications as of 12/26/2020  Medication Sig   albuterol (PROAIR HFA) 108 (90 Base) MCG/ACT inhaler Inhale 2 puffs into the lungs every 4 (four) hours as needed for wheezing or shortness of breath (cough).   ALPRAZolam (XANAX) 0.5 MG tablet TAKE 1 TABLET BY MOUTH TWICE DAILY AS NEEDED FOR ANXIETY.   aspirin EC 81 MG tablet Take 81 mg by mouth daily.   Budeson-Glycopyrrol-Formoterol (BREZTRI AEROSPHERE) 160-9-4.8 MCG/ACT AERO Inhale 2 puffs into the lungs in the morning and at bedtime.   Budeson-Glycopyrrol-Formoterol (BREZTRI AEROSPHERE) 160-9-4.8 MCG/ACT AERO Inhale 2 puffs into the lungs in the morning and at bedtime.   DULoxetine (CYMBALTA) 60 MG capsule TAKE ONE CAPSULE BY MOUTH 2 TIMES A DAY.   empagliflozin (JARDIANCE) 10 MG TABS tablet Take 1 tablet (10 mg total) by mouth daily.   ezetimibe (ZETIA) 10 MG tablet Take 1 tablet (10 mg total) by mouth daily.   levothyroxine (SYNTHROID) 50 MCG tablet TAKE 1 TABLET IN THE MORNING BEFORE BREAKFAST.   midodrine (PROAMATINE) 5 MG tablet Take 1 tablet (5 mg total) by mouth 3 (three) times daily with meals.   mirtazapine (REMERON) 15 MG tablet TAKE (1) TABLET BY MOUTH AT BEDTIME.   nicotine (NICODERM CQ - DOSED IN MG/24 HR) 7 mg/24hr patch Place 1 patch (7 mg total) onto the skin daily.   oxyCODONE (OXY IR/ROXICODONE) 5 MG immediate release tablet Take 1 tablet (5 mg total) by mouth every 6 (six) hours as needed for severe pain.   pantoprazole (PROTONIX) 40  MG tablet TAKE ONE TABLET BY MOUTH TWICE DAILY BEFORE A MEAL.   pregabalin (LYRICA) 50 MG capsule TAKE 1 CAPSULE BY MOUTH IN THEMORNING AND 2 CAPSULES AT BEDTIME.   QUEtiapine (SEROQUEL) 50 MG tablet TAKE 1 OR 2 TABLETS BY MOUTH AT BEDTIME   torsemide (DEMADEX) 20 MG tablet Take 1 tablet (20 mg total) by mouth daily.   No facility-administered encounter medications on file as of 12/26/2020.    Patient Active Problem  List   Diagnosis Date Noted   Palliative care by specialist    Acute on chronic respiratory failure with hypoxia and hypercapnia ([place]) 07/26/2020   Acute on chronic combined systolic and diastolic CHF (congestive heart failure) ([place]) 07/26/2020   Idiopathic hypotension 10/08/2019   Encounter for long-term use of opiate analgesic 03/12/2019   IDA (iron deficiency anemia) 02/17/2019   Gastritis and gastroduodenitis 02/17/2019   COPD  GOLD II / still smoking  12/08/2018   Chronic respiratory failure with hypoxia and hypercapnia ([place]) 12/08/2018   Radiculopathy due to lumbar intervertebral disc disorder 10/29/2018   Sciatica of left side 10/29/2018   Anemia    Gastrointestinal hemorrhage    Elevated LFTs    Hypokalemia 08/24/2018   COPD exacerbation ([place]) 03/18/2014   Acute kidney injury ([place]) 03/18/2014   Acute respiratory failure with hypoxia ([place]) 03/18/2014   CAP (community acquired pneumonia) 03/17/2014   Pneumonia 03/17/2014   HTN (hypertension) 12/03/2012   Sepsis ([place]) 02/21/2012   PNA (pneumonia) 02/21/2012   Adrenal insufficiency ([place]) 02/21/2012   Subclavian steal syndrome 02/21/2012   Chronic pain 11/22/2010   Migraine headache 11/22/2010   Gastroesophageal reflux disease 11/22/2010   Goals of care, counseling/discussion 11/22/2010   Cerebrovascular disease 07/24/2010   Anxiety and depression    Hyperlipidemia    Hypothyroid    Cigarette smoker     Conditions to be addressed/monitored: Anxiety and Depression.  Limited Social Support, Mental Health Concerns, Social Isolation, Limited Access to Caregiver, and Lacks Knowledge of [employer].  Care Plan : LCSW Plan of Care  Updates made by Francis Gaines, LCSW since 12/26/2020 12:00 AM     Problem: Resume and Stick with Counseling to Reduce and Manage Symptoms of Anxiety and Depression.   Priority: High     Goal: Resume and Stick with Counseling to Reduce and Manage Symptoms of Anxiety and Depression.    Start Date: 12/05/2020  Expected End Date: 03/07/2021  This Visit's Progress: On track  Recent Progress: On track  Priority: High  Note:   Current Barriers:   Acute Mental Health needs related to Hypertension, COPD Gold II with Exacerbation, Chronic Respiratory Failure with Hypoxia and Hypercapnia, Anxiety and Depression requires Support, Education, Resources, Referrals, Advocacy, and Care Coordination, in order to meet unmet mental health needs. Clinical Goal(s):  Patient will work with LCSW to reduce and manage symptoms of Anxiety and Depression, until established with a community provider.   Patient will increase knowledge and/or ability of:  Coping Skills, Healthy Habits, Self-Management Skills, Stress Reduction, Home Safety and Utilizing [employer] and Resources.   Clinical Interventions:  Active Listening utilized, Emotional Support provided, Cognitive Behavioral Therapy initiated and Verbalization of Feelings encouraged.  Collaboration with Primary Care Physician, Dr. Lelon Frohlich regarding development and update of comprehensive plan of care as evidenced by provider attestation and co-signature. Collaboration with Primary Care Physician, Dr. Lelon Frohlich to inquire about  Inter-disciplinary care team collaboration (see longitudinal plan of care). Patient Goals/Self-Care Activities: Receive personal counseling  with LCSW, on a bi-weekly basis, to reduce and manage symptoms of Anxiety and Depression, until well-established with [place] for ongoing mental health counseling and supportive services. Please return all calls, contact number provided, as well as accept all calls, from [facility], in an effort to establish services, as representative reported that they have been trying to make contact with you by phone.   LCSW collaboration with Dr. Christinia Gully, Pulmonologist with [facility] at [place] [phone]), to request order for  portable oxygen carrier (Inogen Portable Oxygen Concentrator) be faxed to Maudie Mercury, Nurse with [facility] [phone]). Reviewed all upcoming scheduled provider appointments, to ensure accuracy of date, time, location and provider, to avoid future no-shows and/or last minute cancellations.   Contact LCSW directly (# [id number]) if you have questions, need assistance, or if additional social work needs are identified between now and our next scheduled telephone outreach call. Follow-Up:  01/09/2021 at 11:30 am   [place] Licensed Clinical Social Worker [place] [phone]

## 2020-12-27 ENCOUNTER — Telehealth: Payer: Self-pay

## 2020-12-27 NOTE — Telephone Encounter (Signed)
Patient is calling to request refill of Oxycodone. Last fill 11/17/20.

## 2021-01-01 ENCOUNTER — Other Ambulatory Visit: Payer: Self-pay | Admitting: *Deleted

## 2021-01-01 DIAGNOSIS — R931 Abnormal findings on diagnostic imaging of heart and coronary circulation: Secondary | ICD-10-CM

## 2021-01-01 DIAGNOSIS — I5042 Chronic combined systolic (congestive) and diastolic (congestive) heart failure: Secondary | ICD-10-CM

## 2021-01-02 ENCOUNTER — Encounter (HOSPITAL_COMMUNITY): Payer: Self-pay

## 2021-01-02 ENCOUNTER — Ambulatory Visit (HOSPITAL_COMMUNITY)
Admission: RE | Admit: 2021-01-02 | Discharge: 2021-01-02 | Disposition: A | Discharge status: Home / Self Care | Payer: Medicare Other | Source: Ambulatory Visit | Attending: Family Medicine | Admitting: Family Medicine

## 2021-01-02 ENCOUNTER — Other Ambulatory Visit: Payer: Self-pay

## 2021-01-02 ENCOUNTER — Encounter (HOSPITAL_COMMUNITY)
Admission: RE | Admit: 2021-01-02 | Discharge: 2021-01-02 | Disposition: A | Discharge status: Home / Self Care | Payer: Medicare Other | Source: Ambulatory Visit | Attending: Family Medicine | Admitting: Family Medicine

## 2021-01-02 DIAGNOSIS — R9439 Abnormal result of other cardiovascular function study: Secondary | ICD-10-CM

## 2021-01-02 DIAGNOSIS — I447 Left bundle-branch block, unspecified: Secondary | ICD-10-CM | POA: Diagnosis not present

## 2021-01-02 DIAGNOSIS — R931 Abnormal findings on diagnostic imaging of heart and coronary circulation: Secondary | ICD-10-CM | POA: Insufficient documentation

## 2021-01-02 LAB — NM MYOCAR MULTI W/SPECT W/WALL MOTION / EF
LV dias vol: 127 mL (ref 46–106)
LV sys vol: 80 mL
Nuc Stress EF: 37 %
Peak HR: 93 {beats}/min
RATE: 0.3
Rest HR: 83 {beats}/min
Rest Nuclear Isotope Dose: 9.3 mCi
SDS: 2
SRS: 7
SSS: 9
ST Depression (mm): 0 mm
Stress Nuclear Isotope Dose: 26.2 mCi
TID: 1.12

## 2021-01-02 LAB — BASIC METABOLIC PANEL
BUN/Creatinine Ratio: 16 (ref 12–28)
BUN: 22 mg/dL (ref 8–27)
CO2: 36 mmol/L — ABNORMAL HIGH (ref 20–29)
Calcium: 10.7 mg/dL — ABNORMAL HIGH (ref 8.7–10.3)
Chloride: 89 mmol/L — ABNORMAL LOW (ref 96–106)
Creatinine, Ser: 1.4 mg/dL — ABNORMAL HIGH (ref 0.57–1.00)
Glucose: 153 mg/dL — ABNORMAL HIGH (ref 70–99)
Potassium: 3.8 mmol/L (ref 3.5–5.2)
Sodium: 144 mmol/L (ref 134–144)
eGFR: 42 mL/min/{1.73_m2} — ABNORMAL LOW (ref >59)

## 2021-01-02 MED ORDER — TECHNETIUM TC 99M TETROFOSMIN IV KIT
30.0000 | PACK | Freq: Once | INTRAVENOUS | Status: AC | PRN
  Administered 2021-01-02: 26.2 via INTRAVENOUS

## 2021-01-02 MED ORDER — TECHNETIUM TC 99M TETROFOSMIN IV KIT
10.0000 | PACK | Freq: Once | INTRAVENOUS | Status: AC | PRN
  Administered 2021-01-02: 9.3 via INTRAVENOUS

## 2021-01-02 NOTE — Telephone Encounter (Signed)
Called and spoke to [place] and informed her of the information per Dr. Melvyn Novas. Di Kindle states the pt feels she is unable to leave her house for extended periods of time as her O2 doesn't last very long. Odis Hollingshead that the pt can be put on LB pulm O2 when she arrives for appt so the pt can conserve her own O2. Pt will need to be walked to see if she can qualify for POC. Will forward to [place] as FYI.

## 2021-01-02 NOTE — Telephone Encounter (Signed)
Noted! Thank you

## 2021-01-03 ENCOUNTER — Telehealth: Payer: Self-pay | Admitting: *Deleted

## 2021-01-03 DIAGNOSIS — J449 Chronic obstructive pulmonary disease, unspecified: Secondary | ICD-10-CM

## 2021-01-03 LAB — MAGNESIUM: Magnesium: 2 mg/dL (ref 1.6–2.3)

## 2021-01-03 NOTE — Telephone Encounter (Signed)
-----   Message from Satira Sark, MD sent at 01/03/2021  8:01 AM EST ----- Results reviewed. Magnesium level normal.

## 2021-01-03 NOTE — Telephone Encounter (Signed)
-----   Message from Beverly Sark, MD sent at 01/02/2021  8:06 AM EST ----- Results reviewed.  Lab work ordered by Mr. Leonides Sake NP.  Degree of renal dysfunction is relatively stable, CKD stage IIIb with creatinine 1.4 and normal potassium.  I would not make any medication adjustments for cardiomyopathy before seeing her back in the office.

## 2021-01-03 NOTE — Telephone Encounter (Signed)
-----   Message from Erma Heritage, [place] sent at 01/02/2021  7:36 PM EST ----- Ordered by Jonni Sanger - Please let the patient know her stress test showed areas concerning for possible prior infarction and given this along with her reduced ejection fraction, I did review with Dr. Domenic Polite who recommended a right and left heart catheterization for definitive evaluation. Would not change medical therapy at this time given her orthostatic hypotension but will readdress at follow-up. Please arrange for her to have a visit with Dr. Domenic Polite in [place] or myself or Selinda Eon in [place] if willing to travel to [place] (can use provider hold slot and would add to wait-list for the coming week).

## 2021-01-04 ENCOUNTER — Telehealth: Payer: Self-pay

## 2021-01-04 ENCOUNTER — Telehealth: Payer: Medicare Other

## 2021-01-04 NOTE — Telephone Encounter (Signed)
Patient informed and verbalized understanding of plan. Copy sent to PCP 

## 2021-01-04 NOTE — Telephone Encounter (Signed)
  Care Management   Follow Up Note   01/04/2021 Name: [name] MRN: [id number] DOB: May 27, 1955   Referred by: [name], [name], MD Reason for referral : Chronic Care Management (RNCM: Follow up Outreach Chronic Care Management and coordination needs-unsuccessful)   An unsuccessful telephone outreach was attempted today. The patient was referred to the case management team for assistance with care management and care coordination.   Follow Up Plan: The care management team will reach out to the patient again over the next 30 days.  Peter Garter RN, Jackquline Denmark, CDE Care Management Coordinator [facility] [phone], Mobile [phone]

## 2021-01-05 ENCOUNTER — Telehealth: Payer: Self-pay | Admitting: *Deleted

## 2021-01-05 NOTE — Chronic Care Management (AMB) (Signed)
  Care Management   Note  01/05/2021 Name: [name] MRN: [id number] DOB: 1955-05-08  [name] is a 65 y.o. year old female who is a primary care patient of [name], [name], MD and is actively engaged with the care management team. I reached out to [name] by phone today to assist with re-scheduling a follow up visit with the RN Case Manager  Follow up plan: Unsuccessful telephone outreach attempt made. A HIPAA compliant phone message was left for the patient providing contact information and requesting a return call.  The care management team will reach out to the patient again over the next 7 days.  If patient returns call to provider office, please advise to call [place] at [phone].  [place] Management  Direct Dial: [phone]

## 2021-01-08 ENCOUNTER — Other Ambulatory Visit: Payer: Self-pay | Admitting: Physical Medicine and Rehabilitation

## 2021-01-09 ENCOUNTER — Ambulatory Visit: Payer: Medicare Other | Admitting: *Deleted

## 2021-01-09 ENCOUNTER — Telehealth: Payer: Self-pay | Admitting: *Deleted

## 2021-01-09 DIAGNOSIS — J449 Chronic obstructive pulmonary disease, unspecified: Secondary | ICD-10-CM

## 2021-01-09 DIAGNOSIS — I679 Cerebrovascular disease, unspecified: Secondary | ICD-10-CM

## 2021-01-09 DIAGNOSIS — F32A Depression, unspecified: Secondary | ICD-10-CM

## 2021-01-09 DIAGNOSIS — F17210 Nicotine dependence, cigarettes, uncomplicated: Secondary | ICD-10-CM

## 2021-01-09 DIAGNOSIS — N179 Acute kidney failure, unspecified: Secondary | ICD-10-CM

## 2021-01-09 DIAGNOSIS — J441 Chronic obstructive pulmonary disease with (acute) exacerbation: Secondary | ICD-10-CM

## 2021-01-09 DIAGNOSIS — G894 Chronic pain syndrome: Secondary | ICD-10-CM

## 2021-01-09 DIAGNOSIS — Z7189 Other specified counseling: Secondary | ICD-10-CM

## 2021-01-09 DIAGNOSIS — J9601 Acute respiratory failure with hypoxia: Secondary | ICD-10-CM

## 2021-01-09 NOTE — Patient Instructions (Signed)
Visit Information  Thank you for taking time to visit with me today. Please don't hesitate to contact me if I can be of assistance to you before our next scheduled telephone appointment.  Following are the goals we discussed today:   Patient Goals/Self-Care Activities: Continue to receive personal counseling with LCSW, on a bi-weekly basis, to reduce and manage symptoms of Anxiety and Depression, until well-established with a community provider for ongoing mental health counseling and supportive services. Begin personal counseling with Vivi Martens, Licensed Professional Counselor with [facility] in [place], [place] (# [phone]) - matched community provider through [employer]. Keep scheduled appointments with cardiology and pulmonology, as a result of findings on recent Stress Test. Contact LCSW directly (# [phone]) if you have questions, need assistance, or if additional social work needs are identified between now and our next scheduled telephone outreach call.  Our next appointment is by telephone on 01/23/2021 at 11:15 am.  Please call the care guide team at [phone] if you need to cancel or reschedule your appointment.   If you are experiencing a Mental Health or [place] or need someone to talk to, please call the Suicide and Crisis Lifeline: 988 call the [place] National Suicide Prevention Lifeline: 985-337-0665 or TTY: 581-386-8691 TTY (802)400-2003) to talk to a trained counselor call 1-800-273-TALK (toll free, 24 hour hotline) go to [facility] [address], [place] [phone]) call the [place]: [phone] call 911   Patient verbalizes understanding of instructions provided today and agrees to view in [place].   Beverly Christen LCSW Licensed Clinical Social Worker [place] [phone]

## 2021-01-09 NOTE — Chronic Care Management (AMB) (Signed)
Chronic Care Management    Clinical Social Work Note  01/09/2021 Name: [name] MRN: [id number] DOB: [birth year]  [name] is a 65 y.o. year old female who is a primary care patient of [name], [name], MD. The CCM team was consulted to assist the patient with chronic disease management and/or care coordination needs related to: [employer] and [place] and Resources.   Engaged with patient by telephone for follow up visit in response to provider referral for social work chronic care management and care coordination services.   Consent to Services:  The patient was given information about Chronic Care Management services, agreed to services, and gave verbal consent prior to initiation of services.  Please see initial visit note for detailed documentation.   Patient agreed to services and consent obtained.   Assessment: Review of patient past medical history, allergies, medications, and health status, including review of relevant consultants reports was performed today as part of a comprehensive evaluation and provision of chronic care management and care coordination services.     SDOH (Social Determinants of Health) assessments and interventions performed:    Advanced Directives Status: Not addressed in this encounter.  CCM Care Plan  Allergies  Allergen Reactions   Mirtazapine Other (See Comments)    halucinations   Acetaminophen-Codeine Nausea And Vomiting   Aspirin Nausea And Vomiting    (can take coated aspirin)   Astelin [Azelastine]    Bactrim [Sulfamethoxazole-Trimethoprim]    Celecoxib Other (See Comments)    "does the opposite of what it is suppose to do"   Ciprofloxacin    Fentanyl    Levaquin [Levofloxacin In D5w] Itching   Lipitor [Atorvastatin] Other (See Comments)    Myalgias    Neomycin    Salicylates    Sulfa Antibiotics Nausea And Vomiting   Adhesive [Tape] Rash   Latex Rash   Tegretol [Carbamazepine]  Rash    Outpatient Encounter Medications as of 01/09/2021  Medication Sig   albuterol (PROAIR HFA) 108 (90 Base) MCG/ACT inhaler Inhale 2 puffs into the lungs every 4 (four) hours as needed for wheezing or shortness of breath (cough).   ALPRAZolam (XANAX) 0.5 MG tablet TAKE 1 TABLET BY MOUTH TWICE DAILY AS NEEDED FOR ANXIETY.   aspirin EC 81 MG tablet Take 81 mg by mouth daily.   Budeson-Glycopyrrol-Formoterol (BREZTRI AEROSPHERE) 160-9-4.8 MCG/ACT AERO Inhale 2 puffs into the lungs in the morning and at bedtime.   Budeson-Glycopyrrol-Formoterol (BREZTRI AEROSPHERE) 160-9-4.8 MCG/ACT AERO Inhale 2 puffs into the lungs in the morning and at bedtime.   DULoxetine (CYMBALTA) 60 MG capsule TAKE ONE CAPSULE BY MOUTH 2 TIMES A DAY.   empagliflozin (JARDIANCE) 10 MG TABS tablet Take 1 tablet (10 mg total) by mouth daily.   ezetimibe (ZETIA) 10 MG tablet Take 1 tablet (10 mg total) by mouth daily.   levothyroxine (SYNTHROID) 50 MCG tablet TAKE 1 TABLET IN THE MORNING BEFORE BREAKFAST.   midodrine (PROAMATINE) 5 MG tablet TAKE ONE TABLET BY MOUTH 3 TIMES A DAY WITH MEALS   mirtazapine (REMERON) 15 MG tablet TAKE (1) TABLET BY MOUTH AT BEDTIME.   nicotine (NICODERM CQ - DOSED IN MG/24 HR) 7 mg/24hr patch Place 1 patch (7 mg total) onto the skin daily.   oxyCODONE (OXY IR/ROXICODONE) 5 MG immediate release tablet TAKE (1) TABLET BY MOUTH EVERY SIX HOURS AS NEEDED FOR SEVERE PAIN.   pantoprazole (PROTONIX) 40 MG tablet TAKE ONE TABLET BY MOUTH TWICE DAILY BEFORE A MEAL.  pregabalin (LYRICA) 50 MG capsule TAKE 1 CAPSULE BY MOUTH IN THEMORNING AND 2 CAPSULES AT BEDTIME.   QUEtiapine (SEROQUEL) 50 MG tablet TAKE 1 OR 2 TABLETS BY MOUTH AT BEDTIME   torsemide (DEMADEX) 20 MG tablet Take 1 tablet (20 mg total) by mouth daily.   No facility-administered encounter medications on file as of 01/09/2021.    Patient Active Problem List   Diagnosis Date Noted   Palliative care by specialist    Acute on chronic  respiratory failure with hypoxia and hypercapnia ([place]) 07/26/2020   Acute on chronic combined systolic and diastolic CHF (congestive heart failure) ([place]) 07/26/2020   Idiopathic hypotension 10/08/2019   Encounter for long-term use of opiate analgesic 03/12/2019   IDA (iron deficiency anemia) 02/17/2019   Gastritis and gastroduodenitis 02/17/2019   COPD  GOLD II / still smoking  12/08/2018   Chronic respiratory failure with hypoxia and hypercapnia ([place]) 12/08/2018   Radiculopathy due to lumbar intervertebral disc disorder 10/29/2018   Sciatica of left side 10/29/2018   Anemia    Gastrointestinal hemorrhage    Elevated LFTs    Hypokalemia 08/24/2018   COPD exacerbation ([place]) 03/18/2014   Acute kidney injury ([place]) 03/18/2014   Acute respiratory failure with hypoxia ([place]) 03/18/2014   CAP (community acquired pneumonia) 03/17/2014   Pneumonia 03/17/2014   HTN (hypertension) 12/03/2012   Sepsis ([place]) 02/21/2012   PNA (pneumonia) 02/21/2012   Adrenal insufficiency ([place]) 02/21/2012   Subclavian steal syndrome 02/21/2012   Chronic pain 11/22/2010   Migraine headache 11/22/2010   Gastroesophageal reflux disease 11/22/2010   Goals of care, counseling/discussion 11/22/2010   Cerebrovascular disease 07/24/2010   Anxiety and depression    Hyperlipidemia    Hypothyroid    Cigarette smoker     Conditions to be addressed/monitored: Anxiety and Depression.  Limited Social Support, Mental Health Concerns, Social Isolation, Limited Access to Caregiver, and Lacks Knowledge of [employer].  Care Plan : LCSW Plan of Care  Updates made by Francis Gaines, LCSW since 01/09/2021 12:00 AM     Problem: Resume and Stick with Counseling to Reduce and Manage Symptoms of Anxiety and Depression.   Priority: High     Goal: Resume and Stick with Counseling to Reduce and Manage Symptoms of Anxiety and Depression.   Start Date: 12/05/2020  Expected End Date: 03/07/2021  This Visit's Progress: On  track  Recent Progress: On track  Priority: High  Note:   Current Barriers:   Acute Mental Health needs related to Hypertension, COPD Gold II with Exacerbation, Chronic Respiratory Failure with Hypoxia and Hypercapnia, Anxiety and Depression requires Support, Education, Resources, Referrals, Advocacy, and Care Coordination, in order to meet unmet mental health needs. Clinical Goal(s):  Patient will work with LCSW to reduce and manage symptoms of Anxiety and Depression, until established with a community provider.   Patient will increase knowledge and/or ability of:  Coping Skills, Healthy Habits, Self-Management Skills, Stress Reduction, Home Safety and Utilizing [employer] and Resources.   Clinical Interventions:  Active Listening/Reflection utilized, Emotional Support provided, Client-Centered Therapy initiated and Verbalization of Feelings encouraged.  Collaboration with Primary Care Physician, Dr. Lelon Frohlich regarding development and update of comprehensive plan of care as evidenced by provider attestation and co-signature. Inter-disciplinary care team collaboration (see longitudinal plan of care). Patient Goals/Self-Care Activities: Continue to receive personal counseling with LCSW, on a bi-weekly basis, to reduce and manage symptoms of Anxiety and Depression, until well-established with a community provider for ongoing mental health counseling  and supportive services. Begin personal counseling with Vivi Martens, Licensed Professional Counselor with [facility] in [place], [place] (# [phone]) - matched community provider through [employer]. Keep scheduled appointments with cardiology and pulmonology, as a result of findings on recent Stress Test. Contact LCSW directly (# [phone]) if you have questions, need assistance, or if additional social work needs are identified between now and our next scheduled telephone outreach  call. Follow-Up:  01/23/2021 at 11:15 am   [facility] Clinical Social Worker [place] [phone]

## 2021-01-09 NOTE — Telephone Encounter (Signed)
  Care Management   Follow Up Note   01/09/2021 Name: [name] MRN: [id number] DOB: [birth year]   Referred by: [name], [name], MD  Reason for referral : Chronic Care Management in Patient with Hypertension, COPD Gold II with Exacerbation, Chronic Respiratory Failure with Hypoxia and Hypercapnia, Anxiety and Depression.  An unsuccessful telephone outreach was attempted today. The patient was referred to the case management team for assistance with care management and care coordination. HIPAA compliant messages left on voicemail, including contact information, encouraging patient to return LCSW's call at her earliest convenience.  LCSW will make a second telephone follow-up outreach call attempt within the next 5-7 business days, if a return call is not received from patient in the meantime.  Follow-Up Plan:  Request placed to Scheduling Care Guides to reschedule patient's telephone follow-up outreach call with LCSW.  Nat Christen LCSW Licensed Clinical Social Worker [place] [phone]

## 2021-01-10 NOTE — Progress Notes (Signed)
Patient called [facility]  Direct Dial: [phone]

## 2021-01-11 ENCOUNTER — Ambulatory Visit: Payer: Medicare Other | Admitting: Student

## 2021-01-11 ENCOUNTER — Encounter: Payer: Self-pay | Admitting: Student

## 2021-01-11 VITALS — BP 108/64 | HR 50 | Ht 62.0 in | Wt 113.8 lb

## 2021-01-11 DIAGNOSIS — I951 Orthostatic hypotension: Secondary | ICD-10-CM | POA: Diagnosis not present

## 2021-01-11 DIAGNOSIS — I5022 Chronic systolic (congestive) heart failure: Secondary | ICD-10-CM

## 2021-01-11 DIAGNOSIS — R9439 Abnormal result of other cardiovascular function study: Secondary | ICD-10-CM | POA: Diagnosis not present

## 2021-01-11 DIAGNOSIS — E039 Hypothyroidism, unspecified: Secondary | ICD-10-CM

## 2021-01-11 DIAGNOSIS — I447 Left bundle-branch block, unspecified: Secondary | ICD-10-CM

## 2021-01-11 DIAGNOSIS — Z01818 Encounter for other preprocedural examination: Secondary | ICD-10-CM | POA: Diagnosis not present

## 2021-01-11 DIAGNOSIS — E782 Mixed hyperlipidemia: Secondary | ICD-10-CM

## 2021-01-11 NOTE — Progress Notes (Signed)
Cardiology Office Note    Date:  01/11/2021   ID:  Beverly, Smith [birth year], MRN [id number]  PCP:  [name], [name], MD  Cardiologist: Rozann Lesches, MD    Chief Complaint  Patient presents with   Follow-up    Abnormal Stress Test    History of Present Illness:    [name] is a 65 y.o. female with past medical history of HFrEF (EF 30-35% in 07/2020 and similar by repeat imaging in 11/2020), normal cors by cath in 2015, chronic LBBB, COPD, HLD, hypothyroidism, anxiety, chronic pain syndrome, left subclavian stenosis and tobacco use who presents to the office today for follow-up of an abnormal stress test.  She was hospitalized at [facility] in 07/2020 for an acute CHF exacerbation and repeat echocardiogram showed her EF had declined to 30 to 35% and was previously at 55% in 2020. Medical therapy was limited secondary to hypotension and she was discharged on ASA 81 mg daily, Jardiance 10 mg daily, Midodrine 5 mg TID and Torsemide 20 mg twice daily.  She did have follow-up with Katina Dung, NP in 09/2020 and had quit smoking and was using nicotine patches. Reported her breathing had been stable and denied any chest pain or palpitations. Her weight was stable at 109 lbs and she was continued on Jardiance but Torsemide was reduced to 20 mg once daily. She did have a repeat echocardiogram in 11/2020 and this showed her EF remained reduced at 30 to 35% with global hypokinesis and paradoxical septal motion in the setting of known LBBB. Given that her EF remained reduced, a stress test was recommended for further evaluation. This was performed in 12/2020 and showed a fixed anterior septal perfusion defect along with a fixed defect along the apex, basal to mid inferior septal wall and apical anterior wall. This was reviewed with Dr. Domenic Polite who recommended a cardiac catheterization for definitive evaluation.  In talking with the patient and her husband today, she reports  having baseline dyspnea on exertion which has overall been stable for the past few months. She is on 3.5 L nasal cannula at baseline. She denies any specific chest pain or palpitations. Does report two-pillow orthopnea along with intermittent PND. No recent lower extremity edema.  She has been taking Torsemide 20 mg daily as she experienced more frequent urination with higher dosing in the past. She has resumed smoking approximately 1/3 pack per day.   Past Medical History:  Diagnosis Date   Anemia    Anxiety and depression    Bronchitis    CHF (congestive heart failure) (HCC)    Chronic pain    COPD (chronic obstructive pulmonary disease) (HCC)    Gastroesophageal reflux disease    History of cardiac catheterization    Normal coronary arteries 2015   Hyperlipidemia    Hypertension    Hypothyroid    Migraine headache    Near syncope    On home oxygen therapy    Started 08/2018   Peripheral vascular disease ([place])    Pneumonia    Secondary cardiomyopathy ([place])    Tobacco abuse    1/2 pack per day   Uterine cancer [facility])     Past Surgical History:  Procedure Laterality Date   ABDOMINAL HYSTERECTOMY     without oophorectomy for neoplastic disease   BACK SURGERY     BIOPSY  10/22/2018   Procedure: BIOPSY;  Surgeon: Danie Binder, MD;  Location: [facility];  Service: Endoscopy;;  duodenal, gastric   CARDIAC CATHETERIZATION  07/2013   normal coronary arteries   CARPAL TUNNEL RELEASE     Left   CHOLECYSTECTOMY  2007   DILATION AND CURETTAGE OF UTERUS     ESOPHAGOGASTRODUODENOSCOPY (EGD) WITH PROPOFOL N/A 10/22/2018   gastritis (mild reactive gastropathy, negative duodenal biopsies for celiac).    LAPAROTOMY     LEFT HEART CATHETERIZATION WITH CORONARY ANGIOGRAM N/A 07/12/2013   Procedure: LEFT HEART CATHETERIZATION WITH CORONARY ANGIOGRAM;  Surgeon: Leonie Man, MD;  Location: [facility] CATH LAB;  Service: Cardiovascular;  Laterality: N/A;   LUMBAR LAMINECTOMY  x3   Left;  complicated by neurologic dysfunction   NASAL SINUS SURGERY      Current Medications: Outpatient Medications Prior to Visit  Medication Sig Dispense Refill   albuterol (PROAIR HFA) 108 (90 Base) MCG/ACT inhaler Inhale 2 puffs into the lungs every 4 (four) hours as needed for wheezing or shortness of breath (cough). 18 g 11   ALPRAZolam (XANAX) 0.5 MG tablet TAKE 1 TABLET BY MOUTH TWICE DAILY AS NEEDED FOR ANXIETY. 60 tablet 5   aspirin EC 81 MG tablet Take 81 mg by mouth daily.     Budeson-Glycopyrrol-Formoterol (BREZTRI AEROSPHERE) 160-9-4.8 MCG/ACT AERO Inhale 2 puffs into the lungs in the morning and at bedtime. 5.9 g 0   DULoxetine (CYMBALTA) 60 MG capsule TAKE ONE CAPSULE BY MOUTH 2 TIMES A DAY. 60 capsule 5   empagliflozin (JARDIANCE) 10 MG TABS tablet Take 1 tablet (10 mg total) by mouth daily. 90 tablet 3   ezetimibe (ZETIA) 10 MG tablet Take 1 tablet (10 mg total) by mouth daily. 30 tablet 6   levothyroxine (SYNTHROID) 50 MCG tablet TAKE 1 TABLET IN THE MORNING BEFORE BREAKFAST. 30 tablet 11   midodrine (PROAMATINE) 5 MG tablet TAKE ONE TABLET BY MOUTH 3 TIMES A DAY WITH MEALS 90 tablet 2   mirtazapine (REMERON) 15 MG tablet TAKE (1) TABLET BY MOUTH AT BEDTIME. 30 tablet 5   oxyCODONE (OXY IR/ROXICODONE) 5 MG immediate release tablet TAKE (1) TABLET BY MOUTH EVERY SIX HOURS AS NEEDED FOR SEVERE PAIN. 120 tablet 0   pantoprazole (PROTONIX) 40 MG tablet TAKE ONE TABLET BY MOUTH TWICE DAILY BEFORE A MEAL. 180 tablet 0   pregabalin (LYRICA) 50 MG capsule TAKE 1 CAPSULE BY MOUTH IN THEMORNING AND 2 CAPSULES AT BEDTIME. 90 capsule 5   QUEtiapine (SEROQUEL) 50 MG tablet TAKE 1 OR 2 TABLETS BY MOUTH AT BEDTIME 60 tablet 5   torsemide (DEMADEX) 20 MG tablet Take 1 tablet (20 mg total) by mouth daily. 90 tablet 1   Budeson-Glycopyrrol-Formoterol (BREZTRI AEROSPHERE) 160-9-4.8 MCG/ACT AERO Inhale 2 puffs into the lungs in the morning and at bedtime. (Patient not taking: Reported on 01/11/2021)  10.7 g 11   nicotine (NICODERM CQ - DOSED IN MG/24 HR) 7 mg/24hr patch Place 1 patch (7 mg total) onto the skin daily. (Patient not taking: Reported on 01/11/2021) 28 patch 2   No facility-administered medications prior to visit.     Allergies:   Mirtazapine, Acetaminophen-codeine, Aspirin, Astelin [azelastine], Bactrim [sulfamethoxazole-trimethoprim], Celecoxib, Ciprofloxacin, Fentanyl, Levaquin [levofloxacin in d5w], Lipitor [atorvastatin], Neomycin, Salicylates, Sulfa antibiotics, Adhesive [tape], Latex, and Tegretol [carbamazepine]   Social History   Socioeconomic History   Marital status: Married    Spouse name: Tiki Tucciarone   Number of children: 2   Years of education: 12   Highest education level: 12th grade  Occupational History    Comment: Disabled  Tobacco Use  Smoking status: Every Day    Packs/day: 0.25    Years: 40.00    Pack years: 10.00    Types: Cigarettes    Start date: 09/06/1972    Passive exposure: Current   Smokeless tobacco: Never   Tobacco comments:    Patient has 1-2 cigs a day and trying to quit.   Vaping Use   Vaping Use: Never used  Substance and Sexual Activity   Alcohol use: No    Alcohol/week: 0.0 standard drinks   Drug use: No   Sexual activity: Not Currently    Birth control/protection: Surgical  Other Topics Concern   Not on file  Social History Narrative   Not on file   Social Determinants of Health   Financial Resource Strain: Low Risk    Difficulty of Paying Living Expenses: Not very hard  Food Insecurity: No Food Insecurity   Worried About Running Out of Food in the Last Year: Never true   Ran Out of Food in the Last Year: Never true  Transportation Needs: No Transportation Needs   Lack of Transportation (Medical): No   Lack of Transportation (Non-Medical): No  Physical Activity: Inactive   Days of Exercise per Week: 0 days   Minutes of Exercise per Session: 0 min  Stress: No Stress Concern Present   Feeling of Stress : Only  a little  Social Connections: Engineer, building services of Communication with Friends and Family: More than three times a week   Frequency of Social Gatherings with Friends and Family: More than three times a week   Attends Religious Services: More than 4 times per year   Active Member of [employer] or Organizations: Yes   Attends Archivist Meetings: 1 to 4 times per year   Marital Status: Married     Family History:  The patient's family history includes Bleeding Disorder in her mother; Colon polyps in her mother; Deep vein thrombosis in her mother; Heart disease in her brother and mother; Hypertension in her brother, mother, and sister.   Review of Systems:    Please see the history of present illness.     All other systems reviewed and are otherwise negative except as noted above.   Physical Exam:    VS:  BP 108/64   Pulse (!) 50   Ht 5\' 2"  (1.575 m)   Wt 113 lb 12.8 oz (51.6 kg)   SpO2 95%   BMI 20.81 kg/m    General: Thin female appearing in no acute distress. Head: Normocephalic, atraumatic. Neck: No carotid bruits. JVD not elevated.  Lungs: Respirations regular and unlabored, without wheezing. Occasional rhonchi. On supplemental oxygen.  Heart: Regular rate and rhythm. No S3 or S4.  No murmur, no rubs, or gallops appreciated. Abdomen: Appears non-distended. No obvious abdominal masses. Msk:  Strength and tone appear normal for age. No obvious joint deformities or effusions. Extremities: No clubbing or cyanosis. No pitting edema.  Distal pedal pulses are 2+ bilaterally. Neuro: Alert and oriented X 3. Moves all extremities spontaneously. No focal deficits noted. Psych:  Responds to questions appropriately with a normal affect. Skin: No rashes or lesions noted  Wt Readings from Last 3 Encounters:  01/11/21 113 lb 12.8 oz (51.6 kg)  11/23/20 113 lb 6.4 oz (51.4 kg)  11/17/20 113 lb 12.8 oz (51.6 kg)     Studies/Labs Reviewed:   EKG:  EKG is not ordered  today. EKG from 01/02/2021 is reviewed and shows NSR, HR 80 with  LBBB.  Recent Labs: 07/26/2020: B Natriuretic Peptide 3,606.8 08/22/2020: ALT 9; Hemoglobin 13.0; Platelets 742.0 Repeated and verified X2. 11/17/2020: TSH 1.240 01/01/2021: BUN 22; Creatinine, Ser 1.40; Magnesium 2.0; Potassium 3.8; Sodium 144   Lipid Panel    Component Value Date/Time   CHOL 129 07/28/2020 0125   TRIG 108 07/28/2020 0125   HDL 28 (L) 07/28/2020 0125   CHOLHDL 4.6 07/28/2020 0125   VLDL 22 07/28/2020 0125   LDLCALC 79 07/28/2020 0125    Additional studies/ records that were reviewed today include:   Echocardiogram: 11/30/2020 IMPRESSIONS     1. Left ventricular ejection fraction, by estimation, is 30 to 35%. The  left ventricle has moderately decreased function. The left ventricle  demonstrates global hypokinesis with paradoxical septal motion in the  setting of LBBB. There is mild left  ventricular hypertrophy. Left ventricular diastolic parameters are  indeterminate. Global longitudinal strain measures not reported due to  incomplete tracking of myocardial segments.   2. Right ventricular systolic function is normal. The right ventricular  size is normal. There is normal pulmonary artery systolic pressure. The  estimated right ventricular systolic pressure is 91.9 mmHg.   3. The mitral valve is grossly normal. Trivial mitral valve  regurgitation.   4. The aortic valve is tricuspid. Aortic valve regurgitation is not  visualized.   5. The inferior vena cava is normal in size with greater than 50%  respiratory variability, suggesting right atrial pressure of 3 mmHg.   NST: 01/02/2021 Study is intermediate to high risk due to moderately reduced EF and multiple fixed perfusion defects suggestive of infarction.   No ST deviation was noted during stress. Occasional PVCs.   LV perfusion is abnormal. Defect 1: There is a medium defect with severe reduction in uptake present in the apical to basal  anteroseptal and inferoseptal location(s) that is fixed. There is paradoxical motion there which may be related to LBBB. Defect 2: There is a small defect with mild reduction in uptake present in the apical anterior location(s) that is fixed. There is abnormal wall motion in the defect area. Consistent with infarction. Defect 3: There is a small defect with severe reduction in uptake present in the apex location(s) that is fixed. There is abnormal wall motion in the defect area. Consistent with infarction.   Left ventricular function is abnormal. Global function is moderately reduced. End diastolic cavity size is mildly enlarged.   Compared to prior myoview report, there continues to be a fixed anteroseptal perfusion defect but now the apex, basal-to-mid inferoseptal wall and apical anterior wall also show a fixed defect.  Assessment:    1. Chronic systolic heart failure ([place])   2. Abnormal stress test   3. Pre-op evaluation   4. Orthostatic hypotension   5. LBBB (left bundle branch block)   6. Acquired hypothyroidism   7. Mixed hyperlipidemia      Plan:   In order of problems listed above:  1. HFrEF/Abnormal NST - She has a known cardiomyopathy with echocardiogram in 07/2020 showing a reduced EF of 30 to 35% and similar to repeat imaging in 11/2020. Recent NST did show fixed defects as outlined above and Dr. Domenic Polite recommended a catheterization for definitive evaluation.  - The procedure along with risks and benefits were reviewed and she is in agreement to proceed. The patient understands that risks include but are not limited to stroke (1 in 1000), death (1 in 49), kidney failure [usually temporary] (1 in 500), bleeding (1 in 200),  allergic reaction [possibly serious] (1 in 200). Will try to arrange within the next 1 to 2 weeks and she prefers to have this before Christmas. If her catheterization shows no significant abnormalities, could possibly try adding low-dose ARB but given her  soft BP at baseline and orthostatic hypotension, this will likely limit medication titration. No BB given HR in the 50's. Continue Torsemide 20mg  daily and Jardiance 10mg  daily. If EF remains reduced over time, she might be a candidate for CRT given her LBBB.   2. Orthostatic Hypotension - She still experiences occasional dizziness with positional changes but reports symptoms have improved. Remains on Midodrine 5 mg 3 times daily.    3. LBBB - Chronic with prior cath in 2015 showing normal cors. Will plan for repeat ischemic evaluation as outlined above.  4. Hypothyroidism - Followed by her PCP. TSH was stable at 1.240 by recent labs in 11/2020.  5. HLD - She has been intolerant to statins in the past and remains on Zetia 10 mg daily. Her LDL was down to 79 in 07/2020.   Medication Adjustments/Labs and Tests Ordered: Current medicines are reviewed at length with the patient today.  Concerns regarding medicines are outlined above.  Medication changes, Labs and Tests ordered today are listed in the Patient Instructions below. Patient Instructions  Medication Instructions:  Your physician recommends that you continue on your current medications as directed. Please refer to the Current Medication list given to you today.  *If you need a refill on your cardiac medications before your next appointment, please call your pharmacy*   Lab Work: CBC /BMET- 01/16/2021 If you have labs (blood work) drawn today and your tests are completely normal, you will receive your results only by: [place] (if you have MyChart) OR A paper copy in the mail If you have any lab test that is abnormal or we need to change your treatment, we will call you to review the results.   Testing/Procedures: R/L Heart Cath    Follow-Up: At [facility], you and your health needs are our priority.  As part of our continuing mission to provide you with exceptional heart care, we have created designated Provider  Care Teams.  These Care Teams include your primary Cardiologist (physician) and Advanced Practice Providers (APPs -  Physician Assistants and Nurse Practitioners) who all work together to provide you with the care you need, when you need it.  We recommend signing up for the patient portal called "MyChart".  Sign up information is provided on this After Visit Summary.  MyChart is used to connect with patients for Virtual Visits (Telemedicine).  Patients are able to view lab/test results, encounter notes, upcoming appointments, etc.  Non-urgent messages can be sent to your provider as well.   To learn more about what you can do with MyChart, go to [email or web address].    Your next appointment:   3 week(s)  The format for your next appointment:   In Person  Provider:   You may see Rozann Lesches, MD or one of the following Advanced Practice Providers on your designated Care Team:   Bernerd Pho, [place]  1}    Other Instructions   [place] [address] Dept: [phone] Loc: [place]  01/11/2021  You are scheduled for a Cardiac Catheterization on Thursday, December 15 with Dr. Peter Martinique.  1. Please arrive at the [facility] (Main Entrance A)  at [facility]: [address] [place], [place] [address] at 7:00 AM (This time is two hours before your procedure to ensure your preparation). Free valet parking service is available.   Special note: Every effort is made to have your procedure done on time. Please understand that emergencies sometimes delay scheduled procedures.  2. Diet: Do not eat solid foods after midnight.  The patient may have clear liquids until 5am upon the day of the procedure.  3. Labs: You will need to have blood drawn on Tuesday, December 13 at [facility]  4. Medication instructions in preparation for your procedure:    Contrast Allergy: No  HOLD TORSEMIDE TABLETS THE MORNING OF   On the morning of your procedure, take your Aspirin 81 mg tablet and any morning medicines NOT listed above.  You may use sips of water.  5. Plan for one night stay--bring personal belongings. 6. Bring a current list of your medications and current insurance cards. 7. You MUST have a responsible person to drive you home. 8. Someone MUST be with you the first 24 hours after you arrive home or your discharge will be delayed. 9. Please wear clothes that are easy to get on and off and wear slip-on shoes.  Thank you for allowing [place] to care for you!   -- [place] [facility]    Signed, Erma Heritage, Hershal Coria  01/11/2021 7:14 PM    [place] [address] [place], [place] [address] Phone: [phone] Fax: [phone]

## 2021-01-11 NOTE — H&P (View-Only) (Signed)
Cardiology Office Note    Date:  01/11/2021   ID:  [name], [name] 02-Apr-1955, MRN [id number]  PCP:  [name], [name], MD  Cardiologist: Rozann Lesches, MD    Chief Complaint  Patient presents with   Follow-up    Abnormal Stress Test    History of Present Illness:    [name] is a 65 y.o. female with past medical history of HFrEF (EF 30-35% in 07/2020 and similar by repeat imaging in 11/2020), normal cors by cath in 2015, chronic LBBB, COPD, HLD, hypothyroidism, anxiety, chronic pain syndrome, left subclavian stenosis and tobacco use who presents to the office today for follow-up of an abnormal stress test.  She was hospitalized at [facility] in 07/2020 for an acute CHF exacerbation and repeat echocardiogram showed her EF had declined to 30 to 35% and was previously at 55% in 2020. Medical therapy was limited secondary to hypotension and she was discharged on ASA 81 mg daily, Jardiance 10 mg daily, Midodrine 5 mg TID and Torsemide 20 mg twice daily.  She did have follow-up with Katina Dung, NP in 09/2020 and had quit smoking and was using nicotine patches. Reported her breathing had been stable and denied any chest pain or palpitations. Her weight was stable at 109 lbs and she was continued on Jardiance but Torsemide was reduced to 20 mg once daily. She did have a repeat echocardiogram in 11/2020 and this showed her EF remained reduced at 30 to 35% with global hypokinesis and paradoxical septal motion in the setting of known LBBB. Given that her EF remained reduced, a stress test was recommended for further evaluation. This was performed in 12/2020 and showed a fixed anterior septal perfusion defect along with a fixed defect along the apex, basal to mid inferior septal wall and apical anterior wall. This was reviewed with Dr. Domenic Polite who recommended a cardiac catheterization for definitive evaluation.  In talking with the patient and her husband today, she reports  having baseline dyspnea on exertion which has overall been stable for the past few months. She is on 3.5 L nasal cannula at baseline. She denies any specific chest pain or palpitations. Does report two-pillow orthopnea along with intermittent PND. No recent lower extremity edema.  She has been taking Torsemide 20 mg daily as she experienced more frequent urination with higher dosing in the past. She has resumed smoking approximately 1/3 pack per day.   Past Medical History:  Diagnosis Date   Anemia    Anxiety and depression    Bronchitis    CHF (congestive heart failure) (HCC)    Chronic pain    COPD (chronic obstructive pulmonary disease) (HCC)    Gastroesophageal reflux disease    History of cardiac catheterization    Normal coronary arteries 2015   Hyperlipidemia    Hypertension    Hypothyroid    Migraine headache    Near syncope    On home oxygen therapy    Started 08/2018   Peripheral vascular disease ([place])    Pneumonia    Secondary cardiomyopathy ([place])    Tobacco abuse    1/2 pack per day   Uterine cancer [facility])     Past Surgical History:  Procedure Laterality Date   ABDOMINAL HYSTERECTOMY     without oophorectomy for neoplastic disease   BACK SURGERY     BIOPSY  10/22/2018   Procedure: BIOPSY;  Surgeon: Danie Binder, MD;  Location: [facility];  Service: Endoscopy;;  duodenal, gastric   CARDIAC CATHETERIZATION  07/2013   normal coronary arteries   CARPAL TUNNEL RELEASE     Left   CHOLECYSTECTOMY  2007   DILATION AND CURETTAGE OF UTERUS     ESOPHAGOGASTRODUODENOSCOPY (EGD) WITH PROPOFOL N/A 10/22/2018   gastritis (mild reactive gastropathy, negative duodenal biopsies for celiac).    LAPAROTOMY     LEFT HEART CATHETERIZATION WITH CORONARY ANGIOGRAM N/A 07/12/2013   Procedure: LEFT HEART CATHETERIZATION WITH CORONARY ANGIOGRAM;  Surgeon: Leonie Man, MD;  Location: [facility] CATH LAB;  Service: Cardiovascular;  Laterality: N/A;   LUMBAR LAMINECTOMY  x3   Left;  complicated by neurologic dysfunction   NASAL SINUS SURGERY      Current Medications: Outpatient Medications Prior to Visit  Medication Sig Dispense Refill   albuterol (PROAIR HFA) 108 (90 Base) MCG/ACT inhaler Inhale 2 puffs into the lungs every 4 (four) hours as needed for wheezing or shortness of breath (cough). 18 g 11   ALPRAZolam (XANAX) 0.5 MG tablet TAKE 1 TABLET BY MOUTH TWICE DAILY AS NEEDED FOR ANXIETY. 60 tablet 5   aspirin EC 81 MG tablet Take 81 mg by mouth daily.     Budeson-Glycopyrrol-Formoterol (BREZTRI AEROSPHERE) 160-9-4.8 MCG/ACT AERO Inhale 2 puffs into the lungs in the morning and at bedtime. 5.9 g 0   DULoxetine (CYMBALTA) 60 MG capsule TAKE ONE CAPSULE BY MOUTH 2 TIMES A DAY. 60 capsule 5   empagliflozin (JARDIANCE) 10 MG TABS tablet Take 1 tablet (10 mg total) by mouth daily. 90 tablet 3   ezetimibe (ZETIA) 10 MG tablet Take 1 tablet (10 mg total) by mouth daily. 30 tablet 6   levothyroxine (SYNTHROID) 50 MCG tablet TAKE 1 TABLET IN THE MORNING BEFORE BREAKFAST. 30 tablet 11   midodrine (PROAMATINE) 5 MG tablet TAKE ONE TABLET BY MOUTH 3 TIMES A DAY WITH MEALS 90 tablet 2   mirtazapine (REMERON) 15 MG tablet TAKE (1) TABLET BY MOUTH AT BEDTIME. 30 tablet 5   oxyCODONE (OXY IR/ROXICODONE) 5 MG immediate release tablet TAKE (1) TABLET BY MOUTH EVERY SIX HOURS AS NEEDED FOR SEVERE PAIN. 120 tablet 0   pantoprazole (PROTONIX) 40 MG tablet TAKE ONE TABLET BY MOUTH TWICE DAILY BEFORE A MEAL. 180 tablet 0   pregabalin (LYRICA) 50 MG capsule TAKE 1 CAPSULE BY MOUTH IN THEMORNING AND 2 CAPSULES AT BEDTIME. 90 capsule 5   QUEtiapine (SEROQUEL) 50 MG tablet TAKE 1 OR 2 TABLETS BY MOUTH AT BEDTIME 60 tablet 5   torsemide (DEMADEX) 20 MG tablet Take 1 tablet (20 mg total) by mouth daily. 90 tablet 1   Budeson-Glycopyrrol-Formoterol (BREZTRI AEROSPHERE) 160-9-4.8 MCG/ACT AERO Inhale 2 puffs into the lungs in the morning and at bedtime. (Patient not taking: Reported on 01/11/2021)  10.7 g 11   nicotine (NICODERM CQ - DOSED IN MG/24 HR) 7 mg/24hr patch Place 1 patch (7 mg total) onto the skin daily. (Patient not taking: Reported on 01/11/2021) 28 patch 2   No facility-administered medications prior to visit.     Allergies:   Mirtazapine, Acetaminophen-codeine, Aspirin, Astelin [azelastine], Bactrim [sulfamethoxazole-trimethoprim], Celecoxib, Ciprofloxacin, Fentanyl, Levaquin [levofloxacin in d5w], Lipitor [atorvastatin], Neomycin, Salicylates, Sulfa antibiotics, Adhesive [tape], Latex, and Tegretol [carbamazepine]   Social History   Socioeconomic History   Marital status: Married    Spouse name: Marlynn Hinckley   Number of children: 2   Years of education: 12   Highest education level: 12th grade  Occupational History    Comment: Disabled  Tobacco Use  Smoking status: Every Day    Packs/day: 0.25    Years: 40.00    Pack years: 10.00    Types: Cigarettes    Start date: 09/06/1972    Passive exposure: Current   Smokeless tobacco: Never   Tobacco comments:    Patient has 1-2 cigs a day and trying to quit.   Vaping Use   Vaping Use: Never used  Substance and Sexual Activity   Alcohol use: No    Alcohol/week: 0.0 standard drinks   Drug use: No   Sexual activity: Not Currently    Birth control/protection: Surgical  Other Topics Concern   Not on file  Social History Narrative   Not on file   Social Determinants of Health   Financial Resource Strain: Low Risk    Difficulty of Paying Living Expenses: Not very hard  Food Insecurity: No Food Insecurity   Worried About Running Out of Food in the Last Year: Never true   Ran Out of Food in the Last Year: Never true  Transportation Needs: No Transportation Needs   Lack of Transportation (Medical): No   Lack of Transportation (Non-Medical): No  Physical Activity: Inactive   Days of Exercise per Week: 0 days   Minutes of Exercise per Session: 0 min  Stress: No Stress Concern Present   Feeling of Stress : Only  a little  Social Connections: Engineer, building services of Communication with Friends and Family: More than three times a week   Frequency of Social Gatherings with Friends and Family: More than three times a week   Attends Religious Services: More than 4 times per year   Active Member of [employer] or Organizations: Yes   Attends Archivist Meetings: 1 to 4 times per year   Marital Status: Married     Family History:  The patient's family history includes Bleeding Disorder in her mother; Colon polyps in her mother; Deep vein thrombosis in her mother; Heart disease in her brother and mother; Hypertension in her brother, mother, and sister.   Review of Systems:    Please see the history of present illness.     All other systems reviewed and are otherwise negative except as noted above.   Physical Exam:    VS:  BP 108/64    Pulse (!) 50    Ht 5\' 2"  (1.575 m)    Wt 113 lb 12.8 oz (51.6 kg)    SpO2 95%    BMI 20.81 kg/m    General: Thin female appearing in no acute distress. Head: Normocephalic, atraumatic. Neck: No carotid bruits. JVD not elevated.  Lungs: Respirations regular and unlabored, without wheezing. Occasional rhonchi. On supplemental oxygen.  Heart: Regular rate and rhythm. No S3 or S4.  No murmur, no rubs, or gallops appreciated. Abdomen: Appears non-distended. No obvious abdominal masses. Msk:  Strength and tone appear normal for age. No obvious joint deformities or effusions. Extremities: No clubbing or cyanosis. No pitting edema.  Distal pedal pulses are 2+ bilaterally. Neuro: Alert and oriented X 3. Moves all extremities spontaneously. No focal deficits noted. Psych:  Responds to questions appropriately with a normal affect. Skin: No rashes or lesions noted  Wt Readings from Last 3 Encounters:  01/11/21 113 lb 12.8 oz (51.6 kg)  11/23/20 113 lb 6.4 oz (51.4 kg)  11/17/20 113 lb 12.8 oz (51.6 kg)     Studies/Labs Reviewed:   EKG:  EKG is not ordered  today. EKG from 01/02/2021 is reviewed and  shows NSR, HR 80 with LBBB.  Recent Labs: 07/26/2020: B Natriuretic Peptide 3,606.8 08/22/2020: ALT 9; Hemoglobin 13.0; Platelets 742.0 Repeated and verified X2. 11/17/2020: TSH 1.240 01/01/2021: BUN 22; Creatinine, Ser 1.40; Magnesium 2.0; Potassium 3.8; Sodium 144   Lipid Panel    Component Value Date/Time   CHOL 129 07/28/2020 0125   TRIG 108 07/28/2020 0125   HDL 28 (L) 07/28/2020 0125   CHOLHDL 4.6 07/28/2020 0125   VLDL 22 07/28/2020 0125   LDLCALC 79 07/28/2020 0125    Additional studies/ records that were reviewed today include:   Echocardiogram: 11/30/2020 IMPRESSIONS     1. Left ventricular ejection fraction, by estimation, is 30 to 35%. The  left ventricle has moderately decreased function. The left ventricle  demonstrates global hypokinesis with paradoxical septal motion in the  setting of LBBB. There is mild left  ventricular hypertrophy. Left ventricular diastolic parameters are  indeterminate. Global longitudinal strain measures not reported due to  incomplete tracking of myocardial segments.   2. Right ventricular systolic function is normal. The right ventricular  size is normal. There is normal pulmonary artery systolic pressure. The  estimated right ventricular systolic pressure is 72.5 mmHg.   3. The mitral valve is grossly normal. Trivial mitral valve  regurgitation.   4. The aortic valve is tricuspid. Aortic valve regurgitation is not  visualized.   5. The inferior vena cava is normal in size with greater than 50%  respiratory variability, suggesting right atrial pressure of 3 mmHg.   NST: 01/02/2021 Study is intermediate to high risk due to moderately reduced EF and multiple fixed perfusion defects suggestive of infarction.   No ST deviation was noted during stress. Occasional PVCs.   LV perfusion is abnormal. Defect 1: There is a medium defect with severe reduction in uptake present in the apical to basal  anteroseptal and inferoseptal location(s) that is fixed. There is paradoxical motion there which may be related to LBBB. Defect 2: There is a small defect with mild reduction in uptake present in the apical anterior location(s) that is fixed. There is abnormal wall motion in the defect area. Consistent with infarction. Defect 3: There is a small defect with severe reduction in uptake present in the apex location(s) that is fixed. There is abnormal wall motion in the defect area. Consistent with infarction.   Left ventricular function is abnormal. Global function is moderately reduced. End diastolic cavity size is mildly enlarged.   Compared to prior myoview report, there continues to be a fixed anteroseptal perfusion defect but now the apex, basal-to-mid inferoseptal wall and apical anterior wall also show a fixed defect.  Assessment:    1. Chronic systolic heart failure ([place])   2. Abnormal stress test   3. Pre-op evaluation   4. Orthostatic hypotension   5. LBBB (left bundle branch block)   6. Acquired hypothyroidism   7. Mixed hyperlipidemia      Plan:   In order of problems listed above:  1. HFrEF/Abnormal NST - She has a known cardiomyopathy with echocardiogram in 07/2020 showing a reduced EF of 30 to 35% and similar to repeat imaging in 11/2020. Recent NST did show fixed defects as outlined above and Dr. Domenic Polite recommended a catheterization for definitive evaluation.  - The procedure along with risks and benefits were reviewed and she is in agreement to proceed. The patient understands that risks include but are not limited to stroke (1 in 1000), death (1 in 22), kidney failure [usually temporary] (1 in  500), bleeding (1 in 200), allergic reaction [possibly serious] (1 in 200). Will try to arrange within the next 1 to 2 weeks and she prefers to have this before Christmas. If her catheterization shows no significant abnormalities, could possibly try adding low-dose ARB but given her  soft BP at baseline and orthostatic hypotension, this will likely limit medication titration. No BB given HR in the 50's. Continue Torsemide 20mg  daily and Jardiance 10mg  daily. If EF remains reduced over time, she might be a candidate for CRT given her LBBB.   2. Orthostatic Hypotension - She still experiences occasional dizziness with positional changes but reports symptoms have improved. Remains on Midodrine 5 mg 3 times daily.    3. LBBB - Chronic with prior cath in 2015 showing normal cors. Will plan for repeat ischemic evaluation as outlined above.  4. Hypothyroidism - Followed by her PCP. TSH was stable at 1.240 by recent labs in 11/2020.  5. HLD - She has been intolerant to statins in the past and remains on Zetia 10 mg daily. Her LDL was down to 79 in 07/2020.   Medication Adjustments/Labs and Tests Ordered: Current medicines are reviewed at length with the patient today.  Concerns regarding medicines are outlined above.  Medication changes, Labs and Tests ordered today are listed in the Patient Instructions below. Patient Instructions  Medication Instructions:  Your physician recommends that you continue on your current medications as directed. Please refer to the Current Medication list given to you today.  *If you need a refill on your cardiac medications before your next appointment, please call your pharmacy*   Lab Work: CBC /BMET- 01/16/2021 If you have labs (blood work) drawn today and your tests are completely normal, you will receive your results only by: [place] (if you have MyChart) OR A paper copy in the mail If you have any lab test that is abnormal or we need to change your treatment, we will call you to review the results.   Testing/Procedures: R/L Heart Cath    Follow-Up: At [facility], you and your health needs are our priority.  As part of our continuing mission to provide you with exceptional heart care, we have created designated Provider  Care Teams.  These Care Teams include your primary Cardiologist (physician) and Advanced Practice Providers (APPs -  Physician Assistants and Nurse Practitioners) who all work together to provide you with the care you need, when you need it.  We recommend signing up for the patient portal called "MyChart".  Sign up information is provided on this After Visit Summary.  MyChart is used to connect with patients for Virtual Visits (Telemedicine).  Patients are able to view lab/test results, encounter notes, upcoming appointments, etc.  Non-urgent messages can be sent to your provider as well.   To learn more about what you can do with MyChart, go to [email or web address].    Your next appointment:   3 week(s)  The format for your next appointment:   In Person  Provider:   You may see Rozann Lesches, MD or one of the following Advanced Practice Providers on your designated Care Team:   Bernerd Pho, [place]  1}    Other Instructions   [place] [address] Dept: [phone] Loc: [place]  01/11/2021  You are scheduled for a Cardiac Catheterization on Thursday, December 15 with Dr. Peter Martinique.  1. Please arrive at the  [facility] (Main Entrance A) at [facility]: [address] [place], [place] [address] at 7:00 AM (This time is two hours before your procedure to ensure your preparation). Free valet parking service is available.   Special note: Every effort is made to have your procedure done on time. Please understand that emergencies sometimes delay scheduled procedures.  2. Diet: Do not eat solid foods after midnight.  The patient may have clear liquids until 5am upon the day of the procedure.  3. Labs: You will need to have blood drawn on Tuesday, December 13 at [facility]  4. Medication instructions in preparation for your procedure:    Contrast Allergy: No  HOLD TORSEMIDE TABLETS THE MORNING OF   On the morning of your procedure, take your Aspirin 81 mg tablet and any morning medicines NOT listed above.  You may use sips of water.  5. Plan for one night stay--bring personal belongings. 6. Bring a current list of your medications and current insurance cards. 7. You MUST have a responsible person to drive you home. 8. Someone MUST be with you the first 24 hours after you arrive home or your discharge will be delayed. 9. Please wear clothes that are easy to get on and off and wear slip-on shoes.  Thank you for allowing [place] to care for you!   -- [place] [facility]    Signed, Erma Heritage, Hershal Coria  01/11/2021 7:14 PM    [facility] [address] [place], [place] [address] Phone: [phone] Fax: [phone]

## 2021-01-11 NOTE — Patient Instructions (Signed)
Medication Instructions:  Your physician recommends that you continue on your current medications as directed. Please refer to the Current Medication list given to you today.  *If you need a refill on your cardiac medications before your next appointment, please call your pharmacy*   Lab Work: CBC /BMET- 01/16/2021 If you have labs (blood work) drawn today and your tests are completely normal, you will receive your results only by: [place] (if you have MyChart) OR A paper copy in the mail If you have any lab test that is abnormal or we need to change your treatment, we will call you to review the results.   Testing/Procedures: R/L Heart Cath    Follow-Up: At [facility], you and your health needs are our priority.  As part of our continuing mission to provide you with exceptional heart care, we have created designated Provider Care Teams.  These Care Teams include your primary Cardiologist (physician) and Advanced Practice Providers (APPs -  Physician Assistants and Nurse Practitioners) who all work together to provide you with the care you need, when you need it.  We recommend signing up for the patient portal called "MyChart".  Sign up information is provided on this After Visit Summary.  MyChart is used to connect with patients for Virtual Visits (Telemedicine).  Patients are able to view lab/test results, encounter notes, upcoming appointments, etc.  Non-urgent messages can be sent to your provider as well.   To learn more about what you can do with MyChart, go to [email or web address].    Your next appointment:   3 week(s)  The format for your next appointment:   In Person  Provider:   You may see Rozann Lesches, MD or one of the following Advanced Practice Providers on your designated Care Team:   Bernerd Pho, [place]  1}    Other Instructions   [place] [address] Dept: [phone] Loc: [address]  01/11/2021  You are scheduled for a Cardiac Catheterization on Thursday, December 15 with Dr. Peter Martinique.  1. Please arrive at the [facility] (Main Entrance A) at [facility]: [address] [place], [place] [address] at 7:00 AM (This time is two hours before your procedure to ensure your preparation). Free valet parking service is available.   Special note: Every effort is made to have your procedure done on time. Please understand that emergencies sometimes delay scheduled procedures.  2. Diet: Do not eat solid foods after midnight.  The patient may have clear liquids until 5am upon the day of the procedure.  3. Labs: You will need to have blood drawn on Tuesday, December 13 at [facility]  4. Medication instructions in preparation for your procedure:   Contrast Allergy: No  HOLD TORSEMIDE TABLETS THE MORNING OF   On the morning of your procedure, take your Aspirin 81 mg tablet and any morning medicines NOT listed above.  You may use sips of water.  5. Plan for one night stay--bring personal belongings. 6. Bring a current list of your medications and current insurance cards. 7. You MUST have a responsible person to drive you home. 8. Someone MUST be with you the first 24 hours after you arrive home or your discharge will be delayed. 9. Please wear clothes that are easy to get on and off and wear slip-on shoes.  Thank you for allowing [place] to care for you!   --  [place] [facility]

## 2021-01-12 ENCOUNTER — Telehealth: Payer: Self-pay | Admitting: Student

## 2021-01-12 ENCOUNTER — Telehealth: Payer: Self-pay | Admitting: Cardiology

## 2021-01-12 NOTE — Telephone Encounter (Signed)
Left message to return call 

## 2021-01-12 NOTE — Telephone Encounter (Signed)
Cath moved to Tuesday 01/30/21 at 0730 with Dr.Varanasi. Pt will have labs done at [facility] on 12/21

## 2021-01-12 NOTE — Telephone Encounter (Signed)
Spoke with pt, she would like to reschedule her heart cath to be done after the holidays. Will forward to Dr. Martinique and his nurse.

## 2021-01-12 NOTE — Telephone Encounter (Signed)
Patient would like to reschedule procedure on 01/18/21 with Dr. Martinique. She states her husband suggested rescheduling for after the holidays.

## 2021-01-12 NOTE — Telephone Encounter (Signed)
Pt was in the office on 01/11/21, she was given the AVS of Jamelle Rushing, pt wants to know if she should bring the paperwork back to the office or discard it in the garbage.  Pt says she had blood work recently, she wants to know why she's having more blood work on 01/16/21  Pt would like to wait until 2023 to go to the Cath Lab

## 2021-01-12 NOTE — Telephone Encounter (Signed)
OK with me. Seen by Bernerd Pho. If done before 4 weeks we wont need to have repeat H&P  PJ

## 2021-01-12 NOTE — Telephone Encounter (Signed)
Follow Up:      Patient is returning Cathy's call from today.

## 2021-01-15 NOTE — Telephone Encounter (Signed)
Left message for pt to call office back

## 2021-01-17 ENCOUNTER — Ambulatory Visit: Payer: Medicare Other | Admitting: Internal Medicine

## 2021-01-17 ENCOUNTER — Ambulatory Visit (HOSPITAL_COMMUNITY)
Admission: RE | Admit: 2021-01-17 | Discharge: 2021-01-17 | Disposition: A | Discharge status: Home / Self Care | Payer: Medicare Other | Source: Ambulatory Visit | Attending: Internal Medicine | Admitting: Internal Medicine

## 2021-01-17 ENCOUNTER — Other Ambulatory Visit: Payer: Self-pay

## 2021-01-17 ENCOUNTER — Encounter: Payer: Self-pay | Admitting: Internal Medicine

## 2021-01-17 VITALS — BP 112/64 | HR 85 | Temp 98.0°F | Ht 61.0 in | Wt 116.1 lb

## 2021-01-17 DIAGNOSIS — F17210 Nicotine dependence, cigarettes, uncomplicated: Secondary | ICD-10-CM | POA: Diagnosis not present

## 2021-01-17 DIAGNOSIS — J9612 Chronic respiratory failure with hypercapnia: Secondary | ICD-10-CM

## 2021-01-17 DIAGNOSIS — J449 Chronic obstructive pulmonary disease, unspecified: Secondary | ICD-10-CM | POA: Insufficient documentation

## 2021-01-17 DIAGNOSIS — R0902 Hypoxemia: Secondary | ICD-10-CM

## 2021-01-17 DIAGNOSIS — J9611 Chronic respiratory failure with hypoxia: Secondary | ICD-10-CM

## 2021-01-17 MED ORDER — AZITHROMYCIN 250 MG PO TABS: ORAL_TABLET | ORAL | 0 refills | Status: DC

## 2021-01-17 MED ORDER — PREDNISONE 10 MG PO TABS: ORAL_TABLET | ORAL | 0 refills | Status: AC

## 2021-01-17 NOTE — Patient Instructions (Signed)
We will adapt to do a best fit evaluation for portable oxygen   We will check you at rest and with walking at pace you use at grocery store   Work on inhaler technique:  relax and gently blow all the way out then take a nice smooth full deep breath back in, triggering the inhaler at same time you start breathing in.  Hold for up to 5 seconds if you can. Blow out thru nose. Rinse and gargle with water when done.  If mouth or throat bother you at all,  try brushing teeth/gums/tongue with arm and hammer toothpaste/ make a slurry and gargle and spit out.     Prednisone 10 mg take  4 each am x 2 days,   2 each am x 2 days,  1 each am x 2 days and stop   Zpak   Please remember to go to the x-ray department at [facility]   for your tests - we will call you with the results when they are available.  Please schedule a follow up office visit in 6 weeks, call sooner if needed - bring your medications with you

## 2021-01-17 NOTE — Progress Notes (Signed)
[name], female    DOB: 04-15-1955      MRN: [id number]   Brief patient profile:  88 yowf MM/active smoker with onset somewhat limited by both breathing and back/ legs x 2017  With GOLD II criteria by spirometry  07/17/15   rx with proair avg sev times a week at most no maint rx then acutely ill for a few days weakness > sob  before admitted [facility]  With new acute resp failure/ severe hypoxemia.     History of Present Illness  12/08/2018  Pulmonary/ 1st office eval/[name]  GOLD II/ still smoking on symb 41 'now and then"  And 02 24/7  Chief Complaint  Patient presents with   Pulmonary Consult    Referred by [name]. Pt c/o SOB since July 2020. She states she was hospitalized with CHF then and started on o2 3lpm continuous o2.   Dyspnea:  Better  But can't walk the dog yet, better on 02 3lpm cont Cough: none  Sleep: no resp symptoms  flat  SABA use: less than usual / nebulizer twice weekly at most  0 2 3lpm 24/7 does not titrate though has pulse ox rec Plan A = Automatic = Always=   Symbicort 160 Take 2 puffs first thing in am and then another 2 puffs about 12 hours later.   Work on inhaler technique:     Plan B = Backup (to supplement plan A, not to replace it) Only use your albuterol inhaler as a rescue medication to be used if you can't catch your breath    Plan C = Crisis (instead of Plan B but only if Plan B stops working) - only use your albuterol nebulizer if you first try Plan B and it fails to help > ok to use the nebulizer up to every 4 hours but if start needing it regularly call for immediate appointment  Goal is to keep your 02 saturation above 90%   The key is to stop smoking completely before smoking completely stops you!   07/21/2019  f/u ov/[name] re: GOLD II/ still smoking but cutting down/ unable to use hfa correctly  / had vaccines for COVID 16  Chief Complaint  Patient presents with   Follow-up    Breathing has been "off and on decent" c/o feeling light  headed.    Dyspnea:  Walking dog around the outside of the house Cough: "it's from my drainage from allergies better with nasal spray/ mucoid  Sleeping: flat bed 2 pillows  SABA use:  Used saba am of ov neb but not the symbicort yet  02: 3lpm continuous rec Plan A = Automatic = Always=    Trelegy one click first thing each am - take two good deep drags and hold for at least 5 5 seconds then out thru the nose Plan B = Backup (to supplement plan A, not to replace it) Only use your albuterol PROAIR/red inhaler as a rescue medication  Plan C = Crisis (instead of Plan B but only if Plan B stops working) - only use your albuterol nebulizer if you first try Plan B and it fails to help > ok to use the nebulizer up to every 4 hours but if start needing it regularly call for immediate appointment Will ask adapt to do a best fit evaluation for your portable system. Make sure you check your oxygen saturations at highest level of activity to be sure it stays over 90%.  Please schedule a  follow up office visit in 6 weeks, call sooner if needed - bring all your inhalers and nebulizer solutions.   09/01/2019  f/u ov/[name] re:  GOLD III copd / trelegy maint  Chief Complaint  Patient presents with   Follow-up    shortness of breath with exertion  Dyspnea:  Struggles to walk dog 100 ft on 3lpm pulsed but not checking sats  Cough: no  Sleeping: bed is flat 1-2 pillows  SABA use: much less now  02: 3 hs and up to 3 lpm walking  rec We will see if we can qualify you for POC thru [place]  Make sure you check your oxygen saturations at highest level of activity to be sure it stays over 90%  The key is to stop smoking completely before smoking completely stops you!   12/06/2019  f/u ov/[name] re:  trelegy  GOLD III copd/ 02 dep  Chief Complaint  Patient presents with   Follow-up    nasal congestion   Dyspnea:  750 feet to get mail  Cough: minimal am mucoid Sleeping one pillow bed flat  SABA  use: much less  02: 2lpm sitting  Usually 90s/ 3lpm at hs / not walking on 02 though  Rec The key is to stop smoking completely before smoking completely stops you! Make sure you check your oxygen saturations at highest level of activity     09/12/2020  f/u ov/[name]/[name] re: GOLD III copd/ 02 dep  maint on breztri but not consistent  with rx  Chief Complaint  Patient presents with   Follow-up    Breathing is overall doing well. She states she is using her proair inhaler about 2 x per wk.   Dyspnea:  has to stop several times to mb and back some hills  x 750 ft  Cough: none  Sleeping: bed is flat 2pillows  SABA use: 2 x weekly  02:  4lpm / no am ha  Covid status: vax x 4  Lung cancer screening: n/a   Rec Plan A = Automatic = Always=    Breztri Take 2 puffs first thing in am and then another 2 puffs about 12 hours later.   Work on inhaler technique:   Plan B = Backup (to supplement plan A, not to replace it) Only use your albuterol inhaler(PROAIR)  as a rescue medication  Make sure you check your oxygen saturation  at your highest level of activity  to be sure it stays over 90%     01/17/2021  f/u ov/[place] office/[name] re: COPD GOLD 3/ 02 dep  maint on Breztri / still smoking  Chief Complaint  Patient presents with   Follow-up    Lobby to room on room air O2 sats were 81%.  On 3LO2 cont. Sats increased to 91% after sitting.   Feels breathing has worsened since last OV. Hoping to get portable oxygen.   Dyspnea:  stopped mb and back 3 months prior to OV  /  pushing cart 2.5 cont stil able to foodlion but not checking sats as rec "afraid of using too much oxygen" Cough: mild cough/congestion but mucus is white  Sleeping: on side/ 2 pillows lots of am congestion brown tinged  SABA use: up to twice daily  02: 3.5 lpm conc at hs / 3.5 with during day at home but not titrating then using less when out of house from refillable tank Covid status: x 4     No obvious  day to  day or daytime variability or assoc  mucus plugs or hemoptysis or cp or chest tightness, subjective wheeze or overt sinus or hb symptoms.     Also denies any obvious fluctuation of symptoms with weather or environmental changes or other aggravating or alleviating factors except as outlined above   No unusual exposure hx or h/o childhood pna/ asthma or knowledge of premature birth.  Current Allergies, Complete Past Medical History, Past Surgical History, Family History, and Social History were reviewed in [employer] record.  ROS  The following are not active complaints unless bolded Hoarseness, sore throat, dysphagia, dental problems, itching, sneezing,  nasal congestion or discharge of excess mucus or purulent secretions, ear ache,   fever, chills, sweats, unintended wt loss or wt gain, classically pleuritic or exertional cp,  orthopnea pnd or arm/hand swelling  or leg swelling, presyncope, palpitations, abdominal pain, anorexia, nausea, vomiting, diarrhea  or change in bowel habits or change in bladder habits, change in stools or change in urine, dysuria, hematuria,  rash, arthralgias, visual complaints, headache, numbness, weakness or ataxia or problems with walking or coordination,  change in mood or  memory.        Current Meds  Medication Sig   albuterol (PROAIR HFA) 108 (90 Base) MCG/ACT inhaler Inhale 2 puffs into the lungs every 4 (four) hours as needed for wheezing or shortness of breath (cough).   ALPRAZolam (XANAX) 0.5 MG tablet TAKE 1 TABLET BY MOUTH TWICE DAILY AS NEEDED FOR ANXIETY.   aspirin EC 81 MG tablet Take 81 mg by mouth daily.   Budeson-Glycopyrrol-Formoterol (BREZTRI AEROSPHERE) 160-9-4.8 MCG/ACT AERO Inhale 2 puffs into the lungs in the morning and at bedtime.   Budeson-Glycopyrrol-Formoterol (BREZTRI AEROSPHERE) 160-9-4.8 MCG/ACT AERO Inhale 2 puffs into the lungs in the morning and at bedtime.   DULoxetine (CYMBALTA) 60 MG capsule TAKE ONE  CAPSULE BY MOUTH 2 TIMES A DAY.   empagliflozin (JARDIANCE) 10 MG TABS tablet Take 1 tablet (10 mg total) by mouth daily.   levothyroxine (SYNTHROID) 50 MCG tablet TAKE 1 TABLET IN THE MORNING BEFORE BREAKFAST.   midodrine (PROAMATINE) 5 MG tablet TAKE ONE TABLET BY MOUTH 3 TIMES A DAY WITH MEALS   mirtazapine (REMERON) 15 MG tablet TAKE (1) TABLET BY MOUTH AT BEDTIME.   nicotine (NICODERM CQ - DOSED IN MG/24 HR) 7 mg/24hr patch Place 1 patch (7 mg total) onto the skin daily.   oxyCODONE (OXY IR/ROXICODONE) 5 MG immediate release tablet TAKE (1) TABLET BY MOUTH EVERY SIX HOURS AS NEEDED FOR SEVERE PAIN.   pantoprazole (PROTONIX) 40 MG tablet TAKE ONE TABLET BY MOUTH TWICE DAILY BEFORE A MEAL.   pregabalin (LYRICA) 50 MG capsule TAKE 1 CAPSULE BY MOUTH IN THEMORNING AND 2 CAPSULES AT BEDTIME.   QUEtiapine (SEROQUEL) 50 MG tablet TAKE 1 OR 2 TABLETS BY MOUTH AT BEDTIME   torsemide (DEMADEX) 20 MG tablet Take 1 tablet (20 mg total) by mouth daily.                  Past Medical History:  Diagnosis Date   Anemia    Anxiety and depression    Bronchitis    Cancer ([place])    uterus   CHF (congestive heart failure) (HCC)    Chronic pain 11/22/2010   COPD (chronic obstructive pulmonary disease) (HCC)    Coronary artery disease    Gastroesophageal reflux disease 11/22/2010   Hyperlipidemia    Hypertension    Hypothyroid    Migraine headache  11/22/2010   Near syncope    On home oxygen therapy    started 08/2018   Peripheral vascular disease ([place])    Pneumonia    Tobacco abuse    1/2 pack per day         Objective:     01/17/2021    116 09/12/2020         110 12/06/2019       124   09/01/2019      124 07/21/19 124 lb (56.2 kg)  07/12/19 127 lb (57.6 kg)  06/11/19 127 lb (57.6 kg)    Vital signs reviewed  01/17/2021  - Note at rest 02 sats  91% on 3lpm cont    General appearance:    chronically ill elderly wf nad   Reports: Full dentures  HEENT : pt wearing mask not removed  for exam due to covid -19 concerns.    NECK :  without JVD/Nodes/TM/ nl carotid upstrokes bilaterally   LUNGS: no acc muscle use,  Mod barrel  contour chest wall with bilateral  Distant bs s audible wheeze and  without cough on insp or exp maneuvers and mod  Hyperresonant  to  percussion bilaterally     CV:  RRR  no s3 or murmur or increase in P2, and no edema   ABD:  soft and nontender with pos mid insp Hoover's  in the supine position. No bruits or organomegaly appreciated, bowel sounds nl  MS:     ext warm without deformities, calf tenderness, cyanosis or clubbing No obvious joint restrictions   SKIN: warm and dry without lesions    NEURO:  alert, approp, nl sensorium with  no motor or cerebellar deficits apparent.        CXR PA and Lateral:   01/17/2021 :    I personally reviewed images and impression is as follows:     Mild cm/ severe copd  No acute changes           Assessment

## 2021-01-18 ENCOUNTER — Other Ambulatory Visit (HOSPITAL_COMMUNITY)
Admission: RE | Admit: 2021-01-18 | Discharge: 2021-01-18 | Disposition: A | Discharge status: Home / Self Care | Payer: Medicare Other | Source: Ambulatory Visit | Attending: Cardiology | Admitting: Cardiology

## 2021-01-18 ENCOUNTER — Ambulatory Visit: Payer: Medicare Other | Admitting: Cardiology

## 2021-01-18 ENCOUNTER — Other Ambulatory Visit: Payer: Self-pay

## 2021-01-18 ENCOUNTER — Encounter: Payer: Self-pay | Admitting: Internal Medicine

## 2021-01-18 DIAGNOSIS — Z01818 Encounter for other preprocedural examination: Secondary | ICD-10-CM | POA: Insufficient documentation

## 2021-01-18 LAB — BASIC METABOLIC PANEL
Anion gap: 11 (ref 5–15)
BUN: 19 mg/dL (ref 8–23)
CO2: 40 mmol/L — ABNORMAL HIGH (ref 22–32)
Calcium: 9.6 mg/dL (ref 8.9–10.3)
Chloride: 83 mmol/L — ABNORMAL LOW (ref 98–111)
Creatinine, Ser: 1.41 mg/dL — ABNORMAL HIGH (ref 0.44–1.00)
GFR, Estimated: 42 mL/min — ABNORMAL LOW (ref >60)
Glucose, Bld: 140 mg/dL — ABNORMAL HIGH (ref 70–99)
Potassium: 4.1 mmol/L (ref 3.5–5.1)
Sodium: 134 mmol/L — ABNORMAL LOW (ref 135–145)

## 2021-01-18 LAB — CBC
HCT: 43 % (ref 36.0–46.0)
Hemoglobin: 13.4 g/dL (ref 12.0–15.0)
MCH: 30.8 pg (ref 26.0–34.0)
MCHC: 31.2 g/dL (ref 30.0–36.0)
MCV: 98.9 fL (ref 80.0–100.0)
Platelets: 284 10*3/uL (ref 150–400)
RBC: 4.35 MIL/uL (ref 3.87–5.11)
RDW: 14.1 % (ref 11.5–15.5)
WBC: 9.9 10*3/uL (ref 4.0–10.5)
nRBC: 0 % (ref 0.0–0.2)

## 2021-01-18 MED ORDER — BREZTRI AEROSPHERE 160-9-4.8 MCG/ACT IN AERO: 2.0000 | INHALATION_SPRAY | Freq: Two times a day (BID) | RESPIRATORY_TRACT | 0 refills | Status: AC

## 2021-01-18 NOTE — Assessment & Plan Note (Addendum)
Active smoker - Spirometry 07/17/15   FEV1 1.28 (53%)  Ratio 0.58 classic concave contour to f/v loop  - 12/08/2018   try increase symb to 80 2bid   -  Alpha one AT screen 12/08/2018  MM   Level 159  - 07/21/2019  After extensive coaching inhaler device,  effectiveness =    0% vs 75% with elipta so try change to trelegy if insurance covers - PFT's  08/23/20   FEV1 0.54 (23 % ) ratio 0.48  p 0 % improvement from saba p saba > 4 h prior to study with DLCO  6.84 (36%) corrects to 3.47 (80 %)  for alv volume and FV curve classic concave   - 01/17/2021  After extensive coaching inhaler device,  effectiveness =   60% from baseline 30%    Group D in terms of symptom/risk and laba/lama/ICS  therefore appropriate rx at this point >>>  Continue breztri and prn saba - consider change back to dpi or neb lama/laba/ics depending on affordability   For mild flare rx zpak and Prednisone 10 mg take  4 each am x 2 days,   2 each am x 2 days,  1 each am x 2 days and stop

## 2021-01-18 NOTE — Assessment & Plan Note (Signed)
D/c from [facility] 08/27/18 with HC03 30 on 3lpm  12/08/2018  Patient Saturations on Room Air at Rest = 94%, while Ambulating = 87% Patient Saturations on 3  Liters of pulsed oxygen while Ambulating = 95% -  12/06/2019   Required 4lpm pulsed to walk 250 ft moderate pace with sats 92% at end and denied sob but desaturated walking on 3lpm pulsed.  - 01/17/2021  Resting sats RA 80  Walked on 3lpm cont   x  3  lap(s) =  approx 450  ft  @ slow pace, stopped due to light headed > sob with lowest 02 sats 90%    Advised again: Make sure you check your oxygen saturation  AT  your highest level of activity (not after you stop)   to be sure it stays over 90% and adjust  02 flow upward to maintain this level if needed but remember to turn it back to previous settings when you stop (to conserve your supply).   Referred to [facility] for best fit eval/ discussed with Melissa at [facility] to have tech troubleshoot home 0 2 issues with tanks/ refills, etc

## 2021-01-18 NOTE — Assessment & Plan Note (Signed)
Counseled re importance of smoking cessation but did not meet time criteria for separate billing    Advised against using any cigs around 02    Each maintenance medication was reviewed in detail including emphasizing most importantly the difference between maintenance and prns and under what circumstances the prns are to be triggered using an action plan format where appropriate.  Total time for H and P, chart review, counseling, reviewing hfa/02 device(s) , directly observing portions of ambulatory 02 saturation study/ and generating customized AVS unique to this office visit / same day charting  > 40 min

## 2021-01-23 ENCOUNTER — Ambulatory Visit (INDEPENDENT_AMBULATORY_CARE_PROVIDER_SITE_OTHER): Payer: Medicare Other | Admitting: *Deleted

## 2021-01-23 DIAGNOSIS — I10 Essential (primary) hypertension: Secondary | ICD-10-CM

## 2021-01-23 DIAGNOSIS — G894 Chronic pain syndrome: Secondary | ICD-10-CM

## 2021-01-23 DIAGNOSIS — N179 Acute kidney failure, unspecified: Secondary | ICD-10-CM

## 2021-01-23 DIAGNOSIS — J9611 Chronic respiratory failure with hypoxia: Secondary | ICD-10-CM

## 2021-01-23 DIAGNOSIS — I679 Cerebrovascular disease, unspecified: Secondary | ICD-10-CM

## 2021-01-23 DIAGNOSIS — J441 Chronic obstructive pulmonary disease with (acute) exacerbation: Secondary | ICD-10-CM

## 2021-01-23 DIAGNOSIS — J449 Chronic obstructive pulmonary disease, unspecified: Secondary | ICD-10-CM

## 2021-01-23 DIAGNOSIS — F32A Depression, unspecified: Secondary | ICD-10-CM

## 2021-01-23 DIAGNOSIS — J9601 Acute respiratory failure with hypoxia: Secondary | ICD-10-CM

## 2021-01-23 NOTE — Chronic Care Management (AMB) (Signed)
Chronic Care Management    Clinical Social Work Note  01/23/2021 Name: [name] MRN: [id number] DOB: 1955/05/13  [name] is a 65 y.o. year old female who is a primary care patient of [name], [name], MD. The CCM team was consulted to assist the patient with chronic disease management and/or care coordination needs related to: Appointment Scheduling Needs, [employer], and Mental Health Counseling and Resources.   Engaged with patient by telephone for follow up visit in response to provider referral for social work chronic care management and care coordination services.   Consent to Services:  The patient was given information about Chronic Care Management services, agreed to services, and gave verbal consent prior to initiation of services.  Please see initial visit note for detailed documentation.   Patient agreed to services and consent obtained.   Assessment: Review of patient past medical history, allergies, medications, and health status, including review of relevant consultants reports was performed today as part of a comprehensive evaluation and provision of chronic care management and care coordination services.     SDOH (Social Determinants of Health) assessments and interventions performed:    Advanced Directives Status: Not addressed in this encounter.  CCM Care Plan  Allergies  Allergen Reactions   Mirtazapine Other (See Comments)    halucinations   Acetaminophen-Codeine Nausea And Vomiting   Aspirin Nausea And Vomiting    (can take coated aspirin)   Astelin [Azelastine]     Can not remember    Bactrim [Sulfamethoxazole-Trimethoprim]     Can not remember   Celecoxib Other (See Comments)    "does the opposite of what it is suppose to do"   Fentanyl     Can not remember   Levaquin [Levofloxacin In D5w] Itching   Lipitor [Atorvastatin] Other (See Comments)    Myalgias    Neomycin     Can not remember   Salicylates     Can  not remember   Sulfa Antibiotics Nausea And Vomiting   Adhesive [Tape] Rash   Ciprofloxacin Rash   Latex Rash   Tegretol [Carbamazepine] Rash    Outpatient Encounter Medications as of 01/23/2021  Medication Sig   albuterol (PROAIR HFA) 108 (90 Base) MCG/ACT inhaler Inhale 2 puffs into the lungs every 4 (four) hours as needed for wheezing or shortness of breath (cough).   ALPRAZolam (XANAX) 0.5 MG tablet TAKE 1 TABLET BY MOUTH TWICE DAILY AS NEEDED FOR ANXIETY.   aspirin EC 81 MG tablet Take 81 mg by mouth daily.   azithromycin (ZITHROMAX) 250 MG tablet Take 2 on day one then 1 daily x 4 days (Patient not taking: Reported on 01/19/2021)   Budeson-Glycopyrrol-Formoterol (BREZTRI AEROSPHERE) 160-9-4.8 MCG/ACT AERO Inhale 2 puffs into the lungs in the morning and at bedtime.   DULoxetine (CYMBALTA) 60 MG capsule TAKE ONE CAPSULE BY MOUTH 2 TIMES A DAY.   empagliflozin (JARDIANCE) 10 MG TABS tablet Take 1 tablet (10 mg total) by mouth daily.   ezetimibe (ZETIA) 10 MG tablet Take 10 mg by mouth daily.   levothyroxine (SYNTHROID) 50 MCG tablet TAKE 1 TABLET IN THE MORNING BEFORE BREAKFAST.   midodrine (PROAMATINE) 5 MG tablet TAKE ONE TABLET BY MOUTH 3 TIMES A DAY WITH MEALS   mirtazapine (REMERON) 15 MG tablet TAKE (1) TABLET BY MOUTH AT BEDTIME.   nicotine (NICODERM CQ - DOSED IN MG/24 HR) 7 mg/24hr patch Place 1 patch (7 mg total) onto the skin daily. (Patient not taking: Reported on  01/19/2021)   oxyCODONE (OXY IR/ROXICODONE) 5 MG immediate release tablet TAKE (1) TABLET BY MOUTH EVERY SIX HOURS AS NEEDED FOR SEVERE PAIN.   pantoprazole (PROTONIX) 40 MG tablet TAKE ONE TABLET BY MOUTH TWICE DAILY BEFORE A MEAL.   predniSONE (DELTASONE) 10 MG tablet Take  4 each am x 2 days,   2 each am x 2 days,  1 each am x 2 days and stop (Patient not taking: Reported on 01/19/2021)   pregabalin (LYRICA) 50 MG capsule TAKE 1 CAPSULE BY MOUTH IN THEMORNING AND 2 CAPSULES AT BEDTIME.   QUEtiapine (SEROQUEL) 50  MG tablet TAKE 1 OR 2 TABLETS BY MOUTH AT BEDTIME (Patient taking differently: Take 50 mg by mouth 3 (three) times daily.)   torsemide (DEMADEX) 20 MG tablet Take 1 tablet (20 mg total) by mouth daily.   No facility-administered encounter medications on file as of 01/23/2021.    Patient Active Problem List   Diagnosis Date Noted   Palliative care by specialist    Acute on chronic respiratory failure with hypoxia and hypercapnia ([place]) 07/26/2020   Acute on chronic combined systolic and diastolic CHF (congestive heart failure) ([place]) 07/26/2020   Idiopathic hypotension 10/08/2019   Encounter for long-term use of opiate analgesic 03/12/2019   IDA (iron deficiency anemia) 02/17/2019   Gastritis and gastroduodenitis 02/17/2019   COPD  GOLD II / still smoking  12/08/2018   Chronic respiratory failure with hypoxia and hypercapnia ([place]) 12/08/2018   Radiculopathy due to lumbar intervertebral disc disorder 10/29/2018   Sciatica of left side 10/29/2018   Anemia    Gastrointestinal hemorrhage    Elevated LFTs    Hypokalemia 08/24/2018   COPD exacerbation ([place]) 03/18/2014   Acute kidney injury ([place]) 03/18/2014   Acute respiratory failure with hypoxia ([place]) 03/18/2014   CAP (community acquired pneumonia) 03/17/2014   Pneumonia 03/17/2014   HTN (hypertension) 12/03/2012   Sepsis ([place]) 02/21/2012   PNA (pneumonia) 02/21/2012   Adrenal insufficiency ([place]) 02/21/2012   Subclavian steal syndrome 02/21/2012   Chronic pain 11/22/2010   Migraine headache 11/22/2010   Gastroesophageal reflux disease 11/22/2010   Goals of care, counseling/discussion 11/22/2010   Cerebrovascular disease 07/24/2010   Anxiety and depression    Hyperlipidemia    Hypothyroid    Cigarette smoker     Conditions to be addressed/monitored: Anxiety and Depression.  Limited Social Support, Mental Health Concerns, Family and Relationship Dysfunction, Social Isolation, Limited Access to Caregiver, Memory Deficits, and Lacks  Knowledge of [employer].  Care Plan : LCSW Plan of Care  Updates made by Francis Gaines, LCSW since 01/23/2021 12:00 AM     Problem: Resume and Stick with Counseling to Reduce and Manage Symptoms of Anxiety and Depression.   Priority: High     Goal: Resume and Stick with Counseling to Reduce and Manage Symptoms of Anxiety and Depression.   Start Date: 12/05/2020  Expected End Date: 03/07/2021  This Visit's Progress: On track  Recent Progress: On track  Priority: High  Note:   Current Barriers:   Acute Mental Health needs related to Hypertension, COPD Gold II with Exacerbation, Chronic Respiratory Failure with Hypoxia and Hypercapnia, Anxiety and Depression requires Support, Education, Resources, Referrals, Advocacy, and Care Coordination, in order to meet unmet mental health needs. Clinical Goal(s):  Patient will work with LCSW to reduce and manage symptoms of Anxiety and Depression, until established with a community provider.   Patient will increase knowledge and/or ability of:  Coping Skills, Healthy Habits, Self-Management  Skills, Stress Reduction, Animal nutritionist and Utilizing [employer] and Resources.   Clinical Interventions:  Active Listening/Reflection utilized, Engineer, petroleum provided, Client-Centered Therapy initiated, Cognitive Behavioral Therapy performed, and Verbalization of Feelings encouraged.  Collaboration with Primary Care Physician, Dr. Lelon Frohlich regarding development and update of comprehensive plan of care as evidenced by provider attestation and co-signature. Inter-disciplinary care team collaboration (see longitudinal plan of care). Patient Goals/Self-Care Activities: Continue to receive personal counseling with LCSW, on a bi-weekly basis, to reduce and manage symptoms of Anxiety and Depression, until well-established with Vivi Martens, Licensed Professional Counselor with [facility] in [place], [place] [phone]).   Please contact Vivi Martens, Licensed Professional Counselor with [facility] in [place], [place] - matched community provider through [employer] - in an effort to establish ongoing mental health counseling and supportive services.  ~  Contact LCSW to provide appointment time and date. Once you have established ongoing mental health counseling and supportive services with Vivi Martens, Licensed Professional Counselor with [facility] in [place], [place], Ms. Carlyon Prows will match you with a psychiatrist within the practice, to provide psychotropic medication management and monitoring. Contact LCSW directly (# [phone]) if you have questions, need assistance, or if additional social work needs are identified between now and our next scheduled telephone outreach call. Follow-Up:  02/22/2021 at 9:00 am   [place] Clinical Social Worker [place] [phone]

## 2021-01-23 NOTE — Patient Instructions (Signed)
Visit Information  Thank you for taking time to visit with me today. Please don't hesitate to contact me if I can be of assistance to you before our next scheduled telephone appointment.  Following are the goals we discussed today:   Patient Goals/Self-Care Activities: Continue to receive personal counseling with LCSW, on a bi-weekly basis, to reduce and manage symptoms of Anxiety and Depression, until well-established with Vivi Martens, Licensed Professional Counselor with [facility] in [place], [place] [phone]).   Please contact Vivi Martens, Licensed Professional Counselor with [facility] in [place], [place] - matched community provider through [employer] - in an effort to establish ongoing mental health counseling and supportive services.  ~  Contact LCSW to provide appointment time and date. Once you have established ongoing mental health counseling and supportive services with Vivi Martens, Licensed Professional Counselor with [facility] in [place], [place], Ms. Carlyon Prows will match you with a psychiatrist within the practice, to provide psychotropic medication management and monitoring. Contact LCSW directly (# [phone]) if you have questions, need assistance, or if additional social work needs are identified between now and our next scheduled telephone outreach call.  Follow-Up:  02/22/2021 at 9:00 am  Please call the care guide team at [phone] if you need to cancel or reschedule your appointment.   If you are experiencing a Mental Health or [place] or need someone to talk to, please call the Suicide and Crisis Lifeline: 988 call the [place] National Suicide Prevention Lifeline: (727)677-7759 or TTY: 949-710-5466 TTY 910-470-9845) to talk to a trained counselor call 1-800-273-TALK (toll free, 24 hour hotline) go to [facility] [address] [place] [phone]) call the [place]: [phone] call 911   Patient verbalizes understanding of instructions provided today and agrees to view in [place].   Nat Christen LCSW Licensed Clinical Social Worker [place] [phone]

## 2021-01-25 ENCOUNTER — Telehealth: Payer: Self-pay | Admitting: *Deleted

## 2021-01-25 NOTE — Telephone Encounter (Signed)
Per Bernerd Pho, PA-no LV gram to limit contrast, does not recommend pre-procedure hydration.

## 2021-01-25 NOTE — Telephone Encounter (Addendum)
Cardiac catheterization scheduled at [facility] for: Tuesday January 30, 2021 7:30 [facility] Main Entrance A [facility]) at: 5:30 AM *   Diet-no solid food after midnight prior to cath, clear liquids until 5 AM day of procedure.  Medication instructions for procedure: -Hold:  Torsemide-day before and day of cath-per protocol GFR 42  Jardiance-AM of procedure -Except hold medications usual morning medications can be taken pre-cath with sips of water including aspirin 81 mg.  Confirmed patient has responsible adult to drive home post procedure and be with patient first 24 hours after arriving home.  [facility] does allow one visitor to accompany you and wait in the hospital waiting room while you are there for your procedure. You and your visitor will be asked to wear a mask once you enter the hospital.   Patient reports does not currently have any new symptoms concerning for COVID-19 and no household members with COVID-19 like illness.              Reviewed procedure/mask/visitor instructions with patient.

## 2021-01-30 ENCOUNTER — Encounter (HOSPITAL_COMMUNITY): Payer: Self-pay | Admitting: Interventional Cardiology

## 2021-01-30 ENCOUNTER — Encounter (HOSPITAL_COMMUNITY)
Admission: RE | Disposition: A | Discharge status: Home / Self Care | Payer: Self-pay | Source: Home / Self Care | Attending: Interventional Cardiology

## 2021-01-30 ENCOUNTER — Other Ambulatory Visit: Payer: Self-pay

## 2021-01-30 ENCOUNTER — Ambulatory Visit (HOSPITAL_COMMUNITY)
Admission: RE | Admit: 2021-01-30 | Discharge: 2021-01-30 | Disposition: A | Discharge status: Home / Self Care | Payer: Medicare Other | Attending: Interventional Cardiology | Admitting: Interventional Cardiology

## 2021-01-30 DIAGNOSIS — I5022 Chronic systolic (congestive) heart failure: Secondary | ICD-10-CM | POA: Diagnosis not present

## 2021-01-30 DIAGNOSIS — E039 Hypothyroidism, unspecified: Secondary | ICD-10-CM | POA: Insufficient documentation

## 2021-01-30 DIAGNOSIS — Z87891 Personal history of nicotine dependence: Secondary | ICD-10-CM | POA: Diagnosis not present

## 2021-01-30 DIAGNOSIS — I951 Orthostatic hypotension: Secondary | ICD-10-CM | POA: Insufficient documentation

## 2021-01-30 DIAGNOSIS — E782 Mixed hyperlipidemia: Secondary | ICD-10-CM | POA: Insufficient documentation

## 2021-01-30 DIAGNOSIS — I429 Cardiomyopathy, unspecified: Secondary | ICD-10-CM | POA: Insufficient documentation

## 2021-01-30 DIAGNOSIS — R9439 Abnormal result of other cardiovascular function study: Secondary | ICD-10-CM | POA: Diagnosis not present

## 2021-01-30 DIAGNOSIS — F419 Anxiety disorder, unspecified: Secondary | ICD-10-CM | POA: Diagnosis not present

## 2021-01-30 DIAGNOSIS — R0602 Shortness of breath: Secondary | ICD-10-CM

## 2021-01-30 DIAGNOSIS — I2510 Atherosclerotic heart disease of native coronary artery without angina pectoris: Secondary | ICD-10-CM | POA: Diagnosis not present

## 2021-01-30 DIAGNOSIS — I110 Hypertensive heart disease with heart failure: Secondary | ICD-10-CM | POA: Insufficient documentation

## 2021-01-30 DIAGNOSIS — J449 Chronic obstructive pulmonary disease, unspecified: Secondary | ICD-10-CM | POA: Insufficient documentation

## 2021-01-30 DIAGNOSIS — G894 Chronic pain syndrome: Secondary | ICD-10-CM | POA: Diagnosis not present

## 2021-01-30 DIAGNOSIS — I442 Atrioventricular block, complete: Secondary | ICD-10-CM | POA: Insufficient documentation

## 2021-01-30 HISTORY — PX: RIGHT/LEFT HEART CATH AND CORONARY ANGIOGRAPHY: CATH118266

## 2021-01-30 LAB — POCT I-STAT EG7
Acid-Base Excess: 12 mmol/L — ABNORMAL HIGH (ref 0.0–2.0)
Acid-Base Excess: 12 mmol/L — ABNORMAL HIGH (ref 0.0–2.0)
Bicarbonate: 42.1 mmol/L — ABNORMAL HIGH (ref 20.0–28.0)
Bicarbonate: 42.3 mmol/L — ABNORMAL HIGH (ref 20.0–28.0)
Calcium, Ion: 1.34 mmol/L (ref 1.15–1.40)
Calcium, Ion: 1.35 mmol/L (ref 1.15–1.40)
HCT: 37 % (ref 36.0–46.0)
HCT: 37 % (ref 36.0–46.0)
Hemoglobin: 12.6 g/dL (ref 12.0–15.0)
Hemoglobin: 12.6 g/dL (ref 12.0–15.0)
O2 Saturation: 60 %
O2 Saturation: 61 %
Potassium: 4 mmol/L (ref 3.5–5.1)
Potassium: 4 mmol/L (ref 3.5–5.1)
Sodium: 139 mmol/L (ref 135–145)
Sodium: 140 mmol/L (ref 135–145)
TCO2: 45 mmol/L — ABNORMAL HIGH (ref 22–32)
TCO2: 45 mmol/L — ABNORMAL HIGH (ref 22–32)
pCO2, Ven: 83.3 mmHg (ref 44.0–60.0)
pCO2, Ven: 84.2 mmHg (ref 44.0–60.0)
pH, Ven: 7.309 (ref 7.250–7.430)
pH, Ven: 7.312 (ref 7.250–7.430)
pO2, Ven: 36 mmHg (ref 32.0–45.0)
pO2, Ven: 37 mmHg (ref 32.0–45.0)

## 2021-01-30 LAB — POCT I-STAT 7, (LYTES, BLD GAS, ICA,H+H)
Acid-Base Excess: 13 mmol/L — ABNORMAL HIGH (ref 0.0–2.0)
Bicarbonate: 42.1 mmol/L — ABNORMAL HIGH (ref 20.0–28.0)
Calcium, Ion: 1.34 mmol/L (ref 1.15–1.40)
HCT: 38 % (ref 36.0–46.0)
Hemoglobin: 12.9 g/dL (ref 12.0–15.0)
O2 Saturation: 99 %
Potassium: 4 mmol/L (ref 3.5–5.1)
Sodium: 139 mmol/L (ref 135–145)
TCO2: 44 mmol/L — ABNORMAL HIGH (ref 22–32)
pCO2 arterial: 77.5 mmHg (ref 32.0–48.0)
pH, Arterial: 7.343 — ABNORMAL LOW (ref 7.350–7.450)
pO2, Arterial: 131 mmHg — ABNORMAL HIGH (ref 83.0–108.0)

## 2021-01-30 SURGERY — RIGHT/LEFT HEART CATH AND CORONARY ANGIOGRAPHY
Anesthesia: LOCAL

## 2021-01-30 MED ORDER — ASPIRIN 81 MG PO CHEW
CHEWABLE_TABLET | ORAL | Status: AC
  Administered 2021-01-30: 07:00:00 81 mg via ORAL
  Filled 2021-01-30: qty 1

## 2021-01-30 MED ORDER — HYDRALAZINE HCL 20 MG/ML IJ SOLN: 10.0000 mg | INTRAMUSCULAR | Status: DC | PRN

## 2021-01-30 MED ORDER — SODIUM CHLORIDE 0.9% FLUSH: 3.0000 mL | Freq: Two times a day (BID) | INTRAVENOUS | Status: DC

## 2021-01-30 MED ORDER — IOHEXOL 350 MG/ML SOLN
INTRAVENOUS | Status: DC | PRN
  Administered 2021-01-30: 09:00:00 40 mL

## 2021-01-30 MED ORDER — MIDAZOLAM HCL 2 MG/2ML IJ SOLN
INTRAMUSCULAR | Status: AC
  Filled 2021-01-30: qty 2

## 2021-01-30 MED ORDER — HYDROMORPHONE HCL 1 MG/ML IJ SOLN
INTRAMUSCULAR | Status: DC | PRN
  Administered 2021-01-30 (×2): .5 mg via INTRAVENOUS

## 2021-01-30 MED ORDER — VERAPAMIL HCL 2.5 MG/ML IV SOLN
INTRAVENOUS | Status: DC | PRN
  Administered 2021-01-30: 2 mg via INTRAVENOUS

## 2021-01-30 MED ORDER — SODIUM CHLORIDE 0.9 % IV SOLN: INTRAVENOUS | Status: DC

## 2021-01-30 MED ORDER — ASPIRIN 81 MG PO CHEW: 81.0000 mg | CHEWABLE_TABLET | ORAL | Status: AC

## 2021-01-30 MED ORDER — HYDROMORPHONE HCL 1 MG/ML IJ SOLN
INTRAMUSCULAR | Status: AC
  Filled 2021-01-30: qty 0.5

## 2021-01-30 MED ORDER — LABETALOL HCL 5 MG/ML IV SOLN: 10.0000 mg | INTRAVENOUS | Status: DC | PRN

## 2021-01-30 MED ORDER — VERAPAMIL HCL 2.5 MG/ML IV SOLN
INTRAVENOUS | Status: DC | PRN
  Administered 2021-01-30: 08:00:00 10 mL via INTRA_ARTERIAL

## 2021-01-30 MED ORDER — ATROPINE SULFATE 1 MG/10ML IJ SOSY
PREFILLED_SYRINGE | INTRAMUSCULAR | Status: AC
  Filled 2021-01-30: qty 10

## 2021-01-30 MED ORDER — SODIUM CHLORIDE 0.9 % IV SOLN: INTRAVENOUS | Status: AC

## 2021-01-30 MED ORDER — ONDANSETRON HCL 4 MG/2ML IJ SOLN: 4.0000 mg | Freq: Four times a day (QID) | INTRAMUSCULAR | Status: DC | PRN

## 2021-01-30 MED ORDER — HEPARIN (PORCINE) IN NACL 1000-0.9 UT/500ML-% IV SOLN
INTRAVENOUS | Status: AC
  Filled 2021-01-30: qty 1000

## 2021-01-30 MED ORDER — SODIUM CHLORIDE 0.9 % IV SOLN: 250.0000 mL | INTRAVENOUS | Status: DC | PRN

## 2021-01-30 MED ORDER — LIDOCAINE HCL (PF) 1 % IJ SOLN
INTRAMUSCULAR | Status: AC
  Filled 2021-01-30: qty 30

## 2021-01-30 MED ORDER — ATROPINE SULFATE 1 MG/10ML IJ SOSY
PREFILLED_SYRINGE | INTRAMUSCULAR | Status: DC | PRN
  Administered 2021-01-30 (×4): .5 mg via INTRAVENOUS

## 2021-01-30 MED ORDER — VERAPAMIL HCL 2.5 MG/ML IV SOLN
INTRAVENOUS | Status: AC
  Filled 2021-01-30: qty 2

## 2021-01-30 MED ORDER — HEPARIN SODIUM (PORCINE) 1000 UNIT/ML IJ SOLN
INTRAMUSCULAR | Status: AC
  Filled 2021-01-30: qty 10

## 2021-01-30 MED ORDER — ACETAMINOPHEN 325 MG PO TABS: 650.0000 mg | ORAL_TABLET | ORAL | Status: DC | PRN

## 2021-01-30 MED ORDER — MIDAZOLAM HCL 2 MG/2ML IJ SOLN
INTRAMUSCULAR | Status: DC | PRN
  Administered 2021-01-30 (×2): 1 mg via INTRAVENOUS

## 2021-01-30 MED ORDER — HEPARIN SODIUM (PORCINE) 1000 UNIT/ML IJ SOLN
INTRAMUSCULAR | Status: DC | PRN
  Administered 2021-01-30: 2500 [IU] via INTRAVENOUS

## 2021-01-30 MED ORDER — LIDOCAINE HCL (PF) 1 % IJ SOLN
INTRAMUSCULAR | Status: DC | PRN
  Administered 2021-01-30: 2 mL
  Administered 2021-01-30: 1 mL

## 2021-01-30 MED ORDER — HEPARIN (PORCINE) IN NACL 1000-0.9 UT/500ML-% IV SOLN
INTRAVENOUS | Status: DC | PRN
  Administered 2021-01-30 (×2): 500 mL

## 2021-01-30 MED ORDER — SODIUM CHLORIDE 0.9% FLUSH: 3.0000 mL | INTRAVENOUS | Status: DC | PRN

## 2021-01-30 SURGICAL SUPPLY — 14 items
CATH BALLN WEDGE 5F 110CM (CATHETERS) ×2 IMPLANT
CATH INFINITI 5 FR JL3.5 (CATHETERS) ×2 IMPLANT
CATH INFINITI 5FR MULTPACK ANG (CATHETERS) ×2 IMPLANT
CATH INFINITI JR4 5F (CATHETERS) ×2 IMPLANT
ELECT DEFIB PAD ADLT CADENCE (PAD) ×2 IMPLANT
GLIDESHEATH SLEND SS 6F .021 (SHEATH) ×2 IMPLANT
GUIDEWIRE INQWIRE 1.5J.035X260 (WIRE) IMPLANT
INQWIRE 1.5J .035X260CM (WIRE) ×3
KIT HEART LEFT (KITS) ×3 IMPLANT
PACK CARDIAC CATHETERIZATION (CUSTOM PROCEDURE TRAY) ×3 IMPLANT
SHEATH GLIDE SLENDER 4/5FR (SHEATH) ×2 IMPLANT
TRANSDUCER W/STOPCOCK (MISCELLANEOUS) ×3 IMPLANT
TUBING CIL FLEX 10 FLL-RA (TUBING) ×3 IMPLANT
WIRE HI TORQ VERSACORE-J 145CM (WIRE) ×2 IMPLANT

## 2021-01-30 NOTE — Progress Notes (Signed)
Pt ambulated without difficulty or bleeding.   Discharged home with husband who will drive and stay with pt x 24 hrs.  Pt seen by Vin,PA who approved discharge.  Pt in [facility]

## 2021-01-30 NOTE — Interval H&P Note (Signed)
Cath Lab Visit (complete for each Cath Lab visit)  Clinical Evaluation Leading to the Procedure:   ACS: No.  Non-ACS:    Anginal Classification: CCS III  Anti-ischemic medical therapy: Minimal Therapy (1 class of medications)  Non-Invasive Test Results: High-risk stress test findings: cardiac mortality >3%/year  Prior CABG: No previous CABG      History and Physical Interval Note:  01/30/2021 7:37 AM  [name]  has presented today for surgery, with the diagnosis of abnormal stress test.  The various methods of treatment have been discussed with the patient and family. After consideration of risks, benefits and other options for treatment, the patient has consented to  Procedure(s): RIGHT/LEFT HEART CATH AND CORONARY ANGIOGRAPHY (N/A) as a surgical intervention.  The patient's history has been reviewed, patient examined, no change in status, stable for surgery.  I have reviewed the patient's chart and labs.  Questions were answered to the patient's satisfaction.     Larae Grooms

## 2021-01-30 NOTE — Discharge Instructions (Signed)
Radial Site Care  This sheet gives you information about how to care for yourself after your procedure. Your health care provider may also give you more specific instructions. If you have problems or questions, contact your health care provider. What can I expect after the procedure? After the procedure, it is common to have: Bruising and tenderness at the catheter insertion area. Follow these instructions at home: Medicines Take over-the-counter and prescription medicines only as told by your health care provider. Insertion site care Follow instructions from your health care provider about how to take care of your insertion site. Make sure you: Wash your hands with soap and water before you remove your bandage (dressing). If soap and water are not available, use hand sanitizer. May remove dressing in 24 hours. Check your insertion site every day for signs of infection. Check for: Redness, swelling, or pain. Fluid or blood. Pus or a bad smell. Warmth. Do no take baths, swim, or use a hot tub for 5 days. You may shower 24-48 hours after the procedure. Remove the dressing and gently wash the site with plain soap and water. Pat the area dry with a clean towel. Do not rub the site. That could cause bleeding. Do not apply powder or lotion to the site. Activity  For 24 hours after the procedure, or as directed by your health care provider: Do not flex or bend the affected arm. Do not push or pull heavy objects with the affected arm. Do not drive yourself home from the hospital or clinic. You may drive 24 hours after the procedure. Do not operate machinery or power tools. KEEP ARM ELEVATED THE REMAINDER OF THE DAY. Do not push, pull or lift anything that is heavier than 10 lb for 5 days. Ask your health care provider when it is okay to: Return to work or school. Resume usual physical activities or sports. Resume sexual activity. General instructions If the catheter site starts to  bleed, raise your arm and put firm pressure on the site. If the bleeding does not stop, get help right away. This is a medical emergency. DRINK PLENTY OF FLUIDS FOR THE NEXT 2-3 DAYS. No alcohol consumption for 24 hours after receiving sedation. If you went home on the same day as your procedure, a responsible adult should be with you for the first 24 hours after you arrive home. Keep all follow-up visits as told by your health care provider. This is important. Contact a health care provider if: You have a fever. You have redness, swelling, or yellow drainage around your insertion site. Get help right away if: You have unusual pain at the radial site. The catheter insertion area swells very fast. The insertion area is bleeding, and the bleeding does not stop when you hold steady pressure on the area. Your arm or hand becomes pale, cool, tingly, or numb. These symptoms may represent a serious problem that is an emergency. Do not wait to see if the symptoms will go away. Get medical help right away. Call your local emergency services (911 in the U.S.). Do not drive yourself to the hospital. Summary After the procedure, it is common to have bruising and tenderness at the site. Follow instructions from your health care provider about how to take care of your radial site wound. Check the wound every day for signs of infection.  This information is not intended to replace advice given to you by your health care provider. Make sure you discuss any questions you have with   your health care provider. Document Revised: 02/26/2017 Document Reviewed: 02/26/2017 Elsevier Patient Education  2020 Elsevier Inc.  

## 2021-02-01 ENCOUNTER — Telehealth: Payer: Self-pay | Admitting: *Deleted

## 2021-02-01 ENCOUNTER — Telehealth: Payer: Self-pay | Admitting: Pharmacist

## 2021-02-01 NOTE — Chronic Care Management (AMB) (Signed)
Chronic Care Management Pharmacy Assistant   Name: Filippa Yarbough  MRN: [id number] DOB: 08-21-1955  Reason for Encounter: Judithann Sauger Patient assistance follow up   Recent office visits:  01/23/21 CCM Visit with LCSW  12/26/20 CCM Visit with LCSW  Recent consult visits:  01/18/21 Satira Sark, MD [facility]) - Patient presented for Pre-Op  01/17/21 Tanda Rockers, MD (Pulmonology) - Patient presented for Oxygen desaturation and other concerns. Prescribed Azithromycin 250 mg and Prednisone 10 mg.  01/02/21 Verta Ellen., NP (Cardiac rehab) - Patient presented for Myocardial perfusion Hospital visits:  Medication Reconciliation was completed by comparing discharge summary, patients EMR and Pharmacy list, and upon discussion with patient.  Patient presented to [facility] on  01/30/21 due to RIGHT/LEFT HEART CATH AND CORONARY ANGIOGRAPHY. Patient was present for 8 hours.  New?Medications Started at [facility] Discharge:?? -started  None  Medication Changes at Hospital Discharge: -Changed  None  Medications Discontinued at Hospital Discharge: -Stopped  azithromycin 250 MG tablet  Medications that remain the same after Hospital Discharge:??  -All other medications will remain the same.    Medications: Outpatient Encounter Medications as of 02/01/2021  Medication Sig   albuterol (PROAIR HFA) 108 (90 Base) MCG/ACT inhaler Inhale 2 puffs into the lungs every 4 (four) hours as needed for wheezing or shortness of breath (cough).   ALPRAZolam (XANAX) 0.5 MG tablet TAKE 1 TABLET BY MOUTH TWICE DAILY AS NEEDED FOR ANXIETY.   aspirin EC 81 MG tablet Take 81 mg by mouth daily.   Budeson-Glycopyrrol-Formoterol (BREZTRI AEROSPHERE) 160-9-4.8 MCG/ACT AERO Inhale 2 puffs into the lungs in the morning and at bedtime.   DULoxetine (CYMBALTA) 60 MG capsule TAKE ONE CAPSULE BY MOUTH 2 TIMES A DAY.   empagliflozin (JARDIANCE) 10 MG TABS tablet Take 1 tablet  (10 mg total) by mouth daily.   ezetimibe (ZETIA) 10 MG tablet Take 10 mg by mouth daily.   levothyroxine (SYNTHROID) 50 MCG tablet TAKE 1 TABLET IN THE MORNING BEFORE BREAKFAST.   midodrine (PROAMATINE) 5 MG tablet TAKE ONE TABLET BY MOUTH 3 TIMES A DAY WITH MEALS   mirtazapine (REMERON) 15 MG tablet TAKE (1) TABLET BY MOUTH AT BEDTIME.   nicotine (NICODERM CQ - DOSED IN MG/24 HR) 7 mg/24hr patch Place 1 patch (7 mg total) onto the skin daily. (Patient not taking: Reported on 01/19/2021)   oxyCODONE (OXY IR/ROXICODONE) 5 MG immediate release tablet TAKE (1) TABLET BY MOUTH EVERY SIX HOURS AS NEEDED FOR SEVERE PAIN.   pantoprazole (PROTONIX) 40 MG tablet TAKE ONE TABLET BY MOUTH TWICE DAILY BEFORE A MEAL.   predniSONE (DELTASONE) 10 MG tablet Take  4 each am x 2 days,   2 each am x 2 days,  1 each am x 2 days and stop (Patient not taking: Reported on 01/19/2021)   pregabalin (LYRICA) 50 MG capsule TAKE 1 CAPSULE BY MOUTH IN THEMORNING AND 2 CAPSULES AT BEDTIME.   QUEtiapine (SEROQUEL) 50 MG tablet TAKE 1 OR 2 TABLETS BY MOUTH AT BEDTIME (Patient taking differently: Take 50 mg by mouth 3 (three) times daily.)   torsemide (DEMADEX) 20 MG tablet Take 1 tablet (20 mg total) by mouth daily.   No facility-administered encounter medications on file as of 02/01/2021.  Notes: Call to [facility] and me to check the status of Breztri Patient Assistance Application, was advised patient is approved for the year 2023 and her next shipment will be on 03/30/21. Call to patient to advise  and she confirmed receipt of her last shipment on Dec 05 2020.  Care Gaps: BP- 108/64 (office 01/11/21) PAP Smear - Overdue AWV - office aware to schedule as of 11/23 CCM - 2/23  Star Rating Drugs: Empagliflozin (Jardiance ) 10 mg - Last filled 01/08/2021 30 DS At [facility]  Patient Assistance: Judithann Sauger [facility] &[facility])- Approved for 2023 next shipment 03/30/21  [place] Clinical Pharmacist Assistant [phone]

## 2021-02-01 NOTE — Chronic Care Management (AMB) (Signed)
°  Care Management   Note  02/01/2021 Name: Abbeygail Igoe MRN: [id number] DOB: Sep 26, 1955  [name] is a 65 y.o. year old female who is a primary care patient of [name], [name], MD and is actively engaged with the care management team. I reached out to [name] by phone today to assist with re-scheduling a follow up visit with the Licensed Clinical Social Worker  Follow up plan: Unsuccessful telephone outreach attempt made. A HIPAA compliant phone message was left for the patient providing contact information and requesting a return call.  The care management team will reach out to the patient again over the next 7 days.  If patient returns call to provider office, please advise to call [place] at [place] [facility]  Direct Dial: [phone]

## 2021-02-02 ENCOUNTER — Telehealth: Payer: Self-pay | Admitting: *Deleted

## 2021-02-02 MED ORDER — OXYCODONE HCL 5 MG PO TABS: ORAL_TABLET | ORAL | 0 refills | Status: DC

## 2021-02-02 NOTE — Telephone Encounter (Signed)
Notified and warned not to request narcotic refills on Fridays per policy.

## 2021-02-02 NOTE — Telephone Encounter (Signed)
Maxene decided to call on Friday before a holiday at 3:23 pm for her pain medication.

## 2021-02-03 DIAGNOSIS — I10 Essential (primary) hypertension: Secondary | ICD-10-CM

## 2021-02-03 DIAGNOSIS — J449 Chronic obstructive pulmonary disease, unspecified: Secondary | ICD-10-CM | POA: Diagnosis not present

## 2021-02-03 DIAGNOSIS — F419 Anxiety disorder, unspecified: Secondary | ICD-10-CM

## 2021-02-03 DIAGNOSIS — J441 Chronic obstructive pulmonary disease with (acute) exacerbation: Secondary | ICD-10-CM

## 2021-02-03 DIAGNOSIS — N179 Acute kidney failure, unspecified: Secondary | ICD-10-CM

## 2021-02-03 DIAGNOSIS — F32A Depression, unspecified: Secondary | ICD-10-CM

## 2021-02-03 NOTE — Progress Notes (Signed)
[facility] RN NOTE  PATIENT NAME: [name] DOB: 1956/01/17 MRN: [id number]  PRIMARY CARE PROVIDER: [name], [name], MD  RESPONSIBLE PARTY:  Acct ID - Guarantor Home Phone Work Phone Relationship Acct Type  [id number] JAMARA, [phone]  Self P/F     [address] [place], [place], [place] [address]   Due to the COVID-19 crisis, this virtual check-in visit was done via telephone from my office and it was initiated and consent by this patient and or family.  PLAN OF CARE and INTERVENTION:  ADVANCE CARE PLANNING/GOALS OF CARE:Goal is for patient to remain at home with her husband. PATIENT/CAREGIVER EDUCATION: Symptom management DISEASE STATUS: Joint telephonic follow-up palliative care visit completed with LCSW, M. Lonon. Visit was originally scheduled in person, however patient states that she is not feeling well and says it is best if we did not come today. She feels like she may be running a fever along with cough and congestion. She continues with some dyspnea on exertion and wears O2 at 3.5L/min via [place] but is at baseline. She denies any pain or discomfort, but does require frequent rest periods. She continues with a poor appetite, but does try to eat small meals. She states that she is taking her medications as prescribed. She states that if her symptoms do not improve that she will contact her doctor. Will continue to monitor.   HISTORY OF PRESENT ILLNESS:  This is a 65 yo female with a history of CHF, COPD, HTN, cerebrovascular disease, hyperlipidemia, GERD and hypothyroidism. Palliative care team continues to follow patient for additional support, goals of care and complex decision making.  CODE STATUS: Full code ADVANCED DIRECTIVES: Y MOST FORM: no PPS: 60%   (Duration of visit and documentation 15 minutes)   Daryl Eastern, RN BSN

## 2021-02-07 NOTE — Chronic Care Management (AMB) (Signed)
°  Care Management   Note  02/07/2021 Name: [name] MRN: [id number] DOB: 1955-06-06  [name] is a 66 y.o. year old female who is a primary care patient of [name], [name], MD and is actively engaged with the care management team. I reached out to [name] by phone today to assist with re-scheduling a follow up visit with the Licensed Clinical Social Worker  Follow up plan: A second unsuccessful telephone outreach attempt made. A HIPAA compliant phone message was left for the patient providing contact information and requesting a return call. The care management team will reach out to the patient again over the next 7 days.  If patient returns call to provider office, please advise to call [place] at [phone].  [place] Management  Direct Dial: [phone]

## 2021-02-14 NOTE — Chronic Care Management (AMB) (Signed)
°  Care Management   Note  02/14/2021 Name: [name] MRN: [id number] DOB: April 02, 1955  [name] is a 66 y.o. year old female who is a primary care patient of [name], [name], MD and is actively engaged with the care management team. I reached out to [name] by phone today to assist with re-scheduling a follow up visit with the Licensed Clinical Social Worker  Follow up plan: A third unsuccessful telephone outreach attempt made. A HIPAA compliant phone message was left for the patient providing contact information and requesting a return call. Unable to make contact on outreach attempts x 3. PCP [name], [name], MD notified via routed documentation in medical record. The care management team is available to follow up with the patient after provider conversation with the patient regarding recommendation for care management engagement and subsequent re-referral to the care management team. If patient returns call to provider office, please advise to call [place] at [phone].  [place] Management  Direct Dial: [phone]

## 2021-02-14 NOTE — Chronic Care Management (AMB) (Signed)
°  Care Management   Note  02/14/2021 Name: Carle Dargan MRN: [id number] DOB: Feb 19, 1955  Amadi Yoshino Otte is a 66 y.o. year old female who is a primary care patient of [name], [name], MD and is actively engaged with the care management team. I reached out to [name] by phone today to assist with re-scheduling a follow up visit with the Licensed Clinical Social Worker  Follow up plan: Telephone appointment with care management team member scheduled for:03/06/21  [place] [facility]  Direct Dial: [phone]

## 2021-02-16 ENCOUNTER — Ambulatory Visit: Payer: Medicare Other | Admitting: Physical Medicine and Rehabilitation

## 2021-02-19 ENCOUNTER — Telehealth: Payer: Self-pay | Admitting: *Deleted

## 2021-02-19 NOTE — Chronic Care Management (AMB) (Signed)
°  Care Management   Note  02/19/2021 Name: [name] MRN: [id number] DOB: Feb 11, 1955  [name] is a 66 y.o. year old female who is a primary care patient of [name], [name], MD and is actively engaged with the care management team. I reached out to [name] by phone today to assist with re-scheduling a follow up visit with the Licensed Clinical Social Worker  Follow up plan: Unsuccessful telephone outreach attempt made. A HIPAA compliant phone message was left for the patient providing contact information and requesting a return call.  The care management team will reach out to the patient again over the next 7 days.  If patient returns call to provider office, please advise to call [place] at [phone].  [place] Management  Direct Dial: [phone]

## 2021-02-21 ENCOUNTER — Encounter: Payer: Self-pay | Admitting: Student

## 2021-02-21 ENCOUNTER — Other Ambulatory Visit: Payer: Self-pay

## 2021-02-21 ENCOUNTER — Telehealth: Payer: Medicare Other

## 2021-02-21 ENCOUNTER — Ambulatory Visit: Payer: Medicare Other | Admitting: Student

## 2021-02-21 VITALS — BP 96/64 | HR 88 | Ht 62.0 in | Wt 115.2 lb

## 2021-02-21 DIAGNOSIS — I951 Orthostatic hypotension: Secondary | ICD-10-CM

## 2021-02-21 DIAGNOSIS — I5022 Chronic systolic (congestive) heart failure: Secondary | ICD-10-CM

## 2021-02-21 DIAGNOSIS — E785 Hyperlipidemia, unspecified: Secondary | ICD-10-CM

## 2021-02-21 DIAGNOSIS — I447 Left bundle-branch block, unspecified: Secondary | ICD-10-CM | POA: Diagnosis not present

## 2021-02-21 DIAGNOSIS — Z79899 Other long term (current) drug therapy: Secondary | ICD-10-CM

## 2021-02-21 DIAGNOSIS — J449 Chronic obstructive pulmonary disease, unspecified: Secondary | ICD-10-CM

## 2021-02-21 MED ORDER — SPIRONOLACTONE 25 MG PO TABS: 12.5000 mg | ORAL_TABLET | Freq: Every day | ORAL | 3 refills | Status: AC

## 2021-02-21 MED ORDER — TORSEMIDE 20 MG PO TABS: 20.0000 mg | ORAL_TABLET | Freq: Every day | ORAL | 3 refills | Status: AC | PRN

## 2021-02-21 NOTE — Progress Notes (Signed)
Cardiology Office Note    Date:  02/21/2021   ID:  [name], [name] 1955/06/29, MRN [id number]  PCP:  [name], Beverly Halsted, MD  Cardiologist: Beverly Lesches, MD    Chief Complaint  Patient presents with   Follow-up    S/p cardiac catheterization    History of Present Illness:    [name] is a 66 y.o. female with past medical history of HFrEF (EF 30-35% in 07/2020 and similar by repeat imaging in 11/2020), normal cors by cath in 2015, chronic LBBB, COPD, HLD, hypothyroidism, anxiety, chronic pain syndrome, left subclavian stenosis and tobacco use who presents to the office today for follow-up from her recent cardiac catheterization.  She was last examined by myself in 01/2021 for follow-up from her recent stress test which had shown numerous fixed defects and Dr. Domenic Smith had recommended cardiac catheterization for definitive evaluation in the setting of this along with her known cardiomyopathy. She was continued on Torsemide 20 mg daily along with Jardiance 10 mg daily as she was not able to tolerate beta-blocker therapy given her baseline heart rate in the 50's and she has not started on ARB or Spironolactone given her low BP.  Her cardiac catheterization was performed by Dr. Irish Smith on 01/30/2021 and showed only 25% proximal LCx stenosis and LVEDP was normal. She did have a mean pulmonary capillary wedge of 11 mmHg and cardiac output of 2.9 L/min. She did have complete heart block with right heart catheterization but returned to normal sinus rhythm within an hour after the procedure.  In talking with the patient today, she reports still having baseline dyspnea on exertion in the setting of chronic hypoxic respiratory failure and remains on supplemental oxygen 24/7. She is trying to get approved for a portable oxygen concentrator as she is unable to travel far due to the use of oxygen tanks. She denies any recent chest pain or palpitations. No specific orthopnea, PND  or pitting edema. No complications regarding her recent catheterization. She does report very frequent urination with Torsemide and feels weaker after taking this.   Past Medical History:  Diagnosis Date   Anemia    Anxiety and depression    Bronchitis    CHF (congestive heart failure) ([place])    a. EF 30-35% in 07/2020 and similar by repeat imaging in 11/2020   Chronic pain    COPD (chronic obstructive pulmonary disease) (HCC)    Gastroesophageal reflux disease    History of cardiac catheterization    a. Normal coronary arteries 2015 b. cath in 01/2021 showing only 25% proximal LCx stenosis   Hyperlipidemia    Hypertension    Hypothyroid    Migraine headache    Near syncope    On home oxygen therapy    Started 08/2018   Peripheral vascular disease ([place])    Pneumonia    Secondary cardiomyopathy ([place])    Tobacco abuse    1/2 pack per day   Uterine cancer [facility])     Past Surgical History:  Procedure Laterality Date   ABDOMINAL HYSTERECTOMY     without oophorectomy for neoplastic disease   BACK SURGERY     BIOPSY  10/22/2018   Procedure: BIOPSY;  Surgeon: Beverly Binder, MD;  Location: [facility];  Service: Endoscopy;;  duodenal, gastric   CARDIAC CATHETERIZATION  07/2013   normal coronary arteries   CARPAL TUNNEL RELEASE     Left   CHOLECYSTECTOMY  2007   DILATION AND CURETTAGE OF  UTERUS     ESOPHAGOGASTRODUODENOSCOPY (EGD) WITH PROPOFOL N/A 10/22/2018   gastritis (mild reactive gastropathy, negative duodenal biopsies for celiac).    LAPAROTOMY     LEFT HEART CATHETERIZATION WITH CORONARY ANGIOGRAM N/A 07/12/2013   Procedure: LEFT HEART CATHETERIZATION WITH CORONARY ANGIOGRAM;  Surgeon: Beverly Man, MD;  Location: [facility] CATH LAB;  Service: Cardiovascular;  Laterality: N/A;   LUMBAR LAMINECTOMY  x3   Left; complicated by neurologic dysfunction   NASAL SINUS SURGERY     RIGHT/LEFT HEART CATH AND CORONARY ANGIOGRAPHY N/A 01/30/2021   Procedure: RIGHT/LEFT HEART CATH AND  CORONARY ANGIOGRAPHY;  Surgeon: Beverly Booze, MD;  Location: [facility] CV LAB;  Service: Cardiovascular;  Laterality: N/A;    Current Medications: Outpatient Medications Prior to Visit  Medication Sig Dispense Refill   albuterol (PROAIR HFA) 108 (90 Base) MCG/ACT inhaler Inhale 2 puffs into the lungs every 4 (four) hours as needed for wheezing or shortness of breath (cough). 18 g 11   ALPRAZolam (XANAX) 0.5 MG tablet TAKE 1 TABLET BY MOUTH TWICE DAILY AS NEEDED FOR ANXIETY. 60 tablet 5   aspirin EC 81 MG tablet Take 81 mg by mouth daily.     Budeson-Glycopyrrol-Formoterol (BREZTRI AEROSPHERE) 160-9-4.8 MCG/ACT AERO Inhale 2 puffs into the lungs in the morning and at bedtime. 10.7 g 0   DULoxetine (CYMBALTA) 60 MG capsule TAKE ONE CAPSULE BY MOUTH 2 TIMES A DAY. 60 capsule 5   empagliflozin (JARDIANCE) 10 MG TABS tablet Take 1 tablet (10 mg total) by mouth daily. 90 tablet 3   ezetimibe (ZETIA) 10 MG tablet Take 10 mg by mouth daily.     levothyroxine (SYNTHROID) 50 MCG tablet TAKE 1 TABLET IN THE MORNING BEFORE BREAKFAST. 30 tablet 11   midodrine (PROAMATINE) 5 MG tablet TAKE ONE TABLET BY MOUTH 3 TIMES A DAY WITH MEALS 90 tablet 2   mirtazapine (REMERON) 15 MG tablet TAKE (1) TABLET BY MOUTH AT BEDTIME. 30 tablet 5   oxyCODONE (OXY IR/ROXICODONE) 5 MG immediate release tablet TAKE (1) TABLET BY MOUTH EVERY SIX HOURS AS NEEDED FOR SEVERE PAIN. 120 tablet 0   pantoprazole (PROTONIX) 40 MG tablet TAKE ONE TABLET BY MOUTH TWICE DAILY BEFORE A MEAL. 180 tablet 0   predniSONE (DELTASONE) 10 MG tablet Take  4 each am x 2 days,   2 each am x 2 days,  1 each am x 2 days and stop 14 tablet 0   pregabalin (LYRICA) 50 MG capsule TAKE 1 CAPSULE BY MOUTH IN THEMORNING AND 2 CAPSULES AT BEDTIME. 90 capsule 5   QUEtiapine (SEROQUEL) 50 MG tablet TAKE 1 OR 2 TABLETS BY MOUTH AT BEDTIME (Patient taking differently: Take 50 mg by mouth 3 (three) times daily.) 60 tablet 5   torsemide (DEMADEX) 20 MG  tablet Take 1 tablet (20 mg total) by mouth daily. 90 tablet 1   nicotine (NICODERM CQ - DOSED IN MG/24 HR) 7 mg/24hr patch Place 1 patch (7 mg total) onto the skin daily. (Patient not taking: Reported on 02/21/2021) 28 patch 2   No facility-administered medications prior to visit.     Allergies:   Mirtazapine, Acetaminophen-codeine, Aspirin, Astelin [azelastine], Bactrim [sulfamethoxazole-trimethoprim], Celecoxib, Fentanyl, Levaquin [levofloxacin in d5w], Lipitor [atorvastatin], Neomycin, Salicylates, Sulfa antibiotics, Adhesive [tape], Ciprofloxacin, Latex, and Tegretol [carbamazepine]   Social History   Socioeconomic History   Marital status: Married    Spouse name: Bryna Razavi   Number of children: 2   Years of education: 12   Highest  education level: 12th grade  Occupational History    Comment: Disabled  Tobacco Use   Smoking status: Every Day    Packs/day: 0.25    Years: 40.00    Pack years: 10.00    Types: Cigarettes    Start date: 09/06/1972    Passive exposure: Current   Smokeless tobacco: Never   Tobacco comments:    Patient has 1-2 cigs a day and trying to quit.   Vaping Use   Vaping Use: Never used  Substance and Sexual Activity   Alcohol use: No    Alcohol/week: 0.0 standard drinks   Drug use: No   Sexual activity: Not Currently    Birth control/protection: Surgical  Other Topics Concern   Not on file  Social History Narrative   Not on file   Social Determinants of Health   Financial Resource Strain: Low Risk    Difficulty of Paying Living Expenses: Not very hard  Food Insecurity: No Food Insecurity   Worried About Running Out of Food in the Last Year: Never true   Ran Out of Food in the Last Year: Never true  Transportation Needs: No Transportation Needs   Smith of Transportation (Medical): No   Smith of Transportation (Non-Medical): No  Physical Activity: Inactive   Days of Exercise per Week: 0 days   Minutes of Exercise per Session: 0 min  Stress:  No Stress Concern Present   Feeling of Stress : Only a little  Social Connections: Engineer, building services of Communication with Friends and Family: More than three times a week   Frequency of Social Gatherings with Friends and Family: More than three times a week   Attends Religious Services: More than 4 times per year   Active Member of [employer] or Organizations: Yes   Attends Archivist Meetings: 1 to 4 times per year   Marital Status: Married     Family History:  The patient's family history includes Bleeding Disorder in her mother; Colon polyps in her mother; Deep vein thrombosis in her mother; Heart disease in her brother and mother; Hypertension in her brother, mother, and sister.   Review of Systems:    Please see the history of present illness.     All other systems reviewed and are otherwise negative except as noted above.   Physical Exam:    VS:  BP 96/64    Pulse 88    Ht 5\' 2"  (1.575 m)    Wt 115 lb 3.2 oz (52.3 kg)    SpO2 93%    BMI 21.07 kg/m    General: Well developed, well nourished,female appearing in no acute distress. Head: Normocephalic, atraumatic. Neck: No carotid bruits. JVD not elevated.  Lungs: Respirations regular and unlabored, without wheezes or rales.  Heart: Regular rate and rhythm. No S3 or S4.  No murmur, no rubs, or gallops appreciated. Abdomen: Appears non-distended. No obvious abdominal masses. Msk:  Strength and tone appear normal for age. No obvious joint deformities or effusions. Extremities: No clubbing or cyanosis. No pitting edema.  Distal pedal pulses are 2+ bilaterally. Radial cath site without ecchymosis or evidence of a hematoma.  Neuro: Alert and oriented X 3. Moves all extremities spontaneously. No focal deficits noted. Psych:  Responds to questions appropriately with a normal affect. Skin: No rashes or lesions noted  Wt Readings from Last 3 Encounters:  02/21/21 115 lb 3.2 oz (52.3 kg)  01/30/21 120 lb (54.4 kg)   01/17/21 116 lb 1.9  oz (52.7 kg)     Studies/Labs Reviewed:   EKG:  EKG is ordered today.  The ekg ordered today demonstrates NSR, HR 86 with known LBBB.   Recent Labs: 07/26/2020: B Natriuretic Peptide 3,606.8 08/22/2020: ALT 9 11/17/2020: TSH 1.240 01/01/2021: Magnesium 2.0 01/18/2021: BUN 19; Creatinine, Ser 1.41; Platelets 284 01/30/2021: Hemoglobin 12.6; Hemoglobin 12.6; Potassium 4.0; Potassium 4.0; Sodium 140; Sodium 139   Lipid Panel    Component Value Date/Time   CHOL 129 07/28/2020 0125   TRIG 108 07/28/2020 0125   HDL 28 (L) 07/28/2020 0125   CHOLHDL 4.6 07/28/2020 0125   VLDL 22 07/28/2020 0125   LDLCALC 79 07/28/2020 0125    Additional studies/ records that were reviewed today include:   Echocardiogram: 11/2020 IMPRESSIONS     1. Left ventricular ejection fraction, by estimation, is 30 to 35%. The  left ventricle has moderately decreased function. The left ventricle  demonstrates global hypokinesis with paradoxical septal motion in the  setting of LBBB. There is mild left  ventricular hypertrophy. Left ventricular diastolic parameters are  indeterminate. Global longitudinal strain measures not reported due to  incomplete tracking of myocardial segments.   2. Right ventricular systolic function is normal. The right ventricular  size is normal. There is normal pulmonary artery systolic pressure. The  estimated right ventricular systolic pressure is 78.6 mmHg.   3. The mitral valve is grossly normal. Trivial mitral valve  regurgitation.   4. The aortic valve is tricuspid. Aortic valve regurgitation is not  visualized.   5. The inferior vena cava is normal in size with greater than 50%  respiratory variability, suggesting right atrial pressure of 3 mmHg.   Comparison(s): Prior images reviewed side by side. No significant change  in LVEF.   Cardiac Catheterization: 01/2021 Prox Cx lesion is 25% stenosed.   LV end diastolic pressure is normal.   There is  no aortic valve stenosis.   Ao sat 99%, PA sat 61%, PA pressure 32/11, mean PA pressure 17 mmHg, mean pulmonary capillary wedge 11 mmHg, cardiac output 2.9 L/min, cardiac index 1.92.  These numbers were obtained while the patient was in complete heart block after the wedge catheter was advanced.   No significant coronary artery disease.   Complete heart block noted with the right heart catheterization.  Will watch and see if her heart rhythm improves.  If not, she will need to be admitted and her rhythm observed.   An hour after the procedure, she resumed normal sinus rhythm and BP was stable.  She went to short stay at that point.     Assessment:    1. Chronic systolic heart failure ([place])   2. Medication management   3. Orthostatic hypotension   4. LBBB (left bundle branch block)   5. Hyperlipidemia LDL goal <70   6. Chronic obstructive pulmonary disease, unspecified COPD type ([place])      Plan:   In order of problems listed above:  1. HFrEF/NICM - Prior echocardiogram in 07/2020 and 11/2020 showed her EF was at 30-35% and recent cardiac catheterization showed minimal nonobstructive disease. - Her BP is at 96/64 during today's visit and she is currently only on Jardiance 10 mg daily and Torsemide 20 mg daily for her cardiomyopathy. Will try adjusting Torsemide to PRN dosing only and switching to Spironolactone 12.5 mg daily with follow-up BMET in 2 weeks. Can possibly add low-dose Losartan pending BP trend. No BB given baseline HR in the 50's during most visits. Would ultimately  plan for a repeat echo in several months once medical therapy has been optimized. If EF remains reduced, would recommend referral to EP for consideration of CRT given her associated LBBB.   2. Orthostatic Hypotension - She remains on Midodrine 5mg  TID. Will try adjusting Torsemide to just PRN dosing and adding Spironolactone 12.5mg  daily.   3. LBBB - This has been chronic since at least 2015 and  catheterization at that time showed normal coronary arteries and recent cardiac catheterization last month showed only 25% proximal LCx stenosis as outlined above. She may be a candidate for CRT going forward if her EF remains reduced.  4. HLD - LDL was at 79 in 07/2020. She has been intolerant to statins in the past secondary to myalgias and remains on Zetia 10 mg daily.  5. COPD - She is on 3.5L [place] at baseline and is followed by [facility]. Awaiting approval for a portable oxygen concentrator.    Medication Adjustments/Labs and Tests Ordered: Current medicines are reviewed at length with the patient today.  Concerns regarding medicines are outlined above.  Medication changes, Labs and Tests ordered today are listed in the Patient Instructions below. Patient Instructions  Medication Instructions:   Torsemide 20 mg Daily as needed for weight gain or swelling.  Spironolactone 12.5 mg Daily   *If you need a refill on your cardiac medications before your next appointment, please call your pharmacy*   Lab Work: Your physician recommends that you return for lab work in: 2 Weeks   If you have labs (blood work) drawn today and your tests are completely normal, you will receive your results only by: [place] (if you have MyChart) OR A paper copy in the mail If you have any lab test that is abnormal or we need to change your treatment, we will call you to review the results.   Testing/Procedures: NONE    Follow-Up: At [facility], you and your health needs are our priority.  As part of our continuing mission to provide you with exceptional heart care, we have created designated Provider Care Teams.  These Care Teams include your primary Cardiologist (physician) and Advanced Practice Providers (APPs -  Physician Assistants and Nurse Practitioners) who all work together to provide you with the care you need, when you need it.  We recommend signing up for the patient portal  called "MyChart".  Sign up information is provided on this After Visit Summary.  MyChart is used to connect with patients for Virtual Visits (Telemedicine).  Patients are able to view lab/test results, encounter notes, upcoming appointments, etc.  Non-urgent messages can be sent to your provider as well.   To learn more about what you can do with MyChart, go to [email or web address].    Your next appointment:   2 month(s)  The format for your next appointment:   In Person  Provider:   Rozann Lesches, MD    Other Instructions Thank you for choosing [facility]!      Signed, Erma Heritage, PA-C  02/21/2021 5:00 PM    [place]. [address] [place],  [address] Phone: [phone] Fax: [phone]

## 2021-02-21 NOTE — Patient Instructions (Signed)
Medication Instructions:   Torsemide 20 mg Daily as needed for weight gain or swelling.  Spironolactone 12.5 mg Daily   *If you need a refill on your cardiac medications before your next appointment, please call your pharmacy*   Lab Work: Your physician recommends that you return for lab work in: 2 Weeks   If you have labs (blood work) drawn today and your tests are completely normal, you will receive your results only by: [place] (if you have MyChart) OR A paper copy in the mail If you have any lab test that is abnormal or we need to change your treatment, we will call you to review the results.   Testing/Procedures: NONE    Follow-Up: At [facility], you and your health needs are our priority.  As part of our continuing mission to provide you with exceptional heart care, we have created designated Provider Care Teams.  These Care Teams include your primary Cardiologist (physician) and Advanced Practice Providers (APPs -  Physician Assistants and Nurse Practitioners) who all work together to provide you with the care you need, when you need it.  We recommend signing up for the patient portal called "MyChart".  Sign up information is provided on this After Visit Summary.  MyChart is used to connect with patients for Virtual Visits (Telemedicine).  Patients are able to view lab/test results, encounter notes, upcoming appointments, etc.  Non-urgent messages can be sent to your provider as well.   To learn more about what you can do with MyChart, go to [email or web address].    Your next appointment:   2 month(s)  The format for your next appointment:   In Person  Provider:   Rozann Lesches, MD    Other Instructions Thank you for choosing [facility]!

## 2021-02-22 ENCOUNTER — Telehealth: Payer: Medicare Other

## 2021-02-26 NOTE — Chronic Care Management (AMB) (Signed)
°  Care Management   Note  02/26/2021 Name: [name] MRN: [id number] DOB: 1955/10/22  [name] is a 66 y.o. year old female who is a primary care patient of [name], [name], MD and is actively engaged with the care management team. I reached out to [name] by phone today to assist with re-scheduling a follow up visit with the RN Case Manager and Licensed Clinical Social Worker  Follow up plan: Unsuccessful telephone outreach attempt made. A HIPAA compliant phone message was left for the patient providing contact information and requesting a return call.  The care management team will reach out to the patient again over the next 7 days.  If patient returns call to provider office, please advise to call [place]  at [phone].  [facility]  Direct Dial: [phone]

## 2021-03-05 ENCOUNTER — Encounter
Payer: Medicare Other | Attending: Physical Medicine and Rehabilitation | Admitting: Physical Medicine and Rehabilitation

## 2021-03-05 ENCOUNTER — Other Ambulatory Visit: Payer: Self-pay

## 2021-03-05 ENCOUNTER — Encounter: Payer: Self-pay | Admitting: Physical Medicine and Rehabilitation

## 2021-03-05 VITALS — BP 102/68 | HR 88 | Ht 62.0 in | Wt 118.0 lb

## 2021-03-05 DIAGNOSIS — F4323 Adjustment disorder with mixed anxiety and depressed mood: Secondary | ICD-10-CM | POA: Diagnosis not present

## 2021-03-05 DIAGNOSIS — I950 Idiopathic hypotension: Secondary | ICD-10-CM | POA: Diagnosis not present

## 2021-03-05 DIAGNOSIS — M5116 Intervertebral disc disorders with radiculopathy, lumbar region: Secondary | ICD-10-CM | POA: Insufficient documentation

## 2021-03-05 MED ORDER — MIDODRINE HCL 5 MG PO TABS: ORAL_TABLET | ORAL | 5 refills | Status: AC

## 2021-03-05 MED ORDER — PREGABALIN 100 MG PO CAPS: 100.0000 mg | ORAL_CAPSULE | Freq: Three times a day (TID) | ORAL | 5 refills | Status: AC

## 2021-03-05 MED ORDER — QUETIAPINE FUMARATE 50 MG PO TABS: 50.0000 mg | ORAL_TABLET | Freq: Every day | ORAL | 5 refills | Status: AC

## 2021-03-05 MED ORDER — MIRTAZAPINE 15 MG PO TABS: ORAL_TABLET | ORAL | 5 refills | Status: AC

## 2021-03-05 MED ORDER — OXYCODONE HCL 5 MG PO TABS: ORAL_TABLET | ORAL | 0 refills | Status: DC

## 2021-03-05 MED ORDER — ALPRAZOLAM 0.5 MG PO TABS: 0.5000 mg | ORAL_TABLET | Freq: Three times a day (TID) | ORAL | 5 refills | Status: DC | PRN

## 2021-03-05 MED ORDER — DULOXETINE HCL 60 MG PO CPEP: 60.0000 mg | ORAL_CAPSULE | Freq: Two times a day (BID) | ORAL | 5 refills | Status: AC

## 2021-03-05 NOTE — Progress Notes (Signed)
Subjective:    Patient ID: [name], female    DOB: 1956/02/02, 66 y.o.   MRN: [id number]  HPI   Pt is a 66 yr old female with Sciatica and chronic low back pain/lumbar radiculopathy s/p 3 lumbar surgeries in 1990s. Also has severe anxiety- was on Xanax 0.5 mg prior- and was on O2 full time and insomnia and hypothyroidism with increased Synthroid. Has newly Dx'd CHF  still smoking, but not as much-   Here for f/u on chronic pain.   Sciatic nerve pain kicking up last 48 hours.  Feels like up for 2 days.  Walking around on deck frequently overnight.   Doesn't know what set it off.  Hasn't fallen or anything that would set it off.  Doesn't occur often, but does every now and then.  Forgot her med bottles again today.   Last TSH 1.24- doing OK.   To try and get anxiety meds taken care of-  Has still worked on that- but doesn't have appt right now.  To get a oxygen condenser- meets them next month, so doesn't have to drag tanks around.   Will be get out of house more- and should hopefully help depression.   Smoking 4 cigarettes/day. Hard to get off these last ones- when wakes up and after eating.  Doesn't do as well when hurting.  Still taking Seroquel to sleep- usually takes 1- but occ 2 tabs.          Pain Inventory Average Pain 6 Pain Right Now 8 My pain is sharp, burning, and stabbing  In the last 24 hours, has pain interfered with the following? General activity 7 Relation with others 7 Enjoyment of life 7 What TIME of day is your pain at its worst? morning  Sleep (in general) Poor  Pain is worse with: bending, sitting, standing, and some activites Pain improves with: rest, medication, and TENS Relief from Meds: 8  Family History  Problem Relation Age of Onset   Colon polyps Mother    Deep vein thrombosis Mother    Heart disease Mother    Hypertension Mother    Bleeding Disorder Mother    Hypertension Sister    Heart disease Brother         before age 93   Hypertension Brother    Social History   Socioeconomic History   Marital status: Married    Spouse name: Enez Monahan   Number of children: 2   Years of education: 12   Highest education level: 12th grade  Occupational History    Comment: Disabled  Tobacco Use   Smoking status: Every Day    Packs/day: 0.25    Years: 40.00    Pack years: 10.00    Types: Cigarettes    Start date: 09/06/1972    Passive exposure: Current   Smokeless tobacco: Never   Tobacco comments:    Patient has 1-2 cigs a day and trying to quit.   Vaping Use   Vaping Use: Never used  Substance and Sexual Activity   Alcohol use: No    Alcohol/week: 0.0 standard drinks   Drug use: No   Sexual activity: Not Currently    Birth control/protection: Surgical  Other Topics Concern   Not on file  Social History Narrative   Not on file   Social Determinants of Health   Financial Resource Strain: Low Risk    Difficulty of Paying Living Expenses: Not very hard  Food Insecurity: No Food  Insecurity   Worried About Charity fundraiser in the Last Year: Never true   [place] in the Last Year: Never true  Transportation Needs: No Transportation Needs   Lack of Transportation (Medical): No   Lack of Transportation (Non-Medical): No  Physical Activity: Inactive   Days of Exercise per Week: 0 days   Minutes of Exercise per Session: 0 min  Stress: No Stress Concern Present   Feeling of Stress : Only a little  Social Connections: Engineer, building services of Communication with Friends and Family: More than three times a week   Frequency of Social Gatherings with Friends and Family: More than three times a week   Attends Religious Services: More than 4 times per year   Active Member of Clubs or Organizations: Yes   Attends Archivist Meetings: 1 to 4 times per year   Marital Status: Married   Past Surgical History:  Procedure Laterality Date   ABDOMINAL HYSTERECTOMY      without oophorectomy for neoplastic disease   BACK SURGERY     BIOPSY  10/22/2018   Procedure: BIOPSY;  Surgeon: Danie Binder, MD;  [facility];  Service: Endoscopy;;  duodenal, gastric   CARDIAC CATHETERIZATION  07/2013   normal coronary arteries   CARPAL TUNNEL RELEASE     Left   CHOLECYSTECTOMY  2007   DILATION AND CURETTAGE OF UTERUS     ESOPHAGOGASTRODUODENOSCOPY (EGD) WITH PROPOFOL N/A 10/22/2018   gastritis (mild reactive gastropathy, negative duodenal biopsies for celiac).    LAPAROTOMY     LEFT HEART CATHETERIZATION WITH CORONARY ANGIOGRAM N/A 07/12/2013   Procedure: LEFT HEART CATHETERIZATION WITH CORONARY ANGIOGRAM;  Surgeon: Leonie Man, MD;  Location: [facility] CATH LAB;  Service: Cardiovascular;  Laterality: N/A;   LUMBAR LAMINECTOMY  x3   Left; complicated by neurologic dysfunction   NASAL SINUS SURGERY     RIGHT/LEFT HEART CATH AND CORONARY ANGIOGRAPHY N/A 01/30/2021   Procedure: RIGHT/LEFT HEART CATH AND CORONARY ANGIOGRAPHY;  Surgeon: Jettie Booze, MD;  Location: [place] CV LAB;  Service: Cardiovascular;  Laterality: N/A;   Past Surgical History:  Procedure Laterality Date   ABDOMINAL HYSTERECTOMY     without oophorectomy for neoplastic disease   BACK SURGERY     BIOPSY  10/22/2018   Procedure: BIOPSY;  Surgeon: Danie Binder, MD;  [facility]: [facility];  Service: Endoscopy;;  duodenal, gastric   CARDIAC CATHETERIZATION  07/2013   normal coronary arteries   CARPAL TUNNEL RELEASE     Left   CHOLECYSTECTOMY  2007   DILATION AND CURETTAGE OF UTERUS     ESOPHAGOGASTRODUODENOSCOPY (EGD) WITH PROPOFOL N/A 10/22/2018   gastritis (mild reactive gastropathy, negative duodenal biopsies for celiac).    LAPAROTOMY     LEFT HEART CATHETERIZATION WITH CORONARY ANGIOGRAM N/A 07/12/2013   Procedure: LEFT HEART CATHETERIZATION WITH CORONARY ANGIOGRAM;  Surgeon: Leonie Man, MD;  Location: [facility] CATH LAB;  Service: Cardiovascular;  Laterality: N/A;   LUMBAR  LAMINECTOMY  x3   Left; complicated by neurologic dysfunction   NASAL SINUS SURGERY     RIGHT/LEFT HEART CATH AND CORONARY ANGIOGRAPHY N/A 01/30/2021   Procedure: RIGHT/LEFT HEART CATH AND CORONARY ANGIOGRAPHY;  Surgeon: Jettie Booze, MD;  Location: [place] [facility] LAB;  Service: Cardiovascular;  Laterality: N/A;   Past Medical History:  Diagnosis Date   Anemia    Anxiety and depression    Bronchitis    CHF (congestive  heart failure) ([place])    a. EF 30-35% in 07/2020 and similar by repeat imaging in 11/2020   Chronic pain    COPD (chronic obstructive pulmonary disease) (HCC)    Gastroesophageal reflux disease    History of cardiac catheterization    a. Normal coronary arteries 2015 b. cath in 01/2021 showing only 25% proximal LCx stenosis   Hyperlipidemia    Hypertension    Hypothyroid    Migraine headache    Near syncope    On home oxygen therapy    Started 08/2018   Peripheral vascular disease ([place])    Pneumonia    Secondary cardiomyopathy ([place])    Tobacco abuse    1/2 pack per day   Uterine cancer ([place])    BP 102/68    Pulse 88    Ht 5\' 2"  (1.575 m)    Wt 118 lb (53.5 kg)    SpO2 94%    BMI 21.58 kg/m   Opioid Risk Score:   Fall Risk Score:  `1  Depression screen PHQ 2/9  Depression screen [facility] 2/9 03/05/2021 12/05/2020 09/26/2020 07/26/2020 04/07/2020 09/17/2018  Decreased Interest 1 0 0 1 1 0  Down, Depressed, Hopeless 1 1 1 1 1  0  PHQ - 2 Score 2 1 1 2 2  0  Altered sleeping - - - - - 0  Tired, decreased energy - - - - - 0  Change in appetite - - - - - 1  Feeling bad or failure about yourself  - - - - - 0  Trouble concentrating - - - - - 0  Moving slowly or fidgety/restless - - - - - 0  Suicidal thoughts - - - - - 0  PHQ-9 Score - - - - - 1  Difficult doing work/chores - - - - - Not difficult at all  Some recent data might be hidden     Review of Systems  Musculoskeletal:  Positive for back pain.       Pain down back of left leg  All other systems  reviewed and are negative.     Objective:   Physical Exam Awake, alert, appropriate, on O2 by tank- by [place], NAD- on 3L Anxious, but stable- walking around room some Almost cachetic Low back TTP across low back in band Lipoma palpated over teres minor/major- on R side- not TTP and no erythema/major swelling. Is squishy        Assessment & Plan:    Pt is a 66 yr old female with Sciatica and chronic low back pain/lumbar radiculopathy s/p 3 lumbar surgeries in 1990s. Also has severe anxiety- was on Xanax 0.5 mg prior- and was on O2 full time and insomnia and hypothyroidism with increased Synthroid. Has newly Dx'd CHF  still smoking, but not as much-  Off prednisone for now.   Will try to increase Lyrica to 100 mg in 200 mg at night- for nerve pain- to help sciatic pain.   2. Con't Seroquel for sleep- sent in refills-   3. Con't Midodrine for low BP- 5 mg 3x/day with meals- #90- 5 refills.   4. Will refill Oxycodone 5 mg 4x/day as needed for pain- #120- no refills   5. Will increase Xanax to 0.5 mg to 3x/day as needed for anxiety - pt still trying to find someone to take over anxiety meds. Need to determine this in next 6 months.    6. Will con't Remeron 15 mg nightly for sleep/mood/appetite- #30- 5 refills  7 . Con't Synthroid- last level 1/24- so will con't dose at current levels.   8. Think pt has Lipoma over R lateral scapula/teres muscles- feels squishy and nonadherent- but would verify by discussing with PCP.    9. F/U in 3 months- on chronic pain.   10. Needs to stop smoking- to save her life. Went over smoking cessation- explained don't take care of 66 yr old smokers.

## 2021-03-05 NOTE — Patient Instructions (Signed)
Pt is a 66 yr old female with Sciatica and chronic low back pain/lumbar radiculopathy s/p 3 lumbar surgeries in 1990s. Also has severe anxiety- was on Xanax 0.5 mg prior- and was on O2 full time and insomnia and hypothyroidism with increased Synthroid. Has newly Dx'd CHF  still smoking, but not as much-  Off prednisone for now.   Will try to increase Lyrica to 100 mg in 200 mg at night- for nerve pain- to help sciatic pain.   2. Con't Seroquel for sleep- sent in refills-   3. Con't Midodrine for low BP- 5 mg 3x/day with meals- #90- 5 refills.   4. Will refill Oxycodone 5 mg 4x/day as needed for pain- #120- no refills   5. Will increase Xanax to 0.5 mg to 3x/day as needed for anxiety - pt still trying to find someone to take over anxiety meds. Need to determine this in next 6 months.    6. Will con't Remeron 15 mg nightly for sleep/mood/appetite- #30- 5 refills   7 . Con't Synthroid- last level 1/24- so will con't dose at current levels.   8. Think pt has Lipoma over R lateral scapula/teres muscles- feels squishy and nonadherent- but would verify by discussing with PCP.    9. F/U in 3 months- on chronic pain.   10. Needs to STOP smoking- to save her life.

## 2021-03-06 ENCOUNTER — Telehealth: Payer: Medicare Other

## 2021-03-07 ENCOUNTER — Ambulatory Visit: Payer: Medicare Other | Admitting: Internal Medicine

## 2021-03-07 NOTE — Progress Notes (Deleted)
[name], female    DOB: 07-04-1955      MRN: [id number]   Brief patient profile:  13 yowf    MM/active smoker with onset somewhat limited by both breathing and back/ legs x 2017  With GOLD II criteria by spirometry  07/17/15   rx with proair avg sev times a week at most no maint rx then acutely ill for a few days weakness > sob  before admitted [place]  With new acute resp failure/ severe hypoxemia.     History of Present Illness  12/08/2018  Pulmonary/ 1st office eval/[name]  GOLD II/ still smoking on symb 30 'now and then"  And 02 24/7  Chief Complaint  Patient presents with   Pulmonary Consult    Referred by Dr Deniece Ree. Pt c/o SOB since July 2020. She states she was hospitalized with CHF then and started on o2 3lpm continuous o2.   Dyspnea:  Better  But can't walk the dog yet, better on 02 3lpm cont Cough: none  Sleep: no resp symptoms  flat  SABA use: less than usual / nebulizer twice weekly at most  0 2 3lpm 24/7 does not titrate though has pulse ox rec Plan A = Automatic = Always=   Symbicort 160 Take 2 puffs first thing in am and then another 2 puffs about 12 hours later.   Work on inhaler technique:     Plan B = Backup (to supplement plan A, not to replace it) Only use your albuterol inhaler as a rescue medication to be used if you can't catch your breath    Plan C = Crisis (instead of Plan B but only if Plan B stops working) - only use your albuterol nebulizer if you first try Plan B and it fails to help > ok to use the nebulizer up to every 4 hours but if start needing it regularly call for immediate appointment  Goal is to keep your 02 saturation above 90%   The key is to stop smoking completely before smoking completely stops you!   07/21/2019  f/u ov/[name] re: GOLD II/ still smoking but cutting down/ unable to use hfa correctly  / had vaccines for COVID 27  Chief Complaint  Patient presents with   Follow-up    Breathing has been "off and on decent" c/o feeling light  headed.    Dyspnea:  Walking dog around the outside of the house Cough: "it's from my drainage from allergies better with nasal spray/ mucoid  Sleeping: flat bed 2 pillows  SABA use:  Used saba am of ov neb but not the symbicort yet  02: 3lpm continuous rec Plan A = Automatic = Always=    Trelegy one click first thing each am - take two good deep drags and hold for at least 5 5 seconds then out thru the nose Plan B = Backup (to supplement plan A, not to replace it) Only use your albuterol PROAIR/red inhaler as a rescue medication  Plan C = Crisis (instead of Plan B but only if Plan B stops working) - only use your albuterol nebulizer if you first try Plan B and it fails to help > ok to use the nebulizer up to every 4 hours but if start needing it regularly call for immediate appointment Will ask adapt to do a best fit evaluation for your portable system. Make sure you check your oxygen saturations at highest level of activity to be sure it stays over 90%.  Please schedule a follow up office visit in 6 weeks, call sooner if needed - bring all your inhalers and nebulizer solutions.   09/01/2019  f/u ov/[name] re:  GOLD III copd / trelegy maint  Chief Complaint  Patient presents with   Follow-up    shortness of breath with exertion  Dyspnea:  Struggles to walk dog 100 ft on 3lpm pulsed but not checking sats  Cough: no  Sleeping: bed is flat 1-2 pillows  SABA use: much less now  02: 3 hs and up to 3 lpm walking  rec We will see if we can qualify you for POC thru [place]  Make sure you check your oxygen saturations at highest level of activity to be sure it stays over 90%  The key is to stop smoking completely before smoking completely stops you!   12/06/2019  f/u ov/[name] re:  trelegy  GOLD III copd/ 02 dep  Chief Complaint  Patient presents with   Follow-up    nasal congestion   Dyspnea:  750 feet to get mail  Cough: minimal am mucoid Sleeping one pillow bed flat  SABA  use: much less  02: 2lpm sitting  Usually 90s/ 3lpm at hs / not walking on 02 though  Rec The key is to stop smoking completely before smoking completely stops you! Make sure you check your oxygen saturations at highest level of activity     09/12/2020  f/u ov/[place] office/[name] re: GOLD III copd/ 02 dep  maint on breztri but not consistent  with rx  Chief Complaint  Patient presents with   Follow-up    Breathing is overall doing well. She states she is using her proair inhaler about 2 x per wk.   Dyspnea:  has to stop several times to mb and back some hills  x 750 ft  Cough: none  Sleeping: bed is flat 2pillows  SABA use: 2 x weekly  02:  4lpm / no am ha  Covid status: vax x 4  Lung cancer screening: n/a   Rec Plan A = Automatic = Always=    Breztri Take 2 puffs first thing in am and then another 2 puffs about 12 hours later.   Work on inhaler technique:   Plan B = Backup (to supplement plan A, not to replace it) Only use your albuterol inhaler(PROAIR)  as a rescue medication  Make sure you check your oxygen saturation  at your highest level of activity  to be sure it stays over 90%     01/17/2021  f/u ov/[place] office/[name] re: COPD GOLD 3/ 02 dep  maint on Breztri / still smoking  Chief Complaint  Patient presents with   Follow-up    Lobby to room on room air O2 sats were 81%.  On 3LO2 cont. Sats increased to 91% after sitting.   Feels breathing has worsened since last OV. Hoping to get portable oxygen.   Dyspnea:  stopped mb and back 3 months prior to OV  /  pushing cart 2.5 cont stil able to foodlion but not checking sats as rec "afraid of using too much oxygen" Cough: mild cough/congestion but mucus is white  Sleeping: on side/ 2 pillows lots of am congestion brown tinged  SABA use: up to twice daily  02: 3.5 lpm conc at hs / 3.5 with during day at home but not titrating then using less when out of house from refillable tank Covid status: x 4  Rec  03/07/2021  f/u ov/[place] office/[name] re: GOLD 3/ hypoxemica and hypercarbic  maint on ***  No chief complaint on file.   Dyspnea:  *** Cough: *** Sleeping: *** SABA use: *** 02: *** Covid status: *** Lung cancer screening: ***   No obvious day to day or daytime variability or assoc excess/ purulent sputum or mucus plugs or hemoptysis or cp or chest tightness, subjective wheeze or overt sinus or hb symptoms.   *** without nocturnal  or early am exacerbation  of respiratory  c/o's or need for noct saba. Also denies any obvious fluctuation of symptoms with weather or environmental changes or other aggravating or alleviating factors except as outlined above   No unusual exposure hx or h/o childhood pna/ asthma or knowledge of premature birth.  Current Allergies, Complete Past Medical History, Past Surgical History, Family History, and Social History were reviewed in [employer] record.  ROS  The following are not active complaints unless bolded Hoarseness, sore throat, dysphagia, dental problems, itching, sneezing,  nasal congestion or discharge of excess mucus or purulent secretions, ear ache,   fever, chills, sweats, unintended wt loss or wt gain, classically pleuritic or exertional cp,  orthopnea pnd or arm/hand swelling  or leg swelling, presyncope, palpitations, abdominal pain, anorexia, nausea, vomiting, diarrhea  or change in bowel habits or change in bladder habits, change in stools or change in urine, dysuria, hematuria,  rash, arthralgias, visual complaints, headache, numbness, weakness or ataxia or problems with walking or coordination,  change in mood or  memory.        No outpatient medications have been marked as taking for the 03/07/21 encounter (Appointment) with Tanda Rockers, MD.                    Past Medical History:  Diagnosis Date   Anemia    Anxiety and depression    Bronchitis    Cancer ([place])    uterus   CHF (congestive heart failure)  ([place])    Chronic pain 11/22/2010   COPD (chronic obstructive pulmonary disease) ([place])    Coronary artery disease    Gastroesophageal reflux disease 11/22/2010   Hyperlipidemia    Hypertension    Hypothyroid    Migraine headache 11/22/2010   Near syncope    On home oxygen therapy    started 08/2018   Peripheral vascular disease ([place])    Pneumonia    Tobacco abuse    1/2 pack per day         Objective:     Wts  03/07/2021          *** 01/17/2021    116 09/12/2020         110 12/06/2019       124   09/01/2019      124 07/21/19 124 lb (56.2 kg)  07/12/19 127 lb (57.6 kg)  06/11/19 127 lb (57.6 kg)    Vital signs reviewed  03/07/2021  - Note at rest 02 sats  ***% on ***   General appearance:    ***   Reports: Full dentures   Mod bar***          Assessment

## 2021-03-08 NOTE — Chronic Care Management (AMB) (Signed)
°  Care Management   Note  03/08/2021 Name: Trudie Cervantes MRN: [id number] DOB: Sep 12, 1955  Laquasha Groome Mcmurry is a 66 y.o. year old female who is a primary care patient of [name], [name], MD and is actively engaged with the care management team. I reached out to [name] by phone today to assist with re-scheduling a follow up visit with the RN Case Manager and Licensed Clinical Social Worker  Follow up plan: A third unsuccessful telephone outreach attempt made. A HIPAA compliant phone message was left for the patient providing contact information and requesting a return call. Unable to make contact on outreach attempts x 2. PCP [name], [name], MD notified via routed documentation in medical record. We have been unable to make contact with the patient for follow up. The care management team is available to follow up with the patient after provider conversation with the patient regarding recommendation for care management engagement and subsequent re-referral to the care management team.  If patient returns call to provider office, please advise to call [place] at  [phone].  [facility] Management  Direct Dial: [phone]

## 2021-03-09 ENCOUNTER — Ambulatory Visit: Payer: Medicare Other

## 2021-03-09 DIAGNOSIS — J449 Chronic obstructive pulmonary disease, unspecified: Secondary | ICD-10-CM

## 2021-03-09 DIAGNOSIS — I10 Essential (primary) hypertension: Secondary | ICD-10-CM

## 2021-03-09 NOTE — Chronic Care Management (AMB) (Signed)
°  Care Management   Case Closure Note   03/09/2021 Name: [name] MRN: [id number] DOB: 10/18/1955   Referred by: [name], [name], MD Reason for referral : Chronic Care Management (Case Closure)   Pt has not responded to three outreach attempts. The patient was referred to the case management team for assistance with care management and care coordination. The patient's primary care provider has been notified of our unsuccessful attempts to make or maintain contact with the patient. The care management team is pleased to engage with this patient at any time in the future should he/she be interested in assistance from the care management team.  Follow Up Plan: No further follow up required: Case closed due to unable to maintain contact [place], Jackquline Denmark, CDE Care Management Coordinator [place] [phone]

## 2021-03-09 NOTE — Patient Instructions (Signed)
Visit Information  As a part of your Medicare benefit, you are eligible for care management and care coordination services. I was unable to reach you by phone today but would like to speak with you about your health related needs. Please call me at [phone]. Donye Campanelli RN, BSN,CCM, CDE Care Management Coordinator [facility] [phone]   

## 2021-03-15 ENCOUNTER — Telehealth: Payer: Self-pay | Admitting: *Deleted

## 2021-03-15 NOTE — Chronic Care Management (AMB) (Signed)
°  Care Management   Note  03/15/2021 Name: [name] MRN: [id number] DOB: Mar 14, 1955  [name] is a 66 y.o. year old female who is a primary care patient of [name], [name], MD and is actively engaged with the care management team. I reached out to [name] by phone today to assist with scheduling a follow up visit with the RN Case Manager and Licensed Clinical Social Worker  Follow up plan: Telephone appointment with care management team member scheduled for:SW on 03/23/21 and [facility] 04/06/21  [facility] Management  Direct Dial: [phone]

## 2021-03-19 ENCOUNTER — Other Ambulatory Visit: Payer: Self-pay

## 2021-03-19 ENCOUNTER — Other Ambulatory Visit: Payer: Medicare Other

## 2021-03-19 DIAGNOSIS — Z515 Encounter for palliative care: Secondary | ICD-10-CM

## 2021-03-21 NOTE — Progress Notes (Signed)
PATIENT NAME: [name] DOB: 01-29-1956 MRN: [id number]  PRIMARY CARE PROVIDER: [name], [name], MD  RESPONSIBLE PARTY:  Acct ID - Guarantor Home Phone Work Phone Relationship Acct Type  [id number] [employer], [phone]  Self P/F     [place], [place], [place] [address]    COMMUNITY PALLIATIVE CARE RN NOTE    PLAN OF CARE and INTERVENTION:  ADVANCE CARE PLANNING/GOALS OF CARE: Remain in her home with her husband CAREGIVER EDUCATION: Palliative care and Symptom management. DISEASE STATUS: RN Palliative care telephonic encounter completed with patient today. Patient remains on supplemental oxygen r/t COPD. Patient denies any increased shortness of breath but does still experience this with exertion.  Pain managed by Dr. Dagoberto Ligas. She is still looking for a psychiatry to take over prescribing her meds for anxiety. Patient shared that she finds a lot of comfort in her dog. She declined need for in person visit at this time. She knows to call with any changes or concerns.  HISTORY OF PRESENT ILLNESS: This is a 66 year old female with diagnosis of COPD, CHF, HTN and cerebrovascular disease. Palliative care has been asked to follow for additional support, care and complex decision making.   CODE STATUS: Full Code PPS: 60% PHYSICAL EXAM: deferred    Duration: 81min       Maxwell Caul RN, BSN  Cornelius Moras, RN

## 2021-03-23 ENCOUNTER — Ambulatory Visit (INDEPENDENT_AMBULATORY_CARE_PROVIDER_SITE_OTHER): Payer: Medicare Other | Admitting: Licensed Clinical Social Worker

## 2021-03-23 DIAGNOSIS — J441 Chronic obstructive pulmonary disease with (acute) exacerbation: Secondary | ICD-10-CM

## 2021-03-23 DIAGNOSIS — G894 Chronic pain syndrome: Secondary | ICD-10-CM

## 2021-03-23 DIAGNOSIS — J449 Chronic obstructive pulmonary disease, unspecified: Secondary | ICD-10-CM

## 2021-03-23 DIAGNOSIS — E782 Mixed hyperlipidemia: Secondary | ICD-10-CM

## 2021-03-23 DIAGNOSIS — F419 Anxiety disorder, unspecified: Secondary | ICD-10-CM

## 2021-03-23 DIAGNOSIS — F32A Depression, unspecified: Secondary | ICD-10-CM

## 2021-03-29 ENCOUNTER — Telehealth: Payer: Self-pay | Admitting: Pharmacist

## 2021-03-29 DIAGNOSIS — J441 Chronic obstructive pulmonary disease with (acute) exacerbation: Secondary | ICD-10-CM | POA: Diagnosis not present

## 2021-03-29 NOTE — Chronic Care Management (AMB) (Signed)
° ° °  Chronic Care Management Pharmacy Assistant   Name: [name]  MRN: [id number] DOB: 07-Aug-1955 03/29/21 APPOINTMENT REMINDER   Called Patient No answer, left message of appointment on 04/02/21 at 2 via telephone visit with Jeni Salles, Pharm D.   Notified to have all medications, supplements, blood pressure and/or blood sugar logs available during appointment and to return call if need to reschedule.     Care Gaps: BP- 102/68 ( 03/05/21) Zoster Vaccine - Overdue Pap - Overdue Mammogram - Overdue  Star Rating Drug: Empagliflozin (Jardiance ) 10 mg - Last filled 03/24/2021 30 DS At [facility]  Any gaps in medications fill history? none   Medications: Outpatient Encounter Medications as of 03/29/2021  Medication Sig   albuterol (PROAIR HFA) 108 (90 Base) MCG/ACT inhaler Inhale 2 puffs into the lungs every 4 (four) hours as needed for wheezing or shortness of breath (cough).   ALPRAZolam (XANAX) 0.5 MG tablet Take 1 tablet (0.5 mg total) by mouth 3 (three) times daily as needed for anxiety. for anxiety   aspirin EC 81 MG tablet Take 81 mg by mouth daily.   Budeson-Glycopyrrol-Formoterol (BREZTRI AEROSPHERE) 160-9-4.8 MCG/ACT AERO Inhale 2 puffs into the lungs in the morning and at bedtime.   DULoxetine (CYMBALTA) 60 MG capsule Take 1 capsule (60 mg total) by mouth 2 (two) times daily.   empagliflozin (JARDIANCE) 10 MG TABS tablet Take 1 tablet (10 mg total) by mouth daily.   ezetimibe (ZETIA) 10 MG tablet Take 10 mg by mouth daily.   levothyroxine (SYNTHROID) 50 MCG tablet TAKE 1 TABLET IN THE MORNING BEFORE BREAKFAST.   midodrine (PROAMATINE) 5 MG tablet TAKE ONE TABLET BY MOUTH 3 TIMES A DAY WITH MEALS   mirtazapine (REMERON) 15 MG tablet TAKE (1) TABLET BY MOUTH AT BEDTIME.   nicotine (NICODERM CQ - DOSED IN MG/24 HR) 7 mg/24hr patch Place 1 patch (7 mg total) onto the skin daily.   oxyCODONE (OXY IR/ROXICODONE) 5 MG immediate release tablet TAKE (1) TABLET BY  MOUTH EVERY SIX HOURS AS NEEDED FOR SEVERE PAIN.   pantoprazole (PROTONIX) 40 MG tablet TAKE ONE TABLET BY MOUTH TWICE DAILY BEFORE A MEAL.   predniSONE (DELTASONE) 10 MG tablet Take  4 each am x 2 days,   2 each am x 2 days,  1 each am x 2 days and stop   pregabalin (LYRICA) 100 MG capsule Take 1 capsule (100 mg total) by mouth 3 (three) times daily.   QUEtiapine (SEROQUEL) 50 MG tablet Take 1-2 tablets (50-100 mg total) by mouth at bedtime. TAKE 1 OR 2 TABLETS BY MOUTH AT BEDTIME for sleep   spironolactone (ALDACTONE) 25 MG tablet Take 0.5 tablets (12.5 mg total) by mouth daily.   torsemide (DEMADEX) 20 MG tablet Take 1 tablet (20 mg total) by mouth daily as needed (As needed for swelling or weight gain).   No facility-administered encounter medications on file as of 03/29/2021.     [facility] Clinical Pharmacist Assistant [phone]

## 2021-03-30 NOTE — Chronic Care Management (AMB) (Signed)
Chronic Care Management    Clinical Social Work Note  03/30/2021 Name: [name] MRN: [id number] DOB: [birth year]  [name] is a 66 y.o. year old female who is a primary care patient of [name], [name], MD. The CCM team was consulted to assist the patient with chronic disease management and/or care coordination needs related to: [employer]  and [place] and Resources.   Engaged with patient by telephone for follow up visit in response to provider referral for social work chronic care management and care coordination services.   Consent to Services:  The patient was given information about Chronic Care Management services, agreed to services, and gave verbal consent prior to initiation of services.  Please see initial visit note for detailed documentation.   Patient agreed to services and consent obtained.   Assessment: Review of patient past medical history, allergies, medications, and health status, including review of relevant consultants reports was performed today as part of a comprehensive evaluation and provision of chronic care management and care coordination services.     SDOH (Social Determinants of Health) assessments and interventions performed:    Advanced Directives Status: Not addressed in this encounter.  CCM Care Plan  Allergies  Allergen Reactions   Mirtazapine Other (See Comments)    halucinations   Acetaminophen-Codeine Nausea And Vomiting   Aspirin Nausea And Vomiting    (can take coated aspirin)   Astelin [Azelastine]     Can not remember    Bactrim [Sulfamethoxazole-Trimethoprim]     Can not remember   Celecoxib Other (See Comments)    "does the opposite of what it is suppose to do"   Fentanyl     Can not remember   Levaquin [Levofloxacin In D5w] Itching   Lipitor [Atorvastatin] Other (See Comments)    Myalgias    Neomycin     Can not remember   Salicylates     Can not remember   Sulfa Antibiotics  Nausea And Vomiting   Adhesive [Tape] Rash   Ciprofloxacin Rash   Latex Rash   Tegretol [Carbamazepine] Rash    Outpatient Encounter Medications as of 03/23/2021  Medication Sig   albuterol (PROAIR HFA) 108 (90 Base) MCG/ACT inhaler Inhale 2 puffs into the lungs every 4 (four) hours as needed for wheezing or shortness of breath (cough).   ALPRAZolam (XANAX) 0.5 MG tablet Take 1 tablet (0.5 mg total) by mouth 3 (three) times daily as needed for anxiety. for anxiety   aspirin EC 81 MG tablet Take 81 mg by mouth daily.   Budeson-Glycopyrrol-Formoterol (BREZTRI AEROSPHERE) 160-9-4.8 MCG/ACT AERO Inhale 2 puffs into the lungs in the morning and at bedtime.   DULoxetine (CYMBALTA) 60 MG capsule Take 1 capsule (60 mg total) by mouth 2 (two) times daily.   empagliflozin (JARDIANCE) 10 MG TABS tablet Take 1 tablet (10 mg total) by mouth daily.   ezetimibe (ZETIA) 10 MG tablet Take 10 mg by mouth daily.   levothyroxine (SYNTHROID) 50 MCG tablet TAKE 1 TABLET IN THE MORNING BEFORE BREAKFAST.   midodrine (PROAMATINE) 5 MG tablet TAKE ONE TABLET BY MOUTH 3 TIMES A DAY WITH MEALS   mirtazapine (REMERON) 15 MG tablet TAKE (1) TABLET BY MOUTH AT BEDTIME.   nicotine (NICODERM CQ - DOSED IN MG/24 HR) 7 mg/24hr patch Place 1 patch (7 mg total) onto the skin daily.   oxyCODONE (OXY IR/ROXICODONE) 5 MG immediate release tablet TAKE (1) TABLET BY MOUTH EVERY SIX HOURS AS NEEDED FOR  SEVERE PAIN.   pantoprazole (PROTONIX) 40 MG tablet TAKE ONE TABLET BY MOUTH TWICE DAILY BEFORE A MEAL.   predniSONE (DELTASONE) 10 MG tablet Take  4 each am x 2 days,   2 each am x 2 days,  1 each am x 2 days and stop   pregabalin (LYRICA) 100 MG capsule Take 1 capsule (100 mg total) by mouth 3 (three) times daily.   QUEtiapine (SEROQUEL) 50 MG tablet Take 1-2 tablets (50-100 mg total) by mouth at bedtime. TAKE 1 OR 2 TABLETS BY MOUTH AT BEDTIME for sleep   spironolactone (ALDACTONE) 25 MG tablet Take 0.5 tablets (12.5 mg total) by  mouth daily.   torsemide (DEMADEX) 20 MG tablet Take 1 tablet (20 mg total) by mouth daily as needed (As needed for swelling or weight gain).   No facility-administered encounter medications on file as of 03/23/2021.    Patient Active Problem List   Diagnosis Date Noted   Adjustment disorder with mixed anxiety and depressed mood 03/05/2021   Abnormal stress test    Shortness of breath    Palliative care by specialist    Acute on chronic respiratory failure with hypoxia and hypercapnia ([place]) 07/26/2020   Acute on chronic combined systolic and diastolic CHF (congestive heart failure) ([place]) 07/26/2020   Idiopathic hypotension 10/08/2019   Encounter for long-term use of opiate analgesic 03/12/2019   IDA (iron deficiency anemia) 02/17/2019   Gastritis and gastroduodenitis 02/17/2019   COPD  GOLD II / still smoking  12/08/2018   Chronic respiratory failure with hypoxia and hypercapnia ([place]) 12/08/2018   Radiculopathy due to lumbar intervertebral disc disorder 10/29/2018   Sciatica of left side 10/29/2018   Anemia    Gastrointestinal hemorrhage    Elevated LFTs    Hypokalemia 08/24/2018   COPD exacerbation ([place]) 03/18/2014   Acute kidney injury ([place]) 03/18/2014   Acute respiratory failure with hypoxia ([place]) 03/18/2014   CAP (community acquired pneumonia) 03/17/2014   Pneumonia 03/17/2014   HTN (hypertension) 12/03/2012   Sepsis ([place]) 02/21/2012   PNA (pneumonia) 02/21/2012   Adrenal insufficiency ([place]) 02/21/2012   Subclavian steal syndrome 02/21/2012   Chronic pain 11/22/2010   Migraine headache 11/22/2010   Gastroesophageal reflux disease 11/22/2010   Goals of care, counseling/discussion 11/22/2010   Cerebrovascular disease 07/24/2010   Anxiety and depression    Hyperlipidemia    Hypothyroid    Cigarette smoker     Conditions to be addressed/monitored: HTN, COPD, Anxiety, Depression, and Chronic Pain ; Financial strain affording portable oxygen  Care Plan : LCSW Plan of  Care  Updates made by Rebekah Chesterfield, LCSW since 03/30/2021 12:00 AM     Problem: Resume and Stick with Counseling to Reduce and Manage Symptoms of Anxiety and Depression.   Priority: High     Long-Range Goal: Resume and Stick with Counseling to Reduce and Manage Symptoms of Anxiety and Depression.   Start Date: 12/05/2020  Expected End Date: 03/07/2021  This Visit's Progress: On track  Recent Progress: On track  Priority: High  Note:   Current Barriers:   Acute Mental Health needs related to Hypertension, COPD Gold II with Exacerbation, Chronic Respiratory Failure with Hypoxia and Hypercapnia, Anxiety and Depression requires Support, Education, Resources, Referrals, Advocacy, and Care Coordination, in order to meet unmet mental health needs. Clinical Goal(s):  Patient will work with LCSW to reduce and manage symptoms of Anxiety and Depression, until established with a community provider.   Patient will increase knowledge and/or ability of:  Coping Skills, Healthy Habits, Self-Management Skills, Stress Reduction, Home Safety and Utilizing [employer] and Resources.   Clinical Interventions:  Patient reports difficulty managing pain. To cope, pt will pace and is "up and down" due to inability to get comfortable. She prioritizes rest when she can noting night meds work well Patient reports compliance with medication management. She has established with pain management and feels supported Patient is currently looking for a psychiatrist, preferably a [facility] provider in [facility] Patient shared increase in stress triggered by having to pay out of pocket for portable oxygen.  She would like to have he independence that she used to noting being in the home often has negatively impacted pt's mental health. LCSW discussed strategies to assist with management of symptoms Active Listening/Reflection utilized, Emotional Support provided, Client-Centered Therapy initiated, Cognitive  Behavioral Therapy performed, and Verbalization of Feelings encouraged.  Collaboration with Primary Care Physician, Dr. Lelon Frohlich regarding development and update of comprehensive plan of care as evidenced by provider attestation and co-signature. Inter-disciplinary care team collaboration (see longitudinal plan of care). Patient Goals/Self-Care Activities: Continue to receive personal counseling with LCSW, on a bi-weekly basis, to reduce and manage symptoms of Anxiety and Depression, until well-established with Vivi Martens, Licensed Professional Counselor with [facility] in [place], [place] [phone]).   Please contact Vivi Martens, Licensed Professional Counselor with [facility] in [place], [place] - matched community provider through [employer] - in an effort to establish ongoing mental health counseling and supportive services.  ~  Contact LCSW to provide appointment time and date. Once you have established ongoing mental health counseling and supportive services with Vivi Martens, Licensed Professional Counselor with [facility] in [place], [place], Ms. Carlyon Prows will match you with a psychiatrist within the practice, to provide psychotropic medication management and monitoring. Contact LCSW directly (# [phone]) if you have questions, need assistance, or if additional social work needs are identified between now and our next scheduled telephone outreach call.        Christa See, MSW, [email or web address] Phone [phone] 8:00 AM

## 2021-03-30 NOTE — Patient Instructions (Signed)
Visit Information  Thank you for taking time to visit with me today. Please don't hesitate to contact me if I can be of assistance to you before our next scheduled telephone appointment.  Following are the goals we discussed today:  Patient Goals/Self-Care Activities: Continue to receive personal counseling with LCSW, on a bi-weekly basis, to reduce and manage symptoms of Anxiety and Depression, until well-established with Vivi Martens, Licensed Professional Counselor with [facility] in [place], [place] [phone]).   Please contact Vivi Martens, Licensed Professional Counselor with [facility] in [place], [place] - matched community provider through [employer] - in an effort to establish ongoing mental health counseling and supportive services.  ~  Contact LCSW to provide appointment time and date. Once you have established ongoing mental health counseling and supportive services with Vivi Martens, Licensed Professional Counselor with [facility] in [place], [place], Ms. Carlyon Prows will match you with a psychiatrist within the practice, to provide psychotropic medication management and monitoring. Contact LCSW directly (# [phone]) if you have questions, need assistance, or if additional social work needs are identified between now and our next scheduled telephone outreach call.  Our next appointment is by telephone on 05/03/21 at 3:00 PM  Please call the care guide team at [phone] if you need to cancel or reschedule your appointment.   If you are experiencing a Mental Health or [place] or need someone to talk to, please call the [place] National Suicide Prevention Lifeline: 780-868-1809 or TTY: 351 233 1936 TTY (573)[phone]) to talk to a trained counselor call 911   The patient verbalized understanding of instructions, educational materials, and care plan provided today and declined  offer to receive copy of patient instructions, educational materials, and care plan.   Christa See, MSW, [email or web address] Phone [phone] 8:01 AM

## 2021-04-02 ENCOUNTER — Telehealth: Payer: Self-pay | Admitting: Pharmacist

## 2021-04-02 ENCOUNTER — Telehealth: Payer: Medicare Other

## 2021-04-02 NOTE — Telephone Encounter (Signed)
°  Chronic Care Management   Outreach Note  04/02/2021 Name: Lara Palinkas MRN: [id number] DOB: Oct 28, 1955  Referred by: [name], [name], MD  Patient had a phone appointment scheduled with clinical pharmacist today.  An unsuccessful telephone outreach was attempted today. The patient was referred to the pharmacist for assistance with care management and care coordination.   If possible, a message was left to return call to: [phone] or to [facility]: [place], PharmD, [place] Pharmacist [place] at [place]

## 2021-04-02 NOTE — Progress Notes (Unsigned)
Chronic Care Management Pharmacy Note  04/02/2021 Name:  [name] MRN:  [id number] DOB:  07-01-1955  Summary: BP is low and pt is not checking consistently at home LDL not at goal < 70  Recommendations/Changes made from today's visit: -Recommended at least weekly monitoring of BP at home -Recommend repeat TSH and adjustment of levothyroxine dose -Provided behavioral health telephone number -Provided QUIT line phone number to request patches/lozenges  Plan: BP assessment in 1-2 months   Subjective: [name] is an 66 y.o. year old female who is a primary patient of [name], [name], MD.  The CCM team was consulted for assistance with disease management and care coordination needs.    Engaged with patient face to face for initial visit in response to provider referral for pharmacy case management and/or care coordination services.   Consent to Services:  The patient was given the following information about Chronic Care Management services today, agreed to services, and gave verbal consent: 1. CCM service includes personalized support from designated clinical staff supervised by the primary care provider, including individualized plan of care and coordination with other care providers 2. 24/7 contact phone numbers for assistance for urgent and routine care needs. 3. Service will only be billed when office clinical staff spend 20 minutes or more in a month to coordinate care. 4. Only one practitioner may furnish and bill the service in a calendar month. 5.The patient may stop CCM services at any time (effective at the end of the month) by phone call to the office staff. 6. The patient will be responsible for cost sharing (co-pay) of up to 20% of the service fee (after annual deductible is met). Patient agreed to services and consent obtained.  Patient Care Team: [name], [name], MD as PCP - General (Internal Medicine) Satira Sark, MD as PCP -  Cardiology (Cardiology) Rothbart, Cristopher Estimable, MD (Inactive) (Cardiology) Danie Binder, MD (Inactive) as Consulting Physician (Gastroenterology) Viona Gilmore, [facility] as Pharmacist (Pharmacist) Rebekah Chesterfield, LCSW as Social Worker (Licensed Clinical Social Worker) Dimitri Ped, RN as [facility]  Recent office visits: 03/23/21 Christa See, LCSW: Patient presented for CCM visit for establishing phychiatric care.   11/23/20 [name], [name], MD - Patient presented for COPD GOLD II visit. No medication changes.   11-03-2020 Dimitri Ped, RN - Patient presented for CHF, HTN, COPD, Chronic Pain, Anxiety and Depression. No medication changes.    Recent consult visits: 03/05/21 Courtney Heys, MD (Physical Med) - Patient presented for radiculopathy follow up. Increased Lyrica to 113m TID and increased Xanax 0.5 mg to 3 x a day.  02/21/21 BBernerd Pho PA-C (cardiology): Patient presented for CHF follow up. Decreased torsemide to PRN dosing and started spironolactone 12.5 mg daily.  01/18/21 MSatira Sark MD ([facility] - Patient presented for Pre-Op   01/17/21 WTanda Rockers MD (Pulmonology) - Patient presented for Oxygen desaturation and other concerns. Prescribed Azithromycin 250 mg and Prednisone 10 mg.  01/11/21 BBernerd Pho PA-C (cardiology): Patient presented for CHF follow up.  No medication changes. Follow up in 3 weeks.  01/02/21 QVerta Ellen, NP (Cardiac rehab) - Patient presented for Myocardial perfusion.  11/17/20 Lovorn, MJinny Blossom MD (Physical Med) - Patient presented for Chronic pain and other concerns. Changed Oxycodone to 5 mg PRN Q 6hrs.  Hospital visits: Medication Reconciliation was completed by comparing discharge summary, patients EMR and Pharmacy list, and upon discussion  with patient.   Patient presented to [facility] on  01/30/21 due to RIGHT/LEFT HEART CATH AND CORONARY  ANGIOGRAPHY. Patient was present for 8 hours.   New?Medications Started at [facility] Discharge:?? -started  None   Medication Changes at Hospital Discharge: -Changed  None   Medications Discontinued at Hospital Discharge: -Stopped  azithromycin 250 MG tablet   Medications that remain the same after Hospital Discharge:??  -All other medications will remain the same.   Objective:  Lab Results  Component Value Date   CREATININE 1.41 (H) 01/18/2021   BUN 19 01/18/2021   GFR 35.05 (L) 08/22/2020   GFRNONAA 42 (L) 01/18/2021   GFRAA 49 (L) 06/11/2019   NA 140 01/30/2021   NA 139 01/30/2021   K 4.0 01/30/2021   K 4.0 01/30/2021   CALCIUM 9.6 01/18/2021   CO2 40 (H) 01/18/2021   GLUCOSE 140 (H) 01/18/2021    Lab Results  Component Value Date/Time   GFR 35.05 (L) 08/22/2020 04:05 PM   GFR 52.48 (L) 12/08/2018 03:36 PM    Last diabetic Eye exam: No results found for: HMDIABEYEEXA  Last diabetic Foot exam: No results found for: HMDIABFOOTEX   Lab Results  Component Value Date   CHOL 129 07/28/2020   HDL 28 (L) 07/28/2020   LDLCALC 79 07/28/2020   TRIG 108 07/28/2020   CHOLHDL 4.6 07/28/2020    Hepatic Function Latest Ref Rng & Units 08/22/2020 07/26/2020 06/11/2019  Total Protein 6.0 - 8.3 g/dL 7.4 6.5 5.8(L)  Albumin 3.5 - 5.2 g/dL 4.1 3.3(L) 2.7(L)  AST 0 - 37 U/L 19 39 13(L)  ALT 0 - 35 U/L '9 30 9  ' Alk Phosphatase 39 - 117 U/L 78 135(H) 90  Total Bilirubin 0.2 - 1.2 mg/dL 0.6 2.1(H) 0.6  Bilirubin, Direct 0.00 - 0.40 mg/dL - - -    Lab Results  Component Value Date/Time   TSH 1.240 11/17/2020 02:10 PM   TSH 0.336 (L) 07/28/2020 01:25 AM   TSH 3.220 05/31/2019 11:52 AM   FREET4 0.87 07/28/2020 10:57 AM    CBC Latest Ref Rng & Units 01/30/2021 01/30/2021 01/30/2021  WBC 4.0 - 10.5 K/uL - - -  Hemoglobin 12.0 - 15.0 g/dL 12.6 12.6 12.9  Hematocrit 36.0 - 46.0 % 37.0 37.0 38.0  Platelets 150 - 400 K/uL - - -    No results found for: VD25OH  Clinical  ASCVD: Yes  The ASCVD Risk score (Arnett DK, et al., 2019) failed to calculate for the following reasons:   The valid total cholesterol range is 130 to 320 mg/dL    Depression screen [facility] 2/9 03/05/2021 12/05/2020 09/26/2020  Decreased Interest 1 0 0  Down, Depressed, Hopeless '1 1 1  ' PHQ - 2 Score '2 1 1  ' Altered sleeping - - -  Tired, decreased energy - - -  Change in appetite - - -  Feeling bad or failure about yourself  - - -  Trouble concentrating - - -  Moving slowly or fidgety/restless - - -  Suicidal thoughts - - -  PHQ-9 Score - - -  Difficult doing work/chores - - -  Some recent data might be hidden     Social History   Tobacco Use  Smoking Status Every Day   Packs/day: 0.25   Years: 40.00   Pack years: 10.00   Types: Cigarettes   Start date: 09/06/1972   Passive exposure: Current  Smokeless Tobacco Never  Tobacco Comments   Patient has  1-2 cigs a day and trying to quit.    BP Readings from Last 3 Encounters:  03/05/21 102/68  02/21/21 96/64  01/30/21 (!) 93/37   Pulse Readings from Last 3 Encounters:  03/05/21 88  02/21/21 88  01/30/21 78   Wt Readings from Last 3 Encounters:  03/05/21 118 lb (53.5 kg)  02/21/21 115 lb 3.2 oz (52.3 kg)  01/30/21 120 lb (54.4 kg)   BMI Readings from Last 3 Encounters:  03/05/21 21.58 kg/m  02/21/21 21.07 kg/m  01/30/21 22.67 kg/m    Assessment/Interventions: Review of patient past medical history, allergies, medications, health status, including review of consultants reports, laboratory and other test data, was performed as part of comprehensive evaluation and provision of chronic care management services.   SDOH:  (Social Determinants of Health) assessments and interventions performed: Yes   SDOH Screenings   Alcohol Screen: Low Risk    Last Alcohol Screening Score (AUDIT): 1  Depression (PHQ2-9): Low Risk    PHQ-2 Score: 2  Financial Resource Strain: Low Risk    Difficulty of Paying Living Expenses: Not very  hard  Food Insecurity: No Food Insecurity   Worried About Charity fundraiser in the Last Year: Never true   Ran Out of Food in the Last Year: Never true  Housing: Low Risk    Last Housing Risk Score: 0  Physical Activity: Inactive   Days of Exercise per Week: 0 days   Minutes of Exercise per Session: 0 min  Social Connections: Engineer, building services of Communication with Friends and Family: More than three times a week   Frequency of Social Gatherings with Friends and Family: More than three times a week   Attends Religious Services: More than 4 times per year   Active Member of [employer] or Organizations: Yes   Attends Archivist Meetings: 1 to 4 times per year   Marital Status: Married  Stress: No Stress Concern Present   Feeling of Stress : Only a little  Tobacco Use: High Risk   Smoking Tobacco Use: Every Day   Smokeless Tobacco Use: Never   Passive Exposure: Current  Transportation Needs: No Transportation Needs   Lack of Transportation (Medical): No   Lack of Transportation (Non-Medical): No   Patient reports her biggest concerns right now is that her knee is swelling but she has been keeping ice on it and is unable to use salon pas because this made her break out. She is also working on getting a psychiatrist as she wants one to help better manage her depression. She recently had a dog that passed away who was also helping with her depression. She is currently trying out a german shepherd in hopes that this will help some.  Patient reports she is having issues with affording some of her medications right now.  CCM Care Plan  Allergies  Allergen Reactions   Mirtazapine Other (See Comments)    halucinations   Acetaminophen-Codeine Nausea And Vomiting   Aspirin Nausea And Vomiting    (can take coated aspirin)   Astelin [Azelastine]     Can not remember    Bactrim [Sulfamethoxazole-Trimethoprim]     Can not remember   Celecoxib Other (See Comments)     "does the opposite of what it is suppose to do"   Fentanyl     Can not remember   Levaquin [Levofloxacin In D5w] Itching   Lipitor [Atorvastatin] Other (See Comments)    Myalgias  Neomycin     Can not remember   Salicylates     Can not remember   Sulfa Antibiotics Nausea And Vomiting   Adhesive [Tape] Rash   Ciprofloxacin Rash   Latex Rash   Tegretol [Carbamazepine] Rash    Medications Reviewed Today     Reviewed by Courtney Heys, MD (Physician) on 03/05/21 at 1402  Med List Status: <None>   Medication Order Taking? Sig Documenting Provider Last Dose Status Informant  albuterol (PROAIR HFA) 108 (90 Base) MCG/ACT inhaler [id number] Yes Inhale 2 puffs into the lungs every 4 (four) hours as needed for wheezing or shortness of breath (cough). Tanda Rockers, MD Taking Active Self  ALPRAZolam Duanne Moron) 0.5 MG tablet [id number] Yes Take 1 tablet (0.5 mg total) by mouth 3 (three) times daily as needed for anxiety. for anxiety Lovorn, Jinny Blossom, MD  Active   aspirin EC 81 MG tablet [id number] Yes Take 81 mg by mouth daily. [provider] Taking Active Self  Budeson-Glycopyrrol-Formoterol (BREZTRI AEROSPHERE) 160-9-4.8 MCG/ACT AERO [id number] Yes Inhale 2 puffs into the lungs in the morning and at bedtime. Tanda Rockers, MD Taking Active Self  DULoxetine (CYMBALTA) 60 MG capsule [id number] Yes Take 1 capsule (60 mg total) by mouth 2 (two) times daily. Lovorn, Jinny Blossom, MD  Active   empagliflozin (JARDIANCE) 10 MG TABS tablet [id number] Yes Take 1 tablet (10 mg total) by mouth daily. Verta Ellen., NP Taking Active Self  ezetimibe (ZETIA) 10 MG tablet [id number] Yes Take 10 mg by mouth daily. [provider] Taking Active Self  levothyroxine (SYNTHROID) 50 MCG tablet [id number] Yes TAKE 1 TABLET IN THE MORNING BEFORE BREAKFAST. Lovorn, Jinny Blossom, MD Taking Active Self  midodrine (PROAMATINE) 5 MG tablet [id number] Yes TAKE ONE TABLET BY MOUTH 3 TIMES A DAY WITH MEALS Lovorn, Megan, MD   Active   mirtazapine (REMERON) 15 MG tablet [id number] Yes TAKE (1) TABLET BY MOUTH AT BEDTIME. Lovorn, Jinny Blossom, MD  Active   nicotine (NICODERM CQ - DOSED IN MG/24 HR) 7 mg/24hr patch [id number] Yes Place 1 patch (7 mg total) onto the skin daily. Tanda Rockers, MD Taking Active Self  oxyCODONE (OXY IR/ROXICODONE) 5 MG immediate release tablet [id number] Yes TAKE (1) TABLET BY MOUTH EVERY SIX HOURS AS NEEDED FOR SEVERE PAIN. Lovorn, Jinny Blossom, MD  Active   pantoprazole (PROTONIX) 40 MG tablet [id number] Yes TAKE ONE TABLET BY MOUTH TWICE DAILY BEFORE A MEAL. Annitta Needs, NP Taking Active Self  predniSONE (DELTASONE) 10 MG tablet [id number] Yes Take  4 each am x 2 days,   2 each am x 2 days,  1 each am x 2 days and stop Tanda Rockers, MD Taking Active Self  pregabalin (LYRICA) 100 MG capsule [id number] Yes Take 1 capsule (100 mg total) by mouth 3 (three) times daily. Lovorn, Jinny Blossom, MD  Active   QUEtiapine (SEROQUEL) 50 MG tablet [id number] Yes Take 1-2 tablets (50-100 mg total) by mouth at bedtime. TAKE 1 OR 2 TABLETS BY MOUTH AT BEDTIME for sleep Lovorn, Megan, MD  Active   spironolactone (ALDACTONE) 25 MG tablet [id number] Yes Take 0.5 tablets (12.5 mg total) by mouth daily. Erma Heritage, PA-C Taking Active   torsemide (DEMADEX) 20 MG tablet [id number] Yes Take 1 tablet (20 mg total) by mouth daily as needed (As needed for swelling or weight gain). Erma Heritage, PA-C Taking Active             Patient Active Problem List   Diagnosis  Date Noted   Adjustment disorder with mixed anxiety and depressed mood 03/05/2021   Abnormal stress test    Shortness of breath    Palliative care by specialist    Acute on chronic respiratory failure with hypoxia and hypercapnia ([place]) 07/26/2020   Acute on chronic combined systolic and diastolic CHF (congestive heart failure) ([place]) 07/26/2020   Idiopathic hypotension 10/08/2019   Encounter for long-term use of opiate analgesic 03/12/2019   IDA (iron  deficiency anemia) 02/17/2019   Gastritis and gastroduodenitis 02/17/2019   COPD  GOLD II / still smoking  12/08/2018   Chronic respiratory failure with hypoxia and hypercapnia ([place]) 12/08/2018   Radiculopathy due to lumbar intervertebral disc disorder 10/29/2018   Sciatica of left side 10/29/2018   Anemia    Gastrointestinal hemorrhage    Elevated LFTs    Hypokalemia 08/24/2018   COPD exacerbation ([place]) 03/18/2014   Acute kidney injury ([place]) 03/18/2014   Acute respiratory failure with hypoxia ([place]) 03/18/2014   CAP (community acquired pneumonia) 03/17/2014   Pneumonia 03/17/2014   HTN (hypertension) 12/03/2012   Sepsis ([place]) 02/21/2012   PNA (pneumonia) 02/21/2012   Adrenal insufficiency ([place]) 02/21/2012   Subclavian steal syndrome 02/21/2012   Chronic pain 11/22/2010   Migraine headache 11/22/2010   Gastroesophageal reflux disease 11/22/2010   Goals of care, counseling/discussion 11/22/2010   Cerebrovascular disease 07/24/2010   Anxiety and depression    Hyperlipidemia    Hypothyroid    Cigarette smoker     Immunization History  Administered Date(s) Administered   Fluad Quad(high Dose 65+) 11/19/2018   Influenza Inj Mdck Quad With Preservative 11/10/2017   Influenza,inj,Quad PF,6+ Mos 11/23/2020   PFIZER(Purple Top)SARS-COV-2 Vaccination 04/29/2019, 05/22/2019   Pneumococcal Polysaccharide-23 02/22/2012    Conditions to be addressed/monitored:  Hypertension, Hyperlipidemia, Heart Failure, GERD, COPD, Hypothyroidism, Depression, Anxiety, and Tobacco use  Conditions addressed this visit: ***  There are no care plans that you recently modified to display for this patient.   Medication Assistance: Application for Breztri  medication assistance program. in process.  Anticipated assistance start date 01/03/21.  See plan of care for additional detail.  Compliance/Adherence/Medication fill history: Care Gaps: Shingrix, PAP smear, COVID booster, tetanus,  mammogram BP-  102/68 ( 03/05/21)  Star-Rating Drugs: Empagliflozin (Jardiance ) 10 mg - Last filled 03/24/2021 30 DS At [facility]  Patient's preferred pharmacy is:  [place] [place] [address] Phone: [phone] Fax: [phone]   Uses pill box? Yes Pt endorses 99% compliance  We discussed: Current pharmacy is preferred with insurance plan and patient is satisfied with pharmacy services Patient decided to: Continue current medication management strategy  Care Plan and Follow Up Patient Decision:  Patient agrees to Care Plan and Follow-up.  Plan: The care management team will reach out to the patient again over the next 30 days.  Jeni Salles, PharmD, [facility] Pharmacist [facility] [place]

## 2021-04-03 DIAGNOSIS — E782 Mixed hyperlipidemia: Secondary | ICD-10-CM | POA: Diagnosis not present

## 2021-04-03 DIAGNOSIS — I10 Essential (primary) hypertension: Secondary | ICD-10-CM

## 2021-04-03 DIAGNOSIS — J449 Chronic obstructive pulmonary disease, unspecified: Secondary | ICD-10-CM | POA: Diagnosis not present

## 2021-04-03 DIAGNOSIS — F419 Anxiety disorder, unspecified: Secondary | ICD-10-CM

## 2021-04-03 DIAGNOSIS — F32A Depression, unspecified: Secondary | ICD-10-CM

## 2021-04-03 DIAGNOSIS — J441 Chronic obstructive pulmonary disease with (acute) exacerbation: Secondary | ICD-10-CM

## 2021-04-06 ENCOUNTER — Ambulatory Visit (INDEPENDENT_AMBULATORY_CARE_PROVIDER_SITE_OTHER): Payer: Medicare Other

## 2021-04-06 DIAGNOSIS — I10 Essential (primary) hypertension: Secondary | ICD-10-CM

## 2021-04-06 DIAGNOSIS — F32A Depression, unspecified: Secondary | ICD-10-CM

## 2021-04-06 DIAGNOSIS — E782 Mixed hyperlipidemia: Secondary | ICD-10-CM

## 2021-04-06 DIAGNOSIS — I5043 Acute on chronic combined systolic (congestive) and diastolic (congestive) heart failure: Secondary | ICD-10-CM

## 2021-04-06 DIAGNOSIS — J449 Chronic obstructive pulmonary disease, unspecified: Secondary | ICD-10-CM

## 2021-04-06 DIAGNOSIS — F419 Anxiety disorder, unspecified: Secondary | ICD-10-CM

## 2021-04-06 NOTE — Patient Instructions (Signed)
Visit Information ? ?Thank you for taking time to visit with me today. Please don't hesitate to contact me if I can be of assistance to you before our next scheduled telephone appointment. ? ?Following are the goals we discussed today:  ?Take all medications as prescribed ?Attend all scheduled provider appointments ?Call pharmacy for medication refills 3-7 days in advance of running out of medications ?Call provider office for new concerns or questions  ?Work with the Education officer, museum to address care coordination needs and will continue to work with the clinical team to address health care and disease management related needs ?call office if I gain more than 2 pounds in one day or 5 pounds in one week ?keep legs up while sitting ?watch for swelling in feet, ankles and legs every day ?weigh myself daily ?follow rescue plan if symptoms flare-up ?eat more whole grains, fruits and vegetables, lean meats and healthy fats ?eliminate smoking in my home ?identify and remove indoor air pollutants ?limit outdoor activity during cold weather ?do breathing exercises every day ?eliminate symptom triggers at home ?follow rescue plan if symptoms flare-up ?get at least 7 to 8 hours of sleep at night ?practice relaxation or meditation daily ?check blood pressure 3 times per week ?choose a place to take my blood pressure (home, clinic or office, retail store) ?write blood pressure results in a log or diary ?take blood pressure log to all doctor appointments ?call doctor for signs and symptoms of high blood pressure ?call for medicine refill 2 or 3 days before it runs out ?take all medications exactly as prescribed ?call doctor with any symptoms you believe are related to your medicine ? ?Our next appointment is by telephone on 05/10/21 at 3 PM ? ?Please call the care guide team at [phone] if you need to cancel or reschedule your appointment.  ? ?If you are experiencing a Mental Health or [place] or need someone to  talk to, please call the Suicide and Crisis Lifeline: 988 ?call the [place] National Suicide Prevention Lifeline: (305) 044-5250 or TTY: (919) 220-2342 TTY (604)204-4044) to talk to a trained counselor ?call 1-800-273-TALK (toll free, 24 hour hotline) ?call the [facility]: [phone] ?call 911  ? ?The patient verbalized understanding of instructions, educational materials, and care plan provided today and agreed to receive a mailed copy of patient instructions, educational materials, and care plan.  ? ?Peter Garter RN, BSN,CCM, CDE ?Care Management Coordinator ?[facility] ?[phone]   ?

## 2021-04-06 NOTE — Chronic Care Management (AMB) (Signed)
Chronic Care Management   CCM RN Visit Note  04/06/2021 Name: [name] MRN: [id number] DOB: [birth year]  Subjective: [name] is a 66 y.o. year old female who is a primary care patient of [name], Beverly Halsted, MD. The care management team was consulted for assistance with disease management and care coordination needs.    Engaged with patient by telephone for follow up visit in response to provider referral for case management and/or care coordination services.   Consent to Services:  The patient was given information about Chronic Care Management services, agreed to services, and gave verbal consent prior to initiation of services.  Please see initial visit note for detailed documentation.   Patient agreed to services and verbal consent obtained.   Assessment: Review of patient past medical history, allergies, medications, health status, including review of consultants reports, laboratory and other test data, was performed as part of comprehensive evaluation and provision of chronic care management services.   SDOH (Social Determinants of Health) assessments and interventions performed:    CCM Care Plan  Allergies  Allergen Reactions   Mirtazapine Other (See Comments)    halucinations   Acetaminophen-Codeine Nausea And Vomiting   Aspirin Nausea And Vomiting    (can take coated aspirin)   Astelin [Azelastine]     Can not remember    Bactrim [Sulfamethoxazole-Trimethoprim]     Can not remember   Celecoxib Other (See Comments)    "does the opposite of what it is suppose to do"   Fentanyl     Can not remember   Levaquin [Levofloxacin In D5w] Itching   Lipitor [Atorvastatin] Other (See Comments)    Myalgias    Neomycin     Can not remember   Salicylates     Can not remember   Sulfa Antibiotics Nausea And Vomiting   Adhesive [Tape] Rash   Ciprofloxacin Rash   Latex Rash   Tegretol [Carbamazepine] Rash    Outpatient Encounter Medications as of  04/06/2021  Medication Sig   albuterol (PROAIR HFA) 108 (90 Base) MCG/ACT inhaler Inhale 2 puffs into the lungs every 4 (four) hours as needed for wheezing or shortness of breath (cough).   ALPRAZolam (XANAX) 0.5 MG tablet Take 1 tablet (0.5 mg total) by mouth 3 (three) times daily as needed for anxiety. for anxiety   aspirin EC 81 MG tablet Take 81 mg by mouth daily.   Budeson-Glycopyrrol-Formoterol (BREZTRI AEROSPHERE) 160-9-4.8 MCG/ACT AERO Inhale 2 puffs into the lungs in the morning and at bedtime.   DULoxetine (CYMBALTA) 60 MG capsule Take 1 capsule (60 mg total) by mouth 2 (two) times daily.   empagliflozin (JARDIANCE) 10 MG TABS tablet Take 1 tablet (10 mg total) by mouth daily.   ezetimibe (ZETIA) 10 MG tablet Take 10 mg by mouth daily.   levothyroxine (SYNTHROID) 50 MCG tablet TAKE 1 TABLET IN THE MORNING BEFORE BREAKFAST.   midodrine (PROAMATINE) 5 MG tablet TAKE ONE TABLET BY MOUTH 3 TIMES A DAY WITH MEALS   mirtazapine (REMERON) 15 MG tablet TAKE (1) TABLET BY MOUTH AT BEDTIME.   nicotine (NICODERM CQ - DOSED IN MG/24 HR) 7 mg/24hr patch Place 1 patch (7 mg total) onto the skin daily.   oxyCODONE (OXY IR/ROXICODONE) 5 MG immediate release tablet TAKE (1) TABLET BY MOUTH EVERY SIX HOURS AS NEEDED FOR SEVERE PAIN.   pantoprazole (PROTONIX) 40 MG tablet TAKE ONE TABLET BY MOUTH TWICE DAILY BEFORE A MEAL.   predniSONE (DELTASONE) 10 MG tablet Take  4 each am x 2 days,   2 each am x 2 days,  1 each am x 2 days and stop   pregabalin (LYRICA) 100 MG capsule Take 1 capsule (100 mg total) by mouth 3 (three) times daily.   QUEtiapine (SEROQUEL) 50 MG tablet Take 1-2 tablets (50-100 mg total) by mouth at bedtime. TAKE 1 OR 2 TABLETS BY MOUTH AT BEDTIME for sleep   spironolactone (ALDACTONE) 25 MG tablet Take 0.5 tablets (12.5 mg total) by mouth daily.   torsemide (DEMADEX) 20 MG tablet Take 1 tablet (20 mg total) by mouth daily as needed (As needed for swelling or weight gain).   No  facility-administered encounter medications on file as of 04/06/2021.    Patient Active Problem List   Diagnosis Date Noted   Adjustment disorder with mixed anxiety and depressed mood 03/05/2021   Abnormal stress test    Shortness of breath    Palliative care by specialist    Acute on chronic respiratory failure with hypoxia and hypercapnia ([place]) 07/26/2020   Acute on chronic combined systolic and diastolic CHF (congestive heart failure) ([place]) 07/26/2020   Idiopathic hypotension 10/08/2019   Encounter for long-term use of opiate analgesic 03/12/2019   IDA (iron deficiency anemia) 02/17/2019   Gastritis and gastroduodenitis 02/17/2019   COPD  GOLD II / still smoking  12/08/2018   Chronic respiratory failure with hypoxia and hypercapnia ([place]) 12/08/2018   Radiculopathy due to lumbar intervertebral disc disorder 10/29/2018   Sciatica of left side 10/29/2018   Anemia    Gastrointestinal hemorrhage    Elevated LFTs    Hypokalemia 08/24/2018   COPD exacerbation ([place]) 03/18/2014   Acute kidney injury ([place]) 03/18/2014   Acute respiratory failure with hypoxia ([place]) 03/18/2014   CAP (community acquired pneumonia) 03/17/2014   Pneumonia 03/17/2014   HTN (hypertension) 12/03/2012   Sepsis ([employer]) 02/21/2012   PNA (pneumonia) 02/21/2012   Adrenal insufficiency ([facility]) 02/21/2012   Subclavian steal syndrome 02/21/2012   Chronic pain 11/22/2010   Migraine headache 11/22/2010   Gastroesophageal reflux disease 11/22/2010   Goals of care, counseling/discussion 11/22/2010   Cerebrovascular disease 07/24/2010   Anxiety and depression    Hyperlipidemia    Hypothyroid    Cigarette smoker     Conditions to be addressed/monitored:CHF, HTN, HLD, COPD, Anxiety, and Depression  Care Plan : RN Care Manager Plan of Care  Updates made by Beverly Ped, RN since 04/06/2021 12:00 AM     Problem: Chronic Disease Management and Care Coordination Needs (COPD, HF,HTN, HLD, anxiety/depression)   Priority:  High     Long-Range Goal: Establish Plan of Care for Chronic Disease Management Needs (COPD, HF,HTN, HLD, anxiety/depression)   Start Date: 04/06/2021  Expected End Date: 04/06/2022  Priority: High  Note:   Current Barriers:  Knowledge Deficits related to plan of care for management of CHF, HTN, HLD, COPD, Anxiety, Depression, and Tobacco Use  Care Coordination needs related to ADL IADL limitations and Mental Health Concerns  Chronic Disease Management support and education needs related to CHF, HTN, HLD, COPD, Anxiety, and Depression  States her breathing has been good for her.  states her O2 sats range from 88 to the 90's.  States she is wearing her O2 at 3 1/2 to 4 liters.  States she is still working on getting a new portable O2 system and was told that she will qualify in 6 months. States she has dizziness at times.  States she has not been checking her weight  or B/P. States she usually takes her torsemide every other day not only as needed along with her spirolactone.  Denies any swelling, increased shortness of breath or chest pains.  States she has not called therapist that LCSW provided.  States she has noticed her vision in her rt eye is cloudy and if has been a long time since she had an eye exam. States she is smoking about 1/2 pack of cigarettes a day and does not wish to quit at this time  RNCM Clinical Goal(s):  Patient will verbalize understanding of plan for management of CHF, HTN, HLD, COPD, Anxiety, and Depression as evidenced by voiced adherence to plan of care verbalize basic understanding of  CHF, HTN, HLD, COPD, Anxiety, and Depression disease process and self health management plan as evidenced by voiced understanding and teach back take all medications exactly as prescribed and will call provider for medication related questions as evidenced by dispense report and pt verbalization  attend all scheduled medical appointments: Pulmonary 04/18/21, cardiology 04/26/21, CCM LCSW  05/03/21  as evidenced by medical records demonstrate Improved adherence to prescribed treatment plan for CHF, HTN, HLD, COPD, Anxiety, and Depression as evidenced by readings within limits, voiced adherence to plan of care work with pharmacist to address Financial constraints related to medications and Medication procurement related toCHF, HTN, HLD, COPD, Anxiety, and Depression as evidenced by review or EMR and patient or pharmacist report work with Education officer, museum to address  related to the management of ADL IADL limitations and Mental Health Concerns  related to the management of CHF, HTN, HLD, Anxiety, and Depression as evidenced by review of EMR and patient or Education officer, museum report through collaboration with Consulting civil engineer, provider, and care team.   Interventions: 1:1 collaboration with primary care provider regarding development and update of comprehensive plan of care as evidenced by provider attestation and co-signature Inter-disciplinary care team collaboration (see longitudinal plan of care) Evaluation of current treatment plan related to  self management and patient's adherence to plan as established by provider Communicated to provider patients request to have eye exam Reviewed information that LCSW gave pt to call therapist in [place]   Heart Failure Interventions:  (Status:  Goal on track:  Yes.) Long Term Goal Basic overview and discussion of pathophysiology of Heart Failure reviewed Provided education on low sodium diet Reviewed Heart Failure Action Plan in depth and provided written copy Advised patient to weigh each morning after emptying bladder Discussed importance of daily weight and advised patient to weigh and record daily Reviewed role of diuretics in prevention of fluid overload and management of heart failure; Discussed the importance of keeping all appointments with provider Reviewed that cardiology advised to take torsemide as needed for swelling and weight gain. Reviewed  to try to weigh daily and when to call provider [place]  calendar to pt  COPD Interventions:  (Status:  Goal on track:  Yes.) Long Term Goal Provided patient with basic written and verbal COPD education on self care/management/and exacerbation prevention Advised patient to track and manage COPD triggers Provided instruction about proper use of medications used for management of COPD including inhalers Advised patient to self assesses COPD action plan zone and make appointment with provider if in the yellow zone for 48 hours without improvement Advised patient to engage in light exercise as tolerated 3-5 days a week to aid in the the management of COPD Provided education about and advised patient to utilize infection prevention strategies to reduce  risk of respiratory infection Discussed the importance of adequate rest and management of fatigue with COPD Reviewed to take extra Oxygen tanks with her when she has appoinments   Hyperlipidemia Interventions:  (Status:  Goal on track:  Yes.) Long Term Goal Medication review performed; medication list updated in electronic medical record.  Counseled on importance of regular laboratory monitoring as prescribed Reviewed role and benefits of statin for ASCVD risk reduction Reviewed importance of limiting foods high in cholesterol  Hypertension Interventions:  (Status:  Goal on track:  Yes.) Long Term Goal Last practice recorded BP readings:  BP Readings from Last 3 Encounters:  03/05/21 102/68  02/21/21 96/64  01/30/21 (!) 93/37  Most recent eGFR/CrCl:  Lab Results  Component Value Date   EGFR 42 (L) 01/01/2021    No components found for: CRCL  Evaluation of current treatment plan related to hypertension self management and patient's adherence to plan as established by provider Provided education to patient re: stroke prevention, s/s of heart attack and stroke Discussed plans with patient for ongoing care management  follow up and provided patient with direct contact information for care management team Advised patient, providing education and rationale, to monitor blood pressure daily and record, calling PCP for findings outside established parameters Provided education on prescribed diet low sodium heart healthy Reviewed to take B/P regularly and when she feels dizzy  Patient Goals/Self-Care Activities: Take all medications as prescribed Attend all scheduled provider appointments Call pharmacy for medication refills 3-7 days in advance of running out of medications Call provider office for new concerns or questions  Work with the social worker to address care coordination needs and will continue to work with the clinical team to address health care and disease management related needs call office if I gain more than 2 pounds in one day or 5 pounds in one week keep legs up while sitting watch for swelling in feet, ankles and legs every day weigh myself daily follow rescue plan if symptoms flare-up eat more whole grains, fruits and vegetables, lean meats and healthy fats eliminate smoking in my home identify and remove indoor air pollutants limit outdoor activity during cold weather do breathing exercises every day eliminate symptom triggers at home follow rescue plan if symptoms flare-up get at least 7 to 8 hours of sleep at night practice relaxation or meditation daily check blood pressure 3 times per week choose a place to take my blood pressure (home, clinic or office, retail store) write blood pressure results in a log or diary take blood pressure log to all doctor appointments call doctor for signs and symptoms of high blood pressure call for medicine refill 2 or 3 days before it runs out take all medications exactly as prescribed call doctor with any symptoms you believe are related to your medicine  Follow Up Plan:  Telephone follow up appointment with care management team member  scheduled for:  05/10/21 The patient has been provided with contact information for the care management team and has been advised to call with any health related questions or concerns.       Plan:Telephone follow up appointment with care management team member scheduled for:  05/10/21 The patient has been provided with contact information for the care management team and has been advised to call with any health related questions or concerns.  Peter Garter RN, Jackquline Denmark, CDE Care Management Coordinator [facility] [phone]

## 2021-04-18 ENCOUNTER — Other Ambulatory Visit: Payer: Self-pay | Admitting: Physical Medicine and Rehabilitation

## 2021-04-18 ENCOUNTER — Other Ambulatory Visit: Payer: Self-pay | Admitting: Gastroenterology

## 2021-04-18 ENCOUNTER — Telehealth: Payer: Self-pay

## 2021-04-18 ENCOUNTER — Ambulatory Visit: Payer: Medicare Other | Admitting: Internal Medicine

## 2021-04-18 NOTE — Telephone Encounter (Signed)
Last office visit 02/17/19

## 2021-04-18 NOTE — Progress Notes (Deleted)
? ?[name], female    DOB: 1955/03/15      MRN: [id number] ? ? ?Brief patient profile:  ?60 yowf MM/active smoker with onset somewhat limited by both breathing and back/ legs x 2017  With GOLD II criteria by spirometry  07/17/15   rx with proair avg sev times a week at most no maint rx then acutely ill for a few days weakness > sob  before admitted [facility]  With new acute resp failure/ severe hypoxemia.  ?  ? ?History of Present Illness  ?12/08/2018  Pulmonary/ 1st office eval/[name]  GOLD II/ still smoking on symb 72 'now and then"  And 02 24/7  ?Chief Complaint  ?Patient presents with  ? Pulmonary Consult  ?  Referred by Dr Deniece Ree. Pt c/o SOB since July 2020. She states she was hospitalized with CHF then and started on o2 3lpm continuous o2.   ?Dyspnea:  Better  But can't walk the dog yet, better on 02 3lpm cont ?Cough: none  ?Sleep: no resp symptoms  flat  ?SABA use: less than usual / nebulizer twice weekly at most  ?0 2 3lpm 24/7 does not titrate though has pulse ox ?rec ?Plan A = Automatic = Always=   Symbicort 160 Take 2 puffs first thing in am and then another 2 puffs about 12 hours later.  ? Work on inhaler technique:   ?  Plan B = Backup (to supplement plan A, not to replace it) ?Only use your albuterol inhaler as a rescue medication to be used if you can't catch your breath   ? Plan C = Crisis (instead of Plan B but only if Plan B stops working) ?- only use your albuterol nebulizer if you first try Plan B and it fails to help > ok to use the nebulizer up to every 4 hours but if start needing it regularly call for immediate appointment ? Goal is to keep your 02 saturation above 90%  ? The key is to stop smoking completely before smoking completely stops you! ? ? ?07/21/2019  f/u ov/[name] re: GOLD II/ still smoking but cutting down/ unable to use hfa correctly  / had vaccines for COVID 19  ?Chief Complaint  ?Patient presents with  ? Follow-up  ?  Breathing has been "off and on decent" c/o feeling light  headed.   ? Dyspnea:  Walking dog around the outside of the house ?Cough: "it's from my drainage from allergies better with nasal spray/ mucoid  ?Sleeping: flat bed 2 pillows  ?SABA use:  Used saba am of ov neb but not the symbicort yet  ?02: 3lpm continuous ?rec ?Plan A = Automatic = Always=    Trelegy one click first thing each am - take two good deep drags and hold for at least 5 5 seconds then out thru the nose ?Plan B = Backup (to supplement plan A, not to replace it) ?Only use your albuterol PROAIR/red inhaler as a rescue medication  ?Plan C = Crisis (instead of Plan B but only if Plan B stops working) ?- only use your albuterol nebulizer if you first try Plan B and it fails to help > ok to use the nebulizer up to every 4 hours but if start needing it regularly call for immediate appointment ?Will ask adapt to do a best fit evaluation for your portable system. ?Make sure you check your oxygen saturations at highest level of activity to be sure it stays over 90%.  ?Please schedule a  follow up office visit in 6 weeks, call sooner if needed - bring all your inhalers and nebulizer solutions. ? ? ?09/01/2019  f/u ov/[name] re:  GOLD III copd / trelegy maint  ?Chief Complaint  ?Patient presents with  ? Follow-up  ?  shortness of breath with exertion  ?Dyspnea:  Struggles to walk dog 100 ft on 3lpm pulsed but not checking sats  ?Cough: no  ?Sleeping: bed is flat 1-2 pillows  ?SABA use: much less now  ?02: 3 hs and up to 3 lpm walking  ?rec ?We will see if we can qualify you for POC thru [facility]  ?Make sure you check your oxygen saturations at highest level of activity to be sure it stays over 90%  ?The key is to stop smoking completely before smoking completely stops you! ? ? ?12/06/2019  f/u ov/[name] re:  trelegy  GOLD III copd/ 02 dep  ?Chief Complaint  ?Patient presents with  ? Follow-up  ?  nasal congestion   ?Dyspnea:  750 feet to get mail  ?Cough: minimal am mucoid ?Sleeping one pillow bed flat  ?SABA  use: much less  ?02: 2lpm sitting  Usually 90s/ 3lpm at hs / not walking on 02 though  ?Rec ?The key is to stop smoking completely before smoking completely stops you! ?Make sure you check your oxygen saturations at highest level of activity  ? ? ? ?09/12/2020  f/u ov/[place] office/[name] re: GOLD III copd/ 02 dep  maint on breztri but not consistent  with rx  ?Chief Complaint  ?Patient presents with  ? Follow-up  ?  Breathing is overall doing well. She states she is using her proair inhaler about 2 x per wk.   ?Dyspnea:  has to stop several times to mb and back some hills  x 750 ft  ?Cough: none  ?Sleeping: bed is flat 2pillows  ?SABA use: 2 x weekly  ?02:  4lpm / no am ha  ?Covid status: vax x 4  ?Lung cancer screening: n/a   ?Rec ?Plan A = Automatic = Always=    Breztri Take 2 puffs first thing in am and then another 2 puffs about 12 hours later.  ? Work on inhaler technique:   ?Plan B = Backup (to supplement plan A, not to replace it) ?Only use your albuterol inhaler(PROAIR)  as a rescue medication  ?Make sure you check your oxygen saturation  at your highest level of activity  to be sure it stays over 90%  ? ? ? ?01/17/2021  f/u ov/[place] office/Balen Smith re: COPD GOLD 3/ 02 dep  maint on Breztri / still smoking  ?Chief Complaint  ?Patient presents with  ? Follow-up  ?  Lobby to room on room air O2 sats were 81%.  ?On 3LO2 cont. Sats increased to 91% after sitting.  ? ?Feels breathing has worsened since last OV. Hoping to get portable oxygen.   ?Dyspnea:  stopped mb and back 3 months prior to OV  /  pushing cart 2.5 cont stil able to [employer] but not checking sats as rec "afraid of using too much oxygen" ?Cough: mild cough/congestion but mucus is white  ?Sleeping: on side/ 2 pillows lots of am congestion brown tinged  ?SABA use: up to twice daily  ?02: 3.5 lpm conc at hs / 3.5 with during day at home but not titrating then using less when out of house from refillable tank ?Covid status: x 4  ?Rec ?We will adapt  to do a  best fit evaluation for portable oxygen  ?We will check you at rest and with walking at pace you use at grocery store  ?Work on inhaler technique:  ?Prednisone 10 mg take  4 each am x 2 days,   2 each am x 2 days,  1 each am x 2 days and stop  ?Zpak  ?Please remember to go to the x-ray department > COPD only ?Please schedule a follow up office visit in 6 weeks, call sooner if needed - bring your medications with you  ? ?04/18/2021  f/u ov/[place] office/Saryiah Bencosme re: GOLD 3 copd/ 02 dep  maint on ***  ?No chief complaint on file. ?  ?Dyspnea:  *** ?Cough: *** ?Sleeping: *** ?SABA use: *** ?02: *** ?Covid status: *** ?Lung cancer screening: *** ? ? ?No obvious day to day or daytime variability or assoc excess/ purulent sputum or mucus plugs or hemoptysis or cp or chest tightness, subjective wheeze or overt sinus or hb symptoms.  ? ?*** without nocturnal  or early am exacerbation  of respiratory  c/o's or need for noct saba. Also denies any obvious fluctuation of symptoms with weather or environmental changes or other aggravating or alleviating factors except as outlined above  ? ?No unusual exposure hx or h/o childhood pna/ asthma or knowledge of premature birth. ? ?Current Allergies, Complete Past Medical History, Past Surgical History, Family History, and Social History were reviewed in [employer] record. ? ?ROS  The following are not active complaints unless bolded ?Hoarseness, sore throat, dysphagia, dental problems, itching, sneezing,  nasal congestion or discharge of excess mucus or purulent secretions, ear ache,   fever, chills, sweats, unintended wt loss or wt gain, classically pleuritic or exertional cp,  orthopnea pnd or arm/hand swelling  or leg swelling, presyncope, palpitations, abdominal pain, anorexia, nausea, vomiting, diarrhea  or change in bowel habits or change in bladder habits, change in stools or change in urine, dysuria, hematuria,  rash, arthralgias, visual  complaints, headache, numbness, weakness or ataxia or problems with walking or coordination,  change in mood or  memory. ?      ? ?No outpatient medications have been marked as taking for the 04/18/21 encounter (Ap

## 2021-04-18 NOTE — Telephone Encounter (Signed)
Patient is calling for a refill on Oxycodone. Per PMP, last fill was 03/05/21 ?

## 2021-04-20 MED ORDER — OXYCODONE HCL 5 MG PO TABS: 5.0000 mg | ORAL_TABLET | Freq: Four times a day (QID) | ORAL | 0 refills | Status: DC | PRN

## 2021-04-20 NOTE — Addendum Note (Signed)
Addended by: Courtney Heys on: 04/20/2021 10:32 AM ? ? Modules accepted: Orders ? ?

## 2021-04-23 ENCOUNTER — Telehealth: Payer: Self-pay | Admitting: Pharmacist

## 2021-04-23 NOTE — Chronic Care Management (AMB) (Signed)
? ? ?Chronic Care Management ?Pharmacy Assistant  ? ?Name: [name]  MRN: [id number] DOB: 05/01/1955 ? ?Reason for Encounter: Offer to reschedule missed appointment with Jeni Salles Clinical Pharmacist ?  ?Recent office visits:  ?04/06/21 Dimitri Ped, RN - Patient presented for CCM Nurse Visit ? ?03/23/21 Rebekah Chesterfield, LCSW - Patient presented for [facility] Social Worker visit ? ?03/09/21 Dimitri Ped, RN - Patient presented for CCM Nurse Visit ? ?Recent consult visits:  ?03/19/21 Senor, Claudean Kinds, RN - Palliative care home visit. No medication changes. ? ?03/05/21 Courtney Heys, MD (Phys Med) - Patient presented for Radiculopathy die to lumbar intervertebral disc disorder and other concerns. Changed Alprazolam, Pregabalin & Quetiapine ? ?02/21/21 Erma Heritage, PA-C (Cardiology) - Patient presented for Chronic systolic heart failure and other concerns. Prescribed Spironolactone. Changed Torsemide. ? ?Hospital visits:  ?Patient presented to [facility] on  01/30/21 due to RIGHT/LEFT HEART CATH AND CORONARY ANGIOGRAPHY. Patient was present for 8 hours. ?  ?New?Medications Started at [facility] Discharge:?? ?-started  ?None ?  ?Medication Changes at Hospital Discharge: ?-Changed  ?None ?  ?Medications Discontinued at Hospital Discharge: ?-Stopped  ?azithromycin 250 MG tablet ?  ?Medications that remain the same after Hospital Discharge:??  ?-All other medications will remain the same.   ?  ? ?Medications: ?Outpatient Encounter Medications as of 04/23/2021  ?Medication Sig  ? albuterol (PROAIR HFA) 108 (90 Base) MCG/ACT inhaler Inhale 2 puffs into the lungs every 4 (four) hours as needed for wheezing or shortness of breath (cough).  ? ALPRAZolam (XANAX) 0.5 MG tablet Take 1 tablet (0.5 mg total) by mouth 3 (three) times daily as needed for anxiety. for anxiety  ? aspirin EC 81 MG tablet Take 81 mg by mouth daily.  ? Budeson-Glycopyrrol-Formoterol (BREZTRI AEROSPHERE) 160-9-4.8 MCG/ACT  AERO Inhale 2 puffs into the lungs in the morning and at bedtime.  ? DULoxetine (CYMBALTA) 60 MG capsule Take 1 capsule (60 mg total) by mouth 2 (two) times daily.  ? empagliflozin (JARDIANCE) 10 MG TABS tablet Take 1 tablet (10 mg total) by mouth daily.  ? ezetimibe (ZETIA) 10 MG tablet Take 10 mg by mouth daily.  ? levothyroxine (SYNTHROID) 50 MCG tablet TAKE 1 TABLET IN THE MORNING BEFORE BREAKFAST.  ? midodrine (PROAMATINE) 5 MG tablet TAKE ONE TABLET BY MOUTH 3 TIMES A DAY WITH MEALS  ? mirtazapine (REMERON) 15 MG tablet TAKE (1) TABLET BY MOUTH AT BEDTIME.  ? nicotine (NICODERM CQ - DOSED IN MG/24 HR) 7 mg/24hr patch Place 1 patch (7 mg total) onto the skin daily.  ? oxyCODONE (OXY IR/ROXICODONE) 5 MG immediate release tablet Take 1 tablet (5 mg total) by mouth every 6 (six) hours as needed for severe pain.  ? pantoprazole (PROTONIX) 40 MG tablet TAKE ONE TABLET BY MOUTH TWICE DAILY BEFORE A MEAL.  ? predniSONE (DELTASONE) 10 MG tablet Take  4 each am x 2 days,   2 each am x 2 days,  1 each am x 2 days and stop  ? pregabalin (LYRICA) 100 MG capsule Take 1 capsule (100 mg total) by mouth 3 (three) times daily.  ? QUEtiapine (SEROQUEL) 50 MG tablet Take 1-2 tablets (50-100 mg total) by mouth at bedtime. TAKE 1 OR 2 TABLETS BY MOUTH AT BEDTIME for sleep  ? spironolactone (ALDACTONE) 25 MG tablet Take 0.5 tablets (12.5 mg total) by mouth daily.  ? torsemide (DEMADEX) 20 MG tablet Take 1 tablet (20 mg total) by mouth daily as  needed (As needed for swelling or weight gain).  ? ?No facility-administered encounter medications on file as of 04/23/2021.  ?Notes: ?Call to patient to offer to reschedule missed appointment with Jeni Salles Clinical Pharmacist. Did not reach patient left voicemail with my contact information for return call. ? ?Care Gaps: ?Zoster Vaccine - Overdue ?PAP Smear - Overdue ?Mammogram - Overdue ?BP- 102/68 (03/05/21) ?AWV- AWV - office aware to schedule as of 11/22 ? ? ?Star Rating  Drugs: ?Empagliflozin (Jardiance ) 10 mg - Last filled 2/182023 30 DS At [facility] ?  ?Patient Assistance: ?Breztri ([place] &[place])- Approved for 2023  ? ? ? ?Ned Clines CMA ?Clinical Pharmacist Assistant ?[phone] ? ?

## 2021-04-25 ENCOUNTER — Other Ambulatory Visit: Payer: Self-pay

## 2021-04-25 ENCOUNTER — Encounter: Payer: Self-pay | Admitting: Internal Medicine

## 2021-04-25 ENCOUNTER — Ambulatory Visit: Payer: Medicare Other | Admitting: Internal Medicine

## 2021-04-25 DIAGNOSIS — F17210 Nicotine dependence, cigarettes, uncomplicated: Secondary | ICD-10-CM | POA: Diagnosis not present

## 2021-04-25 DIAGNOSIS — J449 Chronic obstructive pulmonary disease, unspecified: Secondary | ICD-10-CM | POA: Diagnosis not present

## 2021-04-25 DIAGNOSIS — J9611 Chronic respiratory failure with hypoxia: Secondary | ICD-10-CM | POA: Diagnosis not present

## 2021-04-25 DIAGNOSIS — J9612 Chronic respiratory failure with hypercapnia: Secondary | ICD-10-CM

## 2021-04-25 NOTE — Patient Instructions (Signed)
Make sure you check your oxygen saturation  AT  your highest level of activity (not after you stop)   to be sure it stays over 90% and adjust  02 flow upward to maintain this level if needed but remember to turn it back to previous settings when you stop (to conserve your supply).  ? ? ?Work on inhaler technique:  relax and gently blow all the way out then take a nice smooth full deep breath back in, triggering the inhaler at same time you start breathing in.  Hold for up to 5 seconds if you can. Blow out thru nose. Rinse and gargle with water when done.  If mouth or throat bother you at all,  try brushing teeth/gums/tongue with arm and hammer toothpaste/ make a slurry and gargle and spit out.  ? ?We will refer to adapt for best fit for portable 02  ? ?Please schedule a follow up visit in 6 months but call sooner if needed  ? ?   ?

## 2021-04-25 NOTE — Progress Notes (Signed)
? ?[name], female    DOB: 12/05/1955      MRN: [id number] ? ? ?Brief patient profile:  ?89 yowf MM/active smoker with onset somewhat limited by both breathing and back/ legs x 2017  With GOLD II criteria by spirometry  07/17/15   rx with proair avg sev times a week at most no maint rx then acutely ill for a few days weakness > sob  before admitted [facility]  With new acute resp failure/ severe hypoxemia.  ?  ? ?History of Present Illness  ?12/08/2018  Pulmonary/ 1st Smith eval/[name]  GOLD II/ still smoking on symb 28 'now and then"  And 02 24/7  ?Chief Complaint  ?Patient presents with  ? Pulmonary Consult  ?  Referred by Dr Deniece Ree. Pt c/o SOB since July 2020. She states she was hospitalized with CHF then and started on o2 3lpm continuous o2.   ?Dyspnea:  Better  But can't walk the dog yet, better on 02 3lpm cont ?Cough: none  ?Sleep: no resp symptoms  flat  ?SABA use: less than usual / nebulizer twice weekly at most  ?0 2 3lpm 24/7 does not titrate though has pulse ox ?rec ?Plan A = Automatic = Always=   Symbicort 160 Take 2 puffs first thing in am and then another 2 puffs about 12 hours later.  ? Work on inhaler technique:   ?  Plan B = Backup (to supplement plan A, not to replace it) ?Only use your albuterol inhaler as a rescue medication to be used if you can't catch your breath   ? Plan C = Crisis (instead of Plan B but only if Plan B stops working) ?- only use your albuterol nebulizer if you first try Plan B and it fails to help > ok to use the nebulizer up to every 4 hours but if start needing it regularly call for immediate appointment ? Goal is to keep your 02 saturation above 90%  ? The key is to stop smoking completely before smoking completely stops you! ? ? ?07/21/2019  f/u ov/[name] re: GOLD II/ still smoking but cutting down/ unable to use hfa correctly  / had vaccines for COVID 19  ?Chief Complaint  ?Patient presents with  ? Follow-up  ?  Breathing has been "off and on decent" c/o feeling light  headed.   ? Dyspnea:  Walking dog around the outside of the house ?Cough: "it's from my drainage from allergies better with nasal spray/ mucoid  ?Sleeping: flat bed 2 pillows  ?SABA use:  Used saba am of ov neb but not the symbicort yet  ?02: 3lpm continuous ?rec ?Plan A = Automatic = Always=    Trelegy one click first thing each am - take two good deep drags and hold for at least 5 5 seconds then out thru the nose ?Plan B = Backup (to supplement plan A, not to replace it) ?Only use your albuterol PROAIR/red inhaler as a rescue medication  ?Plan C = Crisis (instead of Plan B but only if Plan B stops working) ?- only use your albuterol nebulizer if you first try Plan B and it fails to help > ok to use the nebulizer up to every 4 hours but if start needing it regularly call for immediate appointment ?Will ask adapt to do a best fit evaluation for your portable system. ?Make sure you check your oxygen saturations at highest level of activity to be sure it stays over 90%.  ?Please schedule a  follow up Smith visit in 6 weeks, call sooner if needed - bring all your inhalers and nebulizer solutions. ? ? ?09/01/2019  f/u ov/[name] re:  GOLD III copd / trelegy maint  ?Chief Complaint  ?Patient presents with  ? Follow-up  ?  shortness of breath with exertion  ?Dyspnea:  Struggles to walk dog 100 ft on 3lpm pulsed but not checking sats  ?Cough: no  ?Sleeping: bed is flat 1-2 pillows  ?SABA use: much less now  ?02: 3 hs and up to 3 lpm walking  ?rec ?We will see if we can qualify you for POC thru [facility]  ?Make sure you check your oxygen saturations at highest level of activity to be sure it stays over 90%  ?The key is to stop smoking completely before smoking completely stops you! ?  ? ? ? ?09/12/2020  f/u ov/[name]/[name] re: GOLD III copd/ 02 dep  maint on breztri but not consistent  with rx  ?Chief Complaint  ?Patient presents with  ? Follow-up  ?  Breathing is overall doing well. She states she is using  her proair inhaler about 2 x per wk.   ?Dyspnea:  has to stop several times to mb and back some hills  x 750 ft  ?Cough: none  ?Sleeping: bed is flat 2pillows  ?SABA use: 2 x weekly  ?02:  4lpm / no am ha  ?Covid status: vax x 4  ?Lung cancer screening: n/a   ?Rec ?Plan A = Automatic = Always=    Breztri Take 2 puffs first thing in am and then another 2 puffs about 12 hours later.  ? Work on inhaler technique:   ?Plan B = Backup (to supplement plan A, not to replace it) ?Only use your albuterol inhaler(PROAIR)  as a rescue medication  ?Make sure you check your oxygen saturation  at your highest level of activity  to be sure it stays over 90%  ? ? ?  ? ? ?04/25/2021  f/u ov/ Smith/[name] re: GOLD 3 copd/ 02 dep  maint on breztri / still smoking ?Chief Complaint  ?Patient presents with  ? Follow-up  ?  Feels breathing has improved.   ? Dyspnea:  mb s stopping usually with sats 88% and above s 02 and wants to qualify for POC  ?Cough: none  ?Sleeping: bed is flat / one pillow  ?SABA use: none  ?02: 3lpm  ?Covid status: vax x 3  ?  ? ? ?No obvious day to day or daytime variability or assoc excess/ purulent sputum or mucus plugs or hemoptysis or cp or chest tightness, subjective wheeze or overt sinus or hb symptoms.  ? ?Sleeping as above without nocturnal  or early am exacerbation  of respiratory  c/o's or need for noct saba. Also denies any obvious fluctuation of symptoms with weather or environmental changes or other aggravating or alleviating factors except as outlined above  ? ?No unusual exposure hx or h/o childhood pna/ asthma or knowledge of premature birth. ? ?Current Allergies, Complete Past Medical History, Past Surgical History, Family History, and Social History were reviewed in [employer] record. ? ?ROS  The following are not active complaints unless bolded ?Hoarseness, sore throat, dysphagia, dental problems, itching, sneezing,  nasal congestion or discharge of excess mucus or  purulent secretions, ear ache,   fever, chills, sweats, unintended wt loss or wt gain, classically pleuritic or exertional cp,  orthopnea pnd or arm/hand swelling  or leg swelling, presyncope,  palpitations, abdominal pain, anorexia, nausea, vomiting, diarrhea  or change in bowel habits or change in bladder habits, change in stools or change in urine, dysuria, hematuria,  rash, arthralgias, visual complaints, headache, numbness, weakness or ataxia or problems with walking or coordination,  change in mood or  memory. ?      ? ?Current Meds  ?Medication Sig  ? albuterol (PROAIR HFA) 108 (90 Base) MCG/ACT inhaler Inhale 2 puffs into the lungs every 4 (four) hours as needed for wheezing or shortness of breath (cough).  ? ALPRAZolam (XANAX) 0.5 MG tablet Take 1 tablet (0.5 mg total) by mouth 3 (three) times daily as needed for anxiety. for anxiety  ? aspirin EC 81 MG tablet Take 81 mg by mouth daily.  ? Budeson-Glycopyrrol-Formoterol (BREZTRI AEROSPHERE) 160-9-4.8 MCG/ACT AERO Inhale 2 puffs into the lungs in the morning and at bedtime.  ? DULoxetine (CYMBALTA) 60 MG capsule Take 1 capsule (60 mg total) by mouth 2 (two) times daily.  ? empagliflozin (JARDIANCE) 10 MG TABS tablet Take 1 tablet (10 mg total) by mouth daily.  ? ezetimibe (ZETIA) 10 MG tablet Take 10 mg by mouth daily.  ? levothyroxine (SYNTHROID) 50 MCG tablet TAKE 1 TABLET IN THE MORNING BEFORE BREAKFAST.  ? midodrine (PROAMATINE) 5 MG tablet TAKE ONE TABLET BY MOUTH 3 TIMES A DAY WITH MEALS  ? mirtazapine (REMERON) 15 MG tablet TAKE (1) TABLET BY MOUTH AT BEDTIME.  ? nicotine (NICODERM CQ - DOSED IN MG/24 HR) 7 mg/24hr patch Place 1 patch (7 mg total) onto the skin daily.  ? oxyCODONE (OXY IR/ROXICODONE) 5 MG immediate release tablet Take 1 tablet (5 mg total) by mouth every 6 (six) hours as needed for severe pain.  ? pantoprazole (PROTONIX) 40 MG tablet TAKE ONE TABLET BY MOUTH TWICE DAILY BEFORE A MEAL.  ? predniSONE (DELTASONE) 10 MG tablet Take  4 each  am x 2 days,   2 each am x 2 days,  1 each am x 2 days and stop  ? pregabalin (LYRICA) 100 MG capsule Take 1 capsule (100 mg total) by mouth 3 (three) times daily.  ? QUEtiapine (SEROQUEL) 50 MG tablet Ta

## 2021-04-25 NOTE — Progress Notes (Signed)
? ?Cardiology Office Note   ? ?Date:  04/26/2021  ? ?ID:  [name], DOB 03/21/1955, MRN [id number] ? ?PCP:  [name], [name], MD  ?Cardiologist: Rozann Lesches, MD   ? ?Chief Complaint  ?Patient presents with  ? Follow-up  ?  2 month visit  ? ? ?History of Present Illness:   ? ?[name] is a 66 y.o. female with past medical history of HFrEF (EF 30-35% in 07/2020 and similar by repeat imaging in 11/2020), normal cors by cath in 2015 and catheterization 01/2021 showing only 25% proximal LCx stenosis, chronic LBBB, COPD, HLD, hypothyroidism, anxiety, chronic pain syndrome, left subclavian stenosis and tobacco use who presents to the office today for 40-monthfollow-up. ? ?She was last examined by myself in 02/2021 for follow-up from her recent cardiac catheterization and reported still having baseline dyspnea on exertion and was on supplemental oxygen 24/7. She denied any recent chest pain or palpitations. Given her hypotension, medical therapy was limited and she was continued on Jardiance 10 mg daily and Torsemide 20 mg daily was adjusted to as needed dosing and she was started on Spironolactone 12.5 mg daily. She was not a candidate for beta-blocker therapy given her heart rate in the 50's. It was recommended to plan for a follow-up echocardiogram in several months and if her EF remained reduced, would recommend referral to EP for consideration of CRT given her known LBBB. ? ?In talking with the patient today, she reports having intermittent shaking along her upper extremities for several months and this is typically worse after taking her medications. She is hopeful these might can be reduced at her upcoming visits with her PCP and Physical Medicine and Rehabilitation provider (will send them a message today). She reports her breathing has been stable but she is still on 3L [place] at baseline. No reported orthopnea, PND or pitting edema. SBP has been in the low-100's when checked at home.  She denies any recent chest pain or palpitations.  ? ? ?Past Medical History:  ?Diagnosis Date  ? Anemia   ? Anxiety and depression   ? Bronchitis   ? CHF (congestive heart failure) ([facility]   ? a. EF 30-35% in 07/2020 and similar by repeat imaging in 11/2020  ? Chronic pain   ? COPD (chronic obstructive pulmonary disease) ([facility]   ? Gastroesophageal reflux disease   ? History of cardiac catheterization   ? a. Normal coronary arteries 2015 b. cath in 01/2021 showing only 25% proximal LCx stenosis  ? Hyperlipidemia   ? Hypertension   ? Hypothyroid   ? Migraine headache   ? Near syncope   ? On home oxygen therapy   ? Started 08/2018  ? Peripheral vascular disease ([facility]   ? Pneumonia   ? Secondary cardiomyopathy ([facility]   ? Tobacco abuse   ? 1/2 pack per day  ? Uterine cancer ([facility]   ? ? ?Past Surgical History:  ?Procedure Laterality Date  ? ABDOMINAL HYSTERECTOMY    ? without oophorectomy for neoplastic disease  ? BACK SURGERY    ? BIOPSY  10/22/2018  ? Procedure: BIOPSY;  Surgeon: FDanie Binder MD;  [facility];  Service: Endoscopy;;  duodenal, gastric  ? CARDIAC CATHETERIZATION  07/2013  ? normal coronary arteries  ? CARPAL TUNNEL RELEASE    ? Left  ? CHOLECYSTECTOMY  2007  ? DILATION AND CURETTAGE OF UTERUS    ? ESOPHAGOGASTRODUODENOSCOPY (EGD) WITH PROPOFOL N/A 10/22/2018  ? gastritis (  mild reactive gastropathy, negative duodenal biopsies for celiac).   ? LAPAROTOMY    ? LEFT HEART CATHETERIZATION WITH CORONARY ANGIOGRAM N/A 07/12/2013  ? Procedure: LEFT HEART CATHETERIZATION WITH CORONARY ANGIOGRAM;  Surgeon: Leonie Man, MD;  Location: [facility] CATH LAB;  Service: Cardiovascular;  Laterality: N/A;  ? LUMBAR LAMINECTOMY  x3  ? Left; complicated by neurologic dysfunction  ? NASAL SINUS SURGERY    ? RIGHT/LEFT HEART CATH AND CORONARY ANGIOGRAPHY N/A 01/30/2021  ? Procedure: RIGHT/LEFT HEART CATH AND CORONARY ANGIOGRAPHY;  Surgeon: Jettie Booze, MD;  Location: [place] [facility];  Service: Cardiovascular;   Laterality: N/A;  ? ? ?Current Medications: ?Outpatient Medications Prior to Visit  ?Medication Sig Dispense Refill  ? albuterol (PROAIR HFA) 108 (90 Base) MCG/ACT inhaler Inhale 2 puffs into the lungs every 4 (four) hours as needed for wheezing or shortness of breath (cough). 18 g 11  ? ALPRAZolam (XANAX) 0.5 MG tablet Take 1 tablet (0.5 mg total) by mouth 3 (three) times daily as needed for anxiety. for anxiety 90 tablet 5  ? aspirin EC 81 MG tablet Take 81 mg by mouth daily.    ? Budeson-Glycopyrrol-Formoterol (BREZTRI AEROSPHERE) 160-9-4.8 MCG/ACT AERO Inhale 2 puffs into the lungs in the morning and at bedtime. 10.7 g 0  ? DULoxetine (CYMBALTA) 60 MG capsule Take 1 capsule (60 mg total) by mouth 2 (two) times daily. 60 capsule 5  ? empagliflozin (JARDIANCE) 10 MG TABS tablet Take 1 tablet (10 mg total) by mouth daily. 90 tablet 3  ? ezetimibe (ZETIA) 10 MG tablet Take 10 mg by mouth daily.    ? levothyroxine (SYNTHROID) 50 MCG tablet TAKE 1 TABLET IN THE MORNING BEFORE BREAKFAST. 30 tablet 11  ? midodrine (PROAMATINE) 5 MG tablet TAKE ONE TABLET BY MOUTH 3 TIMES A DAY WITH MEALS 90 tablet 5  ? mirtazapine (REMERON) 15 MG tablet TAKE (1) TABLET BY MOUTH AT BEDTIME. 30 tablet 5  ? nicotine (NICODERM CQ - DOSED IN MG/24 HR) 7 mg/24hr patch Place 1 patch (7 mg total) onto the skin daily. 28 patch 2  ? oxyCODONE (OXY IR/ROXICODONE) 5 MG immediate release tablet Take 1 tablet (5 mg total) by mouth every 6 (six) hours as needed for severe pain. 120 tablet 0  ? pantoprazole (PROTONIX) 40 MG tablet TAKE ONE TABLET BY MOUTH TWICE DAILY BEFORE A MEAL. 180 tablet 0  ? predniSONE (DELTASONE) 10 MG tablet Take  4 each am x 2 days,   2 each am x 2 days,  1 each am x 2 days and stop 14 tablet 0  ? pregabalin (LYRICA) 100 MG capsule Take 1 capsule (100 mg total) by mouth 3 (three) times daily. 90 capsule 5  ? QUEtiapine (SEROQUEL) 50 MG tablet Take 1-2 tablets (50-100 mg total) by mouth at bedtime. TAKE 1 OR 2 TABLETS BY MOUTH  AT BEDTIME for sleep 60 tablet 5  ? spironolactone (ALDACTONE) 25 MG tablet Take 0.5 tablets (12.5 mg total) by mouth daily. 45 tablet 3  ? torsemide (DEMADEX) 20 MG tablet Take 1 tablet (20 mg total) by mouth daily as needed (As needed for swelling or weight gain). 90 tablet 3  ? ?No facility-administered medications prior to visit.  ?  ? ?Allergies:   Mirtazapine, Acetaminophen-codeine, Aspirin, Astelin [azelastine], Bactrim [sulfamethoxazole-trimethoprim], Celecoxib, Fentanyl, Levaquin [levofloxacin in d5w], Lipitor [atorvastatin], Neomycin, Salicylates, Sulfa antibiotics, Adhesive [tape], Ciprofloxacin, Latex, and Tegretol [carbamazepine]  ? ?Social History  ? ?Socioeconomic History  ? Marital status: Married  ?  Spouse name: Charmelle Soh  ? Number of children: 2  ? Years of education: 49  ? Highest education level: 12th grade  ?Occupational History  ?  Comment: Disabled  ?Tobacco Use  ? Smoking status: Former  ?  Packs/day: 0.25  ?  Years: 40.00  ?  Pack years: 10.00  ?  Types: Cigarettes  ?  Start date: 09/06/1972  ?  Quit date: 04/12/2021  ?  Years since quitting: 0.0  ?  Passive exposure: Current  ? Smokeless tobacco: Never  ? Tobacco comments:  ?  Patient has 1-2 cigs a day and trying to quit.   ?Vaping Use  ? Vaping Use: Never used  ?Substance and Sexual Activity  ? Alcohol use: No  ?  Alcohol/week: 0.0 standard drinks  ? Drug use: No  ? Sexual activity: Not Currently  ?  Birth control/protection: Surgical  ?Other Topics Concern  ? Not on file  ?Social History Narrative  ? Not on file  ? ?Social Determinants of Health  ? ?Financial Resource Strain: Low Risk   ? Difficulty of Paying Living Expenses: Not very hard  ?Food Insecurity: No Food Insecurity  ? Worried About Charity fundraiser in the Last Year: Never true  ? Ran Out of Food in the Last Year: Never true  ?Transportation Needs: No Transportation Needs  ? Lack of Transportation (Medical): No  ? Lack of Transportation (Non-Medical): No  ?Physical  Activity: Inactive  ? Days of Exercise per Week: 0 days  ? Minutes of Exercise per Session: 0 min  ?Stress: No Stress Concern Present  ? Feeling of Stress : Only a little  ?Social Connections: Socially Integ

## 2021-04-26 ENCOUNTER — Ambulatory Visit (INDEPENDENT_AMBULATORY_CARE_PROVIDER_SITE_OTHER): Payer: Medicare Other | Admitting: Student

## 2021-04-26 ENCOUNTER — Encounter: Payer: Self-pay | Admitting: Student

## 2021-04-26 ENCOUNTER — Encounter: Payer: Self-pay | Admitting: Internal Medicine

## 2021-04-26 VITALS — BP 132/74 | HR 84 | Ht 62.0 in | Wt 119.0 lb

## 2021-04-26 DIAGNOSIS — J449 Chronic obstructive pulmonary disease, unspecified: Secondary | ICD-10-CM

## 2021-04-26 DIAGNOSIS — E785 Hyperlipidemia, unspecified: Secondary | ICD-10-CM | POA: Diagnosis not present

## 2021-04-26 DIAGNOSIS — I5022 Chronic systolic (congestive) heart failure: Secondary | ICD-10-CM | POA: Diagnosis not present

## 2021-04-26 DIAGNOSIS — J441 Chronic obstructive pulmonary disease with (acute) exacerbation: Secondary | ICD-10-CM | POA: Diagnosis not present

## 2021-04-26 DIAGNOSIS — I951 Orthostatic hypotension: Secondary | ICD-10-CM

## 2021-04-26 NOTE — Assessment & Plan Note (Signed)
D/c from [facility] 08/27/18 with HC03 30 on 3lpm  ?12/08/2018  Patient Saturations on Room Air at Rest = 94%, while Ambulating = 87% ?Patient Saturations on 3  Liters of pulsed oxygen while Ambulating = 95% ?-  12/06/2019   Required 4lpm pulsed to walk 250 ft moderate pace with sats 92% at end and denied sob but desaturated walking on 3lpm pulsed.  ?- 01/17/2021  Resting sats   Walked on 3lpm cont   x  3  lap(s) =  approx 450  ft  @ slow pace, stopped due to light headed > sob with lowest 02 sats 90%  ? ?Referred to [employer] for best fit but if sats only 90% at rest on 3lpm doubt she'll qualify for POC ?

## 2021-04-26 NOTE — Patient Instructions (Signed)
Medication Instructions:  ?Your physician recommends that you continue on your current medications as directed. Please refer to the Current Medication list given to you today. ? ?*If you need a refill on your cardiac medications before your next appointment, please call your pharmacy* ? ? ?Lab Work: ?NONE  ? ?If you have labs (blood work) drawn today and your tests are completely normal, you will receive your results only by: ?MyChart Message (if you have MyChart) OR ?A paper copy in the mail ?If you have any lab test that is abnormal or we need to change your treatment, we will call you to review the results. ? ? ?Testing/Procedures: ?Your physician has requested that you have an echocardiogram. Echocardiography is a painless test that uses sound waves to create images of your heart. It provides your doctor with information about the size and shape of your heart and how well your heart?s chambers and valves are working. This procedure takes approximately one hour. There are no restrictions for this procedure. ? ? ? ?Follow-Up: ?At [facility], you and your health needs are our priority.  As part of our continuing mission to provide you with exceptional heart care, we have created designated Provider Care Teams.  These Care Teams include your primary Cardiologist (physician) and Advanced Practice Providers (APPs -  Physician Assistants and Nurse Practitioners) who all work together to provide you with the care you need, when you need it. ? ?We recommend signing up for the patient portal called "MyChart".  Sign up information is provided on this After Visit Summary.  MyChart is used to connect with patients for Virtual Visits (Telemedicine).  Patients are able to view lab/test results, encounter notes, upcoming appointments, etc.  Non-urgent messages can be sent to your provider as well.   ?To learn more about what you can do with MyChart, go to [email or web address].   ? ?Your next appointment:   ?3 -4  month(s) ? ?The format for your next appointment:   ?In Person ? ?Provider:   ?Rozann Lesches, MD  ? ? ?Other Instructions ?Thank you for choosing [facility]! ? ? ? ?

## 2021-04-26 NOTE — Assessment & Plan Note (Signed)
Counseled re importance of smoking cessation but did not meet time criteria for separate billing   ? ?    ?  ? ?Each maintenance medication was reviewed in detail including emphasizing most importantly the difference between maintenance and prns and under what circumstances the prns are to be triggered using an action plan format where appropriate. ? ?Total time for H and P, chart review, counseling, reviewing hfa/02 device(s) and generating customized AVS unique to this office visit / same day charting > 30 min  ?     ?

## 2021-04-26 NOTE — Assessment & Plan Note (Signed)
Active smoker ?- Spirometry 07/17/15   FEV1 1.28 (53%)  Ratio 0.58 classic concave contour to f/v loop  ?- 12/08/2018   try increase symb to 80 2bid   ?-  Alpha one AT screen 12/08/2018  MM   Level 159  ?- 07/21/2019  After extensive coaching inhaler device,  effectiveness =    0% vs 75% with elipta so try change to trelegy if insurance covers ?- PFT's  08/23/20   FEV1 0.54 (23 % ) ratio 0.48  p 0 % improvement from saba p saba > 4 h prior to study with DLCO  6.84 (36%) corrects to 3.47 (80 %)  for alv volume and FV curve classic concave   ?- 04/25/2021  After extensive coaching inhaler device,  effectiveness =    75% from baseline of 50%  ? ? Group D in terms of symptom/risk and laba/lama/ICS  therefore appropriate rx at this point >>>  Continue breztri and approp saba  ? ?  ?

## 2021-05-03 ENCOUNTER — Telehealth: Payer: Self-pay | Admitting: Licensed Clinical Social Worker

## 2021-05-03 ENCOUNTER — Telehealth: Payer: Medicare Other

## 2021-05-03 NOTE — Chronic Care Management (AMB) (Signed)
? ?   Clinical Social Work  ?Care Management  ? Phone Outreach  ? ? ?05/03/2021 ?Name: [name] MRN: [id number] DOB: 15-Mar-1955 ? ?[name] is a 66 y.o. year old female who is a primary care patient of [name], [name], MD .  ? ?Reason for referral: Mental Health Counseling and Resources.   ? ?Two attempts made: ?F/U phone call today to assess needs, progress and barriers with care plan goals.   Telephone outreach was unsuccessful. A HIPPA compliant phone message was left for the patient providing contact information and requesting a return call.  ? ?Plan:Will route chart to Care Guide to see if patient's caregiver would like to reschedule phone appointment  ? ?Review of patient status, including review of consultants reports, relevant laboratory and other test results, and collaboration with appropriate care team members and the patient's provider was performed as part of comprehensive patient evaluation and provision of care management services.   ? ? ?Casimer Lanius, LCSW ?Licensed Clinical Social Worker [facility]  ?CCM LCSW Coverage for Christa See, LCSW ?[phone]  ?  ? ? ?  ?

## 2021-05-04 ENCOUNTER — Telehealth: Payer: Self-pay | Admitting: *Deleted

## 2021-05-04 DIAGNOSIS — F419 Anxiety disorder, unspecified: Secondary | ICD-10-CM

## 2021-05-04 DIAGNOSIS — I10 Essential (primary) hypertension: Secondary | ICD-10-CM | POA: Diagnosis not present

## 2021-05-04 DIAGNOSIS — J449 Chronic obstructive pulmonary disease, unspecified: Secondary | ICD-10-CM | POA: Diagnosis not present

## 2021-05-04 DIAGNOSIS — I5043 Acute on chronic combined systolic (congestive) and diastolic (congestive) heart failure: Secondary | ICD-10-CM

## 2021-05-04 DIAGNOSIS — F32A Depression, unspecified: Secondary | ICD-10-CM | POA: Diagnosis not present

## 2021-05-04 DIAGNOSIS — E782 Mixed hyperlipidemia: Secondary | ICD-10-CM | POA: Diagnosis not present

## 2021-05-04 NOTE — Chronic Care Management (AMB) (Signed)
?  Care Management  ? ?Note ? ?05/04/2021 ?Name: Shayla Heming MRN: [id number] DOB: 09-03-1955 ? ?Danitra Payano is a 66 y.o. year old female who is a primary care patient of [name], [name], MD and is actively engaged with the care management team. I reached out to [name] by phone today to assist with re-scheduling a follow up visit with the RN Case Manager ? ?Follow up plan: ?Unsuccessful telephone outreach attempt made. A HIPAA compliant phone message was left for the patient providing contact information and requesting a return call.  ?The care management team will reach out to the patient again over the next 7 days.  ?If patient returns call to provider office, please advise to call [place]  at [phone]. ? ?Laverda Sorenson  ?Care Guide, Embedded Care Coordination ?[facility]  Care Management  ?Direct Dial: [phone] ? ?

## 2021-05-07 ENCOUNTER — Other Ambulatory Visit: Payer: Self-pay | Admitting: Gastroenterology

## 2021-05-10 ENCOUNTER — Telehealth: Payer: Medicare Other

## 2021-05-10 NOTE — Chronic Care Management (AMB) (Signed)
?  Care Management  ? ?Note ? ?05/10/2021 ?Name: [name] MRN: [id number] DOB: 21-May-1955 ? ?[name] is a 66 y.o. year old female who is a primary care patient of [name], [name], MD and is actively engaged with the care management team. I reached out to [name] by phone today to assist with re-scheduling a follow up visit with the RN Case Manager and Licensed Clinical Social Worker ? ?Follow up plan: ?Unsuccessful telephone outreach attempt made. A HIPAA compliant phone message was left for the patient providing contact information and requesting a return call.  ?The care management team will reach out to the patient again over the next 14 days.  ?If patient returns call to provider office, please advise to call [place] at [phone]. ? ?Laverda Sorenson  ?Care Guide, Embedded Care Coordination ?[facility]  Care Management  ?Direct Dial: [phone] ? ?

## 2021-05-18 ENCOUNTER — Ambulatory Visit (HOSPITAL_COMMUNITY)
Admission: RE | Admit: 2021-05-18 | Discharge: 2021-05-18 | Disposition: A | Discharge status: Home / Self Care | Payer: Medicare Other | Source: Ambulatory Visit | Attending: Student | Admitting: Student

## 2021-05-18 DIAGNOSIS — I5022 Chronic systolic (congestive) heart failure: Secondary | ICD-10-CM | POA: Diagnosis not present

## 2021-05-18 LAB — ECHOCARDIOGRAM LIMITED
Calc EF: 39.9 %
S' Lateral: 3.1 cm
Single Plane A2C EF: 49 %
Single Plane A4C EF: 38.1 %

## 2021-05-18 NOTE — Progress Notes (Signed)
*  PRELIMINARY RESULTS* ?Echocardiogram ?Limited 2D Echocardiogram has been performed. ? ?[name] ?05/18/2021, 2:56 PM ?

## 2021-05-21 NOTE — Chronic Care Management (AMB) (Signed)
?  Care Management  ? ?Note ? ?05/21/2021 ?Name: [name] MRN: [id number] DOB: [birth year] ? ?[name] is a 66 y.o. year old female who is a primary care patient of [name], [name], MD and is actively engaged with the care management team. I reached out to [name] by phone today to assist with re-scheduling a follow up visit with the RN Case Manager and Licensed Clinical Social Worker ? ?Follow up plan: ?A third unsuccessful telephone outreach attempt made. A HIPAA compliant phone message was left for the patient providing contact information and requesting a return call.  ?Unable to make contact on outreach attempts x 3. PCP [name], [name], MD notified via routed documentation in medical record. We have been unable to make contact with the patient for follow up. The care management team is available to follow up with the patient after provider conversation with the patient regarding recommendation for care management engagement and subsequent re-referral to the care management team.  ?Laverda Sorenson  ?Care Guide, Embedded Care Coordination ?[facility]  Care Management  ?Direct Dial: [phone] ? ? ?

## 2021-05-27 DIAGNOSIS — J441 Chronic obstructive pulmonary disease with (acute) exacerbation: Secondary | ICD-10-CM | POA: Diagnosis not present

## 2021-05-29 ENCOUNTER — Telehealth: Payer: Self-pay | Admitting: Pharmacist

## 2021-05-29 NOTE — Chronic Care Management (AMB) (Signed)
? ? ?  Chronic Care Management ?Pharmacy Assistant  ? ?Name: [name]  MRN: [id number] DOB: 1955/07/29 ? ?05/29/21 APPOINTMENT REMINDER ? ? ?Called Patient No answer, left message of appointment on 05/30/21 at 3:30 via telephone visit with Jeni Salles, Pharm D.  ? ?Notified to have all medications, supplements, blood pressure and/or blood sugar logs available during appointment and to return call if need to reschedule. ? ? ?Care Gaps: ?Zoster Vaccine - Overdue ?Pap Smear - Overdue ?COVID Booster - Overdue ?Mammogram - Overdue ?AWV- AWV - office aware to schedule as of 11/22 ?BP- ? ?Star Rating Drug: ?Empagliflozin (Jardiance ) 10 mg - Last filled 05/07/21 30 DS At [facility] ?  ?Patient Assistance: ?Breztri ([place] &[place])- Approved for 2023  ? ?Any gaps in medications fill history? ?None ? ? ? ? ? ?Medications: ?Outpatient Encounter Medications as of 05/29/2021  ?Medication Sig  ? albuterol (PROAIR HFA) 108 (90 Base) MCG/ACT inhaler Inhale 2 puffs into the lungs every 4 (four) hours as needed for wheezing or shortness of breath (cough).  ? ALPRAZolam (XANAX) 0.5 MG tablet Take 1 tablet (0.5 mg total) by mouth 3 (three) times daily as needed for anxiety. for anxiety  ? aspirin EC 81 MG tablet Take 81 mg by mouth daily.  ? Budeson-Glycopyrrol-Formoterol (BREZTRI AEROSPHERE) 160-9-4.8 MCG/ACT AERO Inhale 2 puffs into the lungs in the morning and at bedtime.  ? DULoxetine (CYMBALTA) 60 MG capsule Take 1 capsule (60 mg total) by mouth 2 (two) times daily.  ? empagliflozin (JARDIANCE) 10 MG TABS tablet Take 1 tablet (10 mg total) by mouth daily.  ? ezetimibe (ZETIA) 10 MG tablet Take 10 mg by mouth daily.  ? levothyroxine (SYNTHROID) 50 MCG tablet TAKE 1 TABLET IN THE MORNING BEFORE BREAKFAST.  ? midodrine (PROAMATINE) 5 MG tablet TAKE ONE TABLET BY MOUTH 3 TIMES A DAY WITH MEALS  ? mirtazapine (REMERON) 15 MG tablet TAKE (1) TABLET BY MOUTH AT BEDTIME.  ? nicotine (NICODERM CQ - DOSED IN MG/24 HR) 7 mg/24hr patch  Place 1 patch (7 mg total) onto the skin daily.  ? oxyCODONE (OXY IR/ROXICODONE) 5 MG immediate release tablet Take 1 tablet (5 mg total) by mouth every 6 (six) hours as needed for severe pain.  ? pantoprazole (PROTONIX) 40 MG tablet TAKE ONE TABLET BY MOUTH TWICE DAILY BEFORE A MEAL.  ? predniSONE (DELTASONE) 10 MG tablet Take  4 each am x 2 days,   2 each am x 2 days,  1 each am x 2 days and stop  ? pregabalin (LYRICA) 100 MG capsule Take 1 capsule (100 mg total) by mouth 3 (three) times daily.  ? QUEtiapine (SEROQUEL) 50 MG tablet Take 1-2 tablets (50-100 mg total) by mouth at bedtime. TAKE 1 OR 2 TABLETS BY MOUTH AT BEDTIME for sleep  ? spironolactone (ALDACTONE) 25 MG tablet Take 0.5 tablets (12.5 mg total) by mouth daily.  ? torsemide (DEMADEX) 20 MG tablet Take 1 tablet (20 mg total) by mouth daily as needed (As needed for swelling or weight gain).  ? ?No facility-administered encounter medications on file as of 05/29/2021.  ? ? ? ? ?Ned Clines CMA ?Clinical Pharmacist Assistant ?[phone] ? ?

## 2021-05-30 ENCOUNTER — Ambulatory Visit (INDEPENDENT_AMBULATORY_CARE_PROVIDER_SITE_OTHER): Payer: Medicare Other | Admitting: Pharmacist

## 2021-05-30 DIAGNOSIS — I10 Essential (primary) hypertension: Secondary | ICD-10-CM

## 2021-05-30 DIAGNOSIS — J449 Chronic obstructive pulmonary disease, unspecified: Secondary | ICD-10-CM

## 2021-05-30 NOTE — Progress Notes (Signed)
? ?Chronic Care Management ?Pharmacy Note ? ?06/01/2021 ?Name:  [name] MRN:  [id number] DOB:  [birth year] ? ?Summary: ?Pt is not checking BP consistently at home ?Pt is still smoking ? ?Recommendations/Changes made from today's visit: ?-Recommended at least weekly monitoring of BP at home ?-Recommend starting with nicotine 14 mg patches based on current cigarette use and combining with lozenges for higher success rate ?-Provided QUIT line phone number to request patches/lozenges  ?-Recommended discussing with pain management about possible side effects with pregabalin ? ?Plan: ?BP assessment in 1 month ?Follow up in 3 months  ? ?Subjective: ?[name] is an 66 y.o. year old female who is a primary patient of [name], [name], MD.  The CCM team was consulted for assistance with disease management and care coordination needs.   ? ?Engaged with patient by telephone for follow up visit in response to provider referral for pharmacy case management and/or care coordination services.  ? ?Consent to Services:  ?The patient was given information about Chronic Care Management services, agreed to services, and gave verbal consent prior to initiation of services.  Please see initial visit note for detailed documentation.  ? ?Patient Care Team: ?[name], [name], MD as PCP - General (Internal Medicine) ?Satira Sark, MD as PCP - Cardiology (Cardiology) ?Yehuda Savannah, MD (Inactive) (Cardiology) ?Fields, Marga Melnick, MD (Inactive) as Consulting Physician (Gastroenterology) ?Viona Gilmore, [facility] as Pharmacist (Pharmacist) ?Dimitri Ped, RN as Case Manager ? ?Recent office visits: ?04/06/21 Dimitri Ped, RN - Patient presented for [facility] Nurse Visit. ?  ?03/23/21 Rebekah Chesterfield, LCSW - Patient presented for [facility] Social Worker visit.  ?  ?03/09/21 Dimitri Ped, RN - Patient presented for [facility] Nurse Visit. ?  ?08-22-2020 [name], [name], MD - Patient presented for  Hospital discharge follow up and other concerns. No medication changes. ? ?Recent consult visits: ?04/26/21 Benard Rink, PA-C (cardiology): Patient presented for HF follow up. Plan for repeat Echo. ?  ?3/22/2 Tanda Rockers, MD (Pulmonology) - Patient presented for COPD  GOLD II / still smoking, and other concerns. Referred to DME for best fit for portable oxygen. ?  ?03/19/21 Senor, Claudean Kinds, RN - Palliative care home visit. No medication changes. ?  ?03/05/21 Courtney Heys, MD (Phys Med) - Patient presented for Radiculopathy die to lumbar intervertebral disc disorder and other concerns. Changed Alprazolam, Pregabalin & Quetiapine ?  ?02/21/21 Erma Heritage, PA-C (Cardiology) - Patient presented for Chronic systolic heart failure and other concerns. Prescribed Spironolactone. Changed Torsemide. ? ?01/18/21 Satira Sark, MD [facility]) - Patient presented for Pre-Op ?  ?01/17/21 Tanda Rockers, MD (Pulmonology) - Patient presented for Oxygen desaturation and other concerns. Prescribed Azithromycin 250 mg and Prednisone 10 mg. ?  ?01/02/21 Verta Ellen., NP (Cardiac rehab) - Patient presented for Myocardial perfusion. ? ?Hospital visits: ?Patient presented to [facility] on  01/30/21 due to RIGHT/LEFT HEART CATH AND CORONARY ANGIOGRAPHY. Patient was present for 8 hours. ?  ?New?Medications Started at [facility] Discharge:?? ?-started  ?None ?  ?Medication Changes at Hospital Discharge: ?-Changed  ?None ?  ?Medications Discontinued at Hospital Discharge: ?-Stopped  ?azithromycin 250 MG tablet ?  ?Medications that remain the same after Hospital Discharge:??  ?-All other medications will remain the same.   ?  ? ?Objective: ? ?Lab Results  ?Component Value Date  ? CREATININE 1.41 (H) 01/18/2021  ? BUN 19 01/18/2021  ? GFR 35.05 (  L) 08/22/2020  ? GFRNONAA 42 (L) 01/18/2021  ? GFRAA 49 (L) 06/11/2019  ? NA 140 01/30/2021  ? NA 139 01/30/2021  ? K 4.0 01/30/2021  ? K 4.0 01/30/2021  ?  CALCIUM 9.6 01/18/2021  ? CO2 40 (H) 01/18/2021  ? GLUCOSE 140 (H) 01/18/2021  ? ? ?Lab Results  ?Component Value Date/Time  ? GFR 35.05 (L) 08/22/2020 04:05 PM  ? GFR 52.48 (L) 12/08/2018 03:36 PM  ?  ?Last diabetic Eye exam: No results found for: HMDIABEYEEXA  ?Last diabetic Foot exam: No results found for: HMDIABFOOTEX  ? ?Lab Results  ?Component Value Date  ? CHOL 129 07/28/2020  ? HDL 28 (L) 07/28/2020  ? [place] 79 07/28/2020  ? TRIG 108 07/28/2020  ? CHOLHDL 4.6 07/28/2020  ? ? ? ?  Latest Ref Rng & Units 08/22/2020  ?  4:05 PM 07/26/2020  ?  1:29 PM 06/11/2019  ?  3:30 PM  ?Hepatic Function  ?Total Protein 6.0 - 8.3 g/dL 7.4   6.5   5.8    ?Albumin 3.5 - 5.2 g/dL 4.1   3.3   2.7    ?AST 0 - 37 U/L 19   39   13    ?ALT 0 - 35 U/L _0 ?Alk Phosphatase 39 - 117 U/L 78   135   90    ?Total Bilirubin 0.2 - 1.2 mg/dL 0.6   2.1   0.6    ? ? ?Lab Results  ?Component Value Date/Time  ? TSH 1.240 11/17/2020 02:10 PM  ? TSH 0.336 (L) 07/28/2020 01:25 AM  ? TSH 3.220 05/31/2019 11:52 AM  ? FREET4 0.87 07/28/2020 10:57 AM  ? ? ? ?  Latest Ref Rng & Units 01/30/2021  ?  8:06 AM 01/30/2021  ?  7:55 AM 01/18/2021  ?  3:12 PM  ?CBC  ?WBC 4.0 - 10.5 K/uL   9.9    ?Hemoglobin 12.0 - 15.0 g/dL 12.6    ? 12.6   12.9   13.4    ?Hematocrit 36.0 - 46.0 % 37.0    ? 37.0   38.0   43.0    ?Platelets 150 - 400 K/uL   284    ? ? ?No results found for: VD25OH ? ?Clinical ASCVD: Yes  ?The ASCVD Risk score (Arnett DK, et al., 2019) failed to calculate for the following reasons: ?  The valid total cholesterol range is 130 to 320 mg/dL   ? ? ?  03/05/2021  ?  1:22 PM 12/05/2020  ?  5:57 PM 09/26/2020  ?  1:15 PM  ?Depression screen PHQ 2/9  ?Decreased Interest 1 0 0  ?Down, Depressed, Hopeless _1 ?PHQ - 2 Score _2 ?  ? ?Social History  ? ?Tobacco Use  ?Smoking Status Former  ? Packs/day: 0.25  ? Years: 40.00  ? Pack years: 10.00  ? Types: Cigarettes  ? Start date: 09/06/1972  ? Quit date: 04/12/2021  ? Years since quitting: 0.1  ?  Passive exposure: Current  ?Smokeless Tobacco Never  ?Tobacco Comments  ? Patient has 1-2 cigs a day and trying to quit.   ? ?BP Readings from Last 3 Encounters:  ?04/26/21 132/74  ?04/25/21 116/70  ?03/05/21 102/68  ? ?Pulse Readings from Last 3 Encounters:  ?04/26/21 84  ?04/25/21 68  ?03/05/21 88  ? ?Wt Readings from Last 3 Encounters:  ?04/26/21 119 lb (54  kg)  ?04/25/21 117 lb 3.2 oz (53.2 kg)  ?03/05/21 118 lb (53.5 kg)  ? ?BMI Readings from Last 3 Encounters:  ?04/26/21 21.77 kg/m?  ?04/25/21 21.44 kg/m?  ?03/05/21 21.58 kg/m?  ? ? ?Assessment/Interventions: Review of patient past medical history, allergies, medications, health status, including review of consultants reports, laboratory and other test data, was performed as part of comprehensive evaluation and provision of chronic care management services.  ? ?SDOH:  (Social Determinants of Health) assessments and interventions performed: Yes ? ? ?SDOH Screenings  ? ?Alcohol Screen: Low Risk   ? Last Alcohol Screening Score (AUDIT): 1  ?Depression (PHQ2-9): Low Risk   ? PHQ-2 Score: 2  ?Financial Resource Strain: Low Risk   ? Difficulty of Paying Living Expenses: Not very hard  ?Food Insecurity: No Food Insecurity  ? Worried About Charity fundraiser in the Last Year: Never true  ? Ran Out of Food in the Last Year: Never true  ?Housing: Low Risk   ? Last Housing Risk Score: 0  ?Physical Activity: Inactive  ? Days of Exercise per Week: 0 days  ? Minutes of Exercise per Session: 0 min  ?Social Connections: Socially Integrated  ? Frequency of Communication with Friends and Family: More than three times a week  ? Frequency of Social Gatherings with Friends and Family: More than three times a week  ? Attends Religious Services: More than 4 times per year  ? Active Member of Clubs or Organizations: Yes  ? Attends Archivist Meetings: 1 to 4 times per year  ? Marital Status: Married  ?Stress: No Stress Concern Present  ? Feeling of Stress : Only a little   ?Tobacco Use: Medium Risk  ? Smoking Tobacco Use: Former  ? Smokeless Tobacco Use: Never  ? Passive Exposure: Current  ?Transportation Needs: No Transportation Needs  ? Lack of Transportation (Medical): No

## 2021-06-03 DIAGNOSIS — I10 Essential (primary) hypertension: Secondary | ICD-10-CM | POA: Diagnosis not present

## 2021-06-03 DIAGNOSIS — I5020 Unspecified systolic (congestive) heart failure: Secondary | ICD-10-CM | POA: Diagnosis not present

## 2021-06-03 DIAGNOSIS — F17210 Nicotine dependence, cigarettes, uncomplicated: Secondary | ICD-10-CM | POA: Diagnosis not present

## 2021-06-03 DIAGNOSIS — E785 Hyperlipidemia, unspecified: Secondary | ICD-10-CM

## 2021-06-03 DIAGNOSIS — F32A Depression, unspecified: Secondary | ICD-10-CM

## 2021-06-03 DIAGNOSIS — E039 Hypothyroidism, unspecified: Secondary | ICD-10-CM | POA: Diagnosis not present

## 2021-06-03 DIAGNOSIS — J449 Chronic obstructive pulmonary disease, unspecified: Secondary | ICD-10-CM

## 2021-06-04 ENCOUNTER — Encounter: Payer: Self-pay | Admitting: Physical Medicine and Rehabilitation

## 2021-06-04 ENCOUNTER — Encounter
Payer: Medicare Other | Attending: Physical Medicine and Rehabilitation | Admitting: Physical Medicine and Rehabilitation

## 2021-06-04 ENCOUNTER — Telehealth: Payer: Self-pay

## 2021-06-04 VITALS — BP 117/64 | HR 80

## 2021-06-04 DIAGNOSIS — Z79891 Long term (current) use of opiate analgesic: Secondary | ICD-10-CM | POA: Diagnosis not present

## 2021-06-04 DIAGNOSIS — G894 Chronic pain syndrome: Secondary | ICD-10-CM | POA: Diagnosis not present

## 2021-06-04 DIAGNOSIS — Z5181 Encounter for therapeutic drug level monitoring: Secondary | ICD-10-CM | POA: Diagnosis not present

## 2021-06-04 MED ORDER — PANTOPRAZOLE SODIUM 40 MG PO TBEC: DELAYED_RELEASE_TABLET | ORAL | 0 refills | Status: AC

## 2021-06-04 MED ORDER — ALPRAZOLAM 0.5 MG PO TABS: 0.5000 mg | ORAL_TABLET | Freq: Three times a day (TID) | ORAL | 5 refills | Status: AC | PRN

## 2021-06-04 MED ORDER — OXYCODONE HCL 5 MG PO TABS: 5.0000 mg | ORAL_TABLET | Freq: Four times a day (QID) | ORAL | 0 refills | Status: AC | PRN

## 2021-06-04 MED ORDER — ALBUTEROL SULFATE (2.5 MG/3ML) 0.083% IN NEBU: 2.5000 mg | INHALATION_SOLUTION | RESPIRATORY_TRACT | 11 refills | Status: AC | PRN

## 2021-06-04 NOTE — Progress Notes (Signed)
? ?Subjective:  ? ? Patient ID: [name], female    DOB: Oct 01, 1955, 66 y.o.   MRN: [id number] ? ?HPI ?Patient came in for appt today and SpO2 59% on 3 liters of oxygen Dr. Dagoberto Ligas turned it up 6 liters and SpO2 got to 77%. Patient refused to go to the hospital as Dr. Dagoberto Ligas advised her to. ? ?Pt said needs Oral drug screen ?Absolutely refused needs to go to hospital- no matter what- ?Her sats 57-59% on 3L; only came up to 6L up to 74-75% but no better- HR went from 45 to 140 literally ? ?  ? ?Pain Inventory ?Average Pain 7 ?Pain Right Now 5 ?My pain is sharp, burning, and stabbing ? ?In the last 24 hours, has pain interfered with the following? ?General activity 7 ?Relation with others 7 ?Enjoyment of life 7 ?What TIME of day is your pain at its worst? morning  ?Sleep (in general) Poor ? ?Pain is worse with: bending, sitting, standing, and some activites ?Pain improves with: rest, medication, and TENS ?Relief from Meds: 8 ? ?Family History  ?Problem Relation Age of Onset  ? Colon polyps Mother   ? Deep vein thrombosis Mother   ? Heart disease Mother   ? Hypertension Mother   ? Bleeding Disorder Mother   ? Hypertension Sister   ? Heart disease Brother   ?     before age 57  ? Hypertension Brother   ? ?Social History  ? ?Socioeconomic History  ? Marital status: Married  ?  Spouse name: Jaina Morin  ? Number of children: 2  ? Years of education: 81  ? Highest education level: 12th grade  ?Occupational History  ?  Comment: Disabled  ?Tobacco Use  ? Smoking status: Former  ?  Packs/day: 0.25  ?  Years: 40.00  ?  Pack years: 10.00  ?  Types: Cigarettes  ?  Start date: 09/06/1972  ?  Quit date: 04/12/2021  ?  Years since quitting: 0.1  ?  Passive exposure: Current  ? Smokeless tobacco: Never  ? Tobacco comments:  ?  Patient has 1-2 cigs a day and trying to quit.   ?Vaping Use  ? Vaping Use: Never used  ?Substance and Sexual Activity  ? Alcohol use: No  ?  Alcohol/week: 0.0 standard drinks  ? Drug use: No  ? Sexual  activity: Not Currently  ?  Birth control/protection: Surgical  ?Other Topics Concern  ? Not on file  ?Social History Narrative  ? Not on file  ? ?Social Determinants of Health  ? ?Financial Resource Strain: Low Risk   ? Difficulty of Paying Living Expenses: Not very hard  ?Food Insecurity: No Food Insecurity  ? Worried About Charity fundraiser in the Last Year: Never true  ? Ran Out of Food in the Last Year: Never true  ?Transportation Needs: No Transportation Needs  ? Lack of Transportation (Medical): No  ? Lack of Transportation (Non-Medical): No  ?Physical Activity: Inactive  ? Days of Exercise per Week: 0 days  ? Minutes of Exercise per Session: 0 min  ?Stress: No Stress Concern Present  ? Feeling of Stress : Only a little  ?Social Connections: Socially Integrated  ? Frequency of Communication with Friends and Family: More than three times a week  ? Frequency of Social Gatherings with Friends and Family: More than three times a week  ? Attends Religious Services: More than 4 times per year  ? Active Member of Clubs  or Organizations: Yes  ? Attends Archivist Meetings: 1 to 4 times per year  ? Marital Status: Married  ? ?Past Surgical History:  ?Procedure Laterality Date  ? ABDOMINAL HYSTERECTOMY    ? without oophorectomy for neoplastic disease  ? BACK SURGERY    ? BIOPSY  10/22/2018  ? Procedure: BIOPSY;  Surgeon: Danie Binder, MD;  Location: [facility];  Service: Endoscopy;;  duodenal, gastric  ? CARDIAC CATHETERIZATION  07/2013  ? normal coronary arteries  ? CARPAL TUNNEL RELEASE    ? Left  ? CHOLECYSTECTOMY  2007  ? DILATION AND CURETTAGE OF UTERUS    ? ESOPHAGOGASTRODUODENOSCOPY (EGD) WITH PROPOFOL N/A 10/22/2018  ? gastritis (mild reactive gastropathy, negative duodenal biopsies for celiac).   ? LAPAROTOMY    ? LEFT HEART CATHETERIZATION WITH CORONARY ANGIOGRAM N/A 07/12/2013  ? Procedure: LEFT HEART CATHETERIZATION WITH CORONARY ANGIOGRAM;  Surgeon: Leonie Man, MD;  Location: [facility] CATH LAB;   Service: Cardiovascular;  Laterality: N/A;  ? LUMBAR LAMINECTOMY  x3  ? Left; complicated by neurologic dysfunction  ? NASAL SINUS SURGERY    ? RIGHT/LEFT HEART CATH AND CORONARY ANGIOGRAPHY N/A 01/30/2021  ? Procedure: RIGHT/LEFT HEART CATH AND CORONARY ANGIOGRAPHY;  Surgeon: Jettie Booze, MD;  Location: [facility];  Service: Cardiovascular;  Laterality: N/A;  ? ?Past Surgical History:  ?Procedure Laterality Date  ? ABDOMINAL HYSTERECTOMY    ? without oophorectomy for neoplastic disease  ? BACK SURGERY    ? BIOPSY  10/22/2018  ? Procedure: BIOPSY;  Surgeon: Danie Binder, MD;  Location: [facility];  Service: Endoscopy;;  duodenal, gastric  ? CARDIAC CATHETERIZATION  07/2013  ? normal coronary arteries  ? CARPAL TUNNEL RELEASE    ? Left  ? CHOLECYSTECTOMY  2007  ? DILATION AND CURETTAGE OF UTERUS    ? ESOPHAGOGASTRODUODENOSCOPY (EGD) WITH PROPOFOL N/A 10/22/2018  ? gastritis (mild reactive gastropathy, negative duodenal biopsies for celiac).   ? LAPAROTOMY    ? LEFT HEART CATHETERIZATION WITH CORONARY ANGIOGRAM N/A 07/12/2013  ? Procedure: LEFT HEART CATHETERIZATION WITH CORONARY ANGIOGRAM;  Surgeon: Leonie Man, MD;  [facility];  Service: Cardiovascular;  Laterality: N/A;  ? LUMBAR LAMINECTOMY  x3  ? Left; complicated by neurologic dysfunction  ? NASAL SINUS SURGERY    ? RIGHT/LEFT HEART CATH AND CORONARY ANGIOGRAPHY N/A 01/30/2021  ? Procedure: RIGHT/LEFT HEART CATH AND CORONARY ANGIOGRAPHY;  Surgeon: Jettie Booze, MD;  Location: [place] [facility];  Service: Cardiovascular;  Laterality: N/A;  ? ?Past Medical History:  ?Diagnosis Date  ? Anemia   ? Anxiety and depression   ? Bronchitis   ? CHF (congestive heart failure) ([place])   ? a. EF 30-35% in 07/2020 and similar by repeat imaging in 11/2020  ? Chronic pain   ? COPD (chronic obstructive pulmonary disease) ([place])   ? Gastroesophageal reflux disease   ? History of cardiac catheterization   ? a. Normal coronary arteries 2015  b. cath in 01/2021 showing only 25% proximal LCx stenosis  ? Hyperlipidemia   ? Hypertension   ? Hypothyroid   ? Migraine headache   ? Near syncope   ? On home oxygen therapy   ? Started 08/2018  ? Peripheral vascular disease ([place])   ? Pneumonia   ? Secondary cardiomyopathy ([place])   ? Tobacco abuse   ? 1/2 pack per day  ? Uterine cancer ([place])   ? ?BP 117/64   Pulse 80  SpO2 (!) 59%  ? ?Opioid Risk Score:   ?Fall Risk Score:  `1 ? ?Depression screen PHQ 2/9 ? ? ?  03/05/2021  ?  1:22 PM 12/05/2020  ?  5:57 PM 09/26/2020  ?  1:15 PM 07/26/2020  ? 10:55 AM 04/07/2020  ?  3:28 PM 09/17/2018  ?  1:11 PM  ?Depression screen PHQ 2/9  ?Decreased Interest 1 0 0 1 1 0  ?Down, Depressed, Hopeless '1 1 1 1 1 '$ 0  ?PHQ - 2 Score '2 1 1 2 2 '$ 0  ?Altered sleeping      0  ?Tired, decreased energy      0  ?Change in appetite      1  ?Feeling bad or failure about yourself       0  ?Trouble concentrating      0  ?Moving slowly or fidgety/restless      0  ?Suicidal thoughts      0  ?PHQ-9 Score      1  ?Difficult doing work/chores      Not difficult at all  ?  ? ?Review of Systems  ?Musculoskeletal:  Positive for back pain.  ?     Back of leg pain  ?All other systems reviewed and are negative. ? ?   ?Objective:  ? Physical Exam ? ?Awake, but blue lips- very pale- delayed responses, NAD ?Was shaking, which I felt was due to low O2 sats.  ?Heart rate extremely variable, however didn't sound like in Afib ?Lungs full of rhonchi and wheezing ? ?   ?Assessment & Plan:  ? ? ?Advised pt 5-6 x to to to ER/let us call 911. Pt refused every time.  ?Will send in Albuterol nebs- she has machine at home but no nebs.  ?3. Asked her to call PCP ASAP  ?Sent in Protonix- 1 month for pt- she had run out- ?Has refills on Aldactone explained she cannot run out- helps keep fluid in her lungs under control.  ?Sent in pain and Xanax meds- ?7. Oral drug screen since she insists to do today  ?F/U in 3 months ? ? ?I spent a total of 36   minutes on total care today- >50%  coordination of care- due to  high complexity- wa sin room for 36 minutes and then also directed staff for another 10 minutes.  ?

## 2021-06-04 NOTE — Telephone Encounter (Signed)
Documented in clinic note. Patient came in for appt today and SpO2 59% on 3 liters of oxygen Dr. Dagoberto Ligas turned it up 6 liters and SpO2 got to 77%. Patient refused to go to the hospital as Dr. Dagoberto Ligas advised her to. ?

## 2021-06-09 LAB — DRUG TOX MONITOR 1 W/CONF, ORAL FLD
Alprazolam: 6.34 ng/mL — ABNORMAL HIGH (ref <0.50)
Amphetamines: NEGATIVE ng/mL (ref <10)
Barbiturates: NEGATIVE ng/mL (ref <10)
Benzodiazepines: POSITIVE ng/mL — AB (ref <0.50)
Buprenorphine: NEGATIVE ng/mL (ref <0.10)
Chlordiazepoxide: NEGATIVE ng/mL (ref <0.50)
Clonazepam: NEGATIVE ng/mL (ref <0.50)
Cocaine: NEGATIVE ng/mL (ref <5.0)
Codeine: NEGATIVE ng/mL (ref <2.5)
Cotinine: >250 ng/mL — ABNORMAL HIGH (ref <5.0)
Diazepam: NEGATIVE ng/mL (ref <0.50)
Dihydrocodeine: NEGATIVE ng/mL (ref <2.5)
Fentanyl: NEGATIVE ng/mL (ref <0.10)
Flunitrazepam: NEGATIVE ng/mL (ref <0.50)
Flurazepam: NEGATIVE ng/mL (ref <0.50)
Heroin Metabolite: NEGATIVE ng/mL (ref <1.0)
Hydrocodone: NEGATIVE ng/mL (ref <2.5)
Hydromorphone: NEGATIVE ng/mL (ref <2.5)
Lorazepam: NEGATIVE ng/mL (ref <0.50)
MARIJUANA: NEGATIVE ng/mL (ref <2.5)
MDMA: NEGATIVE ng/mL (ref <10)
Meprobamate: NEGATIVE ng/mL (ref <2.5)
Methadone: NEGATIVE ng/mL (ref <5.0)
Midazolam: NEGATIVE ng/mL (ref <0.50)
Morphine: NEGATIVE ng/mL (ref <2.5)
Nicotine Metabolite: POSITIVE ng/mL — AB (ref <5.0)
Nordiazepam: NEGATIVE ng/mL (ref <0.50)
Norhydrocodone: NEGATIVE ng/mL (ref <2.5)
Noroxycodone: 243.6 ng/mL — ABNORMAL HIGH (ref <2.5)
Opiates: POSITIVE ng/mL — AB (ref <2.5)
Oxazepam: NEGATIVE ng/mL (ref <0.50)
Oxycodone: >250 ng/mL — ABNORMAL HIGH (ref <2.5)
Oxymorphone: 6.3 ng/mL — ABNORMAL HIGH (ref <2.5)
Phencyclidine: NEGATIVE ng/mL (ref <10)
Tapentadol: NEGATIVE ng/mL (ref <5.0)
Temazepam: NEGATIVE ng/mL (ref <0.50)
Tramadol: NEGATIVE ng/mL (ref <5.0)
Triazolam: NEGATIVE ng/mL (ref <0.50)
Zolpidem: NEGATIVE ng/mL (ref <5.0)

## 2021-06-09 LAB — DRUG TOX ALC METAB W/CON, ORAL FLD: Alcohol Metabolite: NEGATIVE ng/mL (ref <25)

## 2021-06-13 ENCOUNTER — Ambulatory Visit: Payer: Medicare Other | Admitting: Internal Medicine

## 2021-06-14 ENCOUNTER — Telehealth: Payer: Self-pay | Admitting: *Deleted

## 2021-06-14 NOTE — Telephone Encounter (Addendum)
Oral swab drug screen was consistent for prescribed medications.  ?

## 2021-06-18 ENCOUNTER — Telehealth: Payer: Self-pay | Admitting: Physical Medicine and Rehabilitation

## 2021-06-18 DIAGNOSIS — Z743 Need for continuous supervision: Secondary | ICD-10-CM | POA: Diagnosis not present

## 2021-06-18 DIAGNOSIS — R4020 Unspecified coma: Secondary | ICD-10-CM | POA: Diagnosis not present

## 2021-06-18 DIAGNOSIS — I469 Cardiac arrest, cause unspecified: Secondary | ICD-10-CM | POA: Diagnosis not present

## 2021-06-18 DIAGNOSIS — R404 Transient alteration of awareness: Secondary | ICD-10-CM | POA: Diagnosis not present

## 2021-07-05 DIAGNOSIS — 419620001 Death: Secondary | SNOMED CT | POA: Diagnosis not present

## 2021-07-05 NOTE — Telephone Encounter (Signed)
Received a call from [facility] patient has passed away this morning  ?

## 2021-07-05 DEATH — deceased

## 2021-08-28 ENCOUNTER — Ambulatory Visit: Payer: Medicare Other | Admitting: Student

## 2021-09-04 ENCOUNTER — Telehealth: Payer: Medicare Other

## 2021-09-24 ENCOUNTER — Ambulatory Visit: Payer: Medicare Other | Admitting: Physical Medicine and Rehabilitation
